# Patient Record
Sex: Male | Born: 1935 | Race: White | Hispanic: No | Smoking: Never smoker
Health system: Southern US, Community
[De-identification: ages and names within clinical notes are randomized; demographics above are authoritative.]

## PROBLEM LIST (undated history)

## (undated) DIAGNOSIS — L97509 Non-pressure chronic ulcer of other part of unspecified foot with unspecified severity: Secondary | ICD-10-CM

## (undated) DIAGNOSIS — D696 Thrombocytopenia, unspecified: Secondary | ICD-10-CM

## (undated) DIAGNOSIS — E039 Hypothyroidism, unspecified: Secondary | ICD-10-CM

## (undated) DIAGNOSIS — I4891 Unspecified atrial fibrillation: Secondary | ICD-10-CM

## (undated) DIAGNOSIS — I9789 Other postprocedural complications and disorders of the circulatory system, not elsewhere classified: Secondary | ICD-10-CM

## (undated) DIAGNOSIS — I509 Heart failure, unspecified: Secondary | ICD-10-CM

## (undated) DIAGNOSIS — R0600 Dyspnea, unspecified: Secondary | ICD-10-CM

## (undated) DIAGNOSIS — Z9289 Personal history of other medical treatment: Secondary | ICD-10-CM

## (undated) DIAGNOSIS — D649 Anemia, unspecified: Secondary | ICD-10-CM

## (undated) DIAGNOSIS — E785 Hyperlipidemia, unspecified: Secondary | ICD-10-CM

## (undated) DIAGNOSIS — N184 Chronic kidney disease, stage 4 (severe): Secondary | ICD-10-CM

## (undated) DIAGNOSIS — I2510 Atherosclerotic heart disease of native coronary artery without angina pectoris: Secondary | ICD-10-CM

## (undated) DIAGNOSIS — M1990 Unspecified osteoarthritis, unspecified site: Secondary | ICD-10-CM

## (undated) DIAGNOSIS — I779 Disorder of arteries and arterioles, unspecified: Secondary | ICD-10-CM

## (undated) DIAGNOSIS — I219 Acute myocardial infarction, unspecified: Secondary | ICD-10-CM

## (undated) DIAGNOSIS — I499 Cardiac arrhythmia, unspecified: Secondary | ICD-10-CM

## (undated) DIAGNOSIS — I10 Essential (primary) hypertension: Secondary | ICD-10-CM

## (undated) DIAGNOSIS — I739 Peripheral vascular disease, unspecified: Secondary | ICD-10-CM

## (undated) HISTORY — DX: Other postprocedural complications and disorders of the circulatory system, not elsewhere classified: I48.91

## (undated) HISTORY — DX: Essential (primary) hypertension: I10

## (undated) HISTORY — DX: Unspecified atrial fibrillation: I97.89

## (undated) HISTORY — DX: Peripheral vascular disease, unspecified: I73.9

## (undated) HISTORY — PX: OTHER SURGICAL HISTORY: SHX169

## (undated) HISTORY — DX: Thrombocytopenia, unspecified: D69.6

## (undated) HISTORY — DX: Hyperlipidemia, unspecified: E78.5

## (undated) HISTORY — DX: Non-pressure chronic ulcer of other part of unspecified foot with unspecified severity: L97.509

## (undated) HISTORY — DX: Disorder of arteries and arterioles, unspecified: I77.9

## (undated) HISTORY — DX: Atherosclerotic heart disease of native coronary artery without angina pectoris: I25.10

## (undated) HISTORY — DX: Chronic kidney disease, stage 4 (severe): N18.4

## (undated) HISTORY — DX: Anemia, unspecified: D64.9

---

## 2001-10-27 ENCOUNTER — Inpatient Hospital Stay (HOSPITAL_COMMUNITY): Admission: AD | Admit: 2001-10-27 | Discharge: 2001-10-28 | Payer: Self-pay | Admitting: Cardiology

## 2003-05-25 ENCOUNTER — Encounter: Payer: Self-pay | Admitting: Emergency Medicine

## 2003-05-25 ENCOUNTER — Inpatient Hospital Stay (HOSPITAL_COMMUNITY): Admission: EM | Admit: 2003-05-25 | Discharge: 2003-05-26 | Payer: Self-pay | Admitting: Emergency Medicine

## 2004-02-14 ENCOUNTER — Emergency Department (HOSPITAL_COMMUNITY): Admission: EM | Admit: 2004-02-14 | Discharge: 2004-02-14 | Payer: Self-pay | Admitting: Physical Therapy

## 2005-07-31 ENCOUNTER — Ambulatory Visit: Payer: Self-pay | Admitting: Cardiology

## 2007-04-22 ENCOUNTER — Ambulatory Visit: Payer: Self-pay | Admitting: Cardiology

## 2007-05-06 ENCOUNTER — Ambulatory Visit: Payer: Self-pay | Admitting: *Deleted

## 2007-06-03 ENCOUNTER — Ambulatory Visit: Payer: Self-pay | Admitting: *Deleted

## 2007-12-02 ENCOUNTER — Ambulatory Visit: Payer: Self-pay | Admitting: *Deleted

## 2007-12-24 ENCOUNTER — Inpatient Hospital Stay (HOSPITAL_COMMUNITY): Admission: RE | Admit: 2007-12-24 | Discharge: 2007-12-25 | Payer: Self-pay | Admitting: *Deleted

## 2007-12-24 ENCOUNTER — Ambulatory Visit: Payer: Self-pay | Admitting: *Deleted

## 2007-12-24 ENCOUNTER — Encounter (INDEPENDENT_AMBULATORY_CARE_PROVIDER_SITE_OTHER): Payer: Self-pay | Admitting: *Deleted

## 2008-01-06 ENCOUNTER — Ambulatory Visit: Payer: Self-pay | Admitting: *Deleted

## 2008-09-20 ENCOUNTER — Ambulatory Visit: Payer: Self-pay | Admitting: Cardiology

## 2008-12-15 DIAGNOSIS — I2510 Atherosclerotic heart disease of native coronary artery without angina pectoris: Secondary | ICD-10-CM

## 2008-12-15 DIAGNOSIS — I219 Acute myocardial infarction, unspecified: Secondary | ICD-10-CM

## 2008-12-15 HISTORY — PX: CORONARY ARTERY BYPASS GRAFT: SHX141

## 2008-12-15 HISTORY — DX: Atherosclerotic heart disease of native coronary artery without angina pectoris: I25.10

## 2008-12-15 HISTORY — DX: Acute myocardial infarction, unspecified: I21.9

## 2009-05-01 ENCOUNTER — Inpatient Hospital Stay (HOSPITAL_COMMUNITY): Admission: EM | Admit: 2009-05-01 | Discharge: 2009-05-13 | Payer: Self-pay | Admitting: Cardiology

## 2009-05-01 ENCOUNTER — Ambulatory Visit: Payer: Self-pay | Admitting: Cardiovascular Disease

## 2009-05-01 ENCOUNTER — Encounter: Payer: Self-pay | Admitting: Cardiology

## 2009-05-03 ENCOUNTER — Ambulatory Visit: Payer: Self-pay | Admitting: Cardiothoracic Surgery

## 2009-05-04 ENCOUNTER — Encounter: Payer: Self-pay | Admitting: Cardiothoracic Surgery

## 2009-05-08 ENCOUNTER — Encounter: Payer: Self-pay | Admitting: Cardiothoracic Surgery

## 2009-05-10 ENCOUNTER — Encounter: Payer: Self-pay | Admitting: Cardiothoracic Surgery

## 2009-06-01 ENCOUNTER — Ambulatory Visit: Payer: Self-pay | Admitting: Cardiothoracic Surgery

## 2009-06-01 ENCOUNTER — Encounter: Admission: RE | Admit: 2009-06-01 | Discharge: 2009-06-01 | Payer: Self-pay | Admitting: Cardiothoracic Surgery

## 2009-06-13 ENCOUNTER — Ambulatory Visit: Payer: Self-pay | Admitting: Cardiology

## 2009-06-14 ENCOUNTER — Encounter: Payer: Self-pay | Admitting: Cardiology

## 2009-06-15 ENCOUNTER — Ambulatory Visit: Payer: Self-pay | Admitting: Cardiothoracic Surgery

## 2009-08-08 ENCOUNTER — Encounter: Payer: Self-pay | Admitting: Physician Assistant

## 2009-08-13 ENCOUNTER — Encounter: Payer: Self-pay | Admitting: Physician Assistant

## 2009-08-14 ENCOUNTER — Telehealth: Payer: Self-pay | Admitting: Physician Assistant

## 2009-08-15 DIAGNOSIS — I4891 Unspecified atrial fibrillation: Secondary | ICD-10-CM

## 2009-08-15 DIAGNOSIS — I25810 Atherosclerosis of coronary artery bypass graft(s) without angina pectoris: Secondary | ICD-10-CM

## 2009-08-15 DIAGNOSIS — E785 Hyperlipidemia, unspecified: Secondary | ICD-10-CM

## 2009-08-15 DIAGNOSIS — I10 Essential (primary) hypertension: Secondary | ICD-10-CM | POA: Insufficient documentation

## 2009-08-19 ENCOUNTER — Encounter: Payer: Self-pay | Admitting: Cardiology

## 2009-08-19 ENCOUNTER — Inpatient Hospital Stay (HOSPITAL_COMMUNITY): Admission: EM | Admit: 2009-08-19 | Discharge: 2009-08-21 | Payer: Self-pay | Admitting: Internal Medicine

## 2009-08-19 ENCOUNTER — Ambulatory Visit: Payer: Self-pay | Admitting: Internal Medicine

## 2009-08-20 ENCOUNTER — Encounter: Payer: Self-pay | Admitting: Internal Medicine

## 2009-08-21 ENCOUNTER — Encounter: Payer: Self-pay | Admitting: Cardiology

## 2009-08-24 ENCOUNTER — Ambulatory Visit: Payer: Self-pay | Admitting: Cardiology

## 2009-08-24 DIAGNOSIS — E669 Obesity, unspecified: Secondary | ICD-10-CM | POA: Insufficient documentation

## 2009-08-29 ENCOUNTER — Encounter: Payer: Self-pay | Admitting: Cardiology

## 2009-09-10 ENCOUNTER — Encounter: Payer: Self-pay | Admitting: Cardiology

## 2009-09-14 ENCOUNTER — Encounter (INDEPENDENT_AMBULATORY_CARE_PROVIDER_SITE_OTHER): Payer: Self-pay | Admitting: *Deleted

## 2009-09-17 ENCOUNTER — Encounter: Payer: Self-pay | Admitting: Cardiology

## 2009-09-18 ENCOUNTER — Encounter: Payer: Self-pay | Admitting: Cardiology

## 2009-09-20 ENCOUNTER — Encounter: Payer: Self-pay | Admitting: Cardiology

## 2009-09-24 ENCOUNTER — Encounter: Payer: Self-pay | Admitting: Cardiology

## 2009-09-25 ENCOUNTER — Ambulatory Visit: Payer: Self-pay | Admitting: Cardiology

## 2009-10-08 ENCOUNTER — Encounter: Payer: Self-pay | Admitting: Cardiology

## 2009-10-25 ENCOUNTER — Encounter: Payer: Self-pay | Admitting: Cardiology

## 2009-10-26 ENCOUNTER — Encounter: Payer: Self-pay | Admitting: Cardiology

## 2009-12-29 ENCOUNTER — Encounter: Payer: Self-pay | Admitting: Cardiology

## 2009-12-30 ENCOUNTER — Encounter: Payer: Self-pay | Admitting: Cardiology

## 2010-04-16 IMAGING — CR DG CHEST 2V
2 series · 2 of 2 positions shown · non-contrast
Comparison: 05/09/2009

CLINICAL DATA: Postop CABG procedure

CHEST - 2 VIEW

[w chest pa]
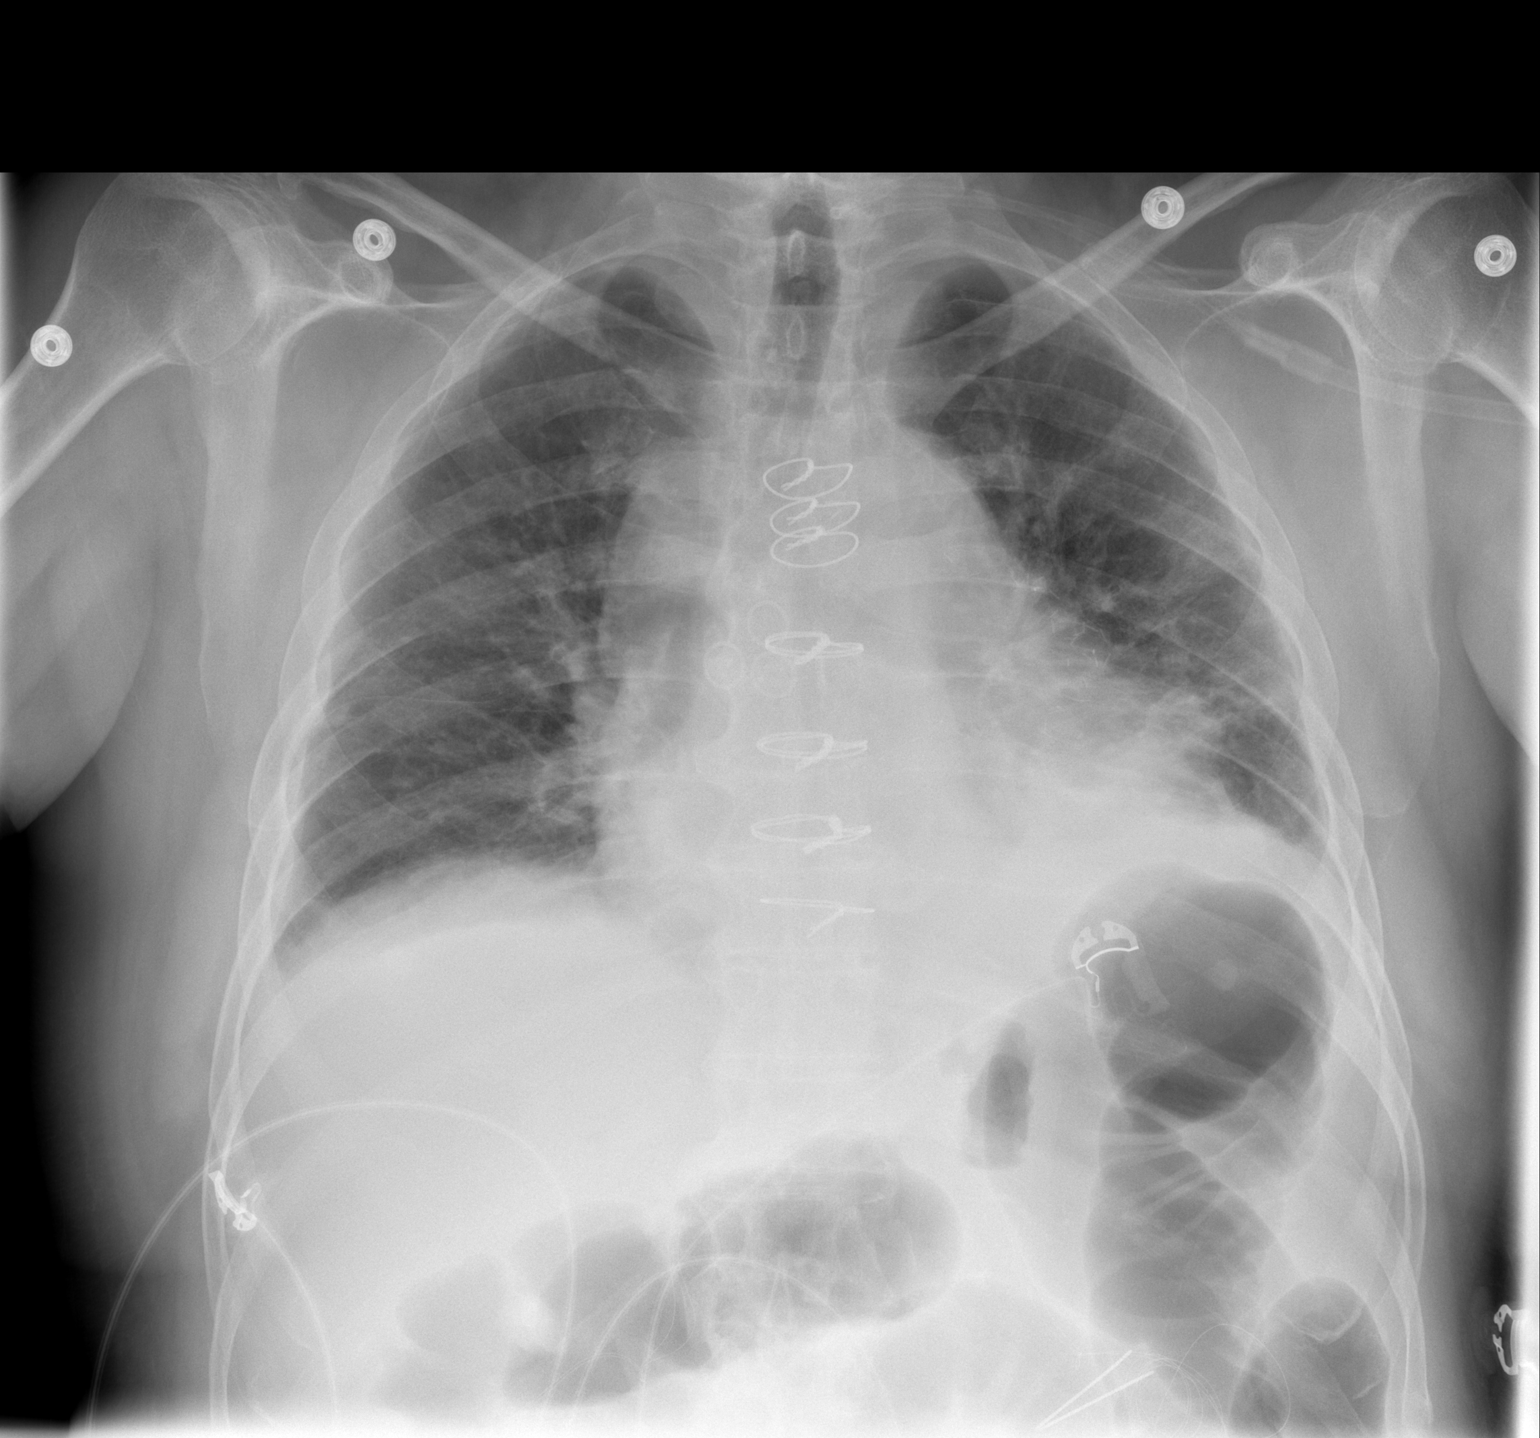

[w chest lat]
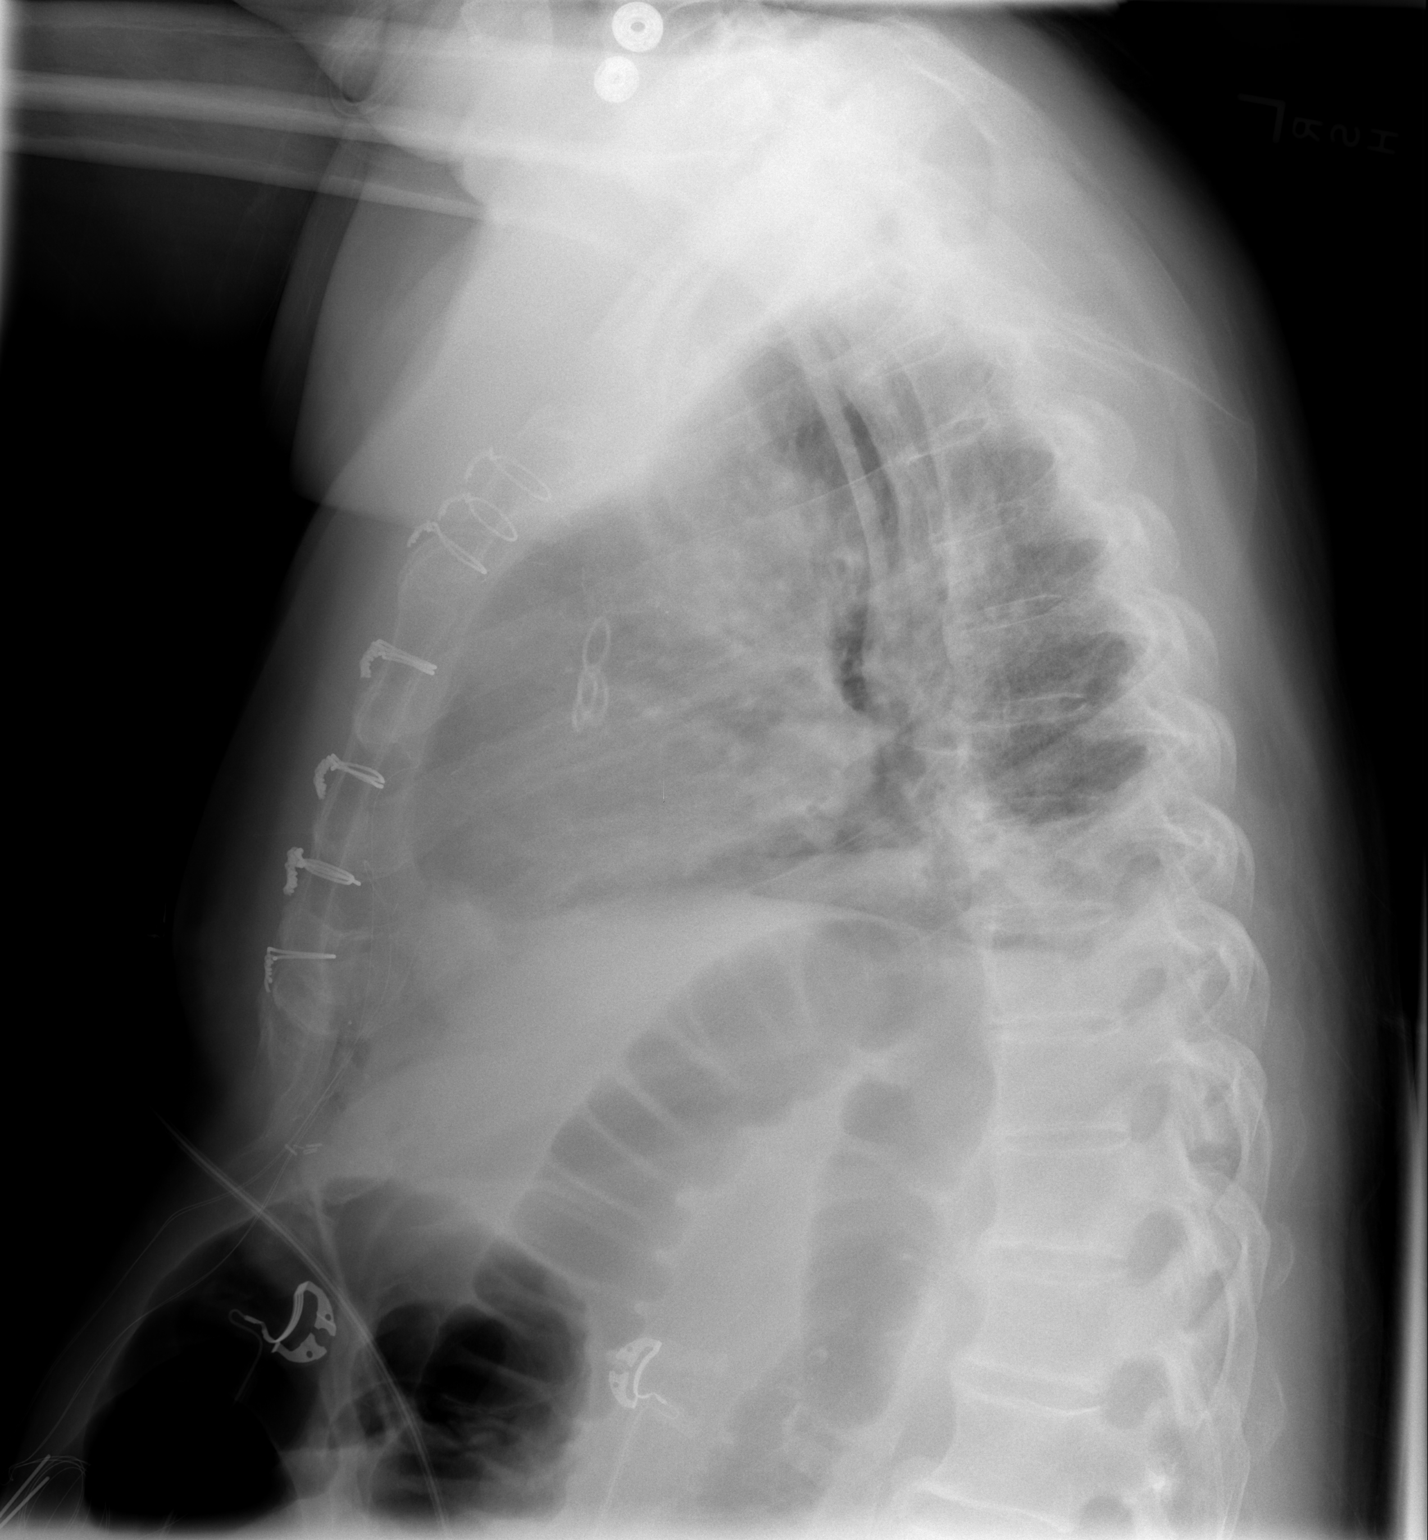

[2 of 2 positions shown; findings below may reference images not displayed]

FINDINGS: Improved aeration with decreased atelectasis.  Negative
for pneumothorax.  Cardiac enlargement without change
IMPRESSION: Improved aeration.

## 2010-04-18 IMAGING — CR DG CHEST 2V
2 series · 2 of 2 positions shown · non-contrast
Comparison: 05/10/2009

CLINICAL DATA: Chest pain

CHEST - 2 VIEW

[w chest pa]
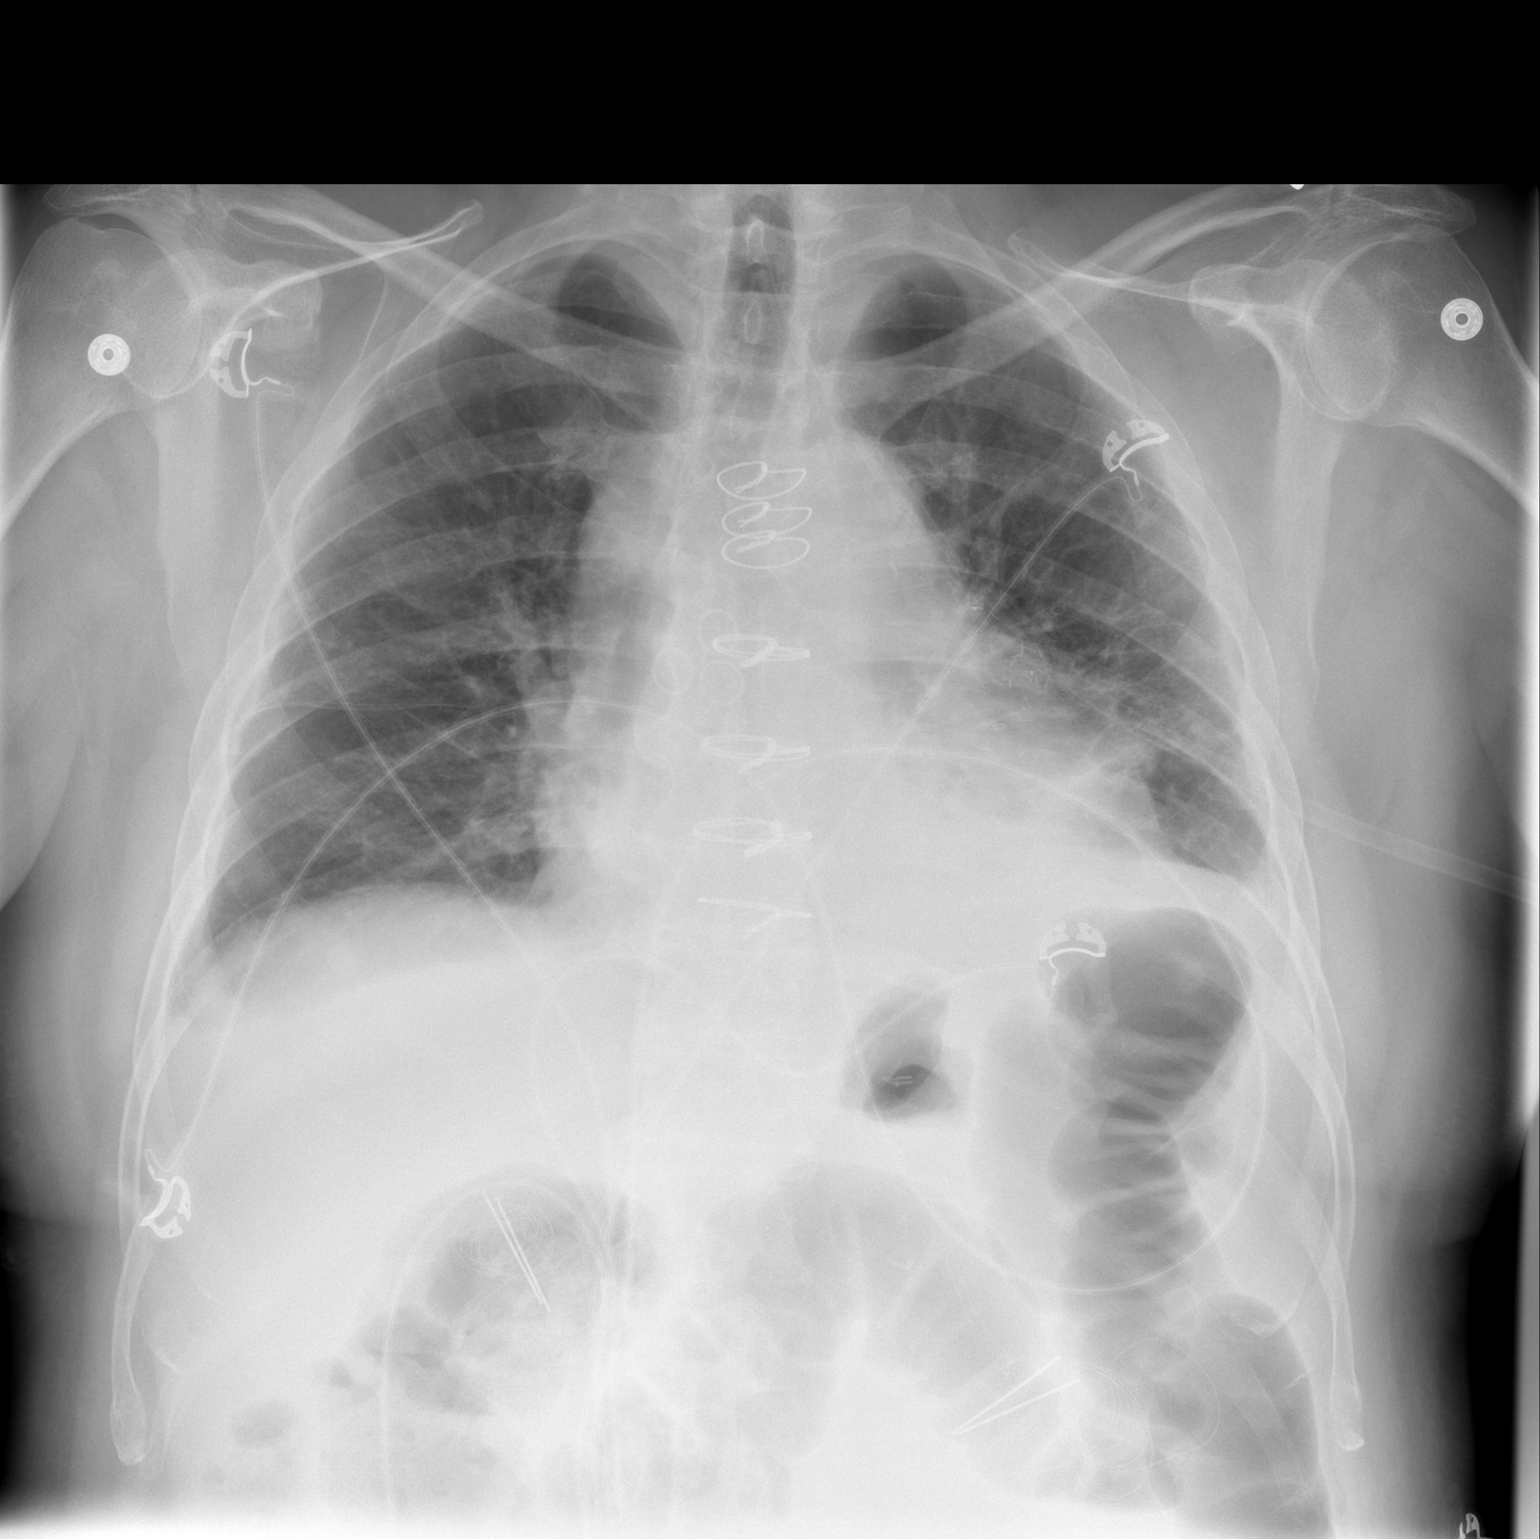

[w chest lat]
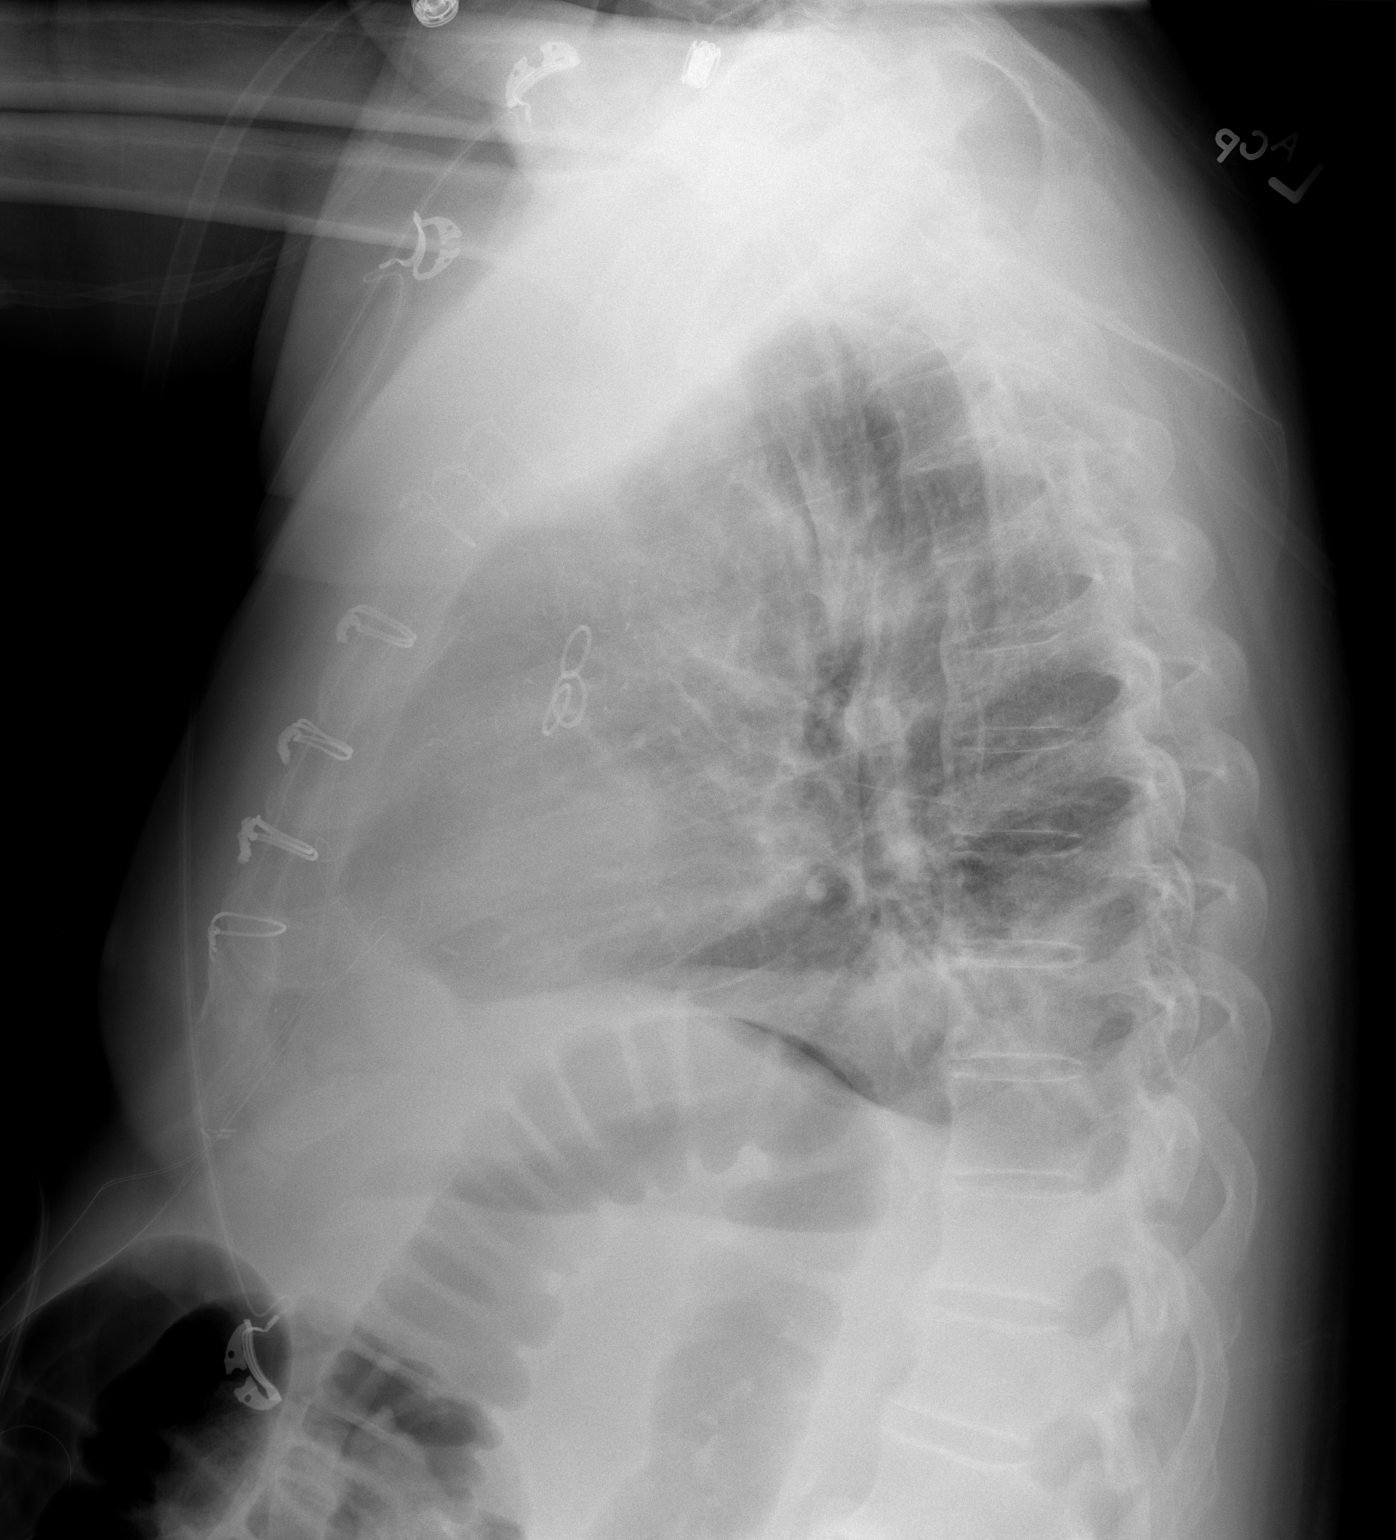

[2 of 2 positions shown; findings below may reference images not displayed]

FINDINGS: Left lower lobe consolidation is stable.  Lungs under
aerated.  Heart is normal in size.  No pneumothorax. Small
bilateral effusions.
IMPRESSION: Stable left lower lobe consolidation.

## 2011-01-05 ENCOUNTER — Encounter: Payer: Self-pay | Admitting: *Deleted

## 2011-01-14 NOTE — Letter (Signed)
Summary: [place] D/C DR. Langley Gauss  [facility] D/C DR. Langley Gauss   Imported By: Delfino Lovett 01/01/2010 15:39:32  _____________________________________________________________________  External Attachment:    Type:   Image     Comment:   External Document

## 2011-03-21 LAB — COMPREHENSIVE METABOLIC PANEL
ALT: 12 U/L (ref 0–53)
AST: 19 U/L (ref 0–37)
CO2: 24 mEq/L (ref 19–32)
Calcium: 9.1 mg/dL (ref 8.4–10.5)
Creatinine, Ser: 1.62 mg/dL — ABNORMAL HIGH (ref 0.4–1.5)
GFR calc Af Amer: 51 mL/min — ABNORMAL LOW (ref >60)
GFR calc non Af Amer: 42 mL/min — ABNORMAL LOW (ref >60)
Glucose, Bld: 219 mg/dL — ABNORMAL HIGH (ref 70–99)
Sodium: 141 mEq/L (ref 135–145)
Total Protein: 6.7 g/dL (ref 6.0–8.3)

## 2011-03-21 LAB — GLUCOSE, CAPILLARY
Glucose-Capillary: 186 mg/dL — ABNORMAL HIGH (ref 70–99)
Glucose-Capillary: 230 mg/dL — ABNORMAL HIGH (ref 70–99)
Glucose-Capillary: 278 mg/dL — ABNORMAL HIGH (ref 70–99)
Glucose-Capillary: 280 mg/dL — ABNORMAL HIGH (ref 70–99)

## 2011-03-21 LAB — CBC
Hemoglobin: 11.5 g/dL — ABNORMAL LOW (ref 13.0–17.0)
MCHC: 33.4 g/dL (ref 30.0–36.0)
MCV: 93.5 fL (ref 78.0–100.0)
RBC: 3.81 MIL/uL — ABNORMAL LOW (ref 4.22–5.81)
RDW: 14.8 % (ref 11.5–15.5)
RDW: 15.1 % (ref 11.5–15.5)

## 2011-03-21 LAB — PROTIME-INR: INR: 1.1 (ref 0.00–1.49)

## 2011-03-21 LAB — DIFFERENTIAL
Lymphocytes Relative: 26 % (ref 12–46)
Lymphs Abs: 1.8 10*3/uL (ref 0.7–4.0)
Monocytes Relative: 7 % (ref 3–12)
Neutrophils Relative %: 65 % (ref 43–77)

## 2011-03-21 LAB — LIPID PANEL
Total CHOL/HDL Ratio: 3.5 RATIO
VLDL: 27 mg/dL (ref 0–40)

## 2011-03-21 LAB — PHOSPHORUS: Phosphorus: 3.9 mg/dL (ref 2.3–4.6)

## 2011-03-21 LAB — MAGNESIUM
Magnesium: 2.4 mg/dL (ref 1.5–2.5)
Magnesium: 2.8 mg/dL — ABNORMAL HIGH (ref 1.5–2.5)

## 2011-03-21 LAB — BASIC METABOLIC PANEL
CO2: 28 mEq/L (ref 19–32)
GFR calc non Af Amer: 46 mL/min — ABNORMAL LOW (ref >60)
Glucose, Bld: 206 mg/dL — ABNORMAL HIGH (ref 70–99)
Potassium: 4.2 mEq/L (ref 3.5–5.1)
Sodium: 139 mEq/L (ref 135–145)

## 2011-03-21 LAB — CARDIAC PANEL(CRET KIN+CKTOT+MB+TROPI)
CK, MB: 2.3 ng/mL (ref 0.3–4.0)
Relative Index: 1.6 (ref 0.0–2.5)

## 2011-03-21 LAB — CALCIUM: Calcium: 8.8 mg/dL (ref 8.4–10.5)

## 2011-03-21 LAB — HEMOGLOBIN A1C
Hgb A1c MFr Bld: 8.7 % — ABNORMAL HIGH (ref 4.6–6.1)
Mean Plasma Glucose: 203 mg/dL

## 2011-03-25 LAB — CBC
HCT: 22.2 % — ABNORMAL LOW (ref 39.0–52.0)
HCT: 25 % — ABNORMAL LOW (ref 39.0–52.0)
HCT: 27.3 % — ABNORMAL LOW (ref 39.0–52.0)
HCT: 27.4 % — ABNORMAL LOW (ref 39.0–52.0)
HCT: 27.6 % — ABNORMAL LOW (ref 39.0–52.0)
HCT: 28.3 % — ABNORMAL LOW (ref 39.0–52.0)
HCT: 28.9 % — ABNORMAL LOW (ref 39.0–52.0)
HCT: 32.8 % — ABNORMAL LOW (ref 39.0–52.0)
HCT: 33.5 % — ABNORMAL LOW (ref 39.0–52.0)
HCT: 33.6 % — ABNORMAL LOW (ref 39.0–52.0)
HCT: 34.5 % — ABNORMAL LOW (ref 39.0–52.0)
HCT: 35.5 % — ABNORMAL LOW (ref 39.0–52.0)
HCT: 36.9 % — ABNORMAL LOW (ref 39.0–52.0)
Hemoglobin: 11.5 g/dL — ABNORMAL LOW (ref 13.0–17.0)
Hemoglobin: 11.9 g/dL — ABNORMAL LOW (ref 13.0–17.0)
Hemoglobin: 12 g/dL — ABNORMAL LOW (ref 13.0–17.0)
Hemoglobin: 12.4 g/dL — ABNORMAL LOW (ref 13.0–17.0)
Hemoglobin: 7.8 g/dL — CL (ref 13.0–17.0)
Hemoglobin: 8.8 g/dL — ABNORMAL LOW (ref 13.0–17.0)
Hemoglobin: 9.1 g/dL — ABNORMAL LOW (ref 13.0–17.0)
Hemoglobin: 9.3 g/dL — ABNORMAL LOW (ref 13.0–17.0)
Hemoglobin: 9.4 g/dL — ABNORMAL LOW (ref 13.0–17.0)
Hemoglobin: 9.6 g/dL — ABNORMAL LOW (ref 13.0–17.0)
Hemoglobin: 9.8 g/dL — ABNORMAL LOW (ref 13.0–17.0)
MCHC: 33.5 g/dL (ref 30.0–36.0)
MCHC: 33.8 g/dL (ref 30.0–36.0)
MCHC: 33.9 g/dL (ref 30.0–36.0)
MCHC: 33.9 g/dL (ref 30.0–36.0)
MCHC: 33.9 g/dL (ref 30.0–36.0)
MCHC: 34.1 g/dL (ref 30.0–36.0)
MCHC: 34.3 g/dL (ref 30.0–36.0)
MCHC: 34.7 g/dL (ref 30.0–36.0)
MCHC: 34.9 g/dL (ref 30.0–36.0)
MCHC: 35 g/dL (ref 30.0–36.0)
MCHC: 35 g/dL (ref 30.0–36.0)
MCHC: 35 g/dL (ref 30.0–36.0)
MCHC: 35 g/dL (ref 30.0–36.0)
MCV: 92.3 fL (ref 78.0–100.0)
MCV: 92.4 fL (ref 78.0–100.0)
MCV: 92.8 fL (ref 78.0–100.0)
MCV: 92.8 fL (ref 78.0–100.0)
MCV: 92.9 fL (ref 78.0–100.0)
MCV: 92.9 fL (ref 78.0–100.0)
MCV: 92.9 fL (ref 78.0–100.0)
MCV: 93 fL (ref 78.0–100.0)
MCV: 93.2 fL (ref 78.0–100.0)
MCV: 93.2 fL (ref 78.0–100.0)
MCV: 93.5 fL (ref 78.0–100.0)
MCV: 93.6 fL (ref 78.0–100.0)
MCV: 93.9 fL (ref 78.0–100.0)
MCV: 94.1 fL (ref 78.0–100.0)
Platelets: 104 10*3/uL — ABNORMAL LOW (ref 150–400)
Platelets: 107 10*3/uL — ABNORMAL LOW (ref 150–400)
Platelets: 113 10*3/uL — ABNORMAL LOW (ref 150–400)
Platelets: 118 10*3/uL — ABNORMAL LOW (ref 150–400)
Platelets: 123 10*3/uL — ABNORMAL LOW (ref 150–400)
Platelets: 128 10*3/uL — ABNORMAL LOW (ref 150–400)
Platelets: 142 10*3/uL — ABNORMAL LOW (ref 150–400)
Platelets: 79 10*3/uL — ABNORMAL LOW (ref 150–400)
Platelets: 93 10*3/uL — ABNORMAL LOW (ref 150–400)
Platelets: 97 10*3/uL — ABNORMAL LOW (ref 150–400)
Platelets: 98 10*3/uL — ABNORMAL LOW (ref 150–400)
RBC: 2.39 MIL/uL — ABNORMAL LOW (ref 4.22–5.81)
RBC: 2.69 MIL/uL — ABNORMAL LOW (ref 4.22–5.81)
RBC: 2.94 MIL/uL — ABNORMAL LOW (ref 4.22–5.81)
RBC: 2.95 MIL/uL — ABNORMAL LOW (ref 4.22–5.81)
RBC: 2.99 MIL/uL — ABNORMAL LOW (ref 4.22–5.81)
RBC: 3.02 MIL/uL — ABNORMAL LOW (ref 4.22–5.81)
RBC: 3.07 MIL/uL — ABNORMAL LOW (ref 4.22–5.81)
RBC: 3.54 MIL/uL — ABNORMAL LOW (ref 4.22–5.81)
RBC: 3.64 MIL/uL — ABNORMAL LOW (ref 4.22–5.81)
RBC: 3.71 MIL/uL — ABNORMAL LOW (ref 4.22–5.81)
RBC: 3.72 MIL/uL — ABNORMAL LOW (ref 4.22–5.81)
RBC: 3.95 MIL/uL — ABNORMAL LOW (ref 4.22–5.81)
RDW: 12.5 % (ref 11.5–15.5)
RDW: 12.8 % (ref 11.5–15.5)
RDW: 12.9 % (ref 11.5–15.5)
RDW: 12.9 % (ref 11.5–15.5)
RDW: 13.2 % (ref 11.5–15.5)
RDW: 13.4 % (ref 11.5–15.5)
RDW: 13.4 % (ref 11.5–15.5)
RDW: 13.6 % (ref 11.5–15.5)
RDW: 13.9 % (ref 11.5–15.5)
RDW: 14 % (ref 11.5–15.5)
WBC: 4.5 10*3/uL (ref 4.0–10.5)
WBC: 4.7 10*3/uL (ref 4.0–10.5)
WBC: 5 10*3/uL (ref 4.0–10.5)
WBC: 5.5 10*3/uL (ref 4.0–10.5)
WBC: 6.2 10*3/uL (ref 4.0–10.5)
WBC: 7.3 10*3/uL (ref 4.0–10.5)
WBC: 7.4 10*3/uL (ref 4.0–10.5)
WBC: 7.7 10*3/uL (ref 4.0–10.5)
WBC: 7.9 10*3/uL (ref 4.0–10.5)
WBC: 9.7 10*3/uL (ref 4.0–10.5)
WBC: 9.8 10*3/uL (ref 4.0–10.5)

## 2011-03-25 LAB — POCT I-STAT 4, (NA,K, GLUC, HGB,HCT)
Glucose, Bld: 148 mg/dL — ABNORMAL HIGH (ref 70–99)
HCT: 20 % — ABNORMAL LOW (ref 39.0–52.0)
HCT: 26 % — ABNORMAL LOW (ref 39.0–52.0)
HCT: 32 % — ABNORMAL LOW (ref 39.0–52.0)
HCT: 35 % — ABNORMAL LOW (ref 39.0–52.0)
Hemoglobin: 6.8 g/dL — CL (ref 13.0–17.0)
Hemoglobin: 7.1 g/dL — CL (ref 13.0–17.0)
Hemoglobin: 7.8 g/dL — CL (ref 13.0–17.0)
Hemoglobin: 8.8 g/dL — ABNORMAL LOW (ref 13.0–17.0)
Potassium: 3.6 mEq/L (ref 3.5–5.1)
Potassium: 3.7 mEq/L (ref 3.5–5.1)
Potassium: 4.1 mEq/L (ref 3.5–5.1)
Potassium: 4.5 mEq/L (ref 3.5–5.1)
Sodium: 139 mEq/L (ref 135–145)
Sodium: 140 mEq/L (ref 135–145)
Sodium: 141 mEq/L (ref 135–145)

## 2011-03-25 LAB — GLUCOSE, CAPILLARY
Glucose-Capillary: 100 mg/dL — ABNORMAL HIGH (ref 70–99)
Glucose-Capillary: 101 mg/dL — ABNORMAL HIGH (ref 70–99)
Glucose-Capillary: 104 mg/dL — ABNORMAL HIGH (ref 70–99)
Glucose-Capillary: 112 mg/dL — ABNORMAL HIGH (ref 70–99)
Glucose-Capillary: 121 mg/dL — ABNORMAL HIGH (ref 70–99)
Glucose-Capillary: 122 mg/dL — ABNORMAL HIGH (ref 70–99)
Glucose-Capillary: 123 mg/dL — ABNORMAL HIGH (ref 70–99)
Glucose-Capillary: 126 mg/dL — ABNORMAL HIGH (ref 70–99)
Glucose-Capillary: 129 mg/dL — ABNORMAL HIGH (ref 70–99)
Glucose-Capillary: 139 mg/dL — ABNORMAL HIGH (ref 70–99)
Glucose-Capillary: 140 mg/dL — ABNORMAL HIGH (ref 70–99)
Glucose-Capillary: 144 mg/dL — ABNORMAL HIGH (ref 70–99)
Glucose-Capillary: 156 mg/dL — ABNORMAL HIGH (ref 70–99)
Glucose-Capillary: 174 mg/dL — ABNORMAL HIGH (ref 70–99)
Glucose-Capillary: 181 mg/dL — ABNORMAL HIGH (ref 70–99)
Glucose-Capillary: 182 mg/dL — ABNORMAL HIGH (ref 70–99)
Glucose-Capillary: 203 mg/dL — ABNORMAL HIGH (ref 70–99)
Glucose-Capillary: 207 mg/dL — ABNORMAL HIGH (ref 70–99)
Glucose-Capillary: 209 mg/dL — ABNORMAL HIGH (ref 70–99)
Glucose-Capillary: 216 mg/dL — ABNORMAL HIGH (ref 70–99)
Glucose-Capillary: 219 mg/dL — ABNORMAL HIGH (ref 70–99)
Glucose-Capillary: 225 mg/dL — ABNORMAL HIGH (ref 70–99)
Glucose-Capillary: 229 mg/dL — ABNORMAL HIGH (ref 70–99)
Glucose-Capillary: 248 mg/dL — ABNORMAL HIGH (ref 70–99)
Glucose-Capillary: 261 mg/dL — ABNORMAL HIGH (ref 70–99)
Glucose-Capillary: 296 mg/dL — ABNORMAL HIGH (ref 70–99)
Glucose-Capillary: 57 mg/dL — ABNORMAL LOW (ref 70–99)
Glucose-Capillary: 98 mg/dL (ref 70–99)
Glucose-Capillary: 99 mg/dL (ref 70–99)

## 2011-03-25 LAB — POCT I-STAT 3, ART BLOOD GAS (G3+)
Acid-base deficit: 2 mmol/L (ref 0.0–2.0)
Acid-base deficit: 3 mmol/L — ABNORMAL HIGH (ref 0.0–2.0)
Acid-base deficit: 3 mmol/L — ABNORMAL HIGH (ref 0.0–2.0)
Acid-base deficit: 6 mmol/L — ABNORMAL HIGH (ref 0.0–2.0)
Acid-base deficit: 7 mmol/L — ABNORMAL HIGH (ref 0.0–2.0)
Bicarbonate: 19.6 mEq/L — ABNORMAL LOW (ref 20.0–24.0)
Bicarbonate: 20.4 mEq/L (ref 20.0–24.0)
Bicarbonate: 23 mEq/L (ref 20.0–24.0)
O2 Saturation: 100 %
O2 Saturation: 100 %
O2 Saturation: 98 %
O2 Saturation: 98 %
O2 Saturation: 98 %
Patient temperature: 35.8
Patient temperature: 36.3
TCO2: 21 mmol/L (ref 0–100)
TCO2: 22 mmol/L (ref 0–100)
TCO2: 23 mmol/L (ref 0–100)
pCO2 arterial: 39 mmHg (ref 35.0–45.0)
pCO2 arterial: 45.9 mmHg — ABNORMAL HIGH (ref 35.0–45.0)
pH, Arterial: 7.425 (ref 7.350–7.450)
pO2, Arterial: 112 mmHg — ABNORMAL HIGH (ref 80.0–100.0)
pO2, Arterial: 124 mmHg — ABNORMAL HIGH (ref 80.0–100.0)
pO2, Arterial: 280 mmHg — ABNORMAL HIGH (ref 80.0–100.0)
pO2, Arterial: 378 mmHg — ABNORMAL HIGH (ref 80.0–100.0)

## 2011-03-25 LAB — MAGNESIUM
Magnesium: 2.9 mg/dL — ABNORMAL HIGH (ref 1.5–2.5)
Magnesium: 2.9 mg/dL — ABNORMAL HIGH (ref 1.5–2.5)
Magnesium: 2.9 mg/dL — ABNORMAL HIGH (ref 1.5–2.5)

## 2011-03-25 LAB — POCT I-STAT, CHEM 8
Calcium, Ion: 1.15 mmol/L (ref 1.12–1.32)
Chloride: 103 mEq/L (ref 96–112)
Chloride: 107 mEq/L (ref 96–112)
Glucose, Bld: 159 mg/dL — ABNORMAL HIGH (ref 70–99)
Glucose, Bld: 167 mg/dL — ABNORMAL HIGH (ref 70–99)
HCT: 24 % — ABNORMAL LOW (ref 39.0–52.0)
HCT: 28 % — ABNORMAL LOW (ref 39.0–52.0)
Hemoglobin: 9.5 g/dL — ABNORMAL LOW (ref 13.0–17.0)
Potassium: 4.7 mEq/L (ref 3.5–5.1)

## 2011-03-25 LAB — BASIC METABOLIC PANEL
BUN: 19 mg/dL (ref 6–23)
BUN: 20 mg/dL (ref 6–23)
BUN: 23 mg/dL (ref 6–23)
BUN: 23 mg/dL (ref 6–23)
BUN: 30 mg/dL — ABNORMAL HIGH (ref 6–23)
BUN: 31 mg/dL — ABNORMAL HIGH (ref 6–23)
BUN: 39 mg/dL — ABNORMAL HIGH (ref 6–23)
BUN: 39 mg/dL — ABNORMAL HIGH (ref 6–23)
CO2: 22 mEq/L (ref 19–32)
CO2: 23 mEq/L (ref 19–32)
CO2: 24 mEq/L (ref 19–32)
CO2: 24 mEq/L (ref 19–32)
CO2: 25 mEq/L (ref 19–32)
CO2: 25 mEq/L (ref 19–32)
CO2: 26 mEq/L (ref 19–32)
CO2: 27 mEq/L (ref 19–32)
CO2: 27 mEq/L (ref 19–32)
CO2: 28 mEq/L (ref 19–32)
Calcium: 7.6 mg/dL — ABNORMAL LOW (ref 8.4–10.5)
Calcium: 7.9 mg/dL — ABNORMAL LOW (ref 8.4–10.5)
Calcium: 8.1 mg/dL — ABNORMAL LOW (ref 8.4–10.5)
Calcium: 8.3 mg/dL — ABNORMAL LOW (ref 8.4–10.5)
Calcium: 8.3 mg/dL — ABNORMAL LOW (ref 8.4–10.5)
Calcium: 8.4 mg/dL (ref 8.4–10.5)
Calcium: 9.3 mg/dL (ref 8.4–10.5)
Chloride: 102 mEq/L (ref 96–112)
Chloride: 104 mEq/L (ref 96–112)
Chloride: 106 mEq/L (ref 96–112)
Chloride: 106 mEq/L (ref 96–112)
Chloride: 106 mEq/L (ref 96–112)
Chloride: 107 mEq/L (ref 96–112)
Chloride: 108 mEq/L (ref 96–112)
Chloride: 110 mEq/L (ref 96–112)
Chloride: 110 mEq/L (ref 96–112)
Chloride: 97 mEq/L (ref 96–112)
Creatinine, Ser: 1.53 mg/dL — ABNORMAL HIGH (ref 0.4–1.5)
Creatinine, Ser: 1.55 mg/dL — ABNORMAL HIGH (ref 0.4–1.5)
Creatinine, Ser: 1.64 mg/dL — ABNORMAL HIGH (ref 0.4–1.5)
Creatinine, Ser: 1.67 mg/dL — ABNORMAL HIGH (ref 0.4–1.5)
Creatinine, Ser: 1.74 mg/dL — ABNORMAL HIGH (ref 0.4–1.5)
Creatinine, Ser: 1.92 mg/dL — ABNORMAL HIGH (ref 0.4–1.5)
Creatinine, Ser: 2.04 mg/dL — ABNORMAL HIGH (ref 0.4–1.5)
Creatinine, Ser: 2.06 mg/dL — ABNORMAL HIGH (ref 0.4–1.5)
GFR calc Af Amer: 39 mL/min — ABNORMAL LOW (ref >60)
GFR calc Af Amer: 39 mL/min — ABNORMAL LOW (ref >60)
GFR calc Af Amer: 42 mL/min — ABNORMAL LOW (ref >60)
GFR calc Af Amer: 47 mL/min — ABNORMAL LOW (ref >60)
GFR calc Af Amer: 49 mL/min — ABNORMAL LOW (ref >60)
GFR calc Af Amer: 50 mL/min — ABNORMAL LOW (ref >60)
GFR calc Af Amer: 55 mL/min — ABNORMAL LOW (ref >60)
GFR calc Af Amer: 57 mL/min — ABNORMAL LOW (ref >60)
GFR calc Af Amer: >60 mL/min (ref >60)
GFR calc non Af Amer: 32 mL/min — ABNORMAL LOW (ref >60)
GFR calc non Af Amer: 32 mL/min — ABNORMAL LOW (ref >60)
GFR calc non Af Amer: 35 mL/min — ABNORMAL LOW (ref >60)
GFR calc non Af Amer: 39 mL/min — ABNORMAL LOW (ref >60)
GFR calc non Af Amer: 41 mL/min — ABNORMAL LOW (ref >60)
GFR calc non Af Amer: 42 mL/min — ABNORMAL LOW (ref >60)
GFR calc non Af Amer: 49 mL/min — ABNORMAL LOW (ref >60)
GFR calc non Af Amer: 53 mL/min — ABNORMAL LOW (ref >60)
Glucose, Bld: 115 mg/dL — ABNORMAL HIGH (ref 70–99)
Glucose, Bld: 130 mg/dL — ABNORMAL HIGH (ref 70–99)
Glucose, Bld: 130 mg/dL — ABNORMAL HIGH (ref 70–99)
Glucose, Bld: 135 mg/dL — ABNORMAL HIGH (ref 70–99)
Glucose, Bld: 137 mg/dL — ABNORMAL HIGH (ref 70–99)
Glucose, Bld: 153 mg/dL — ABNORMAL HIGH (ref 70–99)
Glucose, Bld: 175 mg/dL — ABNORMAL HIGH (ref 70–99)
Glucose, Bld: 240 mg/dL — ABNORMAL HIGH (ref 70–99)
Potassium: 3.5 mEq/L (ref 3.5–5.1)
Potassium: 3.6 mEq/L (ref 3.5–5.1)
Potassium: 3.7 mEq/L (ref 3.5–5.1)
Potassium: 3.8 mEq/L (ref 3.5–5.1)
Potassium: 3.9 mEq/L (ref 3.5–5.1)
Potassium: 4 mEq/L (ref 3.5–5.1)
Potassium: 4.1 mEq/L (ref 3.5–5.1)
Potassium: 4.2 mEq/L (ref 3.5–5.1)
Potassium: 4.2 mEq/L (ref 3.5–5.1)
Potassium: 4.4 mEq/L (ref 3.5–5.1)
Potassium: 5 mEq/L (ref 3.5–5.1)
Sodium: 131 mEq/L — ABNORMAL LOW (ref 135–145)
Sodium: 132 mEq/L — ABNORMAL LOW (ref 135–145)
Sodium: 138 mEq/L (ref 135–145)
Sodium: 139 mEq/L (ref 135–145)
Sodium: 139 mEq/L (ref 135–145)
Sodium: 140 mEq/L (ref 135–145)
Sodium: 140 mEq/L (ref 135–145)
Sodium: 140 mEq/L (ref 135–145)
Sodium: 142 mEq/L (ref 135–145)

## 2011-03-25 LAB — CREATININE, SERUM
Creatinine, Ser: 1.4 mg/dL (ref 0.4–1.5)
Creatinine, Ser: 1.96 mg/dL — ABNORMAL HIGH (ref 0.4–1.5)
GFR calc Af Amer: 41 mL/min — ABNORMAL LOW (ref >60)
GFR calc Af Amer: >60 mL/min (ref >60)
GFR calc non Af Amer: 34 mL/min — ABNORMAL LOW (ref >60)
GFR calc non Af Amer: 50 mL/min — ABNORMAL LOW (ref >60)

## 2011-03-25 LAB — COMPREHENSIVE METABOLIC PANEL
ALT: 30 U/L (ref 0–53)
AST: 32 U/L (ref 0–37)
Albumin: 3 g/dL — ABNORMAL LOW (ref 3.5–5.2)
Alkaline Phosphatase: 61 U/L (ref 39–117)
BUN: 25 mg/dL — ABNORMAL HIGH (ref 6–23)
BUN: 36 mg/dL — ABNORMAL HIGH (ref 6–23)
CO2: 25 mEq/L (ref 19–32)
CO2: 27 mEq/L (ref 19–32)
Calcium: 8.4 mg/dL (ref 8.4–10.5)
Chloride: 107 mEq/L (ref 96–112)
Chloride: 99 mEq/L (ref 96–112)
Creatinine, Ser: 1.56 mg/dL — ABNORMAL HIGH (ref 0.4–1.5)
Creatinine, Ser: 1.85 mg/dL — ABNORMAL HIGH (ref 0.4–1.5)
GFR calc Af Amer: 44 mL/min — ABNORMAL LOW (ref >60)
GFR calc non Af Amer: 36 mL/min — ABNORMAL LOW (ref >60)
GFR calc non Af Amer: 44 mL/min — ABNORMAL LOW (ref >60)
Glucose, Bld: 58 mg/dL — ABNORMAL LOW (ref 70–99)
Potassium: 3.1 mEq/L — ABNORMAL LOW (ref 3.5–5.1)
Sodium: 138 mEq/L (ref 135–145)
Total Bilirubin: 0.5 mg/dL (ref 0.3–1.2)
Total Bilirubin: 0.6 mg/dL (ref 0.3–1.2)
Total Protein: 6 g/dL (ref 6.0–8.3)

## 2011-03-25 LAB — URINALYSIS, MICROSCOPIC ONLY
Bilirubin Urine: NEGATIVE
Glucose, UA: NEGATIVE mg/dL
Ketones, ur: 15 mg/dL — AB
Leukocytes, UA: NEGATIVE
Nitrite: NEGATIVE
Protein, ur: 100 mg/dL — AB
Specific Gravity, Urine: 1.021 (ref 1.005–1.030)
Urobilinogen, UA: 1 mg/dL (ref 0.0–1.0)
pH: 5.5 (ref 5.0–8.0)

## 2011-03-25 LAB — BLOOD GAS, ARTERIAL
Acid-base deficit: 0.5 mmol/L (ref 0.0–2.0)
Bicarbonate: 23.8 mEq/L (ref 20.0–24.0)
Drawn by: 23604
FIO2: 0.21 %
O2 Saturation: 95.8 %
Patient temperature: 98.6
TCO2: 25 mmol/L (ref 0–100)
pCO2 arterial: 39.8 mmHg (ref 35.0–45.0)
pH, Arterial: 7.393 (ref 7.350–7.450)
pO2, Arterial: 77.3 mmHg — ABNORMAL LOW (ref 80.0–100.0)

## 2011-03-25 LAB — POCT I-STAT 3, VENOUS BLOOD GAS (G3P V)
Bicarbonate: 21.7 mEq/L (ref 20.0–24.0)
TCO2: 23 mmol/L (ref 0–100)
pCO2, Ven: 43 mmHg — ABNORMAL LOW (ref 45.0–50.0)
pH, Ven: 7.311 — ABNORMAL HIGH (ref 7.250–7.300)
pO2, Ven: 44 mmHg (ref 30.0–45.0)

## 2011-03-25 LAB — URINE CULTURE
Colony Count: NO GROWTH
Culture: NO GROWTH

## 2011-03-25 LAB — DIFFERENTIAL
Basophils Absolute: 0 10*3/uL (ref 0.0–0.1)
Basophils Relative: 0 % (ref 0–1)
Eosinophils Absolute: 0.2 10*3/uL (ref 0.0–0.7)
Eosinophils Relative: 4 % (ref 0–5)
Lymphocytes Relative: 24 % (ref 12–46)
Lymphs Abs: 1.3 10*3/uL (ref 0.7–4.0)
Monocytes Absolute: 0.5 10*3/uL (ref 0.1–1.0)
Monocytes Relative: 8 % (ref 3–12)
Neutro Abs: 3.5 10*3/uL (ref 1.7–7.7)
Neutrophils Relative %: 64 % (ref 43–77)

## 2011-03-25 LAB — CARDIAC PANEL(CRET KIN+CKTOT+MB+TROPI)
CK, MB: 1.7 ng/mL (ref 0.3–4.0)
CK, MB: 2.2 ng/mL (ref 0.3–4.0)
Relative Index: 1.3 (ref 0.0–2.5)
Relative Index: 1.4 (ref 0.0–2.5)
Total CK: 131 U/L (ref 7–232)
Total CK: 158 U/L (ref 7–232)
Troponin I: 0.02 ng/mL (ref 0.00–0.06)

## 2011-03-25 LAB — HEPARIN LEVEL (UNFRACTIONATED)
Heparin Unfractionated: 0.44 IU/mL (ref 0.30–0.70)
Heparin Unfractionated: 0.45 IU/mL (ref 0.30–0.70)
Heparin Unfractionated: 0.51 IU/mL (ref 0.30–0.70)
Heparin Unfractionated: 0.55 IU/mL (ref 0.30–0.70)

## 2011-03-25 LAB — URINALYSIS, ROUTINE W REFLEX MICROSCOPIC
Bilirubin Urine: NEGATIVE
Bilirubin Urine: NEGATIVE
Glucose, UA: 250 mg/dL — AB
Glucose, UA: NEGATIVE mg/dL
Hgb urine dipstick: NEGATIVE
Hgb urine dipstick: NEGATIVE
Ketones, ur: NEGATIVE mg/dL
Ketones, ur: NEGATIVE mg/dL
Leukocytes, UA: NEGATIVE
Nitrite: NEGATIVE
Protein, ur: 30 mg/dL — AB
Specific Gravity, Urine: 1.021 (ref 1.005–1.030)
Urobilinogen, UA: 1 mg/dL (ref 0.0–1.0)
pH: 6 (ref 5.0–8.0)
pH: 7 (ref 5.0–8.0)

## 2011-03-25 LAB — PREPARE PLATELETS

## 2011-03-25 LAB — CROSSMATCH
ABO/RH(D): AB POS
Antibody Screen: NEGATIVE

## 2011-03-25 LAB — PREPARE FRESH FROZEN PLASMA

## 2011-03-25 LAB — D-DIMER, QUANTITATIVE: D-Dimer, Quant: 0.85 ug/mL-FEU — ABNORMAL HIGH (ref 0.00–0.48)

## 2011-03-25 LAB — URINE MICROSCOPIC-ADD ON

## 2011-03-25 LAB — HEMOGLOBIN AND HEMATOCRIT, BLOOD
HCT: 22.3 % — ABNORMAL LOW (ref 39.0–52.0)
Hemoglobin: 7.6 g/dL — CL (ref 13.0–17.0)

## 2011-03-25 LAB — LIPID PANEL
HDL: 29 mg/dL — ABNORMAL LOW (ref >39)
LDL Cholesterol: 69 mg/dL (ref 0–99)
Total CHOL/HDL Ratio: 4.7 RATIO
Triglycerides: 186 mg/dL — ABNORMAL HIGH (ref <150)
VLDL: 37 mg/dL (ref 0–40)

## 2011-03-25 LAB — TSH: TSH: 5.668 u[IU]/mL — ABNORMAL HIGH (ref 0.350–4.500)

## 2011-03-25 LAB — PROTIME-INR
INR: 1.5 (ref 0.00–1.49)
Prothrombin Time: 18.9 seconds — ABNORMAL HIGH (ref 11.6–15.2)

## 2011-03-25 LAB — APTT
aPTT: 30 seconds (ref 24–37)
aPTT: 36 seconds (ref 24–37)

## 2011-03-25 LAB — POCT I-STAT GLUCOSE
Glucose, Bld: 167 mg/dL — ABNORMAL HIGH (ref 70–99)
Operator id: 238531

## 2011-04-29 NOTE — Procedures (Signed)
CAROTID DUPLEX EXAM   INDICATION:  Followup carotid artery disease.   HISTORY:  Diabetes:  Cardiac:  Yes.  Hypertension:  Yes.  Smoking:  Quit.  Previous Surgery:  CV History:  Has been staggering and having temporary vision loss,  progressively getting worse, started more than 6 months ago.  Some  memory loss as well per wife.  Amaurosis Fugax Yes, Paresthesias No, Hemiparesis No                                       RIGHT               LEFT  Brachial systolic pressure:         195                 200  Brachial Doppler waveforms:         Biphasic            Biphasic  Vertebral direction of flow:        Antegrade (abnormal)                    Antegrade  DUPLEX VELOCITIES (cm/sec)  CCA peak systolic                   M=59, D=207         95  ECA peak systolic                   287                 81  ICA peak systolic                   344                 [id number]  ICA end diastolic                   120                 29  PLAQUE MORPHOLOGY:                  Mixed               Mixed  PLAQUE AMOUNT:                      Severe              Mild  PLAQUE LOCATION:                    Bifurcation/ICA/ECA  Bifurcation/ICA/ECA   IMPRESSION:  1. Right ICA shows evidence of 80-99% stenosis with the stenosis      appearing to begin in the bifurcation where elevated velocities      were detected as well.  2. This is an increase from previous study.  3. Right ECA stenosis.  4. Left ICA shows evidence of 20-39% stenosis.  5. Right vertebral artery flow was abnormal, left is antegrade.   ___________________________________________  P. Drucie Opitz, M.D.   AS/MEDQ  D:  12/02/2007  T:  12/03/2007  Job:  [id number]

## 2011-04-29 NOTE — Cardiovascular Report (Signed)
NAMETOSHIO, MCKIERNAN NO.:  [id number]   MEDICAL RECORD NO.:  [id number]          PATIENT TYPE:  INP   LOCATION:  2928                         FACILITY:  [facility]   PHYSICIAN:  Lauree Chandler, MDDATE OF BIRTH:  01-10-1935   DATE OF PROCEDURE:  05/02/2009  DATE OF DISCHARGE:                            CARDIAC CATHETERIZATION   PROCEDURES PERFORMED:  1. Left heart catheterization.  2. Selective coronary angiography.  3. Left ventricular angiogram.   OPERATOR:  Lauree Chandler, MD   INDICATIONS:  This is a 76 year old Caucasian male with a history of  peripheral vascular disease, status post right carotid endarterectomy in  January 2009, diabetes mellitus, hypertension, hyperlipidemia, and  chronic kidney disease with a history of known coronary artery disease  with most recent catheterization in 2002 here at [facility]  that demonstrated 40% stenosis of the ostial LAD, 50% stenosis in the  mid LAD, 70% stenosis at the apex in the LAD, and 40-50% stenosis in the  mid circumflex who is admitted with unstable angina.  He is ruled out  for a myocardial infarction with serial cardiac enzymes, but has a story  that is worrisome for angina.  Diagnostic left heart catheterization was  arranged for today.  The patient's creatinine in the outside hospital  yesterday was 2.0.  He was hydrated overnight and recheck this morning  showed that his creatinine was 1.43.  I felt that it will be safe to  proceed with a diagnostic left heart catheterization with adequate  hydration both before and after the procedure.   PROCEDURE IN DETAIL:  The patient was brought to the inpatient cardiac  catheterization laboratory after signing informed consent for the  procedure.  The right groin was prepped and draped in sterile fashion.  Lidocaine 1% was used for local anesthesia.  A 5-French sheath was  inserted into the right femoral artery without difficulty.  I  initially  used a JL-4 diagnostic catheter, which selectively engaged the  circumflex artery.  It was difficult to fill the left anterior  descending with contrast with this catheter.  We then changed out for a  JL-3.5 catheter, which also selectively engaged the circumflex artery.  The left main is a very short vessel and the curvature of these  catheters selectively find the circumflex artery.  I then used a JR-4  catheter to selectively engage and inject the small nondominant right  coronary artery.  At this point in the case, I changed the 5-French  sheath to a 6-French sheath.  I then used an XB LAD 3.5 6-French guiding  catheter to get better engagement into the left main.  With this  catheter, I was able to opacify the left anterior descending coronary  artery.  Several more angiograms were taken with this catheter in place.  I then used a 5-French pigtail catheter to perform a left ventricular  angiogram.  There was no significant pressure gradient noted across the  aortic valve on pullback.  The patient tolerated the procedure well and  was taken to the holding area in stable condition.   HEMODYNAMIC FINDINGS:  Central aortic pressure 139/68.  Left ventricular  pressure 143/6.  Left ventricular end-diastolic pressure 13.   ANGIOGRAPHIC FINDINGS:  1. The left main coronary artery is a very short vessel that      bifurcates into the circumflex in the left anterior descending.      There does not appear to be any significant disease in this vessel.  2. Left anterior descending is a moderate-to-large sized vessel that      courses to the apex.  This vessel gives off an early moderate-sized      diagonal branch and several very small caliber diagonal branches.      There is an ostial 90% stenosis that extends approximately 10 cm      down into the proximal left anterior descending coronary artery.      There is moderate disease in the proximal to midportion of the      vessel that  represents a 60-70% stenosis.  A moderate-sized      diagonal branch arises from this location in the diseased mid LAD.      There appears to be some calcification in the ostium of the      diagonal branch, but no significant flow obstructing lesion.  There      is diffuse plaque noted in the distal left anterior descending      coronary artery and a focal 80% stenosis in the distal LAD as it      wraps around the apex.  3. Circumflex artery is a large dominant vessel.  It gives off a large      first obtuse marginal branch.  The obtuse marginal is a large      bifurcating vessel that has a 70% stenosis in the inferior limb      beyond the bifurcation.  There is a focal 80% lesion in the AV      groove circumflex just beyond the take off of the large marginal      branch.  4. The right coronary artery is a small nondominant vessel that has a      60% stenosis in the proximal portion of the vessel.  5. Left ventricular angiogram was performed with a small hand      injection.  There was poor visualization of the left ventricle.      The overall ejection fraction appeared to be normal.   IMPRESSION:  1. Double-vessel coronary artery disease.  2. Unstable angina.   RECOMMENDATIONS:  This patient has severe double-vessel disease.  He  also is known to have chronic renal insufficiency.  Because of this, I  have elected to not perform any further intervention today.  We will  hydrate him tonight and will follow his renal function closely.  I would  like to perform an echocardiogram to get a good assessment of his left  ventricular function and his regional wall motion.  I would like to  review his cath films with my colleagues.  I think the options at this  time include multivessel PCI versus coronary artery bypass grafting  surgery.  The patient is a diabetic and appears to be a good operative  candidate.  I do not think it would be unreasonable to consider a  surgical approach in this  situation.  Further plans pending review with  my colleagues.      Lauree Chandler, MD  Electronically Signed     CM/MEDQ  D:  05/02/2009  T:  05/03/2009  Job:  [id number] 

## 2011-04-29 NOTE — Discharge Summary (Signed)
[name], [name] NO.:  [id number]   MEDICAL RECORD NO.:  [id number]          PATIENT TYPE:  INP   LOCATION:  2038                         FACILITY:  [facility]   PHYSICIAN:  Ivin Poot, M.D.  DATE OF BIRTH:  11-Dec-1935   DATE OF ADMISSION:  05/01/2009  DATE OF DISCHARGE:                               DISCHARGE SUMMARY   PRIMARY ADMITTING DIAGNOSIS:  Chest pain.   ADDITIONAL/DISCHARGE DIAGNOSES:  1. Coronary artery disease.  2. Unstable angina.  3. Type 2 diabetes mellitus, insulin-dependent.  4. Hypertension.  5. Hyperlipidemia.  6. History of ECC -occlusive diseasestatus post right carotid      endarterectomy in June 2009.  7. Chronic renal insufficiency with baseline creatinine around 2.0.  8. Remote history of tobacco abuse.  9. Mild postoperative ileus.  10.Postoperative atrial fibrillation.   PROCEDURES PERFORMED:  1. Cardiac catheterization.  2. Coronary artery bypass grafting x4 (left internal mammary artery to      the LAD, saphenous vein graft to the diagonal, saphenous vein graft      to the obtuse marginal, and saphenous vein graft to the posterior      descending of the circumflex).  3. Endoscopic vein harvest, right leg.   HISTORY:  The patient is a 76 year old male who developed acute onset of  left-sided chest discomfort on exertion while walking at a normal casual  pace.  The pain radiated to his left neck and was severe lasting for  approximately 1 hour before subsiding.  He ultimately presented to the  emergency department at [facility] for evaluation.  There he was  found to have nonspecific EKG changes and his cardiac enzymes initially  were negative.  Because it was feared that his symptoms were of cardiac  in origin, he was subsequently transferred to [facility] under  the care of [facility] for further cardiac workup.   HOSPITAL COURSE:  After his transfer to [facility], he was seen by  Cardiology and  it was felt that he should undergo diagnostic cardiac  catheterization.  This was performed on May 02, 2009 and he was found to  have a 90% stenosis of proximal LAD, an 80% stenosis of the diagonal, an  80% proximal circumflex, 60% mid circumflex, and a nondominant right  coronary artery with a 40-50% stenosis.  Because he was found on  admission to have an elevated baseline creatinine at around 2.0, he was  given low-contrast load during catheterization and his ejection fraction  was able to be ascertained due to the low-dye load.  It was estimated at  approximately 50%.  Based on his coronary anatomy, he was felt to be a  poor candidate for percutaneous intervention.  He was started on IV  heparin and TCTS was consulted for consideration of surgical  revascularization.  Dr. Tharon Aquas Trigt saw the patient and reviewed his  films and felt that he would benefit from CABG.  He explained all risks,  benefits, and alternatives of surgery to the patient and he agreed to  proceed.  [name] underwent a 2-D echocardiogram prior  to surgery  which showed mild AS, no MR, and good left ventricular function and 65-  70% EF.  He also underwent carotid Doppler studies prior to surgery  which showed no ICA stenosis bilaterally and lower extremity ABIs which  were greater than 1.0 on the right and 0.87 on the left.  He remained  stable and painfree prior to surgery.  He was taken to the operating  room on May 07, 2009 and underwent CABG x4 by Dr. Prescott Gum.  Please see  previously dictated operative report for complete details of surgery.  He tolerated the procedure well and was transferred to the SICU in  stable condition.  He was able to be extubated shortly after surgery.  He was hemodynamically stable and doing well on postop day #1.  His  chest tubes and lines were removed in the usual fashion.  He remained in  the unit for an additional 24 hours observation and by late in the day  of postop day  #2, he was ready for transfer to the Step-Down Unit.  His  postoperative course has been relatively uneventful.  He has had a mild  ileus which has been treated conservatively and is improving.  His  creatinine has remained fairly close to his baseline just over 2.0.  He  has been somewhat volume overloaded and has been started on Lasix to  which he is responding well.  His incisions are all healing well.  He is  ambulating in the halls with cardiac rehab phase 1 and is progressing  well.  He did have 1 brief episode of atrial fibrillation on postop day  3 and was started on amiodarone.  He did convert to normal sinus rhythm  and has been maintaining sinus rhythm since that time.  He has  subsequently been switched to p.o. amiodarone and he seems to be  tolerating this without problem.  His most recent labs showed a sodium  of 132, potassium 3.8, BUN 39, and creatinine 1.92.  White count 9.7,  hemoglobin 9.8, hematocrit 28.9, and platelets 142.  His most recent  chest x-ray was stable with some mild bibasilar atelectasis.  It is felt  that if he continues to progress and no acute changes occur, he will  hopefully be ready for discharge home within the next 48 hours.   DISCHARGE MEDICATIONS:  1. Enteric-coated aspirin 81 mg daily.  2. Amiodarone 400 mg b.i.d. x1 week, then decrease to 200 mg b.i.d.  3. Lasix 40 mg daily x1 week.  4. Potassium 20 mEq daily x1 week.  5. Ultram 50-100 mg q.4 h p.r.n. for pain.  6. Simvastatin 80 mg at bedtime.  7. Metoprolol 25 mg b.i.d.  8. Lantus 80 units subcu at bedtime.  9. Humulin 70/30 twice a day as directed.   DISCHARGE INSTRUCTIONS:  He is asked to refrain from driving, heavy  lifting, or strenuous activity.  He may continue ambulating daily and  using his incentive spirometer.  He may shower daily and clean his  incisions with soap and water.  He will continue a low-fat, low-sodium,  carbohydrate modified, medium-calorie diet.   DISCHARGE  FOLLOWUP:  He will need to make an appointment to see Dr.  Dannielle Burn in 2 weeks.  He will follow up in 3 weeks with Dr. Prescott Gum with  a chest x-ray.  In the interim, if he experiences any problems or has  questions, he is asked to contact our office immediately.  Suzzanne Cloud, P.A.      Ivin Poot, M.D.  Electronically Signed    GC/MEDQ  D:  05/11/2009  T:  05/12/2009  Job:  [id number]   cc:   Isabella Stalling, MD,FACC

## 2011-04-29 NOTE — Assessment & Plan Note (Signed)
OFFICE VISIT   ADRITH, ACKERMAN  DOB:  Jan 07, 1935                                        June 01, 2009  CHART #:  [id number]   CURRENT PROBLEMS:  1. Status post coronary artery bypass graft x4 on May 08, 2009, for      class IV unstable angina.  2. Diabetes mellitus.  3. Normal left ventricular systolic function.  4. Hypertension.  5. Status post right carotid endarterectomy 2009.  6. Chronic renal insufficiency with baseline creatinine 2.0.   CURRENT MEDICATIONS:  1. Aspirin 81 mg daily.  2. Amiodarone 200 mg b.i.d. for postop transient atrial fib.  3. Ultram.  4. Zocor 80 mg nightly.  5. Lopressor 25 mg b.i.d.  6. Lantus insulin 80 units nightly.  7. Humulin 70/30 b.i.d. as directed.  8. Lasix 40 mg daily.  9. Potassium 20 mEq daily.   PRESENT ILLNESS:  The patient is a very nice 76 year old, diabetic  nonsmoker, who presents for his first office visit after multivessel  bypass grafting in late May for unstable angina.  At the time of  surgery, he underwent left IMA grafting to his LAD and endoscopically  harvested saphenous vein grafts placed to the diagonal, circumflex  marginal, and posterior descending branch of the distal left circumflex  (left dominant circulation).  He did well after surgery, although he did  have some issues with postop atrial fibrillation, was converted to sinus  rhythm on amiodarone.  He maintained adequate pulmonary function and was  ambulating well at the time of discharge.  He did have volume overload  and was diuresed up to a BUN of 39 and creatinine of 1.9.  At the time  of discharge, his hemoglobin was 9.8.  He has had no recurrent symptoms  of chest pain since his return home.  He has had some fluid retention  with bilateral ankle swelling and some weight gain treated with oral  Lasix.  His surgical incisions are healing well and his chest x-ray  taken today shows improved aeration of his lungs without evidence  of  pleural effusion.   PHYSICAL EXAMINATION:  VITAL SIGNS:  Blood pressure is 140/70, pulse 55,  and saturation on room air is 97%.  LUNGS:  Breath sounds are clear and equal.  CHEST:  The sternum is healing and is stable.  CARDIAC:  Rhythm is regular without rub.  EXTREMITIES:  The right leg incision has a small eschar, but is healing  and he has bilateral 2+ edema.   PLAN:  We will increase his diuresis and add Zaroxolyn 5 mg a day to his  Lasix 40 mg a day.  He is having insomnia and I give him prescription  for Ambien and also refilled his hydrocodone for pain. I will also ask  him to reduce his amiodarone from b.i.d. to once daily.  He will return  in 2 weeks to assess his edema status.   Ivin Poot, M.D.  Electronically Signed   PV/MEDQ  D:  06/01/2009  T:  06/02/2009  Job:  [id number]   cc:   Ernestine Mcmurray, MD,FACC  Matthias Hughs

## 2011-04-29 NOTE — Assessment & Plan Note (Signed)
OFFICE VISIT   BOLTON, HOUSEHOLDER  DOB:  [birth year]                                        June 15, 2009  CHART #:  [id number]   CURRENT PROBLEMS:  1. Status post coronary artery bypass graft x4, May 08, 2009, for      class IV unstable angina.  2. Edema of the right lower extremity on the saphenous vein harvest      side.  3. Chronic renal insufficiency, mild.   PRESENT ILLNESS:  The patient returns for a second office visit after  surgery to follow up on the status of his lower extremity edema.  It has  improved.  He still is unable to wear his regular shoes.  The surgical  incisions are healing well.  He has no symptoms of angina or CHF.  He  has been taking Lasix combined with a short course of Zaroxolyn.   PHYSICAL EXAMINATION:  Blood pressure is 90/50, pulse is 60, saturation  95%.  Breath sounds are clear and equal.  Cardiac rhythm is regular.  He  has 2+ right ankle edema and trace left ankle edema.   PLAN:  The patient is being followed by the [facility]iology office in  [place].  I feel that another short course of oral Lasix 80 mg a day for  the next 10 days would probably resolve his fluid retention problems.  Otherwise, he will continue his rehab at home as he cannot afford the  hospital-based rehab program he states.  The prescription for 80 mg of  Lasix daily was provided, and the patient will return here p.r.n.   Ivin Poot, M.D.  Electronically Signed   PV/MEDQ  D:  06/15/2009  T:  06/16/2009  Job:  [id number]

## 2011-04-29 NOTE — Consult Note (Signed)
[name], [name] NO.:  [id number]   MEDICAL RECORD NO.:  [id number]          PATIENT TYPE:  INP   LOCATION:  2928                         FACILITY:  [facility]   PHYSICIAN:  Ivin Poot, M.D.  DATE OF BIRTH:  Jul 11, 1935   DATE OF CONSULTATION:  05/05/2009  DATE OF DISCHARGE:                                 CONSULTATION   PRIMARY CARE PHYSICIAN:  Dr. Matthias Hughs in [place]:  Severe multivessel coronary artery disease  with unstable angina.   CHIEF COMPLAINT:  Chest pain.   PRESENT ILLNESS:  I was asked to evaluate this 76 year old Caucasian  diabetic nonsmoker for potential multivessel coronary bypass grafting  after recently diagnosed severe multivessel coronary artery disease.  The patient had a cardiac catheterization 8 years ago which showed mild  to moderate disease which was treated medically.  Two days ago while  visiting  wife at a nursing facility in which she is recovering from  spine surgery, he developed severe pressing chest pain with radiation to  his neck which lasted for approximately an hour.  He had never  experienced such symptoms previously and for that reason he was brought  to the emergency department at [facility] for evaluation.  His  EKG showed a sinus rhythm with nonspecific changes and his point of care  cardiac enzymes were negative.  However, his symptoms were  worrisome  enough to justify transfer to [facility] which was completed  later that night.  He was evaluated for cath however, his creatinine was  2.0.  He was given hydration and prepared for a low contrast load cath  which was performed yesterday.  This demonstrated a 90% stenosis of  proximal LAD, 80% stenosis of the diagonal branch LAD,  80% stenosis of  proximal circumflex and 60% stenosis of the mid circumflex which was a  dominant vessel.  The right coronary was nondominant and had a 40-50%  stenosis and was a small vessel.   His ejection fraction was difficult to  ascertain due to dye load for the ventriculogram however, it appeared to  be approximately 50%.  A 2-D echo has been ordered to further assess LV  function.  Based on the coronary anatomy and symptoms he was felt to be  candidate for surgical coronary revascularization.  He is currently  stable in CCU on IV heparin drip without recurrent chest pain.   PAST MEDICAL HISTORY:  1. Diabetes mellitus on Lantus insulin.  2. Hypertension.  3. Hyperlipidemia.  4. Status post right carotid endarterectomy June 2009 without      complication.  5. Chronic renal insufficiency with baseline creatinine approximately      2.0.   SOCIAL HISTORY:  The patient is married, lives in [place] and has an adult  daughter.  He is retired from working in the [employer].  He has  not smoked in decades and drinks alcohol occasionally.   FAMILY HISTORY:  Strongly positive for coronary artery disease,  myocardial infarction and bypass surgery.  Positive diabetes.   HOME MEDICATIONS:  1. Aspirin 325  mg daily.  2. Zocor 80 mg daily.  3. Lisinopril.  4. HCTZ 20/ 12.5 mg daily.  5. Lopressor 25 b.i.d.  6. Imdur 20 mg b.i.d.  7. Levemir  insulin 40 units subcu b.i.d.  8. 70/30 Humulin insulin 30 units b.i.d.   ALLERGIES:  None.   REVIEW OF SYSTEMS:  CONSTITUTIONAL:  Review is negative for weight loss  or fever.  He has had no symptoms of recent URI.  There is no history of  chest trauma or abnormal chest x-ray.  He denies any difficulty  swallowing or with his dental health.  GI REVIEW: Negative for  hepatitis, jaundice, blood per rectum, GERD.  URINARY AND UROLOGIC:  Positive for nocturia x2 with some BPH but no hematuria, kidney stones  or prostate cancer.  ENDOCRINE:  Positive diabetes.  Negative for  thyroid disease.  The TSH is 5.3.  VASCULAR:  Negative for DVT,  claudication.  Positive for TIA, which was treated with a right carotid  endarterectomy 10 months  ago.  Findings of his left carotid are pending  with a duplex carotid scan.  HEMATOLOGIC:  Review is negative for  bleeding disorder, prior blood transfusion.  NEUROLOGIC:  Review is  negative for stroke or seizure.   PHYSICAL EXAMINATION:  The patient is 5 foot 7 and weighs 180 pounds.  VITAL SIGNS:  Blood pressure is 150/70, pulse 68, respirations 20,  saturation 95% on room air.  GENERAL APPEARANCE:  That of a middle-aged Caucasian male in CCU in no  acute distress on IV heparin.  HEENT:  Exam is normocephalic.  NECK:  Without JVD, mass or bruit.  He has a well-healed right carotid  endarterectomy incision.  LYMPHATICS:  Show no palpable supraclavicular or cervical adenopathy.  Breath sounds are somewhat diminished but clear.  CARDIAC:  Exam is  regular rhythm without S3 gallop or murmur.  ABDOMEN:  Soft, obese, nontender without pulsatile mass.  EXTREMITIES:  Reveal no clubbing, cyanosis or edema.  Peripheral pulses  are trace pedal, 2+ bilateral radial and 2+  femoral.  NEUROLOGIC:  Alert and oriented x3 without focal motor deficit.  He has  a normal gait.   LABORATORY DATA:  I reviewed his coronary arteriograms and his  creatinine today is 1.5 with a hemoglobin of 12 grams. Chest x-ray and 2-  D echo are pending.   IMPRESSION AND PLAN:  The patient has symptomatic multivessel coronary  disease.  He has almost ostial left anterior descending artery stenosis  and high-grade proximal circumflex stenosis and would benefit from  revascularization to the left anterior descending artery diagonal,  circumflex and marginal and possibly the right coronary.  His posterior  descending branch off the circumflex needs a graft.  He will be  scheduled for surgery on May 24 and we will follow his creatinine  following cath.      Ivin Poot, M.D.  Electronically Signed     PV/MEDQ  D:  05/03/2009  T:  05/03/2009  Job:  [id number]   cc:   Isabella Stalling, MD,FACC

## 2011-04-29 NOTE — Discharge Summary (Signed)
[name], [name]              ACCOUNT NO.:  [id number]   MEDICAL RECORD NO.:  [id number]          PATIENT TYPE:  INP   LOCATION:  3305                         FACILITY:  [facility]   PHYSICIAN:  Dorothea Glassman, M.D.    DATE OF BIRTH:  1935-01-18   DATE OF ADMISSION:  12/24/2007  DATE OF DISCHARGE:  12/25/2007                               DISCHARGE SUMMARY   ADMISSION DIAGNOSIS:  Severe progressive right internal carotid artery  stenosis.   DISCHARGE/SECONDARY DIAGNOSES:  1. Severe progressive right internal carotid artery stenosis, status      post right carotid endarterectomy.  2. Mild postoperative hypokalemia, supplemented.  3. Diabetes mellitus type 2.  4. Hyperlipidemia.  5. Hypertension.  6. Coronary artery disease.  7. Mild renal insufficiency with baseline creatinine 1.43.  8. History of tobacco use but has since quit.   ALLERGIES:  NO KNOWN DRUG ALLERGIES.   PROCEDURE:  December 24, 2007:  Right carotid endarterectomy with Dacron  patch angioplasty by Dr. Gordy Clement.   BRIEF HISTORY:  [name] is a 76 year old Caucasian male followed  with carotid Doppler surveillance for known carotid artery occlusive  disease.  He had previously been asymptomatic; however, in early  December 2008 he described some episodes of lightheadedness, staggering  and blurred vision over the past 6 months.  His family had also noticed  some mild memory loss for the past month or so.  Carotid evaluation  revealed progression of his right internal carotid artery stenosis now  to 80% to 99%.  Imaging revealed plaque at the bifurcation extending  into the right internal carotid artery origin.  Normal vessel was  identified beyond the plaque in the right internal carotid artery.  There was a  right external carotid artery stenosis and only 20% to 39%  stenosis of his left internal carotid artery.  Right vertebral flow was  abnormal, left was antegrade.  Based on his new generalized  symptoms and  progression of right internal carotid artery stenosis, Dr. Amedeo Plenty  recommended that he undergo right carotid endarterectomy to reduce his  future risk of stroke.   HOSPITAL COURSE:  [name] was electively admitted to [facility] December 24, 2007, and underwent the previously mentioned  procedure.  He was extubated neurologically intact and after a short  stay in the recovery unit was transferred to step-down unit 3300, where  it is anticipated he will remain until discharge.  At the time of this  dictation, he has remained hemodynamically stable.  Vitals show blood  pressure 138/58.  He is afebrile.  Heart rate is in the mid 50s to mid  60s (baseline EKG at 54 showing sinus bradycardia.  He is saturating 96%  on 2 L per nasal cannula, and we do not anticipate any problems weaning  him from supplemental oxygen.  His arterial line and Foley catheter have  been discontinued.  He reports that he has been voiding.  He has  ambulated without difficulty.  Diet has yet to be advanced but so far he  has been tolerating clear liquids and denies dysphagia,  nausea or  vomiting.   His labs show a white count of 7.7, hemoglobin 11.2, hematocrit 32.8,  platelet count 151, sodium 135, potassium 3.3, which was supplemented,  BUN of 6, creatinine 1.48, and a blood glucose of 163.  His home regimen  of Humulin 70/30 and Lantus insulin has been resumed.   PHYSICAL EXAMINATION:  HEART:  Has a regular rhythm.  LUNGS:  Lung sounds are diminished at the bases but otherwise clear.  ABDOMEN:  Exam was benign.  NEUROLOGIC:  He remains intact, alert and oriented, moving all  extremities, and his tongue is midline.   His incision is healing well without signs of hematoma.   It is anticipated that if he tolerates regular consistency food and  there are no significant changes in his status he will be ready for  discharge home by late morning or early afternoon on December 25, 2007.   Currently he remains in stable condition.   DISCHARGE MEDICATIONS:  1. Isosorbide 30 mg p.o. q.a.m.  2. Simvastatin 80 mg p.o. q.p.m.  3. Benazepril/HCTZ 20/12.5 mg q.a.m.  4. Metoprolol XL 50 mg p.o. daily.  5. Coated aspirin 325 mg p.o. daily.  6. Humulin 70/30 insulin 25 units subcutaneously q.a.m. and q.p.m.  7. Lantus insulin 50 units subcutaneously q.h.s.  8. Tylox 1 or 2 tablets p.o. q.4-6h. p.r.n. pain.   DISCHARGE INSTRUCTIONS:  He is instructed to continue diabetic  appropriate diet, increase activity slowly.  Avoid driving or heavy  lifting for the next 2 weeks.  May shower.  Clean his incision gently  with soap and water.  Call if he develops redness or drainage from the  incision site, severe headache or  changes in his neurological status or  unexplained fever greater than 101.  He will see Dr. Amedeo Plenty in followup  in approximately 2 weeks.  Our office will contact him regarding  specific appointment date and time.      Jacinta Shoe, P.A.      Dorothea Glassman, M.D.  Electronically Signed    AWZ/MEDQ  D:  12/25/2007  T:  12/25/2007  Job:  [id number]   cc:   Dorothea Glassman, M.D.  Isabella Stalling, MD,FACC

## 2011-04-29 NOTE — Assessment & Plan Note (Signed)
[facility] OFFICE NOTE   Cristin, Thaker IRAM SEMINARIO                     MRN:          [id number]  DATE:06/13/2009                            DOB:          1935-10-04    PRIMARY CARE PHYSICIAN:  Dr. Matthias Hughs.   REASON FOR PRESENTATION:  Evaluate the patient status post bypass.   HISTORY OF PRESENT ILLNESS:  The patient presents for followup after  having bypass surgery last month at [facility].  He presented  with chest discomfort consistent with unstable angina.  Subsequently, he  had a cardiac catheterization demonstrating LAD 60-70% stenosis.  This  was mid.  There was distal 80% stenosis.  The circumflex had a 70%  stenosis in an inferior limb of a large obtuse marginal and 80% stenosis  in the AV groove.  The right coronary artery had 60% stenosis.  It was  subsequently decided that the best approach would be CABG.  This was  done by Dr. Prescott Gum (he had a LIMA to the LAD, saphenous vein graft to  diagonal, saphenous vein graft to obtuse marginal, and saphenous vein  graft to the PDA).  He had postoperative atrial fibrillation.  Since  that time, he has had some incisional chest discomfort.  He said he has  not had much relief with the Lortab that was prescribed.  He has had  some trouble sleeping as well.  He has had some mild swelling in his  left leg.  He has a slight area in his right leg.  He has a small area  in his saphenous vein graft harvest site that has some swelling and  tenderness.  However, he has seen Dr. Prescott Gum who thought he was doing  well.  He has not had any drainage.  He has had no fevers.  He has had  no cough.  He denies any of the chest discomfort that he was having  prior to his presentation.  He has not been active yet.  He has had no  palpitations, presyncope, or syncope.   PAST MEDICAL HISTORY:  1. Coronary artery disease as described.  2. Diabetes mellitus type  2.  3. Hypertension.  4. Hyperlipidemia.  5. Right carotid endarterectomy in 2009.  6. Chronic renal insufficiency.  7. Postoperative ileus.  8. Postoperative atrial fibrillation.   ALLERGIES/INTOLERANCES:  None.   MEDICATIONS:  1. Aspirin 81 mg daily.  2. Simvastatin 80 mg daily.  3. Metoprolol 25 mg b.i.d.  4. Isosorbide 20 mg b.i.d.  5. Amiodarone 200 mg b.i.d.  6. Furosemide 40 mg daily.  7. Benazepril HCT 20/12.5 daily.  8. Zolpidem 5 mg daily.  9. Lantus 100/80 units at bedtime.  10.Humulin.   REVIEW OF SYSTEMS:  As stated in the HPI and otherwise negative for all  other systems.   PHYSICAL EXAMINATION:  GENERAL:  The patient is in no distress.  VITAL SIGNS:  Blood pressure 140/74, heart rate 53 and regular, weight  180.6 pounds.  HEENT:  Eyelids unremarkable, pupils equal, round, and reactive to  light, fundi not visualized, oral  mucosa unremarkable.  NECK:  No jugular venous distention at 45 degrees, carotid upstroke  brisk and symmetric, no bruits, no thyromegaly, well-healed carotid  endarterectomy scar.  LYMPHATICS:  No cervical, axillary, inguinal adenopathy.  LUNGS:  Clear to auscultation bilaterally.  BACK:  No costovertebral angle tenderness.  CHEST:  Well-healed sternotomy scar without erythema or drainage.  HEART:  PMI not displaced or sustained, S1 and S2 within normal, no S3,  no S4, no clicks, no rubs, no murmurs.  ABDOMEN:  Obese, positive bowel sounds, normal in frequency and pitch,  no bruits, no rebound, no guarding, no midline pulsatile mass, no  hepatomegaly, no splenomegaly.  SKIN:  No rashes, no nodules.  EXTREMITIES:  Pulses 2+, right leg mild edema.  NEUROLOGIC:  Oriented to person, place, and time, cranial nerves II  through XII grossly intact, motor grossly intact.   ASSESSMENT AND PLAN:  1. The patient is doing relatively well from a cardiovascular      standpoint with his coronary artery bypass graft.  He has some      incisional  chest pain, and I will give him a 1 month prescription      for Vicodin.  He is also having some insomnia, and I will change      him to [facility] as Ambien does not seem to be working.  He will      continue with aggressive risk reduction.  At this point, no further      cardiovascular testing is suggested.  2. Postoperative atrial fibrillation.  He has had no recurrence of      this.  I will discontinue the amiodarone.  3. Hypertension.  Blood pressure is at the upper limits of normal.  We      will watch this, and hopefully with therapeutic lifestyle changes      this will be even less of an issue.  4. Dyslipidemia.  He can have this followed in the future by Dr.      Scotty Court with a goal LDL less than 100 and HDL greater than 40.  5. Obesity.  He understands the need to lose weight with diet and      exercise.   FOLLOWUP:  We will see the patient again in about 2 months or sooner if  needed.    Minus Breeding, MD, [facility]  Electronically Signed   JH/MedQ  DD: 06/13/2009  DT: 06/14/2009  Job #: [id number]   cc:   Matthias Hughs

## 2011-04-29 NOTE — H&P (Signed)
[name], [name] NO.:  [id number]   MEDICAL RECORD NO.:  [id number]          PATIENT TYPE:  INP   LOCATION:  2928                         FACILITY:  [facility]   PHYSICIAN:  Victor Graff, MD   DATE OF BIRTH:  September 12, 1935   DATE OF ADMISSION:  05/01/2009  DATE OF DISCHARGE:                              HISTORY & PHYSICAL   ADMITTING PHYSICIAN:  Victor Champagne, MD, from [facility].   PRIMARY CARE PHYSICIAN:  Dr. Matthias Nguyen in [place], [place].   CHIEF COMPLAINT:  Chest pain.   HISTORY OF PRESENT ILLNESS:  This is a 76 year old white man with a  history of diabetes mellitus and nonobstructive coronary artery disease  who developed left-sided chest something which he describes as heavy  heart beats while walking across the room today at his wife's nursing  home.  He also felt associated left-sided chest tightness and pain in  the back of his neck.  This whole episode lasted approximately 1 hour.  He presented to the outside emergency department where his troponin was  negative.  He was transferred to [facility] for further care, and he is  presently pain free.   PAST MEDICAL HISTORY:  1. History of right carotid endarterectomy in June 2009.  2. Diabetes mellitus type 2.  3. Hypertension.  4. Hyperlipidemia.  5. Catheterization in November 2002 revealed 40% ostial LAD, 50% mid      LAD, 70% at the apex, 40-50% dominant mid circumflex, and a      diseased but nondominant right coronary artery.  6. Chronic kidney disease.  Baseline serum creatinine approximately 2.   SOCIAL HISTORY:  He lives in [place], [place].  His wife is  temporarily in a nursing home, rehabbing from back surgery.  He is a  retired Engineer, manufacturing systems and has a remote tobacco history but not for  several decades.   FAMILY HISTORY:  Mother died at age 27 of multiple medical problems.  Father died at age 71 of chronic coronary artery disease.  The patient  had a brother who  died of an MI at age 41.   ALLERGIES:  None.   MEDICINES:  1. Aspirin 325 p.o. daily.  2. Zocor 80 mg p.o. nightly.  3. Benazepril/hydrochlorothiazide 20/12.5 mg p.o. daily.  4. Lopressor 25 mg p.o. b.i.d.  5. Imdur 20 mg p.o. b.i.d.  6. Levemir insulin 40 units subcu b.i.d.  7. 70/30 insulin 30 units subcu b.i.d.   REVIEW OF SYSTEMS:  Positive for chest pain, chronic dyspnea, weakness,  and dizziness.  Otherwise, 14 systems reviewed negative.   PHYSICAL EXAMINATION:  VITAL SIGNS:  Pulse 59; respiratory rate 18;  blood pressure 157/60; while talking to me, his blood pressure elevated  to 201/69 and is presently back down to 147/55; saturation 99% on 2 L.  GENERAL:  This is a very pleasant white man in no acute distress.  HEENT:  Pupils are round and reactive.  Sclerae clear.  Head is  normocephalic and atraumatic.  Dentition is fair.  NECK:  Supple.  The right carotid scar is well healed.  There are no  carotid bruits.  No JVD.  CARDIAC:  Normal rate, regular rhythm.  No murmurs.  A 2+ dorsalis pedis  and radial pulses bilaterally.  LUNGS:  Clear to auscultation bilaterally without wheezing or rales.  ABDOMEN:  Soft, nontender, nondistended, obese.  EXTREMITIES:  No edema, no rash.  MUSCULOSKELETAL:  No acute joint effusions or deformities.  NEUROLOGIC:  Awake, alert and oriented x3.  Facial expression is strong  and symmetric.  A 5/5 strength in all 4 extremities.  Decreased  sensation in the feet.   DIAGNOSTIC TESTS:  EKG, sinus rhythm, 59 beats per minute with no  ischemic changes.  There are borderline inferior Q-waves.  They do not  appear diagnostic of MI.   LABORATORY DATA:  White blood cells 7, hemoglobin 12.4, platelets 143.  Sodium 139, potassium 4.8, BUN 30, creatinine 2.1.  CK 210, CK-MB 2.6,  troponin 0.01.   IMPRESSION:  This is a 76 year old white man with chest pain today while  minimally exerting himself.   PLAN:  1. Continue to rule out myocardial  infarction by cycling serial EKGs      and cardiac enzymes.  2. Treat with aspirin and unfractionated heparin.  3. Continue IV nitroglycerin for blood pressure and chest pain      control.  4. Continue his other home medicines including      benazepril/hydrochlorothiazide and Lopressor.  We will hold his      Imdur while he is on the nitroglycerin drip.  5. We will check fasting lipid panel and continue Zocor 80 mg daily.  6. Check D-dimer to evaluate VTE event as a cause of his chronic      dyspnea.  7. Check TSH.  8. Consider stress test versus catheterization in the morning being      mindful of his glomerular filtration rate.  9. We should consider evaluating his renal arteries to rule out renal      artery stenosis given his labile hypertension despite multiple      antihypertensive drugs.      Victor Graff, MD  Electronically Signed     NDL/MEDQ  D:  05/01/2009  T:  05/02/2009  Job:  [id number]

## 2011-04-29 NOTE — Op Note (Signed)
[name], [name]              ACCOUNT NO.:  [id number]   MEDICAL RECORD NO.:  [id number]          PATIENT TYPE:  INP   LOCATION:  3305                         FACILITY:  [facility]   PHYSICIAN:  [name], M.D.    DATE OF BIRTH:  [birth year]   DATE OF PROCEDURE:  12/24/2007  DATE OF DISCHARGE:                               OPERATIVE REPORT   SURGEON:  [name], M.D.   ASSISTANT:  1. [name], M.D.   SECOND ASSISTANT:  [name], P.A.-C.   ANESTHESIA:  General endotracheal anesthesia.   ANESTHESIOLOGIST:  [name], M.D.   PREOPERATIVE DIAGNOSIS:  Severe progressive right internal carotid  artery stenosis.   POSTOPERATIVE DIAGNOSIS:  Severe progressive right internal carotid  artery stenosis.   PROCEDURE:  Right carotid endarterectomy with Dacron patch angioplasty.   CLINICAL NOTE:  [name] is a 76 year old male is followed in  the [facility] office with known carotid disease.  He recently presented with  evidence of progression of his right internal carotid artery stenosis,  this is now severe by Doppler evaluation.  We recommended he undergo  right carotid endarterectomy for reduction of stroke risk, the patient  consented for surgery.  The potential risks of the operative procedure  were reviewed with the patient with the major morbidity mortality of 1-  2% to include but not limited to MI, CVA, cranial nerve injury, and  death.   OPERATIVE PROCEDURE:  The patient was brought to the operating room in  stable hemodynamic condition.  He was placed under general endotracheal  anesthesia.  A Foley catheter and arterial line were placed.  The right  neck was prepped and draped in a sterile fashion.  A curvilinear skin  incision was made along the anterior border of the right sternomastoid  muscle.  Dissection was carried down through subcutaneous tissue with  electrocautery.  The platysma was divided.  Deep dissection was carried  along the  anterior border of the sternomastoid to the carotid sheath.  The facial vein was ligated with 2-0 silk and divided.  The carotid  bifurcation was exposed.  The common carotid artery mobilized down to  the omohyoid muscle and was encircled with a vessel loop.  The superior  thyroid and external carotid were freed and encircled with vessel loops.  The internal carotid artery was followed distally up to the posterior  belly of the digastric muscle.  The hypoglossal nerve was reflected  superiorly and preserved.  The distal internal carotid artery was  encircled with a vessel loop.   The patient was administered 7000 units heparin intravenously.  The  carotid bifurcation was examined and revealed calcified plaque in the  bulb extending a short distance into the right internal carotid artery,  beyond this the right internal carotid artery was free of plaque.  The  right internal carotid artery was noted to be small in caliber.  The  carotid vessels were then controlled with clamps.  A longitudinal  arteriotomy was made in the distal common carotid artery.  The  arteriotomy was extended across the  carotid bulb and up into the  internal carotid artery.  The right internal carotid artery origin  revealed a severe stenosis with calcified plaque.   A shunt was inserted.  The plaque was removed with an endarterectomy  elevator.  The endarterectomy was carried down into the common carotid  artery where the plaque was divided transversely with Potts scissors.  The plaque was then raised up in the bulb, the superior thyroid and  external carotid were endarterectomized using an eversion technique, the  distal internal carotid artery plaque feathered out well.  Fragments of  plaque removed with fine forceps.  The site was irrigated with heparin  saline solution.   A patch angioplasty at the endarterectomy site was then carried out  using a running 6-0 Prolene suture with a Finesse Dacron patch.  At   completion of the patch angioplasty, the shunt was removed, all vessels  were well flushed.  The clamps were removed directing initial antegrade  flow up the external carotid artery, following this the internal carotid  was released.  Adequate pulse and Doppler signal were present in the  distal internal carotid artery.  The sponge and instrument counts were  correct.  The patient was administered 50 mg protamine intravenously.   The sternomastoid fascia was closed with running 2-0 Vicryl suture.  The  platysma was closed with running 3-0 Vicryl suture.  The skin was closed  with 4-0 Monocryl and Dermabond applied.  Anesthesia was reversed in the  operating room.  The patient awakened readily.  He moved all extremities  to command.  He was transferred to the recovery room in stable  condition.  No apparent complications.      [name], M.D.  Electronically Signed     PGH/MEDQ  D:  12/24/2007  T:  12/24/2007  Job:  [id number]   cc:   [name]

## 2011-04-29 NOTE — Op Note (Signed)
[name], [name] NO.:  [id number]   MEDICAL RECORD NO.:  [id number]          PATIENT TYPE:  INP   LOCATION:  2302                         FACILITY:  [facility]   PHYSICIAN:  Ivin Poot, M.D.  DATE OF BIRTH:  1935-04-14   DATE OF PROCEDURE:  05/07/2009  DATE OF DISCHARGE:                               OPERATIVE REPORT   OPERATIONS:  1. Coronary artery bypass grafting x4 (left internal mammary artery      left anterior descending, saphenous vein graft to diagonal,      saphenous vein graft to circumflex marginal, saphenous vein graft      to posterior descending branch of distal circumflex).  2. Endoscopic harvest of right leg greater saphenous vein.   SURGEON:  Ivin Poot, MD   ASSISTANT:  John Giovanni, PA-C   ANESTHESIA:  General.   PREOPERATIVE DIAGNOSIS:  Class IV unstable angina with severe 3-vessel  coronary artery disease and preserved left ventricular function.   POSTOPERATIVE DIAGNOSES:  Class IV unstable angina with severe 3-vessel  coronary artery disease and preserved left ventricular function.   INDICATIONS:  The patient is a 76 year old obese diabetic who presents  with symptoms of unstable angina and only minimally elevated cardiac  enzymes.  Cardiac catheterization, however, by Dr. Aundra Dubin demonstrated  severe multivessel coronary disease.  He is felt to be candidate for  surgical revascularization based on his coronary anatomy symptoms and  his diabetes.  Prior to surgery, I examined the patient in his CCU room  and reviewed results of the cardiac cath with the patient and family.  I  discussed the indications and expected benefits of coronary artery  bypass surgery for treatment of his coronary disease.  I reviewed the  alternatives to surgical therapy.  I discussed with the patient, the  major issues of the surgery including the location of the surgical  incisions, the use of general anesthesia and cardiopulmonary bypass, and  we used internal mammary artery and endoscopically harvested saphenous  vein and its conduit, and expected postoperative hospital recovery.  I  reviewed with the patient the risk to him of coronary bypass surgery  including risks of CVA, MI, bleeding, blood transfusion requirement,  infection, and death.  After reviewing these issues, he demonstrated his  understanding and agreed to proceed with surgery under what I felt was  an informed consent.   OPERATIVE FINDINGS:  The patient has severe diabetic disease with  suboptimal targets.  The nondominant right was too small to the graft.  The distal posterior descending of the circumflex was small and  difficult to expose in the AV groove.  The LAD was diffusely calcified.  The vein and mammary artery with good conduit.  No blood products were  used for the operation, except for unit of platelets when his platelet  count returned at 90,000.   PROCEDURE:  The patient was brought to operative room and placed supine  on the operating table, general anesthesia was induced.  The chest,  abdomen, and legs were prepped with Betadine and draped as a sterile  field.  A  sternal incision was made as the saphenous vein was harvested  endoscopically from the right leg.  The left internal mammary artery was  harvested as a pedicle graft from its origin at the subclavian vessels.  There was 1.5-mm vessel, and had adequate flow.  Heparin was  administered after the vein was harvested and inspected, then found to  be adequate.  The pericardium was opened and suspended.  Purse strings  were placed in the ascending aorta and right atrium.  The patient was  then cannulated and placed on bypass.  The coronaries were identified  for grafting and these were difficult to visualize under a layer of  epicardial fat and due to his obese body habitus and a very round  spherical heart.  The cardioplegia catheters were placed at both  antegrade aortic and retrograde  coronary sinus cardioplegia, and the  conduits were prepared within the distal anastomoses.  The patient was  cooled to 30 degrees and the aortic crossclamp was applied.  An 800 mL  of cold blood cardioplegia was delivered in split doses between the  antegrade aortic and retrograde coronary sinus catheters.  There was  good cardioplegic arrest and septal temperature dropped to less than 12  degrees.  Cardioplegia was then delivered every 20 minutes or less while  the crossclamp was applied.   The distal coronary anastomoses was then performed.  The first distal  anastomosis was to the diagonal branch of the LAD.  This was a 1.5-mm  vessel and had a proximal tight 80% stenosis.  A reverse saphenous vein  was sewn end-to-side with running 7-0 Prolene with good flow through  graft.  The second distal anastomosis was to the circumflex marginal  branch of the circumflex.  There was a proximal 80% stenosis and the  reverse saphenous vein was sewn end-to-side with running 7-0 Prolene  with good flow through the graft.  The third distal anastomosis was to  the posterior descending branch of the distal circumflex.  This was a  difficult vessel to expose and we dissected back under the inferior vena  cava back towards the AV groove where the anastomosis was performed.  This 1.2-mm vessel and a reverse saphenous vein was sewn end-to-side  with running 7-0 Prolene with good flow through the graft.  Cardioplegia  was redosed.  The fourth distal anastomosis was to the midportion of the  LAD and a segment of relatively less calcified disease.  The left IMA  pedicle was brought through an opening created in the left lateral  pericardium and was brought down onto the LAD and sewn end-to-side with  a running 8-0 Prolene.  There was good flow through the anastomosis  after briefly releasing the pedicle bulldog and the mammary artery.  The  bulldog was reapplied and a pedicle was secured at epicardium with  6-0  Prolene.  Cardioplegia was redosed.   While the crossclamp was still in place, 3 proximal vein anastomoses  were performed on the ascending aorta using a 4.0-mm punch with running  7-0 Prolene.  Prior to tying down the final proximal anastomosis, air  was vented from the coronaries with a dose of retrograde and warm blood  cardioplegia.  The crossclamp was then removed and the vein grafts were  opened and de-aired with a 27 gauge needle.  The cardioplegic catheters  were removed.  The heart resumed at a spontaneous rhythm.  Proximal and  distal anastomoses were checked and found to be hemostatic.  The  patient  was rewarmed to 37 degrees.  Temporary pacing wires were applied.  The  patient was rewarmed and reperfused.  The lungs re-expanded and the  ventilator was resumed.  The patient was then weaned off bypass without  difficulty.  Cardiac output and blood pressure were stable.  Protamine  was administered without adverse reaction.  The cannula was removed.  The mediastinum was irrigated warm antibiotic irrigation.  There was  still diffuse coagulopathy and a platelet count returned less than  90,000, so the patient was given platelets as well as the an unit of  FFP.  This improved coagulation function.  The superior pericardial fat  was closed over the aorta.  Two mediastinal and a left pleural chest  tube were placed and brought out through separate incisions.  The  sternum was closed with interrupted steel wire.  The pectoralis fascia  was closed with a running Vicryl and the subcutaneous and  skin were closed with a running Vicryl.  Total bypass time was 145  minutes.  This patient would not be a good candidate for redo grafting  due to his severe diffuse diabetic disease and difficulty in  constructing the distal anastomoses.      Ivin Poot, M.D.  Electronically Signed     PV/MEDQ  D:  05/07/2009  T:  05/08/2009  Job:  [id number]   cc:   Lauree Chandler, MD   [facility]

## 2011-04-29 NOTE — Assessment & Plan Note (Signed)
OFFICE VISIT   [name], [name]  DOB:  12/07/1935                                       11/22/2007  [id number]   [name] is a 76 year old gentleman followed with carotid Doppler  surveillance for known carotid artery occlusive disease. He has been  asymptomatic up until this time. He now describes some episodes of  lightheadedness, staggering, blurred vision over the past 6 months. His  family has noted some mild memory loss for the past month or so.   CHRONIC MEDICAL CONDITIONS:  Include coronary artery disease, type 2  diabetes, hypertension and hyperlipidemia. Denies any recent new onset  of symptoms of chest pain or shortness of breath. Blood pressure has  been reasonably controlled. He does take Statin agents for his lipids.   Doppler evaluation carried out today reveals progression of his right  internal carotid artery stenosis now to 80-99%. Imaging reveals plaque  at the bifurcation extending into the right internal carotid artery  origin. Normal vessel is identified beyond the plaque in the right  internal carotid artery.   [name] is a 76 year old gentleman who appears his stated age. No  acute distress. Alert and oriented. BP 146/73 left arm, 165/87 right  arm, pulse is 54 per minute. Right carotid bruit audible. Cranial nerves  intact. Strength equal bilaterally. Reflexes are normal. Heart sounds  are normal without murmurs. Chest is clear with equal air entry  bilaterally.   [name] has a progressive right internal artery stenosis by Doppler  evaluation. New onset of generalized symptoms. No focal symptoms are  evident. His progression of disease, however, does increase his risk of  potential stroke, and I have recommended a right carotid endarterectomy  for reduction of stroke risk. He has consented to this procedure. This  will be electively scheduled for January of this year. He will increase  his aspirin to 325 mg  daily. Potential risks of the operative procedure  have been reviewed and include, but are not limited to MI, CVA, cranial  nerve injury and death.   Dorothea Glassman, M.D.  Electronically Signed   PGH/MEDQ  D:  12/06/2007  T:  12/07/2007  Job:  [id number]   cc:   Isabella Stalling, MD,FACC

## 2011-04-29 NOTE — Assessment & Plan Note (Signed)
OFFICE VISIT   RODERICH, COCKCROFT  DOB:  November 04, 1935                                       06/03/2007  [id number]   OFFICE NOTE   The patient returns today for continued followup of his right carotid  stenosis.  The plan following his initial consultation was to obtain a  CT angiogram of the neck.  He does, however, have some renal  insufficiency, and therefore, could not receive contrast dye.   We repeated his carotid Doppler evaluation in the office today.  He does  have velocities which are very close to critical.  However, do not reach  that threshold.  Strictly speaking, he remains in the 60-79% range of  stenosis in the right internal carotid artery.  He is asymptomatic at  present.  I therefore, would recommend that we continue conservative  treatment.  He will continue to take his aspirin an Statin agents.  I  will plan to follow up with him here in the office in 6 months with a  repeat carotid Doppler evaluation and office visit to see me.   Dorothea Glassman, M.D.  Electronically Signed   PGH/MEDQ  D:  06/03/2007  T:  06/04/2007  Job:  [id number]   cc:   Isabella Stalling, MD,FACC

## 2011-04-29 NOTE — Assessment & Plan Note (Signed)
OFFICE VISIT   [name], [name]  DOB:  15-May-1935                                       01/06/2008  [id number]   The patient underwent right carotid endarterectomy on 12/24/2007 at  [facility] today for scheduled postoperative visit.  BP is 151/76, pulse 56, respirations 18, and O2 saturation 96%.  His  right neck incision is healing unremarkably.  Cranial nerves intact.  Strength equal bilaterally.  No carotid bruits.   Overall, the patient is doing well following his surgery.  He will  return for carotid protocol in 6 months for a carotid Doppler  evaluation.   Dorothea Glassman, M.D.  Electronically Signed   PGH/MEDQ  D:  01/06/2008  T:  01/07/2008  Job:  [id number]   cc:   Isabella Stalling, MD,FACC

## 2011-05-02 NOTE — Discharge Summary (Signed)
[facility]  Patient:    [name], [name] Visit Number: [id number] MRN: [id number]          Service Type: Dictated by:   Joesphine Bare, P.A.-C. [facility] Disc. Date: 10/28/01   CC:         [facility]             Matthias Hughs, M.D.                           Discharge Summary  HISTORY OF PRESENT ILLNESS:  Mr. Lofgreen is a 76 year old male patient who presented to the [facility] on October 24, 2001, with substernal chest pressure, tightness, and heaviness, which radiated into his neck, shoulders, and arms.  This was associated with mild diaphoresis.  The patient has a history of hypertension, diabetes mellitus, hypercholesterolemia, and has a positive family history for premature coronary artery disease. His electrocardiogram at [facility] revealed normal sinus rhythm, with a rate of 79, with no acute ST-T wave changes.  The lab studies revealed a BUN of 16, creatinine 1.1, potassium 3.9. Hemoglobin A1c 8.0, hemoglobin 13.3, hematocrit 40.3, white count 6.5, platelets 498.  Cardiac isoenzymes and troponins were negative.  HOSPITAL COURSE:  The patient was transferred to [facility] on October 26, 2001, and proceeded with a cardiac catheterization with Dr. Loretha Brasil. Stuckey on October 27, 2001.  The catheterization revealed preserved LV function, an ejection fraction of 60%, right coronary artery proximal 75% lesion with extension into OM-I, circumflex a mid-40% lesion. The LAD was a 40% proximal lesion, 50% mid, 80% extreme distal.  The circumflex was dominant.  Dr. Lia Foyer discussed the results with Dr. Satira Sark, and plans were made to continue the patient on aggressive medical therapy.  DISPOSITION:  The patient remained stable overnight and was planned for discharge to home on October 28, 2001.  DISCHARGE MEDICATIONS 1. Toprol XL 50 mg one p.o. q.d. 2. Pravachol 40 mg one p.o. q.d. 3. Enteric-coated  aspirin 325 mg one p.o. q.d. 4. Imdur 30 mg one p.o. q.d. 5. Sublingual nitroglycerin p.r.n. chest pain. 6. He is to resume his Glucovance on October 29, 2001.  ACTIVITIES:  He can gradually resume activities over the next two days.  DIET:  To remain on a low-fat diabetic diet.  INSTRUCTIONS:  Clean the catheterization site with soap and water.  FOLLOWUP:  A follow-up appointment was made for him to see Dr. Domenic Polite in the next two to three weeks. Dictated by:   Joesphine Bare, P.A.-C. [facility] DD:  10/28/01 TD:  10/28/01 Job: [id number]

## 2011-05-02 NOTE — H&P (Signed)
NAME:  [name], [name]                        ACCOUNT NO.:  [id number]   MEDICAL RECORD NO.:  [id number]                   PATIENT TYPE:  INP   LOCATION:  2001                                 FACILITY:  [facility]   PHYSICIAN:  Kirk Ruths, M.D.                DATE OF BIRTH:  1935/03/10   DATE OF ADMISSION:  05/25/2003  DATE OF DISCHARGE:                                HISTORY & PHYSICAL   HISTORY OF PRESENT ILLNESS:  [name] is a 76 year old male patient of  Dr. Myles Gip, who has a past medical history of diabetes mellitus,  hypertension, and coronary disease who we were asked to evaluate for chest  pain.  The patient has had a prior cardiac catheterization in November of  2002.  At that time he was found to have an ejection fraction that  calculated to 58%.  The LAD had a 30-40% osteal lesion.  There was also a 30-  40% lesion beyond the second diagonal as well as a 40-70% mid.  At the apex  there was a 70-80% narrowing.  There was a 40-50% circumflex beyond the  large marginal.  The right coronary artery was nondominant and had a 75%  lesion.  It was felt that medical therapy was indicated.  The patient state  that he was admitted to [facility] in May of 2003 with chest pain and  a nuclear study was normal.  I do not have those records available.  He  typically does have some dyspnea on exertion, but there is no exertional  chest pain.  He did develop chest pain today.  It is located under the left  nipple.  It does not radiate nor is there is associated shortness of breath,  nausea, vomiting, or diaphoresis.  The pain is not positional nor is it  related to food.  It does not change with exertion.  It can increase with  inspiration.  He is presently pain free.   MEDICATIONS AT HOME:  1. Glucovance 2.5/500 b.i.d.  2. Lipitor 10 mg p.o. q.h.s.  3. Imdur 30 mg p.o. daily.  4. Toprol 50 mg p.o. daily.  5. Lotensin 50 mg p.o. daily.  6. Vicodin.  7. Plavix.  8.  Aspirin.  9. Nitroglycerin.   ALLERGIES:  He has no known drug allergies.   SOCIAL HISTORY:  He does not smoke, nor does he consume alcohol.   FAMILY HISTORY:  Positive for coronary artery disease.   PAST MEDICAL HISTORY:  1. Significant for hypertension.  2. Diabetes mellitus.  3. Hyperlipidemia.  4. He has coronary artery disease as outlined above.   PAST SURGICAL HISTORY:  He has had no prior surgeries.  There is no other  significant past medical history.   REVIEW OF SYSTEMS:  He denies any headaches at present, although he has had  headaches in the past.  There is no fever or chills or productive  cough.  There is no dysphagia, odynophagia, melena, or hematochezia.  There is no  dysuria or hematuria.  He denies any orthopnea or PND.  He occasionally has  mild pedal edema.  He does have dyspnea on exertion.  There is no  claudication noted.  He does have history of a peripheral neuropathy.   PHYSICAL EXAMINATION:  VITAL SIGNS:  Blood pressure is 139/81, his pulse is  55.  GENERAL:  He is well-developed, well-nourished, and in no acute distress.  SKIN:  Warm and dry.  HEENT:  Unremarkable with no mileage.  He does not appear to be depressed.  EXTREMITIES:  There is no peripheral clubbing.  NECK:  Supple.  His carotid upstroke is normal and there are no bruits  noted.  There is no jugular venous distention and no thyromegaly.  CHEST:  Clear to auscultation.  Normal expansion.  CARDIOVASCULAR:  Regular rhythm.  Normal S1 and S2.  There are no murmurs,  rubs, or gallops noted.  ABDOMEN:  Nontender, nondistended.  Postive bowel sounds.  No  hepatosplenomegaly, no masses appreciated.  There is no abdominal bruit.  EXTREMITIES:  He has 2+ femoral pulses bilaterally.  No bruits.  His  extremities show no edema and I can palpate no cords.  There are 2+ dorsalis  pedis pulses bilaterally.  NEUROLOGIC:  Grossly intact,although he does have a peripheral neuropathy.   LABORATORY  DATA:  His electrocardiogram shows normal sinus rhythm at a rate  of 58.  The axis is normal.  There are no significant ST changes.   His white blood cell count is 5.9 with a hemoglobin of 12.5 and hematocrit  35.8.  His platelet count is 153.  His d-dimer is less than 0.22.  Initial  enzymes are negative.   DIAGNOSES:  1. Atypical chest pain.  2. Diabetes mellitus.  3. Hypertension.  4. Hyperlipidemia.  5. History of coronary disease.   PLAN:  [name] presents with chest pain that seems atypical for  ischemia.  It may be musculoskeletal in etiology.  His d-dimer is negative  and I therefore think pulmonary embolus is unlikely.  We will check enzymes.  If they are negative then we will plan a Cardiolite.  We will continue with  his aspirin, Toprol, Imdur, and Lipitor.  He will need further outpatient  evaluation of his anemia.                                                  Kirk Ruths, M.D.    BC/MEDQ  D:  05/25/2003  T:  05/25/2003  Job:  [id number]

## 2011-05-02 NOTE — Cardiovascular Report (Signed)
[facility]  Patient:    [name], [name] Visit Number: [id number] MRN: [id number]          Service Type: MED Location: [phone] Attending Physician:  Alvie Heidelberg Dictated by:   Loretha Brasil Lia Foyer, M.D. [facility] Proc. Date: 10/27/01 Admit Date:  10/27/2001   CC:         Matthias Hughs, M.D.  Satira Sark, M.D. [facility]  CV Laboratory   Cardiac Catheterization  INDICATIONS: The patient is a 76 year old with multiple cardiac risk factors. He presents with symptoms suggestive of exertional angina. The current study was done to assess coronary anatomy. The patient is diabetic.  PROCEDURES: 1. Left heart catheterization. 2. Selective coronary arteriography. 3. Selective left ventriculography.  DESCRIPTION OF PROCEDURE: The procedure was performed from the right femoral artery using 6 French catheters.  He tolerated the procedure without complication.  HEMODYNAMICS: 1. Central aortic pressure 147/77. 2. LV pressure 144/4. 3. No gradient on pullback across the aortic valve.  ANGIOGRAPHIC DATA: 1. Ventriculography in the RAO projection reveals vigorous global systolic    function. Ejection fraction was calculated at 58% but visually appears more    in the range of 70%. 2. The left main coronary artery is calcified. There is a very short left main    which is really free of critical disease other than the calcification. 3. The left anterior descending artery demonstrated what appears to be about a    30-40% ostial stenosis. This does not appear to be extremely tight. There    are two diagonal branches of moderate size without critical disease. Beyond    the second diagonal there is an area of narrowing of about 30-40%    which is segmental overlapping the origin of a large septal perforator.    In the mid vessel there is an area of 40-50% narrowing at the apical    tip which wraps the apex there is a 70-80% area of segmental  narrowing. 4. The circumflex provides a large first marginal branch that appears to be    free of critical disease. There is an AV circumflex that is a dominant    vessel and there is about a 40-50% area of narrowing just beyond the    origin of the large marginal. More distally in the AV circumflex is 40%    narrowing followed by an area of about 30% narrowing leading into a    posterolateral and a posterior descending branch. 5. The right coronary artery is a nondominant vessel with bifurcational    disease involving an AV branch as well as a right ventricular branch. This    is about 75% luminal reduction.  CONCLUSIONS: 1. Well preserved overall left ventricular function. 2. Scattered disease of the left anterior descending artery as described    above with segmental ostial narrowing. Mild to moderate mid narrowing    and apical stenosis as noted above. 3. Moderate disease of the dominant circumflex system as described above. 4. Fairly high-grade disease of a small nondominant right coronary.  DISPOSITION: I discussed the case with Dr. Domenic Polite. The patient will be started on a medical treatment regimen. He will see Dr. Domenic Polite back in followup in the office. Aggressive risk factor reduction would also be recommended. Dictated by:   Loretha Brasil Lia Foyer, M.D. [place] Attending Physician:  Alvie Heidelberg DD:  10/27/01 TD:  10/27/01 Job: [id number]

## 2011-05-02 NOTE — Discharge Summary (Signed)
NAME:  [name], [name]                        ACCOUNT NO.:  [id number]   MEDICAL RECORD NO.:  [id number]                   PATIENT TYPE:  INP   LOCATION:  2001                                 FACILITY:  [facility]   PHYSICIAN:  Kirk Ruths, M.D.                DATE OF BIRTH:  1935/05/26   DATE OF ADMISSION:  05/25/2003  DATE OF DISCHARGE:  05/26/2003                           DISCHARGE SUMMARY - REFERRING   DISCHARGE DIAGNOSES:  1. Chest pain, etiology unclear.  2. Nonobstructive coronary artery disease.     a. Catheterization October 27, 2001 with a distal left anterior        descending 70-80%, nondominant right coronary artery at 75%, normal        ejection fraction.     b. Cardiolite in [place], May of 2003--normal per the patient.  3. Hypertension.  4. Diabetes mellitus type 2.  5. Treated hyperlipidemia.   HOSPITAL COURSE:  Please see the dictated Admission History and Physical by  Dr. Kirk Ruths for complete details.  Briefly, this 76 year old male  presented to the emergency room for the evaluation of chest pain.  His  symptoms were felt to be somewhat atypical.  He ruled out for MI by enzymes.  A D-dimer was checked and this was negative.  He was feeling better on the  morning of May 26, 2003 without any further chest pain.  The patient  underwent exercise Cardiolite testing.  He exercised for a total of six  minutes and completed stage 2 of Bruce protocol.  His blood pressure  increased appropriately.  His aim achieved a maximal heart rate of 136 which  was greater than 85% of his predicted maximal heart rate.  EKG showed no  changes.  He did have some shortness of breath and fatigue but no chest  pain.  Images revealed no ischemia, EF 70%.  Therefore, the patient is  discharged to home in stable condition.  Dr. Stanford Breed saw the patient on the  morning of May 26, 2003.   LABORATORY DATA:  White count 5900, hemoglobin 12.5, hematocrit 35.8,  platelet count 153,000.   D-dimer less than 0.22.  INR of 1.  Sodium 139,  potassium 3.9, chloride 105, glucose 165, BUN 18, creatinine 1.2.  Cardiac  enzymes negative x3.  Total cholesterol 128, triglycerides 104, HDL 36, LDL  71, VLDL 21.  Admission chest x-ray was negative.   DISCHARGE MEDICATIONS:  1. Glucovance 2.5/500 mg twice daily.  2. Lipitor 10 mg q.h.s.  3. Imdur 30 mg daily.  4. Toprol-XL 50 mg daily.  5. Lotensin 50 mg daily.  6. Vicodin p.r.n.  7. Plavix 75 mg daily.  8. Aspirin 81 mg daily.  9. Nitroglycerin p.r.n. chest pain.   PAIN MANAGEMENT:  The patient is to call our office in [place] or 9-1-1 for  recurrent chest pain.   ACTIVITY:  As tolerated.   DIET:  A diabetic  diet.   FOLLOW UP:  The patient is to follow up with Dr. Scotty Court in the next 1-2  weeks.  He should call for an appointment.  The patient was noted to be  mildly anemic upon admission and he should follow up with Dr. Scotty Court for  this.  He is to also follow up for further evaluation of his chest pain and  dizziness and shortness of breath.  He will follow up with Dr. Domenic Polite in  [place] in the next 2-4 weeks and the office will contact him with an  appointment.     Richardson Dopp, P.A.                        Kirk Ruths, M.D.    SW/MEDQ  D:  05/26/2003  T:  05/26/2003  Job:  [id number]   cc:   Satira Sark, M.D. [facility]  [place]  [place] [address]  Fax: [phone]

## 2011-09-03 LAB — URINALYSIS, ROUTINE W REFLEX MICROSCOPIC
Ketones, ur: NEGATIVE
Nitrite: NEGATIVE
Protein, ur: NEGATIVE
pH: 5.5

## 2011-09-03 LAB — BASIC METABOLIC PANEL
BUN: 16
Calcium: 8.8
Creatinine, Ser: 1.48
GFR calc non Af Amer: 47 — ABNORMAL LOW
Glucose, Bld: 163 — ABNORMAL HIGH

## 2011-09-03 LAB — CBC
MCHC: 33.7
MCV: 92.9
Platelets: 151
RBC: 3.83 — ABNORMAL LOW
RDW: 12.9
RDW: 13
WBC: 7.7

## 2011-09-03 LAB — COMPREHENSIVE METABOLIC PANEL
ALT: 16
AST: 17
CO2: 28
Calcium: 9.3
Creatinine, Ser: 1.43
GFR calc Af Amer: 59 — ABNORMAL LOW
GFR calc non Af Amer: 49 — ABNORMAL LOW
Sodium: 139
Total Protein: 6.9

## 2011-09-03 LAB — TYPE AND SCREEN
ABO/RH(D): AB POS
Antibody Screen: NEGATIVE

## 2011-09-03 LAB — PROTIME-INR: Prothrombin Time: 13.3

## 2011-09-03 LAB — APTT: aPTT: 27

## 2011-09-03 LAB — ABO/RH: ABO/RH(D): AB POS

## 2016-02-21 ENCOUNTER — Inpatient Hospital Stay (HOSPITAL_COMMUNITY)
Admission: EM | Admit: 2016-02-21 | Discharge: 2016-02-26 | DRG: 871 | Disposition: A | Discharge status: Home / Self Care | Payer: Medicare HMO | Attending: Internal Medicine | Admitting: Internal Medicine

## 2016-02-21 ENCOUNTER — Encounter (HOSPITAL_COMMUNITY): Payer: Self-pay

## 2016-02-21 DIAGNOSIS — I248 Other forms of acute ischemic heart disease: Secondary | ICD-10-CM | POA: Diagnosis present

## 2016-02-21 DIAGNOSIS — I2510 Atherosclerotic heart disease of native coronary artery without angina pectoris: Secondary | ICD-10-CM | POA: Diagnosis present

## 2016-02-21 DIAGNOSIS — Z951 Presence of aortocoronary bypass graft: Secondary | ICD-10-CM

## 2016-02-21 DIAGNOSIS — I130 Hypertensive heart and chronic kidney disease with heart failure and stage 1 through stage 4 chronic kidney disease, or unspecified chronic kidney disease: Secondary | ICD-10-CM | POA: Diagnosis present

## 2016-02-21 DIAGNOSIS — Z7982 Long term (current) use of aspirin: Secondary | ICD-10-CM

## 2016-02-21 DIAGNOSIS — I5033 Acute on chronic diastolic (congestive) heart failure: Secondary | ICD-10-CM | POA: Diagnosis present

## 2016-02-21 DIAGNOSIS — I4891 Unspecified atrial fibrillation: Secondary | ICD-10-CM | POA: Diagnosis present

## 2016-02-21 DIAGNOSIS — Z87891 Personal history of nicotine dependence: Secondary | ICD-10-CM

## 2016-02-21 DIAGNOSIS — N183 Chronic kidney disease, stage 3 unspecified: Secondary | ICD-10-CM

## 2016-02-21 DIAGNOSIS — E1165 Type 2 diabetes mellitus with hyperglycemia: Secondary | ICD-10-CM

## 2016-02-21 DIAGNOSIS — Z794 Long term (current) use of insulin: Secondary | ICD-10-CM

## 2016-02-21 DIAGNOSIS — N189 Chronic kidney disease, unspecified: Secondary | ICD-10-CM

## 2016-02-21 DIAGNOSIS — R0902 Hypoxemia: Secondary | ICD-10-CM

## 2016-02-21 DIAGNOSIS — R7881 Bacteremia: Secondary | ICD-10-CM

## 2016-02-21 DIAGNOSIS — E1122 Type 2 diabetes mellitus with diabetic chronic kidney disease: Secondary | ICD-10-CM | POA: Diagnosis present

## 2016-02-21 DIAGNOSIS — E118 Type 2 diabetes mellitus with unspecified complications: Secondary | ICD-10-CM

## 2016-02-21 DIAGNOSIS — N184 Chronic kidney disease, stage 4 (severe): Secondary | ICD-10-CM

## 2016-02-21 DIAGNOSIS — I480 Paroxysmal atrial fibrillation: Secondary | ICD-10-CM | POA: Diagnosis present

## 2016-02-21 DIAGNOSIS — R7989 Other specified abnormal findings of blood chemistry: Secondary | ICD-10-CM

## 2016-02-21 DIAGNOSIS — R778 Other specified abnormalities of plasma proteins: Secondary | ICD-10-CM | POA: Insufficient documentation

## 2016-02-21 DIAGNOSIS — A4151 Sepsis due to Escherichia coli [E. coli]: Secondary | ICD-10-CM | POA: Diagnosis not present

## 2016-02-21 DIAGNOSIS — E785 Hyperlipidemia, unspecified: Secondary | ICD-10-CM | POA: Diagnosis present

## 2016-02-21 DIAGNOSIS — I959 Hypotension, unspecified: Secondary | ICD-10-CM

## 2016-02-21 DIAGNOSIS — R509 Fever, unspecified: Secondary | ICD-10-CM | POA: Diagnosis not present

## 2016-02-21 DIAGNOSIS — N179 Acute kidney failure, unspecified: Secondary | ICD-10-CM | POA: Diagnosis present

## 2016-02-21 DIAGNOSIS — I25810 Atherosclerosis of coronary artery bypass graft(s) without angina pectoris: Secondary | ICD-10-CM | POA: Diagnosis present

## 2016-02-21 DIAGNOSIS — A419 Sepsis, unspecified organism: Secondary | ICD-10-CM

## 2016-02-21 DIAGNOSIS — N390 Urinary tract infection, site not specified: Secondary | ICD-10-CM

## 2016-02-21 MED ORDER — ACETAMINOPHEN 500 MG PO TABS
1000.0000 mg | ORAL_TABLET | Freq: Once | ORAL | Status: AC
Start: 2016-02-22 — End: 2016-02-21
  Administered 2016-02-21: 1000 mg via ORAL
  Filled 2016-02-21: qty 2

## 2016-02-21 NOTE — ED Provider Notes (Signed)
TIME SEEN: 12:15 AM  CHIEF COMPLAINT: SOB, Fever  HPI:  HPI Comments: [name] is a 81 y.o. male, with a h/o HTN, HLD, DM, CAD and Afib not on anticoagulation, who presents to the Emergency Department complaining of constant influenza like symptoms of a dry cough, sore throat, fever, and generalized myalgias onset ~ 12 hours ago this afternoon. He also notes chest pain, which he denies currently, and describes to have been a brief, fleeting pain lasting for a few seconds at a time. Unable to describe it any further. It is on the left side and does not radiate. He notes no new or worsening SOB. Pt is UTD on influenza vaccination. He has not received PNA vaccination. Denies vomiting or diarrhea, sick contacts, abroad travel, abdominal pain, headache, or neck pain. He is prescribed percocet for 'when my chest or back hurts'. Pt took percocet PTA without significant relief.   PCP: Dr. Scotty Court? In [place]  ROS: See HPI Constitutional: febrile   Eyes: no drainage  ENT: no runny nose   Cardiovascular:  no chest pain  Resp: no SOB  GI: no vomiting GU: no dysuria Integumentary: no rash  Allergy: no hives  Musculoskeletal: no leg swelling  Neurological: no slurred speech ROS otherwise negative  PAST MEDICAL HISTORY/PAST SURGICAL HISTORY:  Past Medical History  Diagnosis Date  . Afib ([place])   . Hypertension   . Hyperlipidemia   . Coronary artery disease   . Diabetes mellitus   . Renal insufficiency   . Renal insufficiency     MEDICATIONS:  Prior to Admission medications   Medication Sig Start Date End Date Taking? Authorizing Provider  AMLODIPINE BESYLATE PO Take 5 mg by mouth daily.    Historical Provider, MD  aspirin 81 MG tablet Take 81 mg by mouth daily.    Historical Provider, MD  eszopiclone (LUNESTA) 2 MG TABS Take 2 mg by mouth at bedtime. Take immediately before bedtime    Historical Provider, MD  HYDROcodone-acetaminophen (VICODIN) 5-500 MG per tablet Take 1 tablet by  mouth every 6 (six) hours as needed.    Historical Provider, MD  insulin glargine (LANTUS) 100 UNIT/ML injection Inject 80 Units into the skin at bedtime.    Historical Provider, MD  insulin NPH-insulin regular (NOVOLIN 70/30) (70-30) 100 UNIT/ML injection Inject 30 Units into the skin 2 (two) times daily with a meal.    Historical Provider, MD  levothyroxine (SYNTHROID, LEVOTHROID) 50 MCG tablet Take 50 mcg by mouth daily.    Historical Provider, MD  lisinopril (PRINIVIL,ZESTRIL) 20 MG tablet Take 20 mg by mouth daily.    Historical Provider, MD  METOPROLOL TARTRATE PO Take 25 mg by mouth 2 (two) times daily.    Historical Provider, MD  nitroGLYCERIN (NITROSTAT) 0.4 MG SL tablet Place 0.4 mg under the tongue every 5 (five) minutes as needed.    Historical Provider, MD  simvastatin (ZOCOR) 80 MG tablet Take 80 mg by mouth at bedtime.    Historical Provider, MD    ALLERGIES:  No Known Allergies  SOCIAL HISTORY:  Social History  Substance Use Topics  . Smoking status: Former Research scientist (life sciences)  . Smokeless tobacco: Not on file  . Alcohol Use: Yes    FAMILY HISTORY: No family history on file.  EXAM: BP 111/66 mmHg  Pulse 94  Temp(Src) 103 F (39.4 C) (Oral)  Resp 16  Ht 5' 6.5" (1.689 m)  Wt 195 lb (88.451 kg)  BMI 31.01 kg/m2  SpO2 94% CONSTITUTIONAL: Alert  and oriented and responds appropriately to questions. Well-appearing; well-nourished; elderly, febrile but non-toxic  HEAD: Normocephalic EYES: Conjunctivae clear, PERRL ENT: normal nose; no rhinorrhea; moist mucous membranes NECK: Supple, no meningismus, no LAD  CARD: Regular and intermittently tachycardic; S1 and S2 appreciated; no murmurs, no clicks, no rubs, no gallops RESP: Normal chest excursion without splinting or tachypnea; breath sounds clear and equal bilaterally; no wheezes, no rhonchi, no rales, no respiratory distress, speaking full sentences, slightly diminished at bases bilaterally, O2 sats on 89-93% on RA at  rest ABD/GI: Normal bowel sounds; non-distended; soft, non-tender, no rebound, no guarding, no peritoneal signs BACK:  The back appears normal and is non-tender to palpation, there is no CVA tenderness EXT: Normal ROM in all joints; non-tender to palpation; no edema; normal capillary refill; no cyanosis, no calf tenderness or swelling    SKIN: Normal color for age and race; warm; no rash NEURO: Moves all extremities equally, sensation to light touch intact diffusely, cranial nerves II through XII intact PSYCH: The patient's mood and manner are appropriate. Grooming and personal hygiene are appropriate.  MEDICAL DECISION MAKING: Patient here with fever, intermittent tachycardia and hypoxia. Will activate code sepsis protocol. He is normotensive. Has possible history of CHF per his daughter. We'll give fluids cautiously. Labs, cultures, lactate, urine, chest x-ray, flu swab pending. EKG shows no ischemic abnormality. Anticipate admission. Will give broad-spectrum antibiotics.  ED PROGRESS: Patient does have a mild leukocytosis here. He also has acute on chronic renal failure. He is receiving IV hydration. Chest x-ray shows no infiltrate. His troponin is mildly elevated which is likely secondary to demand ischemia from sepsis. We'll give aspirin. Lactate is normal. Urine, flu swab pending.   2:30 AM  Pt now has blood pressures in the 80s/40s. Reports feeling better. He is currently receiving 2 L IV fluids. We'll give third liter. Urine shows blood and WBCs and bacteria. Chest x-ray shows no pneumonia and flu swab is negative. He has received broad-spectrum antibiotics. Will admit patient. Discussed patient's case with hospitalist, Dr. Darrick Meigs.  Recommend admission to inpatient, stepdown bed.  I will place holding orders per their request. Patient and family (if present) updated with plan. Care transferred to hospitalist service.   3:00 AM  Pt's blood pressure is improving with IV hydration. Awaiting  stepdown bed.      EKG Interpretation  Date/Time:  Thursday February 21 2016 23:59:13 EST Ventricular Rate:  102 PR Interval:  184 QRS Duration: 116 QT Interval:  334 QTC Calculation: 435 R Axis:   25 Text Interpretation:  Sinus tachycardia Nonspecific intraventricular conduction delay No significant change since last tracing other than rate is faster Confirmed by Matilda Fleig,  DO, Camarion Weier [id number]) on 02/22/2016 12:26:04 AM        CRITICAL CARE Performed by: Nyra Jabs   Total critical care time: 45 minutes  Critical care time was exclusive of separately billable procedures and treating other patients.  Critical care was necessary to treat or prevent imminent or life-threatening deterioration.  Critical care was time spent personally by me on the following activities: development of treatment plan with patient and/or surrogate as well as nursing, discussions with consultants, evaluation of patient's response to treatment, examination of patient, obtaining history from patient or surrogate, ordering and performing treatments and interventions, ordering and review of laboratory studies, ordering and review of radiographic studies, pulse oximetry and re-evaluation of patient's condition.   I personally performed the services described in this documentation, which was scribed in my  presence. The recorded information has been reviewed and is accurate.   [place], DO 02/22/16 [phone]

## 2016-02-21 NOTE — ED Notes (Signed)
Patient complaining of chest pain "for about an hour." Patient vomited scant amount of emesis immediately after taking tylenol. No tablets noticed in emesis.

## 2016-02-21 NOTE — ED Notes (Signed)
I could not get my breath good, sore all over per pt.  Started feeling bad around 11 pm per pt. Felt like I was running a fever, face felt hot. Did not take anything for it per pt.

## 2016-02-22 ENCOUNTER — Encounter (HOSPITAL_COMMUNITY): Payer: Self-pay

## 2016-02-22 ENCOUNTER — Emergency Department (HOSPITAL_COMMUNITY): Payer: Medicare HMO

## 2016-02-22 ENCOUNTER — Inpatient Hospital Stay (HOSPITAL_COMMUNITY): Payer: Medicare HMO

## 2016-02-22 DIAGNOSIS — I130 Hypertensive heart and chronic kidney disease with heart failure and stage 1 through stage 4 chronic kidney disease, or unspecified chronic kidney disease: Secondary | ICD-10-CM | POA: Diagnosis present

## 2016-02-22 DIAGNOSIS — I25708 Atherosclerosis of coronary artery bypass graft(s), unspecified, with other forms of angina pectoris: Secondary | ICD-10-CM | POA: Diagnosis not present

## 2016-02-22 DIAGNOSIS — I9589 Other hypotension: Secondary | ICD-10-CM | POA: Diagnosis not present

## 2016-02-22 DIAGNOSIS — N184 Chronic kidney disease, stage 4 (severe): Secondary | ICD-10-CM

## 2016-02-22 DIAGNOSIS — I480 Paroxysmal atrial fibrillation: Secondary | ICD-10-CM | POA: Diagnosis present

## 2016-02-22 DIAGNOSIS — N390 Urinary tract infection, site not specified: Secondary | ICD-10-CM | POA: Diagnosis not present

## 2016-02-22 DIAGNOSIS — A4151 Sepsis due to Escherichia coli [E. coli]: Secondary | ICD-10-CM | POA: Diagnosis present

## 2016-02-22 DIAGNOSIS — I959 Hypotension, unspecified: Secondary | ICD-10-CM | POA: Insufficient documentation

## 2016-02-22 DIAGNOSIS — R0600 Dyspnea, unspecified: Secondary | ICD-10-CM | POA: Diagnosis not present

## 2016-02-22 DIAGNOSIS — Z7982 Long term (current) use of aspirin: Secondary | ICD-10-CM | POA: Diagnosis not present

## 2016-02-22 DIAGNOSIS — E119 Type 2 diabetes mellitus without complications: Secondary | ICD-10-CM

## 2016-02-22 DIAGNOSIS — N179 Acute kidney failure, unspecified: Secondary | ICD-10-CM

## 2016-02-22 DIAGNOSIS — E1122 Type 2 diabetes mellitus with diabetic chronic kidney disease: Secondary | ICD-10-CM

## 2016-02-22 DIAGNOSIS — Z794 Long term (current) use of insulin: Secondary | ICD-10-CM | POA: Diagnosis not present

## 2016-02-22 DIAGNOSIS — I248 Other forms of acute ischemic heart disease: Secondary | ICD-10-CM | POA: Diagnosis not present

## 2016-02-22 DIAGNOSIS — Z951 Presence of aortocoronary bypass graft: Secondary | ICD-10-CM | POA: Diagnosis not present

## 2016-02-22 DIAGNOSIS — I509 Heart failure, unspecified: Secondary | ICD-10-CM

## 2016-02-22 DIAGNOSIS — N183 Chronic kidney disease, stage 3 (moderate): Secondary | ICD-10-CM

## 2016-02-22 DIAGNOSIS — R7989 Other specified abnormal findings of blood chemistry: Secondary | ICD-10-CM | POA: Diagnosis not present

## 2016-02-22 DIAGNOSIS — I2510 Atherosclerotic heart disease of native coronary artery without angina pectoris: Secondary | ICD-10-CM | POA: Diagnosis not present

## 2016-02-22 DIAGNOSIS — E1165 Type 2 diabetes mellitus with hyperglycemia: Secondary | ICD-10-CM

## 2016-02-22 DIAGNOSIS — R509 Fever, unspecified: Secondary | ICD-10-CM

## 2016-02-22 DIAGNOSIS — R0902 Hypoxemia: Secondary | ICD-10-CM | POA: Diagnosis present

## 2016-02-22 DIAGNOSIS — A419 Sepsis, unspecified organism: Secondary | ICD-10-CM | POA: Diagnosis not present

## 2016-02-22 DIAGNOSIS — Z87891 Personal history of nicotine dependence: Secondary | ICD-10-CM | POA: Diagnosis not present

## 2016-02-22 DIAGNOSIS — I5033 Acute on chronic diastolic (congestive) heart failure: Secondary | ICD-10-CM | POA: Diagnosis present

## 2016-02-22 DIAGNOSIS — E785 Hyperlipidemia, unspecified: Secondary | ICD-10-CM | POA: Diagnosis present

## 2016-02-22 DIAGNOSIS — N189 Chronic kidney disease, unspecified: Secondary | ICD-10-CM

## 2016-02-22 DIAGNOSIS — I5032 Chronic diastolic (congestive) heart failure: Secondary | ICD-10-CM | POA: Diagnosis not present

## 2016-02-22 DIAGNOSIS — I25700 Atherosclerosis of coronary artery bypass graft(s), unspecified, with unstable angina pectoris: Secondary | ICD-10-CM

## 2016-02-22 DIAGNOSIS — R7881 Bacteremia: Secondary | ICD-10-CM | POA: Diagnosis not present

## 2016-02-22 LAB — URINALYSIS, ROUTINE W REFLEX MICROSCOPIC
BILIRUBIN URINE: NEGATIVE
Glucose, UA: NEGATIVE mg/dL
Ketones, ur: NEGATIVE mg/dL
Leukocytes, UA: NEGATIVE
Nitrite: NEGATIVE
Protein, ur: 100 mg/dL — AB
SPECIFIC GRAVITY, URINE: 1.02 (ref 1.005–1.030)
pH: 5.5 (ref 5.0–8.0)

## 2016-02-22 LAB — ECHOCARDIOGRAM COMPLETE
AORTIC ROOT 2D: 29 mm
CHL CUP MV DEC (S): 229 ms
E decel time: 229 msec
EERAT: 4.12
FS: 46 % — AB (ref 28–44)
HEIGHTINCHES: 66 in
IV/PV OW: 0.91
LA ID, A-P, ES: 40 mm
LA SIZE INDEX: 1.91 mm/m2
LA VOL 2D INDEX: 31.8 mL/m2
LA VOL 2D: 66.4 mL
LAVOL: 70.4 mL
LAVOLIN: 33.7 mL/m2
LEFT ATRIUM END SYS DIAM: 40 mm
LV PW d: 16.2 mm — AB (ref 0.6–1.1)
LV PW s: 16.2 mm
LV TDI E'LATERAL: 15.9 cm/s
LVIDD: 43.3 mm — AB (ref 3.5–6.0)
LVIDS: 23.5 mm — AB (ref 2.1–4.0)
MVPKAVEL: 86.3 cm/s
MVPKEVEL: 65.5 cm/s
RV TAPSE: 15.2 mm
TDI e' medial: 7.29 cm/s
Weight: 3541.47 oz

## 2016-02-22 LAB — BLOOD GAS, ARTERIAL
ACID-BASE DEFICIT: 8.2 mmol/L — AB (ref 0.0–2.0)
BICARBONATE: 18 meq/L — AB (ref 20.0–24.0)
Drawn by: 277331
O2 Content: 2 L/min
O2 Saturation: 79.2 %
PCO2 ART: 30.8 mmHg — AB (ref 35.0–45.0)
PH ART: 7.345 — AB (ref 7.350–7.450)
Patient temperature: 37
pO2, Arterial: 79.2 mmHg — ABNORMAL LOW (ref 80.0–100.0)

## 2016-02-22 LAB — TROPONIN I
TROPONIN I: 0.94 ng/mL — AB (ref <0.031)
TROPONIN I: 1.2 ng/mL — AB (ref <0.031)
TROPONIN I: 1.34 ng/mL — AB (ref <0.031)
Troponin I: 0.1 ng/mL — ABNORMAL HIGH (ref <0.031)
Troponin I: 0.98 ng/mL (ref <0.031)

## 2016-02-22 LAB — D-DIMER, QUANTITATIVE: D-Dimer, Quant: 1.56 ug/mL-FEU — ABNORMAL HIGH (ref 0.00–0.50)

## 2016-02-22 LAB — COMPREHENSIVE METABOLIC PANEL
ALT: 11 U/L — AB (ref 17–63)
AST: 16 U/L (ref 15–41)
Albumin: 3.3 g/dL — ABNORMAL LOW (ref 3.5–5.0)
Alkaline Phosphatase: 50 U/L (ref 38–126)
Anion gap: 7 (ref 5–15)
BUN: 52 mg/dL — AB (ref 6–20)
CHLORIDE: 113 mmol/L — AB (ref 101–111)
CO2: 20 mmol/L — AB (ref 22–32)
CREATININE: 2.61 mg/dL — AB (ref 0.61–1.24)
Calcium: 8.2 mg/dL — ABNORMAL LOW (ref 8.9–10.3)
GFR calc Af Amer: 25 mL/min — ABNORMAL LOW (ref >60)
GFR calc non Af Amer: 22 mL/min — ABNORMAL LOW (ref >60)
Glucose, Bld: 58 mg/dL — ABNORMAL LOW (ref 65–99)
Potassium: 4.2 mmol/L (ref 3.5–5.1)
SODIUM: 140 mmol/L (ref 135–145)
Total Bilirubin: 0.7 mg/dL (ref 0.3–1.2)
Total Protein: 6.3 g/dL — ABNORMAL LOW (ref 6.5–8.1)

## 2016-02-22 LAB — BASIC METABOLIC PANEL
Anion gap: 9 (ref 5–15)
BUN: 58 mg/dL — AB (ref 6–20)
CO2: 20 mmol/L — ABNORMAL LOW (ref 22–32)
CREATININE: 2.87 mg/dL — AB (ref 0.61–1.24)
Calcium: 8.8 mg/dL — ABNORMAL LOW (ref 8.9–10.3)
Chloride: 108 mmol/L (ref 101–111)
GFR calc Af Amer: 23 mL/min — ABNORMAL LOW (ref >60)
GFR, EST NON AFRICAN AMERICAN: 20 mL/min — AB (ref >60)
Glucose, Bld: 127 mg/dL — ABNORMAL HIGH (ref 65–99)
Potassium: 4.4 mmol/L (ref 3.5–5.1)
SODIUM: 137 mmol/L (ref 135–145)

## 2016-02-22 LAB — URINE MICROSCOPIC-ADD ON

## 2016-02-22 LAB — GLUCOSE, CAPILLARY
GLUCOSE-CAPILLARY: 82 mg/dL (ref 65–99)
GLUCOSE-CAPILLARY: 102 mg/dL — AB (ref 65–99)
GLUCOSE-CAPILLARY: 186 mg/dL — AB (ref 65–99)
Glucose-Capillary: 34 mg/dL — CL (ref 65–99)
Glucose-Capillary: 107 mg/dL — ABNORMAL HIGH (ref 65–99)

## 2016-02-22 LAB — CBC
HCT: 33.7 % — ABNORMAL LOW (ref 39.0–52.0)
HEMATOCRIT: 30.6 % — AB (ref 39.0–52.0)
HEMOGLOBIN: 10.1 g/dL — AB (ref 13.0–17.0)
Hemoglobin: 11.1 g/dL — ABNORMAL LOW (ref 13.0–17.0)
MCH: 31.5 pg (ref 26.0–34.0)
MCH: 31.9 pg (ref 26.0–34.0)
MCHC: 32.9 g/dL (ref 30.0–36.0)
MCHC: 33 g/dL (ref 30.0–36.0)
MCV: 95.7 fL (ref 78.0–100.0)
MCV: 96.5 fL (ref 78.0–100.0)
PLATELETS: 124 10*3/uL — AB (ref 150–400)
PLATELETS: 130 10*3/uL — AB (ref 150–400)
RBC: 3.17 MIL/uL — AB (ref 4.22–5.81)
RBC: 3.52 MIL/uL — ABNORMAL LOW (ref 4.22–5.81)
RDW: 12.4 % (ref 11.5–15.5)
RDW: 12.5 % (ref 11.5–15.5)
WBC: 11.2 10*3/uL — AB (ref 4.0–10.5)
WBC: 12.7 10*3/uL — AB (ref 4.0–10.5)

## 2016-02-22 LAB — HEPATIC FUNCTION PANEL
ALK PHOS: 63 U/L (ref 38–126)
ALT: 12 U/L — AB (ref 17–63)
AST: 17 U/L (ref 15–41)
Albumin: 3.9 g/dL (ref 3.5–5.0)
BILIRUBIN INDIRECT: 0.7 mg/dL (ref 0.3–0.9)
Bilirubin, Direct: 0.1 mg/dL (ref 0.1–0.5)
TOTAL PROTEIN: 7.7 g/dL (ref 6.5–8.1)
Total Bilirubin: 0.8 mg/dL (ref 0.3–1.2)

## 2016-02-22 LAB — INFLUENZA PANEL BY PCR (TYPE A & B)
H1N1 flu by pcr: NOT DETECTED
Influenza A By PCR: NEGATIVE
Influenza B By PCR: NEGATIVE

## 2016-02-22 LAB — BRAIN NATRIURETIC PEPTIDE
B NATRIURETIC PEPTIDE 5: 78 pg/mL (ref 0.0–100.0)
B Natriuretic Peptide: 684 pg/mL — ABNORMAL HIGH (ref 0.0–100.0)

## 2016-02-22 LAB — MRSA PCR SCREENING: MRSA by PCR: NEGATIVE

## 2016-02-22 LAB — LACTIC ACID, PLASMA
LACTIC ACID, VENOUS: 1.1 mmol/L (ref 0.5–2.0)
LACTIC ACID, VENOUS: 1.6 mmol/L (ref 0.5–2.0)

## 2016-02-22 MED ORDER — FUROSEMIDE 10 MG/ML IJ SOLN
20.0000 mg | Freq: Every day | INTRAMUSCULAR | Status: DC
  Administered 2016-02-23 – 2016-02-25 (×3): 20 mg via INTRAVENOUS
  Filled 2016-02-22 (×3): qty 2

## 2016-02-22 MED ORDER — SODIUM CHLORIDE 0.9 % IV BOLUS (SEPSIS)
1000.0000 mL | Freq: Once | INTRAVENOUS | Status: AC
  Administered 2016-02-22: 1000 mL via INTRAVENOUS

## 2016-02-22 MED ORDER — ENOXAPARIN SODIUM 30 MG/0.3ML ~~LOC~~ SOLN
30.0000 mg | SUBCUTANEOUS | Status: DC
  Administered 2016-02-22 – 2016-02-26 (×5): 30 mg via SUBCUTANEOUS
  Filled 2016-02-22 (×5): qty 0.3

## 2016-02-22 MED ORDER — FUROSEMIDE 10 MG/ML IJ SOLN
40.0000 mg | Freq: Two times a day (BID) | INTRAMUSCULAR | Status: DC
  Administered 2016-02-22: 40 mg via INTRAVENOUS
  Filled 2016-02-22 (×2): qty 4

## 2016-02-22 MED ORDER — ATORVASTATIN CALCIUM 40 MG PO TABS
40.0000 mg | ORAL_TABLET | Freq: Every day | ORAL | Status: DC
  Administered 2016-02-22 – 2016-02-25 (×4): 40 mg via ORAL
  Filled 2016-02-22 (×4): qty 1

## 2016-02-22 MED ORDER — VANCOMYCIN HCL IN DEXTROSE 1-5 GM/200ML-% IV SOLN
1000.0000 mg | INTRAVENOUS | Status: DC
  Administered 2016-02-23: 1000 mg via INTRAVENOUS
  Filled 2016-02-22: qty 200

## 2016-02-22 MED ORDER — ASPIRIN 81 MG PO CHEW
324.0000 mg | CHEWABLE_TABLET | Freq: Once | ORAL | Status: AC
  Administered 2016-02-22: 324 mg via ORAL
  Filled 2016-02-22: qty 4

## 2016-02-22 MED ORDER — SODIUM CHLORIDE 0.9 % IV SOLN: 1000.0000 mL | INTRAVENOUS | Status: DC

## 2016-02-22 MED ORDER — LEVOTHYROXINE SODIUM 50 MCG PO TABS
50.0000 ug | ORAL_TABLET | Freq: Every day | ORAL | Status: DC
  Administered 2016-02-22 – 2016-02-26 (×5): 50 ug via ORAL
  Filled 2016-02-22 (×3): qty 2
  Filled 2016-02-22: qty 1
  Filled 2016-02-22: qty 2

## 2016-02-22 MED ORDER — PIPERACILLIN-TAZOBACTAM 3.375 G IVPB 30 MIN
3.3750 g | Freq: Once | INTRAVENOUS | Status: AC
  Administered 2016-02-22: 3.375 g via INTRAVENOUS
  Filled 2016-02-22: qty 50

## 2016-02-22 MED ORDER — ASPIRIN EC 81 MG PO TBEC
81.0000 mg | DELAYED_RELEASE_TABLET | Freq: Every day | ORAL | Status: DC
  Administered 2016-02-22 – 2016-02-26 (×5): 81 mg via ORAL
  Filled 2016-02-22 (×5): qty 1

## 2016-02-22 MED ORDER — ACETAMINOPHEN 325 MG PO TABS
650.0000 mg | ORAL_TABLET | Freq: Four times a day (QID) | ORAL | Status: DC | PRN
  Administered 2016-02-22 – 2016-02-26 (×6): 650 mg via ORAL
  Filled 2016-02-22 (×6): qty 2

## 2016-02-22 MED ORDER — PIPERACILLIN-TAZOBACTAM 3.375 G IVPB
3.3750 g | Freq: Three times a day (TID) | INTRAVENOUS | Status: DC
  Administered 2016-02-22: 3.375 g via INTRAVENOUS
  Filled 2016-02-22: qty 50

## 2016-02-22 MED ORDER — INSULIN GLARGINE 100 UNIT/ML ~~LOC~~ SOLN
80.0000 [IU] | Freq: Every day | SUBCUTANEOUS | Status: DC
  Administered 2016-02-22 – 2016-02-24 (×3): 80 [IU] via SUBCUTANEOUS
  Filled 2016-02-22 (×3): qty 0.8

## 2016-02-22 MED ORDER — MORPHINE SULFATE (PF) 4 MG/ML IV SOLN: 4.0000 mg | INTRAVENOUS | Status: DC | PRN

## 2016-02-22 MED ORDER — DEXTROSE 5 % IV SOLN
1.0000 g | INTRAVENOUS | Status: DC
  Administered 2016-02-22 – 2016-02-25 (×4): 1 g via INTRAVENOUS
  Filled 2016-02-22 (×5): qty 10

## 2016-02-22 MED ORDER — DEXTROSE 50 % IV SOLN
INTRAVENOUS | Status: AC
  Administered 2016-02-22: 25 mL
  Filled 2016-02-22: qty 50

## 2016-02-22 MED ORDER — VANCOMYCIN HCL IN DEXTROSE 1-5 GM/200ML-% IV SOLN: 1000.0000 mg | INTRAVENOUS | Status: DC

## 2016-02-22 MED ORDER — VANCOMYCIN HCL IN DEXTROSE 1-5 GM/200ML-% IV SOLN
1000.0000 mg | Freq: Once | INTRAVENOUS | Status: AC
  Administered 2016-02-22: 1000 mg via INTRAVENOUS
  Filled 2016-02-22: qty 200

## 2016-02-22 MED ORDER — LEVALBUTEROL HCL 0.63 MG/3ML IN NEBU
0.6300 mg | INHALATION_SOLUTION | Freq: Once | RESPIRATORY_TRACT | Status: AC
  Administered 2016-02-22: 0.63 mg via RESPIRATORY_TRACT

## 2016-02-22 MED ORDER — POTASSIUM CHLORIDE CRYS ER 20 MEQ PO TBCR
20.0000 meq | EXTENDED_RELEASE_TABLET | Freq: Every day | ORAL | Status: DC
  Administered 2016-02-22 – 2016-02-26 (×5): 20 meq via ORAL
  Filled 2016-02-22 (×5): qty 1

## 2016-02-22 MED ORDER — ACETAMINOPHEN 650 MG RE SUPP
650.0000 mg | Freq: Four times a day (QID) | RECTAL | Status: DC | PRN
Start: 2016-02-22 — End: 2016-02-26

## 2016-02-22 MED ORDER — LEVALBUTEROL HCL 0.63 MG/3ML IN NEBU
INHALATION_SOLUTION | RESPIRATORY_TRACT | Status: AC
  Filled 2016-02-22: qty 3

## 2016-02-22 MED ORDER — ALBUTEROL SULFATE (2.5 MG/3ML) 0.083% IN NEBU
2.5000 mg | INHALATION_SOLUTION | Freq: Three times a day (TID) | RESPIRATORY_TRACT | Status: DC
  Administered 2016-02-22 – 2016-02-24 (×7): 2.5 mg via RESPIRATORY_TRACT
  Filled 2016-02-22 (×7): qty 3

## 2016-02-22 MED ORDER — PERFLUTREN LIPID MICROSPHERE
1.0000 mL | INTRAVENOUS | Status: AC | PRN
  Filled 2016-02-22: qty 10

## 2016-02-22 MED ORDER — SODIUM CHLORIDE 0.9 % IV SOLN
1000.0000 mL | INTRAVENOUS | Status: DC
  Administered 2016-02-22: 1000 mL via INTRAVENOUS

## 2016-02-22 MED ORDER — INSULIN ASPART 100 UNIT/ML ~~LOC~~ SOLN
0.0000 [IU] | Freq: Three times a day (TID) | SUBCUTANEOUS | Status: DC
  Administered 2016-02-23 (×2): 3 [IU] via SUBCUTANEOUS
  Administered 2016-02-23: 2 [IU] via SUBCUTANEOUS
  Administered 2016-02-24: 7 [IU] via SUBCUTANEOUS
  Administered 2016-02-24: 3 [IU] via SUBCUTANEOUS
  Administered 2016-02-24: 2 [IU] via SUBCUTANEOUS
  Administered 2016-02-25: 3 [IU] via SUBCUTANEOUS

## 2016-02-22 NOTE — Progress Notes (Signed)
Pharmacy Antibiotic Note  [name] is a 81 y.o. male admitted on 02/21/2016 with sepsis.  Pharmacy has been consulted for Vancomycin and Zosyn dosing.  Plan: Vancomycin 1000 IV every 24 hours.  Goal trough 15-20 mcg/mL. Zosyn 3.375g IV q8h (4 hour infusion). Monitor labs, micro and vitals.   Height: 5\' 6"  (167.6 cm) Weight: 221 lb 5.5 oz (100.4 kg) IBW/kg (Calculated) : 63.8  Temp (24hrs), Avg:99.5 F (37.5 C), Min:97 F (36.1 C), Max:103 F (39.4 C)   Recent Labs Lab 02/22/16 0006 02/22/16 0356  WBC 12.7* 11.2*  CREATININE 2.87* 2.61*  LATICACIDVEN 1.6 1.1    Estimated Creatinine Clearance: 25.9 mL/min (by C-G formula based on Cr of 2.61).    No Known Allergies  Antimicrobials this admission: Vanc 3/10 >>  Zosyn 3/10 >>   Dose adjustments this admission: n/a  Microbiology results: 3/10 BCx: pending 3/10 UCx: pending  3/10 MRSA PCR: (-)  Thank you for allowing pharmacy to be a part of this patient's care.  Pricilla Larsson 02/22/2016 9:48 AM

## 2016-02-22 NOTE — Progress Notes (Signed)
*  PRELIMINARY RESULTS* Echocardiogram 2D Echocardiogram has been performed.  Leavy Cella 02/22/2016, 11:19 AM

## 2016-02-22 NOTE — Care Management Note (Signed)
Case Management Note  Patient Details  Name: [name] MRN: [id number] Date of Birth: Aug 30, 1935  Subjective/Objective:                  Pt admitted with sepsis. Pt's son at bedside for assessment. Pt is from home, lives alone and has strong family support. Pt is ind with ADL's. Pt has no HH services or DME prior to admission. Pt drives himself to appointments. Family plans for pt to return home with self care, may need HH. Pt has never had [place] services.   Action/Plan: Pt will need PT eval prior to DC. Will cont to follow.   Expected Discharge Date:    02/27/2016              Expected Discharge Plan:  [place]  In-House Referral:  NA  Discharge planning Services  CM Consult  Post Acute Care Choice:    Choice offered to:     DME Arranged:    DME Agency:     HH Arranged:    HH Agency:     Status of Service:  In process, will continue to follow  Medicare Important Message Given:  Yes Date Medicare IM Given:    Medicare IM give by:    Date Additional Medicare IM Given:    Additional Medicare Important Message give by:     If discussed at [place] of Stay Meetings, dates discussed:    Additional Comments:  Sherald Barge, RN 02/22/2016, 1:44 PM

## 2016-02-22 NOTE — Consult Note (Signed)
CARDIOLOGY CONSULT NOTE   Nguyen ID: [name] MRN: [id number] DOB/AGE: 03/15/1935 81 y.o.  Admit Date: 02/21/2016 Referring Physician: TRH-Lama  Primary Physician: Deloria Lair, MD Consulting Cardiologist: Kate Sable MD Primary Cardiologist: Minus Breeding MD Reason for Consultation: Positive troponin  Clinical Summary [name] is a 81 y.o.male with history of non-obstructive CAD with most recent cardiac cath in 2002 demonstrating distal LAD stenosis (70-80%), nondominant RCA at 75% and normal EF, hypertension, Diabetes, hyperlipidemia. Who was admitted from ER with complaints of dyspnea, sepsis, and hypotension. He was also found to be in acute renal failure. He was found to have positive Troponin 0.10, 0.94. We are asked for cardiology recommendations.   He states that he has been feeling more tired and dyspneic, but has chronic DOE. He has been noticing worsening edema and abdominal distention. He states he cannot hardly walk 100 feet without being dyspneic. He has had some chest pressure in Victor upper right chest which began yesterday with worsening weakness and low grade nausea. This prompted ER evaluation. .   In ER, blood pressure was 111/66, heart 94, O2 sat 94%, temperature 103. Urinalysis was negative for nitrates. Sodium 137, potassium 4.4, chloride 108, CO2 20, glucose 127, creatinine 2.87 with a BUN of 58. White blood cells were elevated at 12.7 with hemoglobin of 11.1, hematocrit 33.7, platelets 1:30. As stated, troponin 0.10. Chest x-ray reveals stable cardiomegaly and vascular congestion with mild bibasilar atelectasis but no evidence of pneumonia. EKG revealed sinus rhythm with intraventricular conduction delay, sinus tachycardic rate of 102 bpm.  Electronically Signed No Known Allergies  Medications Scheduled Medications: . aspirin EC  81 mg Oral Daily  . atorvastatin  40 mg Oral q1800  . enoxaparin (LOVENOX) injection  30 mg Subcutaneous Q24H    . insulin aspart  0-9 Units Subcutaneous TID WC  . insulin glargine  80 Units Subcutaneous QHS  . levothyroxine  50 mcg Oral QAC breakfast    Infusions: . sodium chloride 1,000 mL (02/22/16 0359)    PRN Medications: acetaminophen **OR** acetaminophen   Past Medical History  Diagnosis Date  . Afib ([place])   . Hypertension   . Hyperlipidemia   . Coronary artery disease   . Diabetes mellitus   . Renal insufficiency   . Renal insufficiency     Past Surgical History  Procedure Laterality Date  . Coronary artery bypass graft    . Carotid insuff      History reviewed. No pertinent family history.  Social History [name] reports that he has quit smoking. He does not have any smokeless tobacco history on file. [name] reports that he drinks alcohol.  Review of Systems Complete review of systems are found to be negative unless outlined in H&P above.  Physical Examination Blood pressure 101/41, pulse 67, temperature 97 F (36.1 C), temperature source Oral, resp. rate 22, height 5\' 6"  (1.676 m), weight 221 lb 5.5 oz (100.4 kg), SpO2 98 %.  Intake/Output Summary (Last 24 hours) at 02/22/16 0950 Last data filed at 02/22/16 0755  Gross per 24 hour  Intake      0 ml  Output    350 ml  Net   -350 ml    Telemetry:  GEN: Dyspneic at rest, HEENT: Conjunctiva and lids normal, oropharynx clear with moist mucosa. Neck: Supple, no elevated JVP or carotid bruits, no thyromegaly. Lungs: Bibasilar crackles, no wheezes, rhonchi or coughing . Wearing oxygen, dyspneic with any movement. Cardiac: Regular rate and rhythm,  distant heart sounds no S3 or significant systolic murmur, no pericardial rub. Abdomen: Distended, no hepatomegaly, bowel sounds present, no guarding or rebound. Extremities: 2+ pretibial pitting edema, distal pulses 2+. Skin: Warm and dry. Musculoskeletal: No kyphosis. Neuropsychiatric: Alert and oriented x3, affect grossly appropriate.  Prior Cardiac  Testing/Procedures Cardiac Cath :2010 1. Victor left main coronary artery is a very short vessel that  bifurcates into Victor circumflex in Victor left anterior descending.  There does not appear to be any significant disease in this vessel. 2. Left anterior descending is a moderate-to-large sized vessel that  courses to Victor apex. This vessel gives off an early moderate-sized  diagonal branch and several very small caliber diagonal branches.  There is an ostial 90% stenosis that extends approximately 10 cm  down into Victor proximal left anterior descending coronary artery.  There is moderate disease in Victor proximal to midportion of Victor  vessel that represents a 60-70% stenosis. A moderate-sized  diagonal branch arises from this location in Victor diseased mid LAD.  There appears to be some calcification in Victor ostium of Victor  diagonal branch, but no significant flow obstructing lesion. There  is diffuse plaque noted in Victor distal left anterior descending  coronary artery and a focal 80% stenosis in Victor distal LAD as it  wraps around Victor apex. 3. Circumflex artery is a large dominant vessel. It gives off a large  first obtuse marginal branch. Victor obtuse marginal is a large  bifurcating vessel that has a 70% stenosis in Victor inferior limb  beyond Victor bifurcation. There is a focal 80% lesion in Victor AV  groove circumflex just beyond Victor take off of Victor large marginal  branch. 4. Victor right coronary artery is a small nondominant vessel that has a  60% stenosis in Victor proximal portion of Victor vessel. 5. Left ventricular angiogram was performed with a small hand  injection. There was poor visualization of Victor left ventricle.  Victor overall ejection fraction appeared to be normal.  IMPRESSION: 1. Double-vessel coronary artery disease. 2. Unstable angina.  RECOMMENDATIONS: This Nguyen has severe  double-vessel disease. He also is known to have chronic renal insufficiency. Because of this, I have elected to not perform any further intervention today. We will hydrate him tonight and will follow his renal function closely. I would like to perform an echocardiogram to get a good assessment of his left ventricular function and his regional wall motion. I would like to review his cath films with my colleagues. I think Victor options at this time include multivessel PCI versus coronary artery bypass grafting surgery. [name] is a diabetic and appears to be a good operative candidate. I do not think it would be unreasonable to consider a surgical approach in this situation. Further plans pending review with my colleagues. Lauree Chandler, MD  CABG 2010  1. Coronary artery bypass grafting x4 (left internal mammary artery  left anterior descending, saphenous vein graft to diagonal,  saphenous vein graft to circumflex marginal, saphenous vein graft  to posterior descending branch of distal circumflex). 2. Endoscopic harvest of right leg greater saphenous vein.  Echo 04/2009 Study Conclusions  1. Left ventricle: Victor cavity size was below normal. Wall thickness  was increased in a pattern of mild LVH. Systolic function was  vigorous. Victor estimated ejection fraction was in Victor range of 65%  to 70%. 2. Pulmonary arteries: PA peak pressure: 56mm Hg (S).  Lab Results  Basic Metabolic Panel:  Recent Labs Lab 02/22/16 0006  02/22/16 0356  NA 137 140  K 4.4 4.2  CL 108 113*  CO2 20* 20*  GLUCOSE 127* 58*  BUN 58* 52*  CREATININE 2.87* 2.61*  CALCIUM 8.8* 8.2*    Liver Function Tests:  Recent Labs Lab 02/22/16 0032 02/22/16 0356  AST 17 16  ALT 12* 11*  ALKPHOS 63 50  BILITOT 0.8 0.7  PROT 7.7 6.3*  ALBUMIN 3.9 3.3*    CBC:  Recent Labs Lab 02/22/16 0006 02/22/16 0356  WBC 12.7* 11.2*  HGB 11.1* 10.1*  HCT  33.7* 30.6*  MCV 95.7 96.5  PLT 130* 124*    Cardiac Enzymes:   Radiology: Dg Chest 2 View  02/22/2016  CLINICAL DATA:  Shortness of breath. Dry cough, sore throat and fever. EXAM: CHEST  2 VIEW COMPARISON:  06/12/2015 FINDINGS: Nguyen is post median sternotomy and CABG. Cardiomegaly and vascular congestion are unchanged from prior. Minimal linear atelectasis at both lung bases. No focal opacity, previous right upper lobe opacity has resolved. No pleural effusion or pneumothorax. IMPRESSION: Stable cardiomegaly and vascular congestion. Mild bibasilar atelectasis, no evidence of pneumonia. Electronically Signed   By: Jeb Levering M.D.   On: 02/22/2016 01:28     ECG: Sinus tachycardia with intraventricular conduction delay, rate of 102 bpm.   Impression and Recommendations  1. Acute CHF: Likely diastolic with abdominal distention, lower extremity edema, but may be mixed with associated dyspnea. Echocardiogram will be completed to evaluate LV systolic function. In Victor interim we will start IV Lasix 40 mg twice a day, potassium replacement, strict I&O, daily weight, creatinine is elevated, . Hopefully will improve with diuresis. Follow labs. We will recommendations once echocardiogram is completed for medical management. If significant change in LV systolic function,may consider transfer to [facility] for catheterization, once renal function has improved. Follow closely.   2. Elevated troponin: May be related to sepsis versus chronic kidney disease. EKG has no evidence of acute ST-T wave changes. We will continue to cycle cardiac enzymes.  3. Coronary artery disease, with coronary artery bypass grafting in 2010: He has been lost to follow-up with cardiology. Had been normally seen in [facility] by Dr. Percival Spanish. Nguyen had four-vessel CABG in 2010. Review of home medications. Has him on metoprolol 25 mg twice a day, simvastatin 80 mg at bedtime, lisinopril 20 mg daily, along with aspirin 81 mg daily. Due  to hypotension, [name] was taken off on metoprolol and ACE inhibitor and amlodipine.Marland Kitchen He was given IV fluids. Blood pressure is low normal currently. Heart rate in Victor 40s to 90s. May add low-dose metoprolol 12.5 mg twice a day . Once blood pressure normalizes.  4. Diabetes: Currently on insulin. Blood glucose was not significantly elevated on admission.   5. Sepsis: Blood cultures are pending. He is negative for MRSA, urine cultures pending. He is currently on antibiotic therapy . He is afebrile.   6. Chronic kidney disease: Review of labs from 2010, to present reveals a creatinine average  of 1.8, however, on admission , creatinine 2.87. GFR currently 22 with prior GFR 36-39.   [name] was seen and examined, and I agree with Victor assessment and plan as documented above, with modifications as noted below. [name] admitted with hypotension and urosepsis with troponin elevation in Victor context of prior coronary artery bypass surgery. [name] and daughter say [name] has been short of breath with exertion for Victor past 2 years. Has not seen a cardiologist in several years. Has had some mild chest tightness.  Admits to leg swelling and abdominal distention. CXR shows vascular congestion which is unchanged. Most recent troponin 1.2. ECG shows sinus tachycardia.  Given poor renal function in Victor context of hypotension and urosepsis, not an optimal candidate for coronary angiography. Additionally, this may be more indicative of demand ischemia rather than NSTEMI. Would not heparinize at this point. While he has leg swelling and vascular congestion, noted to be chronic. Given hypotension, will gently diurese with IV Lasix 20 mg daily. Continue ASA and Lipitor. Will follow up echocardiogram.  Kate Sable, MD, [facility]  02/22/2016 3:38 PM

## 2016-02-22 NOTE — Progress Notes (Signed)
Results for [name], [name] (MRN [id number]) as of 02/22/2016 12:55  Ref. Range 02/22/2016 00:06 02/22/2016 03:56  Glucose Latest Ref Range: 65-99 mg/dL 127 (H) 58 (L)  Pt state that he took his home dose of insulin last night before coming to the hospital.  02/22/16 Home DM meds:  Pt states that he takes Lantus 80 units daily at bedtime (about 11:30pm)  He also takes 20 units 70/30 at bedtime and 55 units 70/30 in the morning as prescribed by his doctor in [place]. Hospital DM meds:  Lantus 80 units daily and sensitive correction scale.   Pt requested information for another doctor.  Gave him Dr. Liliane Channel contact information as well as information for OP DM education. [place], CDE. M.Ed. Pager [phone] Inpatient Diabetes Coordinator

## 2016-02-22 NOTE — Progress Notes (Signed)
Progress Note   [name] [id number] DOB: 10-08-1935 DOA: 02/21/2016 PCP: Deloria Lair, MD   Brief Narrative:   [name] is an 81 y.o. male with a PMH of atrial fibrillation, hypertension, hyperlipidemia, CAD, diabetes, and chronic kidney disease who was admitted 02/20/18 with a chief complaint of fever, cough, and sore throat associated with chest pain. Upon presentation, the patient was hypotensive with systolic blood pressure in the 80s. Blood pressure responded appropriately to fluid challenge. Was also found to have mild elevation of troponin to 0.10. Chest x-ray was negative for pneumonia. Influenza screen was negative.  Assessment/Plan:   Principal problem:   Suspected sepsis with hypotension  - Patient was provided with aggressive fluid volume resuscitation on admission. Sepsis criteria met.  - Source likely from urinary source. Urine microscopy showed too numerous to count WBC and many bacteria.  - Chest x-ray negative for pneumonia. Influenza panel negative. - Discontinue vancomycin. Narrow Zosyn to Rocephin.   Active Problems:   Acute, likely diastolic CHF - KVO IV fluids and give Lasix 40 mg IV BID.    CAD, ARTERY BYPASS GRAFT with unstable anginaAnd elevated troponin  - Known history of severe double vessel disease. Continue aspirin & statin. - Cardiology following with plans for consideration of PCI versus repeat CABG.  - EKG without evidence of acute ST-T wave changes. Continue cycle cardiac enzymes.     Acute kidney injury/stage III chronic kidney disease  - Baseline creatinine approximately 1.8. Current creatinine elevated over usual baseline values of 2.87.     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    DVT Prophylaxis - Lovenox ordered.   Family Communication/Anticipated D/C date and plan/Code Status   Family Communication: Wife at the bedside.   Disposition Plan/date: Home when stable. Code Status:  Full code.    IV Access:    Peripheral IV   Procedures and diagnostic studies:   Dg Chest 2 View  02/22/2016  CLINICAL DATA:  Shortness of breath. Dry cough, sore throat and fever. EXAM: CHEST  2 VIEW COMPARISON:  06/12/2015 FINDINGS: Patient is post median sternotomy and CABG. Cardiomegaly and vascular congestion are unchanged from prior. Minimal linear atelectasis at both lung bases. No focal opacity, previous right upper lobe opacity has resolved. No pleural effusion or pneumothorax. IMPRESSION: Stable cardiomegaly and vascular congestion. Mild bibasilar atelectasis, no evidence of pneumonia. Electronically Signed   By: Jeb Levering M.D.   On: 02/22/2016 01:28     Medical Consultants:    None.  Anti-Infectives:   Anti-infectives    Start     Dose/Rate Route Frequency Ordered Stop   02/22/16 0030  piperacillin-tazobactam (ZOSYN) IVPB 3.375 g     3.375 g 100 mL/hr over 30 Minutes Intravenous  Once 02/22/16 0025 02/22/16 0214   02/22/16 0030  vancomycin (VANCOCIN) IVPB 1000 mg/200 mL premix     1,000 mg 200 mL/hr over 60 Minutes Intravenous  Once 02/22/16 0025 02/22/16 0175      Subjective:    [name]  is having active respiratory distress. He feels tired and has some chest pressure in the upper right chest. He has had some nausea and vomited once this morning.   Objective:    Filed Vitals:   02/22/16 0315 02/22/16 0327 02/22/16 0400 02/22/16 0800  BP:  101/41    Pulse: 57 67    Temp:   97.8  F (36.6 C) 97 F (36.1 C)  TempSrc:   Oral Oral  Resp:  22    Height:   _0  (1.676 m)   Weight:   100.4 kg (221 lb 5.5 oz)   SpO2: 100% 98%      Intake/Output Summary (Last 24 hours) at 02/22/16 0948 Last data filed at 02/22/16 0755  Gross per 24 hour  Intake      0 ml  Output    350 ml  Net   -350 ml   Filed Weights   02/21/16 2346 02/22/16 0400  Weight: 88.451 kg (195 lb) 100.4 kg (221 lb  5.5 oz)    Exam: Gen:  Moderate respiratory distress Cardiovascular:  RRR, No M/R/G Respiratory:  Lungs CTAB Gastrointestinal:  Abdomen soft, NT/ND, + BS Extremities:  1-2+ edema   Data Reviewed:    Labs: Basic Metabolic Panel:  Recent Labs Lab 02/22/16 0006 02/22/16 0356  NA 137 140  K 4.4 4.2  CL 108 113*  CO2 20* 20*  GLUCOSE 127* 58*  BUN 58* 52*  CREATININE 2.87* 2.61*  CALCIUM 8.8* 8.2*   GFR Estimated Creatinine Clearance: 25.9 mL/min (by C-G formula based on Cr of 2.61). Liver Function Tests:  Recent Labs Lab 02/22/16 0032 02/22/16 0356  AST 17 16  ALT 12* 11*  ALKPHOS 63 50  BILITOT 0.8 0.7  PROT 7.7 6.3*  ALBUMIN 3.9 3.3*   CBC:  Recent Labs Lab 02/22/16 0006 02/22/16 0356  WBC 12.7* 11.2*  HGB 11.1* 10.1*  HCT 33.7* 30.6*  MCV 95.7 96.5  PLT 130* 124*   Cardiac Enzymes:  Recent Labs Lab 02/22/16 0006 02/22/16 0356  TROPONINI 0.10* 0.94*   CBG:  Recent Labs Lab 02/22/16 0740 02/22/16 0813  GLUCAP 34* 82   Sepsis Labs:  Recent Labs Lab 02/22/16 0006 02/22/16 0356  WBC 12.7* 11.2*  LATICACIDVEN 1.6 1.1   Microbiology Recent Results (from the past 240 hour(s))  Blood Culture (routine x 2)     Status: None (Preliminary result)   Collection Time: 02/22/16 12:32 AM  Result Value Ref Range Status   Specimen Description BLOOD RIGHT ANTECUBITAL  Final   Special Requests   Final    BOTTLES DRAWN AEROBIC AND ANAEROBIC AEB 10CC ANA 4CC   Culture PENDING  Incomplete   Report Status PENDING  Incomplete  Blood Culture (routine x 2)     Status: None (Preliminary result)   Collection Time: 02/22/16 12:43 AM  Result Value Ref Range Status   Specimen Description BLOOD LEFT HAND  Final   Special Requests BOTTLES DRAWN AEROBIC ONLY 10CC  Final   Culture PENDING  Incomplete   Report Status PENDING  Incomplete  MRSA PCR Screening     Status: None   Collection Time: 02/22/16  3:39 AM  Result Value Ref Range Status   MRSA by PCR  NEGATIVE NEGATIVE Final    Comment:        The GeneXpert MRSA Assay (FDA approved for NASAL specimens only), is one component of a comprehensive MRSA colonization surveillance program. It is not intended to diagnose MRSA infection nor to guide or monitor treatment for MRSA infections.      Medications:   . aspirin EC  81 mg Oral Daily  . atorvastatin  40 mg Oral q1800  . enoxaparin (LOVENOX) injection  30 mg Subcutaneous Q24H  . insulin aspart  0-9 Units Subcutaneous TID WC  . insulin glargine  80 Units Subcutaneous QHS  . levothyroxine  50 mcg Oral QAC breakfast   Continuous Infusions: . sodium chloride 1,000 mL (02/22/16 0359)    Time spent: 35 minutes.  The patient is medically complex with multiple co-morbidities and is at high risk for clinical deterioration and requires high complexity decision making.    LOS: 0 days   [name]  [facility] Pager [phone]. If unable to reach me by pager, please call my cell phone at [phone].  *Please refer to [email or web address], password [email or web address] to get updated schedule on who will round on this patient, as hospitalists switch teams weekly. If 7PM-7AM, please contact night-coverage at [email or web address], password [email or web address] for any overnight needs.  02/22/2016, 9:48 AM

## 2016-02-22 NOTE — Care Management Important Message (Signed)
Important Message  Patient Details  Name: [name] MRN: [id number] Date of Birth: 02-13-1935   Medicare Important Message Given:  Yes    Sherald Barge, RN 02/22/2016, 1:44 PM

## 2016-02-22 NOTE — H&P (Addendum)
PCP:   Deloria Lair, MD   Chief Complaint:  Fever  HPI:  81 year old male who  has a past medical history of Afib ([place]); Hypertension; Hyperlipidemia; Coronary artery disease; Diabetes mellitus; Renal insufficiency; and Renal insufficiency. Today presents to the hospital with chief complaint of fever, cough, sore throat which started this morning. Also patient had chest pain which lasted for a few minutes. Patient also complains of worsening shortness of breath. He also complains of dysuria for past few days. Denies nausea vomiting or diarrhea. No headache no abdominal pain. In the ED patient became hypotensive with systolic blood pressure dropped into 80s. Blood pressure has responded well to fluid challenge. Also has mild elevation of troponin 0.10. UA shows significant WBCs. Chest x-ray clear  Allergies:  No Known Allergies    Past Medical History  Diagnosis Date  . Afib ([place])   . Hypertension   . Hyperlipidemia   . Coronary artery disease   . Diabetes mellitus   . Renal insufficiency   . Renal insufficiency     Past Surgical History  Procedure Laterality Date  . Coronary artery bypass graft    . Carotid insuff      Prior to Admission medications   Medication Sig Start Date End Date Taking? Authorizing Provider  AMLODIPINE BESYLATE PO Take 5 mg by mouth daily.    Historical Provider, MD  aspirin 81 MG tablet Take 81 mg by mouth daily.    Historical Provider, MD  eszopiclone (LUNESTA) 2 MG TABS Take 2 mg by mouth at bedtime. Take immediately before bedtime    Historical Provider, MD  HYDROcodone-acetaminophen (VICODIN) 5-500 MG per tablet Take 1 tablet by mouth every 6 (six) hours as needed.    Historical Provider, MD  insulin glargine (LANTUS) 100 UNIT/ML injection Inject 80 Units into the skin at bedtime.    Historical Provider, MD  insulin NPH-insulin regular (NOVOLIN 70/30) (70-30) 100 UNIT/ML injection Inject 30 Units into the skin 2 (two) times daily with a meal.     Historical Provider, MD  levothyroxine (SYNTHROID, LEVOTHROID) 50 MCG tablet Take 50 mcg by mouth daily.    Historical Provider, MD  lisinopril (PRINIVIL,ZESTRIL) 20 MG tablet Take 20 mg by mouth daily.    Historical Provider, MD  METOPROLOL TARTRATE PO Take 25 mg by mouth 2 (two) times daily.    Historical Provider, MD  nitroGLYCERIN (NITROSTAT) 0.4 MG SL tablet Place 0.4 mg under the tongue every 5 (five) minutes as needed.    Historical Provider, MD  simvastatin (ZOCOR) 80 MG tablet Take 80 mg by mouth at bedtime.    Historical Provider, MD    Social History:  reports that he has quit smoking. He does not have any smokeless tobacco history on file. He reports that he drinks alcohol. His drug history is not on file.  No family history of cancer or heart disease  Filed Weights   02/21/16 2346  Weight: 88.451 kg (195 lb)    All the positives are listed in BOLD  Review of Systems:  HEENT: Headache, blurred vision, runny nose, sore throat Neck: Hypothyroidism, hyperthyroidism,,lymphadenopathy Chest : Shortness of breath, history of COPD, Asthma Heart : Chest pain, history of coronary arterey disease GI:  Nausea, vomiting, diarrhea, constipation, GERD GU: Dysuria, urgency, frequency of urination, hematuria Neuro: Stroke, seizures, syncope Psych: Depression, anxiety, hallucinations   Physical Exam: Blood pressure 108/89, pulse 81, temperature 100 F (37.8 C), temperature source Oral, resp. rate 21, height 5' 6.5" (1.689 m),  weight 88.451 kg (195 lb), SpO2 97 %. Constitutional:   Patient is a well-developed and well-nourished male in no acute distress and cooperative with exam. Head: Normocephalic and atraumatic Mouth: Mucus membranes moist Eyes: PERRL, EOMI, conjunctivae normal Neck: Supple, No Thyromegaly Cardiovascular: RRR, S1 normal, S2 normal Pulmonary/Chest: CTAB, no wheezes, rales, or rhonchi Abdominal: Soft. Non-tender, non-distended, bowel sounds are normal, no  masses, organomegaly, or guarding present.  Neurological: A&O x3, Strength is normal and symmetric bilaterally, cranial nerve II-XII are grossly intact, no focal motor deficit, sensory intact to light touch bilaterally. Extremities- no cyanosis/clubbing. Has 1+ pitting edema of the lower extremities  Labs on Admission:  Basic Metabolic Panel:  Recent Labs Lab 02/22/16 0006  NA 137  K 4.4  CL 108  CO2 20*  GLUCOSE 127*  BUN 58*  CREATININE 2.87*  CALCIUM 8.8*   Liver Function Tests:  Recent Labs Lab 02/22/16 0032  AST 17  ALT 12*  ALKPHOS 63  BILITOT 0.8  PROT 7.7  ALBUMIN 3.9   CBC:  Recent Labs Lab 02/22/16 0006  WBC 12.7*  HGB 11.1*  HCT 33.7*  MCV 95.7  PLT 130*   Cardiac Enzymes:  Recent Labs Lab 02/22/16 0006  TROPONINI 0.10*    BNP (last 3 results)  Recent Labs  02/22/16 0032  BNP 78.0     Radiological Exams on Admission: Dg Chest 2 View  02/22/2016  CLINICAL DATA:  Shortness of breath. Dry cough, sore throat and fever. EXAM: CHEST  2 VIEW COMPARISON:  06/12/2015 FINDINGS: Patient is post median sternotomy and CABG. Cardiomegaly and vascular congestion are unchanged from prior. Minimal linear atelectasis at both lung bases. No focal opacity, previous right upper lobe opacity has resolved. No pleural effusion or pneumothorax. IMPRESSION: Stable cardiomegaly and vascular congestion. Mild bibasilar atelectasis, no evidence of pneumonia. Electronically Signed   By: Jeb Levering M.D.   On: 02/22/2016 01:28    EKG: Independently reviewed. Sinus tachycardia   Assessment/Plan Active Problems:   CAD, ARTERY BYPASS GRAFT   Atrial fibrillation (HCC)   Sepsis ([place])   Diabetes mellitus ([place])     Sepsis Patient presented with fever of 103, respiratory rate greater then 20, WBC 12.7, developed hypotension with acute kidney injury creatinine 2.87, UA showed WBC. Patient started on vancomycin and Zosyn per sepsis protocol. Follow blood and urine  culture. Influenza PCR is negative Patient's blood pressure has now improved , continue IV fluids normal saline at 125 ML per hour.   Elevated troponin Patient has mild elevation of troponin 0.10, likely from demand ischemia from hypotension Will cycle troponin every 6 hours 3  History of CAD, status post CABG Patient denies any chest pain at this time, hold metoprolol due to hypotension Continue aspirin, Zocor  Diabetes mellitus We'll start sliding scale insulin with NovoLog, continue Lantus 80 units subcutaneous daily  Paroxysmal atrial fibrillation [place] 2 VASC  score is 5  Patient not on anticoagulation, continue aspirin Heart rate is controlled, when patient's blood pressure stabilized consider restarting metoprolol  DVT prophylaxis Lovenox  Code status: Full code  Family discussion: Admission, patients condition and plan of care including tests being ordered have been discussed with the patient and her daughter at bedside who indicate understanding and agree with the plan and Code Status.   Time Spent on Admission: 55 min  [facility] Pager: [phone] 02/22/2016, 3:18 AM  If 7PM-7AM, please contact night-coverage  [email or web address]  Password [email or web address]

## 2016-02-23 LAB — HEMOGLOBIN A1C
HEMOGLOBIN A1C: 7.4 % — AB (ref 4.8–5.6)
Mean Plasma Glucose: 166 mg/dL

## 2016-02-23 LAB — CBC WITH DIFFERENTIAL/PLATELET
Basophils Absolute: 0 10*3/uL (ref 0.0–0.1)
Basophils Relative: 0 %
EOS PCT: 0 %
Eosinophils Absolute: 0 10*3/uL (ref 0.0–0.7)
HCT: 30.2 % — ABNORMAL LOW (ref 39.0–52.0)
Hemoglobin: 10 g/dL — ABNORMAL LOW (ref 13.0–17.0)
LYMPHS ABS: 0.8 10*3/uL (ref 0.7–4.0)
LYMPHS PCT: 6 %
MCH: 32.1 pg (ref 26.0–34.0)
MCHC: 33.1 g/dL (ref 30.0–36.0)
MCV: 96.8 fL (ref 78.0–100.0)
MONO ABS: 1.3 10*3/uL — AB (ref 0.1–1.0)
MONOS PCT: 10 %
Neutro Abs: 11.6 10*3/uL — ABNORMAL HIGH (ref 1.7–7.7)
Neutrophils Relative %: 85 %
PLATELETS: 119 10*3/uL — AB (ref 150–400)
RBC: 3.12 MIL/uL — ABNORMAL LOW (ref 4.22–5.81)
RDW: 12.7 % (ref 11.5–15.5)
WBC: 13.7 10*3/uL — ABNORMAL HIGH (ref 4.0–10.5)

## 2016-02-23 LAB — GLUCOSE, CAPILLARY
GLUCOSE-CAPILLARY: 224 mg/dL — AB (ref 65–99)
GLUCOSE-CAPILLARY: 261 mg/dL — AB (ref 65–99)
Glucose-Capillary: 192 mg/dL — ABNORMAL HIGH (ref 65–99)
Glucose-Capillary: 224 mg/dL — ABNORMAL HIGH (ref 65–99)

## 2016-02-23 LAB — BASIC METABOLIC PANEL
ANION GAP: 8 (ref 5–15)
BUN: 50 mg/dL — AB (ref 6–20)
CO2: 21 mmol/L — AB (ref 22–32)
Calcium: 8.3 mg/dL — ABNORMAL LOW (ref 8.9–10.3)
Chloride: 107 mmol/L (ref 101–111)
Creatinine, Ser: 2.6 mg/dL — ABNORMAL HIGH (ref 0.61–1.24)
GFR calc Af Amer: 26 mL/min — ABNORMAL LOW (ref >60)
GFR, EST NON AFRICAN AMERICAN: 22 mL/min — AB (ref >60)
GLUCOSE: 229 mg/dL — AB (ref 65–99)
Potassium: 4.6 mmol/L (ref 3.5–5.1)
Sodium: 136 mmol/L (ref 135–145)

## 2016-02-23 MED ORDER — ONDANSETRON HCL 4 MG/2ML IJ SOLN
4.0000 mg | Freq: Four times a day (QID) | INTRAMUSCULAR | Status: DC | PRN
  Administered 2016-02-23 – 2016-02-25 (×4): 4 mg via INTRAVENOUS
  Filled 2016-02-23 (×4): qty 2

## 2016-02-23 MED ORDER — VANCOMYCIN HCL IN DEXTROSE 1-5 GM/200ML-% IV SOLN
1000.0000 mg | INTRAVENOUS | Status: DC
  Administered 2016-02-23: 1000 mg via INTRAVENOUS
  Filled 2016-02-23: qty 200

## 2016-02-23 NOTE — Progress Notes (Signed)
Pharmacy Antibiotic Note  [name] is a 81 y.o. male admitted on 02/21/2016 with sepsis.  Pharmacy has been consulted for Vancomycin dosing.  Plan: Vancomycin 1000 IV every 24 hours.  Goal trough 15-20 mcg/mL. Monitor labs, micro and vitals.   Height: 5\' 6"  (167.6 cm) Weight: 210 lb 12.2 oz (95.6 kg) IBW/kg (Calculated) : 63.8  Temp (24hrs), Avg:99 F (37.2 C), Min:97.7 F (36.5 C), Max:101.6 F (38.7 C)   Recent Labs Lab 02/22/16 0006 02/22/16 0356 02/23/16 0444  WBC 12.7* 11.2* 13.7*  CREATININE 2.87* 2.61* 2.60*  LATICACIDVEN 1.6 1.1  --     Estimated Creatinine Clearance: 25.3 mL/min (by C-G formula based on Cr of 2.6).    No Known Allergies  Antimicrobials this admission: Vanc 3/10 >>  Zosyn 3/10 >>3/10 Ceftriaxone 3/10>>   Dose adjustments this admission: n/a  Microbiology results: 3/10 BCx: GPC 3/10 UCx: pending  3/10 MRSA PCR: (-)  Thank you for allowing pharmacy to be a part of this patient's care.  Isac Sarna, BS Pharm D, [place] Clinical Pharmacist Pager [phone] 02/23/2016 1:29 PM

## 2016-02-23 NOTE — Progress Notes (Signed)
Progress Note   [name] OFH:[id number] DOB: 12/12/1935 DOA: 02/21/2016 PCP: Deloria Lair, MD   Brief Narrative:   [name] is an 81 y.o. male with a PMH of atrial fibrillation, hypertension, hyperlipidemia, CAD, diabetes, and chronic kidney disease who was admitted 02/20/18 with a chief complaint of fever, cough, and sore throat associated with chest pain. Upon presentation, the patient was hypotensive with systolic blood pressure in the 80s. Blood pressure responded appropriately to fluid challenge. Was also found to have mild elevation of troponin to 0.10. Chest x-ray was negative for pneumonia. Influenza screen was negative.  Assessment/Plan:   sepsis with hypotension due to UTI - Patient was provided with aggressive fluid volume resuscitation on admission. Sepsis criteria met.  - Source likely from urinary source. Urine microscopy showed too numerous to count WBC and many bacteria await culture - Chest x-ray negative for pneumonia. Influenza panel negative. -  Narrow Zosyn to Rocephin- await cultures  1 blood culture positive for gram + cocci- 2nd has not yet resulted -continue vanc -repeat blood culture in AM    Acute, likely diastolic CHF - KVO IV fluids and give Lasix 40 mg IV BID. -appreciate cardiology    CAD, ARTERY BYPASS GRAFT with unstable anginaAnd elevated troponin  - Known history of severe double vessel disease. Continue aspirin & statin. - Cardiology following with plans for consideration of PCI versus repeat CABG.  - EKG without evidence of acute ST-T wave changes. Continue cycle cardiac enzymes.     Acute kidney injury/stage III chronic kidney disease  - Baseline creatinine approximately 1.8.  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    DVT Prophylaxis - Lovenox ordered.   Family Communication/Anticipated D/C date and plan/Code  Status   Family Communication: Wife at the bedside.  Disposition Plan/date: PT eval, transfer to floor Code Status:  Full code.    IV Access:    Peripheral IV   Procedures and diagnostic studies:   Dg Chest 2 View  02/22/2016  CLINICAL DATA:  Shortness of breath. Dry cough, sore throat and fever. EXAM: CHEST  2 VIEW COMPARISON:  06/12/2015 FINDINGS: Patient is post median sternotomy and CABG. Cardiomegaly and vascular congestion are unchanged from prior. Minimal linear atelectasis at both lung bases. No focal opacity, previous right upper lobe opacity has resolved. No pleural effusion or pneumothorax. IMPRESSION: Stable cardiomegaly and vascular congestion. Mild bibasilar atelectasis, no evidence of pneumonia. Electronically Signed   By: Jeb Levering M.D.   On: 02/22/2016 01:28     Medical Consultants:    cardiology  Anti-Infectives:   Anti-infectives    Start     Dose/Rate Route Frequency Ordered Stop   02/23/16 0200  vancomycin (VANCOCIN) IVPB 1000 mg/200 mL premix  Status:  Discontinued     1,000 mg 200 mL/hr over 60 Minutes Intravenous Every 24 hours 02/22/16 0954 02/22/16 1332   02/23/16 0200  vancomycin (VANCOCIN) IVPB 1000 mg/200 mL premix  Status:  Discontinued     1,000 mg 200 mL/hr over 60 Minutes Intravenous Every 24 hours 02/22/16 2357 02/23/16 0730   02/22/16 1345  cefTRIAXone (ROCEPHIN) 1 g in dextrose 5 % 50 mL IVPB     1 g 100 mL/hr over 30 Minutes Intravenous Every 24 hours 02/22/16 1332     02/22/16 1000  piperacillin-tazobactam (ZOSYN) IVPB 3.375 g  Status:  Discontinued  3.375 g 12.5 mL/hr over 240 Minutes Intravenous Every 8 hours 02/22/16 0954 02/22/16 1332   02/22/16 0030  piperacillin-tazobactam (ZOSYN) IVPB 3.375 g     3.375 g 100 mL/hr over 30 Minutes Intravenous  Once 02/22/16 0025 02/22/16 0214   02/22/16 0030  vancomycin (VANCOCIN) IVPB 1000 mg/200 mL premix     1,000 mg 200 mL/hr over 60 Minutes Intravenous  Once 02/22/16 0025  02/22/16 0318      Subjective:    Feeling much better today than yesterday  Objective:    Filed Vitals:   02/23/16 0600 02/23/16 0814 02/23/16 0913 02/23/16 1210  BP: 135/57     Pulse: 64     Temp:  99.2 F (37.3 C)  97.8 F (36.6 C)  TempSrc:  Oral  Oral  Resp: 24     Height:      Weight:      SpO2: 99%  99%     Intake/Output Summary (Last 24 hours) at 02/23/16 1249 Last data filed at 02/23/16 0924  Gross per 24 hour  Intake    240 ml  Output   1320 ml  Net  -1080 ml   Filed Weights   02/21/16 2346 02/22/16 0400 02/23/16 0400  Weight: 88.451 kg (195 lb) 100.4 kg (221 lb 5.5 oz) 95.6 kg (210 lb 12.2 oz)    Exam: Gen:  NAD Cardiovascular:  RRR, No M/R/G Respiratory:  Lungs CTAB Gastrointestinal:  Abdomen soft, NT/ND, + BS Extremities:  1+ edema   Data Reviewed:    Labs: Basic Metabolic Panel:  Recent Labs Lab 02/22/16 0006 02/22/16 0356 02/23/16 0444  NA 137 140 136  K 4.4 4.2 4.6  CL 108 113* 107  CO2 20* 20* 21*  GLUCOSE 127* 58* 229*  BUN 58* 52* 50*  CREATININE 2.87* 2.61* 2.60*  CALCIUM 8.8* 8.2* 8.3*   GFR Estimated Creatinine Clearance: 25.3 mL/min (by C-G formula based on Cr of 2.6). Liver Function Tests:  Recent Labs Lab 02/22/16 0032 02/22/16 0356  AST 17 16  ALT 12* 11*  ALKPHOS 63 50  BILITOT 0.8 0.7  PROT 7.7 6.3*  ALBUMIN 3.9 3.3*   CBC:  Recent Labs Lab 02/22/16 0006 02/22/16 0356 02/23/16 0444  WBC 12.7* 11.2* 13.7*  NEUTROABS  --   --  11.6*  HGB 11.1* 10.1* 10.0*  HCT 33.7* 30.6* 30.2*  MCV 95.7 96.5 96.8  PLT 130* 124* 119*   Cardiac Enzymes:  Recent Labs Lab 02/22/16 0006 02/22/16 0356 02/22/16 0933 02/22/16 1653 02/22/16 2220  TROPONINI 0.10* 0.94* 1.20* 1.34* 0.98*   CBG:  Recent Labs Lab 02/22/16 1214 02/22/16 1617 02/22/16 2212 02/23/16 0727 02/23/16 1156  GLUCAP 102* 107* 186* 192* 224*   Sepsis Labs:  Recent Labs Lab 02/22/16 0006 02/22/16 0356 02/23/16 0444  WBC 12.7*  11.2* 13.7*  LATICACIDVEN 1.6 1.1  --    Microbiology Recent Results (from the past 240 hour(s))  Blood Culture (routine x 2)     Status: None (Preliminary result)   Collection Time: 02/22/16 12:32 AM  Result Value Ref Range Status   Specimen Description BLOOD RIGHT ANTECUBITAL  Final   Special Requests   Final    BOTTLES DRAWN AEROBIC AND ANAEROBIC AEB 10CC ANA 4CC   Culture  Setup Time   Final    GRAM POSITIVE COCCI Gram Stain Report Called to,Read Back By and Verified With: DANIELS,J. AT 2200 ON 02/22/2016 BY AGUNDIZ,E.   Culture PENDING  Incomplete   Report Status PENDING  Incomplete  Blood Culture (routine x 2)     Status: None (Preliminary result)   Collection Time: 02/22/16 12:43 AM  Result Value Ref Range Status   Specimen Description BLOOD LEFT HAND  Final   Special Requests BOTTLES DRAWN AEROBIC ONLY 10CC  Final   Culture PENDING  Incomplete   Report Status PENDING  Incomplete  MRSA PCR Screening     Status: None   Collection Time: 02/22/16  3:39 AM  Result Value Ref Range Status   MRSA by PCR NEGATIVE NEGATIVE Final    Comment:        The GeneXpert MRSA Assay (FDA approved for NASAL specimens only), is one component of a comprehensive MRSA colonization surveillance program. It is not intended to diagnose MRSA infection nor to guide or monitor treatment for MRSA infections.      Medications:   . albuterol  2.5 mg Nebulization TID  . aspirin EC  81 mg Oral Daily  . atorvastatin  40 mg Oral q1800  . cefTRIAXone (ROCEPHIN)  IV  1 g Intravenous Q24H  . enoxaparin (LOVENOX) injection  30 mg Subcutaneous Q24H  . furosemide  20 mg Intravenous Daily  . insulin aspart  0-9 Units Subcutaneous TID WC  . insulin glargine  80 Units Subcutaneous QHS  . levothyroxine  50 mcg Oral QAC breakfast  . potassium chloride  20 mEq Oral Daily   Continuous Infusions: . sodium chloride 1,000 mL (02/22/16 0930)    Time spent: 25 minutes.     LOS: 1 day   [facility] [phone]  02/23/2016, 12:49 PM

## 2016-02-24 DIAGNOSIS — R7881 Bacteremia: Secondary | ICD-10-CM

## 2016-02-24 LAB — CBC
HCT: 33.2 % — ABNORMAL LOW (ref 39.0–52.0)
HEMOGLOBIN: 11 g/dL — AB (ref 13.0–17.0)
MCH: 31.5 pg (ref 26.0–34.0)
MCHC: 33.1 g/dL (ref 30.0–36.0)
MCV: 95.1 fL (ref 78.0–100.0)
Platelets: 133 10*3/uL — ABNORMAL LOW (ref 150–400)
RBC: 3.49 MIL/uL — AB (ref 4.22–5.81)
RDW: 12.3 % (ref 11.5–15.5)
WBC: 11.2 10*3/uL — AB (ref 4.0–10.5)

## 2016-02-24 LAB — GLUCOSE, CAPILLARY
GLUCOSE-CAPILLARY: 216 mg/dL — AB (ref 65–99)
GLUCOSE-CAPILLARY: 303 mg/dL — AB (ref 65–99)
Glucose-Capillary: 195 mg/dL — ABNORMAL HIGH (ref 65–99)
Glucose-Capillary: 295 mg/dL — ABNORMAL HIGH (ref 65–99)

## 2016-02-24 LAB — URINE CULTURE

## 2016-02-24 LAB — BASIC METABOLIC PANEL
ANION GAP: 9 (ref 5–15)
BUN: 46 mg/dL — AB (ref 6–20)
CHLORIDE: 106 mmol/L (ref 101–111)
CO2: 21 mmol/L — ABNORMAL LOW (ref 22–32)
Calcium: 8.8 mg/dL — ABNORMAL LOW (ref 8.9–10.3)
Creatinine, Ser: 2.24 mg/dL — ABNORMAL HIGH (ref 0.61–1.24)
GFR calc Af Amer: 31 mL/min — ABNORMAL LOW (ref >60)
GFR calc non Af Amer: 26 mL/min — ABNORMAL LOW (ref >60)
GLUCOSE: 251 mg/dL — AB (ref 65–99)
POTASSIUM: 4.2 mmol/L (ref 3.5–5.1)
SODIUM: 136 mmol/L (ref 135–145)

## 2016-02-24 MED ORDER — ALBUTEROL SULFATE (2.5 MG/3ML) 0.083% IN NEBU
2.5000 mg | INHALATION_SOLUTION | Freq: Two times a day (BID) | RESPIRATORY_TRACT | Status: DC
  Administered 2016-02-24 – 2016-02-25 (×3): 2.5 mg via RESPIRATORY_TRACT
  Filled 2016-02-24 (×3): qty 3

## 2016-02-24 NOTE — Progress Notes (Signed)
Progress Note   [name] XNA:[id number] DOB: Oct 15, 1935 DOA: 02/21/2016 PCP: Deloria Lair, MD   Brief Narrative:   [name] is an 81 y.o. male with a PMH of atrial fibrillation, hypertension, hyperlipidemia, CAD, diabetes, and chronic kidney disease who was admitted 02/20/18 with a chief complaint of fever, cough, and sore throat associated with chest pain. Upon presentation, the patient was hypotensive with systolic blood pressure in the 80s. Blood pressure responded appropriately to fluid challenge. Was also found to have mild elevation of troponin to 0.10. Chest x-ray was negative for pneumonia. Influenza screen was negative.  Assessment/Plan:   sepsis with hypotension due to UTI- e-coli in urine and prob blood - Patient was provided with aggressive fluid volume resuscitation on admission. Sepsis criteria met.  - Source likely from urinary source. Urine microscopy showed too numerous to count WBC and many bacteria await culture - Chest x-ray negative for pneumonia. Influenza panel negative. -  Narrow Zosyn to Rocephin- ecoli  1 blood culture positive for gram neg rods-- corrected on 3/12 from gram + cocci -d/c vanc -repeat blood culture    Acute, likely diastolic CHF - KVO IV fluids and give Lasix 40 mg IV BID. -appreciate cardiology    CAD, ARTERY BYPASS GRAFT with unstable anginaAnd elevated troponin  - Known history of severe double vessel disease. Continue aspirin & statin. - Cardiology following with plans for consideration of PCI versus repeat CABG.  - EKG without evidence of acute ST-T wave changes. Continue cycle cardiac enzymes.     Acute kidney injury/stage III chronic kidney disease  - Baseline creatinine approximately 1.8.  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    DVT Prophylaxis - Lovenox ordered.   Family Communication/Anticipated  D/C date and plan/Code Status   Family Communication: no family at the bedside.  Disposition Plan/date: PT eval, transfer to floor- OOB to chair- home once eating ebtter Code Status:  Full code.    IV Access:    Peripheral IV   Procedures and diagnostic studies:   Dg Chest 2 View  02/22/2016  CLINICAL DATA:  Shortness of breath. Dry cough, sore throat and fever. EXAM: CHEST  2 VIEW COMPARISON:  06/12/2015 FINDINGS: Patient is post median sternotomy and CABG. Cardiomegaly and vascular congestion are unchanged from prior. Minimal linear atelectasis at both lung bases. No focal opacity, previous right upper lobe opacity has resolved. No pleural effusion or pneumothorax. IMPRESSION: Stable cardiomegaly and vascular congestion. Mild bibasilar atelectasis, no evidence of pneumonia. Electronically Signed   By: Jeb Levering M.D.   On: 02/22/2016 01:28     Medical Consultants:    cardiology  Anti-Infectives:   Anti-infectives    Start     Dose/Rate Route Frequency Ordered Stop   02/23/16 1500  vancomycin (VANCOCIN) IVPB 1000 mg/200 mL premix  Status:  Discontinued     1,000 mg 200 mL/hr over 60 Minutes Intravenous Every 24 hours 02/23/16 1323 02/24/16 1120   02/23/16 0200  vancomycin (VANCOCIN) IVPB 1000 mg/200 mL premix  Status:  Discontinued     1,000 mg 200 mL/hr over 60 Minutes Intravenous Every 24 hours 02/22/16 0954 02/22/16 1332   02/23/16 0200  vancomycin (VANCOCIN) IVPB 1000 mg/200 mL premix  Status:  Discontinued     1,000 mg 200 mL/hr over 60 Minutes Intravenous Every 24 hours 02/22/16 2357 02/23/16 0730  02/22/16 1345  cefTRIAXone (ROCEPHIN) 1 g in dextrose 5 % 50 mL IVPB     1 g 100 mL/hr over 30 Minutes Intravenous Every 24 hours 02/22/16 1332     02/22/16 1000  piperacillin-tazobactam (ZOSYN) IVPB 3.375 g  Status:  Discontinued     3.375 g 12.5 mL/hr over 240 Minutes Intravenous Every 8 hours 02/22/16 0954 02/22/16 1332   02/22/16 0030  piperacillin-tazobactam  (ZOSYN) IVPB 3.375 g     3.375 g 100 mL/hr over 30 Minutes Intravenous  Once 02/22/16 0025 02/22/16 0214   02/22/16 0030  vancomycin (VANCOCIN) IVPB 1000 mg/200 mL premix     1,000 mg 200 mL/hr over 60 Minutes Intravenous  Once 02/22/16 0025 02/22/16 0318      Subjective:    Some nausea this AM- still not eating well  Objective:    Filed Vitals:   02/24/16 0500 02/24/16 0749 02/24/16 0835 02/24/16 1221  BP: 152/65     Pulse: 73     Temp:  98.5 F (36.9 C)  98.7 F (37.1 C)  TempSrc:  Oral  Oral  Resp: 17     Height:      Weight: 95.2 kg (209 lb 14.1 oz)     SpO2: 94%  97%     Intake/Output Summary (Last 24 hours) at 02/24/16 1223 Last data filed at 02/24/16 1221  Gross per 24 hour  Intake    610 ml  Output    600 ml  Net     10 ml   Filed Weights   02/22/16 0400 02/23/16 0400 02/24/16 0500  Weight: 100.4 kg (221 lb 5.5 oz) 95.6 kg (210 lb 12.2 oz) 95.2 kg (209 lb 14.1 oz)    Exam: Gen:  NAD Cardiovascular:  RRR, No M/R/G Respiratory:  Lungs CTAB Gastrointestinal:  Abdomen soft, NT/ND, + BS Extremities:  min edema   Data Reviewed:    Labs: Basic Metabolic Panel:  Recent Labs Lab 02/22/16 0006 02/22/16 0356 02/23/16 0444 02/24/16 0435  NA 137 140 136 136  K 4.4 4.2 4.6 4.2  CL 108 113* 107 106  CO2 20* 20* 21* 21*  GLUCOSE 127* 58* 229* 251*  BUN 58* 52* 50* 46*  CREATININE 2.87* 2.61* 2.60* 2.24*  CALCIUM 8.8* 8.2* 8.3* 8.8*   GFR Estimated Creatinine Clearance: 29.4 mL/min (by C-G formula based on Cr of 2.24). Liver Function Tests:  Recent Labs Lab 02/22/16 0032 02/22/16 0356  AST 17 16  ALT 12* 11*  ALKPHOS 63 50  BILITOT 0.8 0.7  PROT 7.7 6.3*  ALBUMIN 3.9 3.3*   CBC:  Recent Labs Lab 02/22/16 0006 02/22/16 0356 02/23/16 0444 02/24/16 0435  WBC 12.7* 11.2* 13.7* 11.2*  NEUTROABS  --   --  11.6*  --   HGB 11.1* 10.1* 10.0* 11.0*  HCT 33.7* 30.6* 30.2* 33.2*  MCV 95.7 96.5 96.8 95.1  PLT 130* 124* 119* 133*    Cardiac Enzymes:  Recent Labs Lab 02/22/16 0006 02/22/16 0356 02/22/16 0933 02/22/16 1653 02/22/16 2220  TROPONINI 0.10* 0.94* 1.20* 1.34* 0.98*   CBG:  Recent Labs Lab 02/23/16 1156 02/23/16 1610 02/23/16 2142 02/24/16 0726 02/24/16 1118  GLUCAP 224* 224* 261* 195* 216*   Sepsis Labs:  Recent Labs Lab 02/22/16 0006 02/22/16 0356 02/23/16 0444 02/24/16 0435  WBC 12.7* 11.2* 13.7* 11.2*  LATICACIDVEN 1.6 1.1  --   --    Microbiology Recent Results (from the past 240 hour(s))  Blood Culture (routine x 2)  Status: None (Preliminary result)   Collection Time: 02/22/16 12:32 AM  Result Value Ref Range Status   Specimen Description BLOOD RIGHT ANTECUBITAL  Final   Special Requests   Final    BOTTLES DRAWN AEROBIC AND ANAEROBIC AEB 10CC ANA 4CC   Culture  Setup Time   Final    GRAM NEGATIVE RODS AEROBIC BOTTLE ONLY PREVIOUSLY REPORTED AS: GRAM POSITIVE COCCI CORRECTED RESULTS CALLED TO: M GREY 02/24/16 @ 22 M VESTAL    Culture   Final    GRAM NEGATIVE RODS IDENTIFICATION AND SUSCEPTIBILITIES TO FOLLOW Performed at [facility]    Report Status PENDING  Incomplete  Urine culture     Status: None   Collection Time: 02/22/16 12:38 AM  Result Value Ref Range Status   Specimen Description URINE, CLEAN CATCH  Final   Special Requests NONE  Final   Culture   Final    >=100,000 COLONIES/mL ESCHERICHIA COLI Performed at [facility]    Report Status 02/24/2016 FINAL  Final   Organism ID, Bacteria ESCHERICHIA COLI  Final      Susceptibility   Escherichia coli - MIC*    AMPICILLIN >=32 RESISTANT Resistant     CEFAZOLIN <=4 SENSITIVE Sensitive     CEFTRIAXONE <=1 SENSITIVE Sensitive     CIPROFLOXACIN <=0.25 SENSITIVE Sensitive     GENTAMICIN <=1 SENSITIVE Sensitive     IMIPENEM <=0.25 SENSITIVE Sensitive     NITROFURANTOIN <=16 SENSITIVE Sensitive     TRIMETH/SULFA <=20 SENSITIVE Sensitive     AMPICILLIN/SULBACTAM 16 INTERMEDIATE  Intermediate     PIP/TAZO <=4 SENSITIVE Sensitive     * >=100,000 COLONIES/mL ESCHERICHIA COLI  Blood Culture (routine x 2)     Status: None (Preliminary result)   Collection Time: 02/22/16 12:43 AM  Result Value Ref Range Status   Specimen Description BLOOD LEFT HAND  Final   Special Requests BOTTLES DRAWN AEROBIC ONLY 10CC  Final   Culture NO GROWTH 2 DAYS  Final   Report Status PENDING  Incomplete  MRSA PCR Screening     Status: None   Collection Time: 02/22/16  3:39 AM  Result Value Ref Range Status   MRSA by PCR NEGATIVE NEGATIVE Final    Comment:        The GeneXpert MRSA Assay (FDA approved for NASAL specimens only), is one component of a comprehensive MRSA colonization surveillance program. It is not intended to diagnose MRSA infection nor to guide or monitor treatment for MRSA infections.   Culture, blood (Routine X 2) w Reflex to ID Panel     Status: None (Preliminary result)   Collection Time: 02/24/16  4:35 AM  Result Value Ref Range Status   Specimen Description BLOOD LEFT ARM  Final   Special Requests BOTTLES DRAWN AEROBIC AND ANAEROBIC 6CC  Final   Culture NO GROWTH < 12 HOURS  Final   Report Status PENDING  Incomplete  Culture, blood (Routine X 2) w Reflex to ID Panel     Status: None (Preliminary result)   Collection Time: 02/24/16  4:45 AM  Result Value Ref Range Status   Specimen Description BLOOD LEFT ARM  Final   Special Requests BOTTLES DRAWN AEROBIC AND ANAEROBIC 6CC  Final   Culture NO GROWTH < 12 HOURS  Final   Report Status PENDING  Incomplete     Medications:   . albuterol  2.5 mg Nebulization TID  . aspirin EC  81 mg Oral Daily  . atorvastatin  40  mg Oral q1800  . cefTRIAXone (ROCEPHIN)  IV  1 g Intravenous Q24H  . enoxaparin (LOVENOX) injection  30 mg Subcutaneous Q24H  . furosemide  20 mg Intravenous Daily  . insulin aspart  0-9 Units Subcutaneous TID WC  . insulin glargine  80 Units Subcutaneous QHS  . levothyroxine  50 mcg Oral QAC  breakfast  . potassium chloride  20 mEq Oral Daily   Continuous Infusions: . sodium chloride 1,000 mL (02/22/16 0930)    Time spent: 25 minutes.     LOS: 2 days   [facility] [phone]  02/24/2016, 12:23 PM

## 2016-02-24 NOTE — Progress Notes (Signed)
Pt is in no obvious or stated distress. No N&V at this time

## 2016-02-25 DIAGNOSIS — R778 Other specified abnormalities of plasma proteins: Secondary | ICD-10-CM | POA: Insufficient documentation

## 2016-02-25 DIAGNOSIS — A419 Sepsis, unspecified organism: Secondary | ICD-10-CM

## 2016-02-25 DIAGNOSIS — I5032 Chronic diastolic (congestive) heart failure: Secondary | ICD-10-CM

## 2016-02-25 DIAGNOSIS — R7989 Other specified abnormal findings of blood chemistry: Secondary | ICD-10-CM

## 2016-02-25 DIAGNOSIS — I5033 Acute on chronic diastolic (congestive) heart failure: Secondary | ICD-10-CM | POA: Insufficient documentation

## 2016-02-25 LAB — BASIC METABOLIC PANEL
ANION GAP: 9 (ref 5–15)
BUN: 42 mg/dL — ABNORMAL HIGH (ref 6–20)
CHLORIDE: 104 mmol/L (ref 101–111)
CO2: 23 mmol/L (ref 22–32)
Calcium: 9 mg/dL (ref 8.9–10.3)
Creatinine, Ser: 2.26 mg/dL — ABNORMAL HIGH (ref 0.61–1.24)
GFR calc non Af Amer: 26 mL/min — ABNORMAL LOW (ref >60)
GFR, EST AFRICAN AMERICAN: 30 mL/min — AB (ref >60)
Glucose, Bld: 258 mg/dL — ABNORMAL HIGH (ref 65–99)
POTASSIUM: 4.1 mmol/L (ref 3.5–5.1)
SODIUM: 136 mmol/L (ref 135–145)

## 2016-02-25 LAB — GLUCOSE, CAPILLARY
GLUCOSE-CAPILLARY: 218 mg/dL — AB (ref 65–99)
GLUCOSE-CAPILLARY: 231 mg/dL — AB (ref 65–99)
Glucose-Capillary: 215 mg/dL — ABNORMAL HIGH (ref 65–99)
Glucose-Capillary: 193 mg/dL — ABNORMAL HIGH (ref 65–99)

## 2016-02-25 LAB — HEMOGLOBIN A1C
Hgb A1c MFr Bld: 7.1 % — ABNORMAL HIGH (ref 4.8–5.6)
MEAN PLASMA GLUCOSE: 157 mg/dL

## 2016-02-25 MED ORDER — INSULIN GLARGINE 100 UNIT/ML ~~LOC~~ SOLN
85.0000 [IU] | Freq: Every day | SUBCUTANEOUS | Status: DC
  Administered 2016-02-25: 85 [IU] via SUBCUTANEOUS
  Filled 2016-02-25 (×2): qty 0.85

## 2016-02-25 MED ORDER — INSULIN ASPART 100 UNIT/ML ~~LOC~~ SOLN
0.0000 [IU] | Freq: Three times a day (TID) | SUBCUTANEOUS | Status: DC
  Administered 2016-02-25: 3 [IU] via SUBCUTANEOUS
  Administered 2016-02-25: 5 [IU] via SUBCUTANEOUS
  Administered 2016-02-26: 2 [IU] via SUBCUTANEOUS
  Administered 2016-02-26: 5 [IU] via SUBCUTANEOUS

## 2016-02-25 MED ORDER — ALBUTEROL SULFATE (2.5 MG/3ML) 0.083% IN NEBU: 2.5000 mg | INHALATION_SOLUTION | RESPIRATORY_TRACT | Status: DC | PRN

## 2016-02-25 MED ORDER — INSULIN ASPART 100 UNIT/ML ~~LOC~~ SOLN
0.0000 [IU] | Freq: Every day | SUBCUTANEOUS | Status: DC
  Administered 2016-02-25: 2 [IU] via SUBCUTANEOUS

## 2016-02-25 NOTE — Progress Notes (Signed)
**Note De-Identified Erick Murin Obfuscation** Abnormal EKG results reported to RN

## 2016-02-25 NOTE — Evaluation (Signed)
Physical Therapy Evaluation Patient Details Name: [name] MRN: [id number] DOB: 12/14/1935 Today's Date: 02/25/2016   History of Present Illness  Pt is a 81 year old male admitted with sepsis.  He has a hx of CAD, Afib and DM.   When admitted, he c/o dyspnea and chest pain.  He lives with his family and is normally fully independent at home.  Clinical Impression   Pt was seen for evaluation.  He was alert and oriented, very cooperative.  He was found to have mild deconditioning from baseline.  He does report bilateral hypersensitivity of both great toes.  He preferred to not allow his socks removed for inspection due to discomfort from this.  He states that this is a chronic problem.  He is independent with transfers and was able to ambulate in the hallway.  He preferred using a walker for additional security.  He feels that he will be able to progress to baseline once at home without any HHPT.  We will try to follow to facilitate general strengthening and conditioning.    Follow Up Recommendations No PT follow up (per pt preference)    Equipment Recommendations  None recommended by PT    Recommendations for Other Services   none    Precautions / Restrictions Precautions Precautions: Fall Restrictions Weight Bearing Restrictions: No      Mobility  Bed Mobility Overal bed mobility: Modified Independent                Transfers Overall transfer level: Modified independent Equipment used: None                Ambulation/Gait   Ambulation Distance (Feet): 80 Feet Assistive device: Rolling walker (2 wheeled) Gait Pattern/deviations: WFL(Within Functional Limits)   Gait velocity interpretation: <1.8 ft/sec, indicative of risk for recurrent falls General Gait Details: pt is able to ambulate with no assistive device but feels more secure using a walker  Stairs            Wheelchair Mobility    Modified Rankin (Stroke Patients Only)       Balance  Overall balance assessment: No apparent balance deficits (not formally assessed)                                           Pertinent Vitals/Pain Pain Assessment: No/denies pain    Home Living Family/patient expects to be discharged to:: Private residence Living Arrangements: Children Available Help at Discharge: Family;Available 24 hours/day Type of Home: House Home Access: Level entry     Home Layout: One level Home Equipment: Walker - 2 wheels      Prior Function Level of Independence: Independent               Hand Dominance        Extremity/Trunk Assessment               Lower Extremity Assessment: Overall WFL for tasks assessed (does c/o pain in both great toes when touched)      Cervical / Trunk Assessment: Kyphotic  Communication   Communication: No difficulties  Cognition Arousal/Alertness: Awake/alert Behavior During Therapy: WFL for tasks assessed/performed Overall Cognitive Status: Within Functional Limits for tasks assessed                      General Comments      Exercises  General Exercises - Lower Extremity Ankle Circles/Pumps: AROM;Both;5 reps;Supine Heel Slides: AROM;Both;5 reps;Supine Hip ABduction/ADduction: AROM;Both;5 reps;Supine      Assessment/Plan    PT Assessment Patient needs continued PT services  PT Diagnosis Difficulty walking;Generalized weakness   PT Problem List Decreased strength;Decreased activity tolerance;Decreased mobility;Obesity  PT Treatment Interventions Gait training;Functional mobility training;Therapeutic exercise   PT Goals (Current goals can be found in the Care Plan section) Acute Rehab PT Goals Patient Stated Goal: return home to independence PT Goal Formulation: With patient/family Time For Goal Achievement: 03/10/16 Potential to Achieve Goals: Good    Frequency Min 3X/week   Barriers to discharge   none    Co-evaluation               End of Session  Equipment Utilized During Treatment: Gait belt Activity Tolerance: Patient tolerated treatment well Patient left: in chair;with call bell/phone within reach;with family/visitor present Nurse Communication: Mobility status         Time: [id number] PT Time Calculation (min) (ACUTE ONLY): 31 min   Charges:   PT Evaluation $PT Eval Low Complexity: 1 Procedure     PT G CodesOwens Shark, Cecilee Rosner L   PT  02/25/2016, 3:27 PM

## 2016-02-25 NOTE — Progress Notes (Signed)
Report called to unit 300. Patient being transferred to  Room 327.

## 2016-02-25 NOTE — Consult Note (Signed)
Primary Cardiologist: Kate Sable MD  Cardiology Specific Problem List: 1. Demand ischemia 2. CAD with Hx of CABG 3. Diastolic CHF 4. Atrial fibrillation   Subjective:   Continues low grade nausea. Breathing better and feeling less distended.    Objective:   Temp:  [97 F (36.1 C)-98.7 F (37.1 C)] 97 F (36.1 C) (03/13 0400) Pulse Rate:  [69-84] 71 (03/13 0600) Resp:  [14-22] 19 (03/13 0600) BP: (122-163)/(50-86) 122/50 mmHg (03/13 0600) SpO2:  [93 %-98 %] 96 % (03/13 0746) FiO2 (%):  [28 %] 28 % (03/12 2300) Weight:  [205 lb 7.5 oz (93.2 kg)] 205 lb 7.5 oz (93.2 kg) (03/13 0500) Last BM Date: 02/22/16  Filed Weights   02/23/16 0400 02/24/16 0500 02/25/16 0500  Weight: 210 lb 12.2 oz (95.6 kg) 209 lb 14.1 oz (95.2 kg) 205 lb 7.5 oz (93.2 kg)    Intake/Output Summary (Last 24 hours) at 02/25/16 0749 Last data filed at 02/25/16 0200  Gross per 24 hour  Intake    645 ml  Output    250 ml  Net    395 ml    Telemetry: NSR with PAC's rates 80's-90's.   Exam:  General: No acute distress.  HEENT: Conjunctiva and lids normal, oropharynx clear.  Lungs: Clear to auscultation, nonlabored.  Cardiac: No elevated JVP or bruits. RRR, occasional extra systole, no gallop or rub.   Abdomen: Normoactive bowel sounds, nontender, nondistended.  Extremities: No pitting edema, distal pulses full.  Neuropsychiatric: Alert and oriented x3, affect appropriate.  Echocardiogram 02/22/2016 Left ventricle: The cavity size was normal. Wall thickness was  increased in a pattern of moderate LVH. Systolic function was  normal. The estimated ejection fraction was in the range of 60%  to 65%. Images were inadequate for LV wall motion assessment.  Doppler parameters are consistent with abnormal left ventricular  relaxation (grade 1 diastolic dysfunction). - Aortic valve: Trileaflet; mildly thickened, mildly calcified  leaflets. There was no stenosis. - Mitral valve:  Calcified annulus. - Left atrium: The atrium was mildly to moderately dilated.  Lab Results:  Basic Metabolic Panel:  Recent Labs Lab 02/23/16 0444 02/24/16 0435 02/25/16 0515  NA 136 136 136  K 4.6 4.2 4.1  CL 107 106 104  CO2 21* 21* 23  GLUCOSE 229* 251* 258*  BUN 50* 46* 42*  CREATININE 2.60* 2.24* 2.26*  CALCIUM 8.3* 8.8* 9.0    Liver Function Tests:  Recent Labs Lab 02/22/16 0032 02/22/16 0356  AST 17 16  ALT 12* 11*  ALKPHOS 63 50  BILITOT 0.8 0.7  PROT 7.7 6.3*  ALBUMIN 3.9 3.3*    CBC:  Recent Labs Lab 02/22/16 0356 02/23/16 0444 02/24/16 0435  WBC 11.2* 13.7* 11.2*  HGB 10.1* 10.0* 11.0*  HCT 30.6* 30.2* 33.2*  MCV 96.5 96.8 95.1  PLT 124* 119* 133*    Cardiac Enzymes:  Recent Labs Lab 02/22/16 0933 02/22/16 1653 02/22/16 2220  TROPONINI 1.20* 1.34* 0.98*     Medications:   Scheduled Medications: . albuterol  2.5 mg Nebulization BID  . aspirin EC  81 mg Oral Daily  . atorvastatin  40 mg Oral q1800  . cefTRIAXone (ROCEPHIN)  IV  1 g Intravenous Q24H  . enoxaparin (LOVENOX) injection  30 mg Subcutaneous Q24H  . furosemide  20 mg Intravenous Daily  . insulin aspart  0-9 Units Subcutaneous TID WC  . insulin glargine  80 Units Subcutaneous QHS  . levothyroxine  50 mcg Oral QAC  breakfast  . potassium chloride  20 mEq Oral Daily    Infusions: . sodium chloride 1,000 mL (02/25/16 0200)    PRN Medications: acetaminophen **OR** acetaminophen, ondansetron (ZOFRAN) IV   Assessment and Plan:   1.Acute Diastolic CHF: Improved  Weight is down 16 lbs from highest recorded on 02/22/2016. He continues on lasix 20 mg IV BID. A.  WOuld switch to po lasix tomorrow  (40)  Follow I/O   2. Elevated troponin: Peak troponin was 1.34 on 3/10  May represent demand ischemia in setting of sepsis/bacteremia  Currently without symptoms to sugg angina Follow    3. CAD: Hx of CABG: Had CABG in 2010 without further cardiology follow-up. He has no  complaints of chest pain. Breathing status has improved.  As blood pressure is improved, will start low dose metoprolol 12.5 mg BID, continue ASA. No ACE with renal insufficiency.  Pt would not be a good candidate for cath given renal dysfunction  I would recomm medical Rx for now.  If clinically improves and  Renal function imprves could consider ischemic eval in the future    4. Sepsis: Gram negative rods per blood cultureform 3/10  E Coli   Urine with e coli  All further blood cultures have been negative so far  . Antibiotics per Hospitalist service   5. Acute kidney injury with Stage III CKD: Documented baseline creatinine of 1.8. Currently improving slightly from initial creatinine of 2.867 on admission to 2.24 this am.      Phill Myron. [place] NP [place]  02/25/2016, 7:49 AM

## 2016-02-25 NOTE — Progress Notes (Signed)
Progress Note   [name] QMG:[id number] DOB: August 17, 1935 DOA: 02/21/2016 PCP: Deloria Lair, MD   Brief Narrative:   [name] is an 81 y.o. male with a PMH of atrial fibrillation, hypertension, hyperlipidemia, CAD, diabetes, and chronic kidney disease who was admitted 02/20/18 with a chief complaint of fever, cough, and sore throat associated with chest pain. Upon presentation, the patient was hypotensive with systolic blood pressure in the 80s. Blood pressure responded appropriately to fluid challenge. Was also found to have mild elevation of troponin to 0.10. Chest x-ray was negative for pneumonia. Influenza screen was negative.  Assessment/Plan:   sepsis with hypotension due to UTI- e-coli in urine and blood - Patient was provided with aggressive fluid volume resuscitation on admission. Sepsis criteria met.  - Source likely from urinary source. Urine microscopy showed too numerous to count WBC and many bacteria await culture - Chest x-ray negative for pneumonia. Influenza panel negative. -  [place]arrow Zosyn to Rocephin- ecoli  1 blood culture positive for gram neg rods-- corrected on 3/12 from gram + cocci -d/c vanc -repeat blood culture    Acute, likely diastolic CHF - KVO IV fluids and give Lasix 40 mg IV BID. -appreciate cardiology    CAD, ARTERY BYPASS GRAFT with unstable anginaAnd elevated troponin  - Known history of severe double vessel disease. Continue aspirin & statin. - Cardiology following with plans for consideration of PCI versus repeat CABG.  - EKG without evidence of acute ST-T wave changes. Continue cycle cardiac enzymes.     Acute kidney injury/stage III chronic kidney disease  - Baseline creatinine approximately 1.8.  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    DVT Prophylaxis - Lovenox ordered.   Family Communication/Anticipated D/C  date and plan/Code Status   Family Communication: no family at the bedside.  Disposition Plan/date: PT eval, transfer to floor- OOB to chair- home once eating better suspect 1-2 days Code Status:  Full code.    IV Access:    Peripheral IV   Procedures and diagnostic studies:   Dg Chest 2 View  02/22/2016  CLINICAL DATA:  Shortness of breath. Dry cough, sore throat and fever. EXAM: CHEST  2 VIEW COMPARISON:  06/12/2015 FINDINGS: Patient is post median sternotomy and CABG. Cardiomegaly and vascular congestion are unchanged from prior. Minimal linear atelectasis at both lung bases. No focal opacity, previous right upper lobe opacity has resolved. No pleural effusion or pneumothorax. IMPRESSION: Stable cardiomegaly and vascular congestion. Mild bibasilar atelectasis, no evidence of pneumonia. Electronically Signed   By: Jeb Levering M.D.   On: 02/22/2016 01:28     Medical Consultants:    cardiology  Anti-Infectives:   Anti-infectives    Start     Dose/Rate Route Frequency Ordered Stop   02/23/16 1500  vancomycin (VANCOCIN) IVPB 1000 mg/200 mL premix  Status:  Discontinued     1,000 mg 200 mL/hr over 60 Minutes Intravenous Every 24 hours 02/23/16 1323 02/24/16 1120   02/23/16 0200  vancomycin (VANCOCIN) IVPB 1000 mg/200 mL premix  Status:  Discontinued     1,000 mg 200 mL/hr over 60 Minutes Intravenous Every 24 hours 02/22/16 0954 02/22/16 1332   02/23/16 0200  vancomycin (VANCOCIN) IVPB 1000 mg/200 mL premix  Status:  Discontinued     1,000 mg 200 mL/hr over 60 Minutes Intravenous Every 24 hours 02/22/16 2357 02/23/16 0730  02/22/16 1345  cefTRIAXone (ROCEPHIN) 1 g in dextrose 5 % 50 mL IVPB     1 g 100 mL/hr over 30 Minutes Intravenous Every 24 hours 02/22/16 1332     02/22/16 1000  piperacillin-tazobactam (ZOSYN) IVPB 3.375 g  Status:  Discontinued     3.375 g 12.5 mL/hr over 240 Minutes Intravenous Every 8 hours 02/22/16 0954 02/22/16 1332   02/22/16 0030   piperacillin-tazobactam (ZOSYN) IVPB 3.375 g     3.375 g 100 mL/hr over 30 Minutes Intravenous  Once 02/22/16 0025 02/22/16 0214   02/22/16 0030  vancomycin (VANCOCIN) IVPB 1000 mg/200 mL premix     1,000 mg 200 mL/hr over 60 Minutes Intravenous  Once 02/22/16 0025 02/22/16 0318      Subjective:    Up in chair yesterday No nausea but food does not taste good  Objective:    Filed Vitals:   02/25/16 0900 02/25/16 1000 02/25/16 1100 02/25/16 1435  BP: 147/73 122/47 135/69 141/60  Pulse: 70 71 80 77  Temp:    98.3 F (36.8 C)  TempSrc:    Oral  Resp: _0 Height:      Weight:      SpO2: 93% 95% 97% 97%    Intake/Output Summary (Last 24 hours) at 02/25/16 1452 Last data filed at 02/25/16 0830  Gross per 24 hour  Intake    885 ml  Output      0 ml  Net    885 ml   Filed Weights   02/23/16 0400 02/24/16 0500 02/25/16 0500  Weight: 95.6 kg (210 lb 12.2 oz) 95.2 kg (209 lb 14.1 oz) 93.2 kg (205 lb 7.5 oz)    Exam: Gen:  NAD Cardiovascular:  RRR, No M/R/G Respiratory:  Lungs CTAB Gastrointestinal:  Abdomen soft, NT/ND, + BS Extremities:  min edema   Data Reviewed:    Labs: Basic Metabolic Panel:  Recent Labs Lab 02/22/16 0006 02/22/16 0356 02/23/16 0444 02/24/16 0435 02/25/16 0515  NA 137 140 136 136 136  K 4.4 4.2 4.6 4.2 4.1  CL 108 113* 107 106 104  CO2 20* 20* 21* 21* 23  GLUCOSE 127* 58* 229* 251* 258*  BUN 58* 52* 50* 46* 42*  CREATININE 2.87* 2.61* 2.60* 2.24* 2.26*  CALCIUM 8.8* 8.2* 8.3* 8.8* 9.0   GFR Estimated Creatinine Clearance: 28.8 mL/min (by C-G formula based on Cr of 2.26). Liver Function Tests:  Recent Labs Lab 02/22/16 0032 02/22/16 0356  AST 17 16  ALT 12* 11*  ALKPHOS 63 50  BILITOT 0.8 0.7  PROT 7.7 6.3*  ALBUMIN 3.9 3.3*   CBC:  Recent Labs Lab 02/22/16 0006 02/22/16 0356 02/23/16 0444 02/24/16 0435  WBC 12.7* 11.2* 13.7* 11.2*  NEUTROABS  --   --  11.6*  --   HGB 11.1* 10.1* 10.0* 11.0*  HCT 33.7*  30.6* 30.2* 33.2*  MCV 95.7 96.5 96.8 95.1  PLT 130* 124* 119* 133*   Cardiac Enzymes:  Recent Labs Lab 02/22/16 0006 02/22/16 0356 02/22/16 0933 02/22/16 1653 02/22/16 2220  TROPONINI 0.10* 0.94* 1.20* 1.34* 0.98*   CBG:  Recent Labs Lab 02/24/16 1118 02/24/16 1738 02/24/16 2238 02/25/16 0733 02/25/16 1130  GLUCAP 216* 303* 295* 218* 215*   Sepsis Labs:  Recent Labs Lab 02/22/16 0006 02/22/16 0356 02/23/16 0444 02/24/16 0435  WBC 12.7* 11.2* 13.7* 11.2*  LATICACIDVEN 1.6 1.1  --   --    Microbiology Recent Results (from the past 240 hour(s))  Blood Culture (routine x 2)     Status: None (Preliminary result)   Collection Time: 02/22/16 12:32 AM  Result Value Ref Range Status   Specimen Description BLOOD RIGHT ANTECUBITAL  Final   Special Requests   Final    BOTTLES DRAWN AEROBIC AND ANAEROBIC AEB 10CC ANA 4CC   Culture  Setup Time   Final    GRAM NEGATIVE RODS AEROBIC BOTTLE ONLY PREVIOUSLY REPORTED AS: GRAM POSITIVE COCCI CORRECTED RESULTS CALLED TO: M GREY 02/24/16 @ 65 M VESTAL    Culture   Final    ESCHERICHIA COLI Performed at [facility]    Report Status PENDING  Incomplete   Organism ID, Bacteria ESCHERICHIA COLI  Final      Susceptibility   Escherichia coli - MIC*    AMPICILLIN >=32 RESISTANT Resistant     CEFAZOLIN <=4 SENSITIVE Sensitive     CEFEPIME <=1 SENSITIVE Sensitive     CEFTAZIDIME <=1 SENSITIVE Sensitive     CEFTRIAXONE <=1 SENSITIVE Sensitive     CIPROFLOXACIN <=0.25 SENSITIVE Sensitive     GENTAMICIN <=1 SENSITIVE Sensitive     IMIPENEM <=0.25 SENSITIVE Sensitive     TRIMETH/SULFA <=20 SENSITIVE Sensitive     AMPICILLIN/SULBACTAM 16 INTERMEDIATE Intermediate     PIP/TAZO <=4 SENSITIVE Sensitive     * ESCHERICHIA COLI  Urine culture     Status: None   Collection Time: 02/22/16 12:38 AM  Result Value Ref Range Status   Specimen Description URINE, CLEAN CATCH  Final   Special Requests NONE  Final   Culture   Final      >=100,000 COLONIES/mL ESCHERICHIA COLI Performed at [facility]    Report Status 02/24/2016 FINAL  Final   Organism ID, Bacteria ESCHERICHIA COLI  Final      Susceptibility   Escherichia coli - MIC*    AMPICILLIN >=32 RESISTANT Resistant     CEFAZOLIN <=4 SENSITIVE Sensitive     CEFTRIAXONE <=1 SENSITIVE Sensitive     CIPROFLOXACIN <=0.25 SENSITIVE Sensitive     GENTAMICIN <=1 SENSITIVE Sensitive     IMIPENEM <=0.25 SENSITIVE Sensitive     NITROFURANTOIN <=16 SENSITIVE Sensitive     TRIMETH/SULFA <=20 SENSITIVE Sensitive     AMPICILLIN/SULBACTAM 16 INTERMEDIATE Intermediate     PIP/TAZO <=4 SENSITIVE Sensitive     * >=100,000 COLONIES/mL ESCHERICHIA COLI  Blood Culture (routine x 2)     Status: None (Preliminary result)   Collection Time: 02/22/16 12:43 AM  Result Value Ref Range Status   Specimen Description BLOOD LEFT HAND  Final   Special Requests BOTTLES DRAWN AEROBIC ONLY 10CC  Final   Culture NO GROWTH 3 DAYS  Final   Report Status PENDING  Incomplete  MRSA PCR Screening     Status: None   Collection Time: 02/22/16  3:39 AM  Result Value Ref Range Status   MRSA by PCR NEGATIVE NEGATIVE Final    Comment:        The GeneXpert MRSA Assay (FDA approved for NASAL specimens only), is one component of a comprehensive MRSA colonization surveillance program. It is not intended to diagnose MRSA infection nor to guide or monitor treatment for MRSA infections.   Culture, blood (Routine X 2) w Reflex to ID Panel     Status: None (Preliminary result)   Collection Time: 02/24/16  4:35 AM  Result Value Ref Range Status   Specimen Description BLOOD LEFT ARM  Final   Special Requests BOTTLES DRAWN AEROBIC AND ANAEROBIC  [place]  Final   Culture NO GROWTH 1 DAY  Final   Report Status PENDING  Incomplete  Culture, blood (Routine X 2) w Reflex to ID Panel     Status: None (Preliminary result)   Collection Time: 02/24/16  4:45 AM  Result Value Ref Range Status   Specimen  Description BLOOD LEFT ARM  Final   Special Requests BOTTLES DRAWN AEROBIC AND ANAEROBIC 6CC  Final   Culture NO GROWTH 1 DAY  Final   Report Status PENDING  Incomplete     Medications:   . albuterol  2.5 mg Nebulization BID  . aspirin EC  81 mg Oral Daily  . atorvastatin  40 mg Oral q1800  . cefTRIAXone (ROCEPHIN)  IV  1 g Intravenous Q24H  . enoxaparin (LOVENOX) injection  30 mg Subcutaneous Q24H  . furosemide  20 mg Intravenous Daily  . insulin aspart  0-15 Units Subcutaneous TID WC  . insulin aspart  0-5 Units Subcutaneous QHS  . insulin glargine  85 Units Subcutaneous QHS  . levothyroxine  50 mcg Oral QAC breakfast  . potassium chloride  20 mEq Oral Daily   Continuous Infusions:    Time spent: 25 minutes.     LOS: 3 days   [facility] [phone]  02/25/2016, 2:52 PM

## 2016-02-26 DIAGNOSIS — I2510 Atherosclerotic heart disease of native coronary artery without angina pectoris: Secondary | ICD-10-CM

## 2016-02-26 DIAGNOSIS — N183 Chronic kidney disease, stage 3 (moderate): Secondary | ICD-10-CM

## 2016-02-26 DIAGNOSIS — Z794 Long term (current) use of insulin: Secondary | ICD-10-CM

## 2016-02-26 DIAGNOSIS — I248 Other forms of acute ischemic heart disease: Secondary | ICD-10-CM

## 2016-02-26 DIAGNOSIS — E1122 Type 2 diabetes mellitus with diabetic chronic kidney disease: Secondary | ICD-10-CM

## 2016-02-26 DIAGNOSIS — I5033 Acute on chronic diastolic (congestive) heart failure: Secondary | ICD-10-CM

## 2016-02-26 LAB — CBC
HEMATOCRIT: 34.8 % — AB (ref 39.0–52.0)
Hemoglobin: 11.4 g/dL — ABNORMAL LOW (ref 13.0–17.0)
MCH: 31.2 pg (ref 26.0–34.0)
MCHC: 32.8 g/dL (ref 30.0–36.0)
MCV: 95.3 fL (ref 78.0–100.0)
Platelets: 140 10*3/uL — ABNORMAL LOW (ref 150–400)
RBC: 3.65 MIL/uL — AB (ref 4.22–5.81)
RDW: 12.3 % (ref 11.5–15.5)
WBC: 7.2 10*3/uL (ref 4.0–10.5)

## 2016-02-26 LAB — GLUCOSE, CAPILLARY
GLUCOSE-CAPILLARY: 136 mg/dL — AB (ref 65–99)
GLUCOSE-CAPILLARY: 206 mg/dL — AB (ref 65–99)
Glucose-Capillary: 259 mg/dL — ABNORMAL HIGH (ref 65–99)

## 2016-02-26 LAB — BASIC METABOLIC PANEL
ANION GAP: 10 (ref 5–15)
BUN: 44 mg/dL — ABNORMAL HIGH (ref 6–20)
CO2: 24 mmol/L (ref 22–32)
Calcium: 8.8 mg/dL — ABNORMAL LOW (ref 8.9–10.3)
Chloride: 104 mmol/L (ref 101–111)
Creatinine, Ser: 2.19 mg/dL — ABNORMAL HIGH (ref 0.61–1.24)
GFR, EST AFRICAN AMERICAN: 31 mL/min — AB (ref >60)
GFR, EST NON AFRICAN AMERICAN: 27 mL/min — AB (ref >60)
GLUCOSE: 190 mg/dL — AB (ref 65–99)
POTASSIUM: 3.7 mmol/L (ref 3.5–5.1)
Sodium: 138 mmol/L (ref 135–145)

## 2016-02-26 MED ORDER — ALPRAZOLAM 0.25 MG PO TABS
0.2500 mg | ORAL_TABLET | Freq: Three times a day (TID) | ORAL | Status: DC | PRN
  Administered 2016-02-26: 0.25 mg via ORAL
  Filled 2016-02-26: qty 1

## 2016-02-26 MED ORDER — CEFUROXIME AXETIL 250 MG PO TABS
500.0000 mg | ORAL_TABLET | Freq: Two times a day (BID) | ORAL | Status: DC
  Administered 2016-02-26: 500 mg via ORAL
  Filled 2016-02-26: qty 2

## 2016-02-26 MED ORDER — METOPROLOL SUCCINATE ER 25 MG PO TB24
12.5000 mg | ORAL_TABLET | Freq: Every day | ORAL | Status: DC
Start: 2016-02-26 — End: 2016-02-26
  Administered 2016-02-26: 12.5 mg via ORAL
  Filled 2016-02-26: qty 1

## 2016-02-26 MED ORDER — INSULIN GLARGINE 100 UNIT/ML ~~LOC~~ SOLN: 85.0000 [IU] | Freq: Every day | SUBCUTANEOUS | Status: DC

## 2016-02-26 MED ORDER — INSULIN ASPART 100 UNIT/ML ~~LOC~~ SOLN: 0.0000 [IU] | Freq: Three times a day (TID) | SUBCUTANEOUS | Status: DC

## 2016-02-26 MED ORDER — CEFUROXIME AXETIL 500 MG PO TABS: 500.0000 mg | ORAL_TABLET | Freq: Two times a day (BID) | ORAL | Status: DC

## 2016-02-26 MED ORDER — POTASSIUM CHLORIDE CRYS ER 20 MEQ PO TBCR: 20.0000 meq | EXTENDED_RELEASE_TABLET | Freq: Every day | ORAL | Status: DC

## 2016-02-26 MED ORDER — METOPROLOL SUCCINATE ER 25 MG PO TB24: 12.5000 mg | ORAL_TABLET | Freq: Every day | ORAL | Status: DC

## 2016-02-26 MED ORDER — FUROSEMIDE 40 MG PO TABS
40.0000 mg | ORAL_TABLET | Freq: Every day | ORAL | Status: DC
  Administered 2016-02-26: 40 mg via ORAL
  Filled 2016-02-26: qty 1

## 2016-02-26 MED ORDER — FUROSEMIDE 40 MG PO TABS: 40.0000 mg | ORAL_TABLET | Freq: Every day | ORAL | Status: DC

## 2016-02-26 NOTE — Progress Notes (Signed)
Primary Cardiologist: Dr. Kate Sable  Cardiology Specific Problem List: 1. Demand Ischemia 2. Acute Diastolic CHF 3. CAD with Hx of CABG   Subjective:    Feeling better. Wants to go home.   Objective:   Temp:  [98.1 F (36.7 C)-98.5 F (36.9 C)] 98.1 F (36.7 C) (03/14 0500) Pulse Rate:  [68-80] 68 (03/14 0500) Resp:  [8-20] 18 (03/14 0500) BP: (122-147)/(47-73) 130/64 mmHg (03/14 0500) SpO2:  [93 %-97 %] 95 % (03/14 0500) Weight:  [206 lb (93.441 kg)] 206 lb (93.441 kg) (03/14 0500) Last BM Date: 02/25/16  Filed Weights   02/24/16 0500 02/25/16 0500 02/26/16 0500  Weight: 209 lb 14.1 oz (95.2 kg) 205 lb 7.5 oz (93.2 kg) 206 lb (93.441 kg)    Intake/Output Summary (Last 24 hours) at 02/26/16 0859 Last data filed at 02/25/16 1905  Gross per 24 hour  Intake    360 ml  Output      0 ml  Net    360 ml    Telemetry: NSR with one episode of sinus tachycardia rate of 120 bpm at 8 pm last evening (he states he was getting up to go to the bathroom).  Exam:  Gen: Appears comfortable.  Lungs: Clear to auscultation, nonlabored.  Cardiac: No elevated JVP or bruits. RRR occasional extra systole, no gallop or rub.   Abdomen: Normoactive bowel sounds, nontender, nondistended.  Extremities: No pitting edema, distal pulses full.  Lab Results:  Basic Metabolic Panel:  Recent Labs Lab 02/24/16 0435 02/25/16 0515 02/26/16 0421  NA 136 136 138  K 4.2 4.1 3.7  CL 106 104 104  CO2 21* 23 24  GLUCOSE 251* 258* 190*  BUN 46* 42* 44*  CREATININE 2.24* 2.26* 2.19*  CALCIUM 8.8* 9.0 8.8*    Liver Function Tests:  Recent Labs Lab 02/22/16 0032 02/22/16 0356  AST 17 16  ALT 12* 11*  ALKPHOS 63 50  BILITOT 0.8 0.7  PROT 7.7 6.3*  ALBUMIN 3.9 3.3*    CBC:  Recent Labs Lab 02/23/16 0444 02/24/16 0435 02/26/16 0421  WBC 13.7* 11.2* 7.2  HGB 10.0* 11.0* 11.4*  HCT 30.2* 33.2* 34.8*  MCV 96.8 95.1 95.3  PLT 119* 133* 140*    Cardiac  Enzymes:  Recent Labs Lab 02/22/16 0933 02/22/16 1653 02/22/16 2220  TROPONINI 1.20* 1.34* 0.98*    Medications:   Scheduled Medications: . aspirin EC  81 mg Oral Daily  . atorvastatin  40 mg Oral q1800  . cefUROXime  500 mg Oral BID WC  . enoxaparin (LOVENOX) injection  30 mg Subcutaneous Q24H  . furosemide  40 mg Oral Daily  . insulin aspart  0-15 Units Subcutaneous TID WC  . insulin aspart  0-5 Units Subcutaneous QHS  . insulin glargine  85 Units Subcutaneous QHS  . levothyroxine  50 mcg Oral QAC breakfast  . potassium chloride  20 mEq Oral Daily    PRN Medications: acetaminophen **OR** acetaminophen, albuterol, ondansetron (ZOFRAN) IV   Assessment and Plan:   1. Demand ischemia: Likely related to rapid atrial fibrillation and CHF.  2. Acute Diastolic CHF: He has no further evidence of fluid overload on assessment. Will change IV lasix to po 40 mg daily as recommended by Dr. Harrington Challenger on yesterday's note. Creatinine 2.26. Potassium 4.1. Not on ACEi due to renal insufficiency.   3. CAD with Hx of CABG: Medical management with BB, metoprolol 12.5 mg BB, ASA, and statin.   Phill Myron. Purcell Nails NP  [facility]  02/26/2016, 8:59 AM    Attending note:  Patient seen and examined. Modified above noted by Ms. Lawrence NP. Mr. Hagge is clinically stable from a cardiac perspective. Agree with switch from IV to oral Lasix for management of diastolic heart failure. The elevation in troponin I most likely reflects demand ischemia in the setting of sepsis with UTI and initially hypotension. Echocardiogram during this hospital stay shows preserved LVEF at 60-65% with grade 1 diastolic dysfunction. Current cardiac regimen includes aspirin, Lipitor, Lasix at 40 mg oral daily, and KCl. No ACE inhibitor or ARB with CKD stage 3. Also on Lopressor as an outpatient, would start Toprol-XL 12.5 mg daily. No further cardiac testing is planned. We will arrange follow-up with Dr. Bronson Ing or APP in the next  few weeks.  Satira Sark, M.D., F.A.C.C.

## 2016-02-26 NOTE — Progress Notes (Signed)
Pt IV and telemetry removed, tolerated well. 

## 2016-02-26 NOTE — Discharge Summary (Addendum)
Physician Discharge Summary  [name] [id number] DOB: 04/10/1935 DOA: 02/21/2016  PCP: Deloria Lair, MD  Admit date: 02/21/2016 Discharge date: 02/26/2016  Time spent: 35 minutes  Recommendations for Outpatient Follow-up:  1. Continued diabetic management for optimal control 2. Cbc, bmp 1 week   Discharge Diagnoses:  Active Problems:   CAD, ARTERY BYPASS GRAFT   Atrial fibrillation (HCC)   Sepsis ([place])   Diabetes mellitus ([place])   Demand ischemia (HCC)   UTI (urinary tract infection)   Acute on chronic renal failure (HCC)   Pyrexia   Arterial hypotension   Hypoxia   UTI (lower urinary tract infection)   Bacteremia   Elevated troponin acute diastolic CHF (congestive heart failure) ([place])   Discharge Condition: improved  Diet recommendation: carb mod/cardiac  Filed Weights   02/24/16 0500 02/25/16 0500 02/26/16 0500  Weight: 95.2 kg (209 lb 14.1 oz) 93.2 kg (205 lb 7.5 oz) 93.441 kg (206 lb)    History of present illness:  81 year old male who  has a past medical history of Afib ([place]); Hypertension; Hyperlipidemia; Coronary artery disease; Diabetes mellitus; Renal insufficiency; and Renal insufficiency. Today presents to the hospital with chief complaint of fever, cough, sore throat which started this morning. Also patient had chest pain which lasted for a few minutes. Patient also complains of worsening shortness of breath. He also complains of dysuria for past few days. Denies nausea vomiting or diarrhea. No headache no abdominal pain. In the ED patient became hypotensive with systolic blood pressure dropped into 80s. Blood pressure has responded well to fluid challenge. Also has mild elevation of troponin 0.10. UA shows significant WBCs. Chest x-ray clear  Hospital Course:  sepsis with hypotension due to UTI- e-coli in urine and blood - Patient was provided with aggressive fluid volume resuscitation on admission. Sepsis criteria met.  - Source likely from  urinary source. Urine microscopy showed too numerous to count WBC and many bacteria - Chest x-ray negative for pneumonia. Influenza panel negative. - Narrow Zosyn to Rocephin- ecoli on culture and finish PO course  1 blood culture positive for gram neg rods-- corrected on 3/12 from gram + cocci -d/c vanc -repeat blood culture NGTD   Acute diastolic CHF - KVO IV fluids and give Lasix 20 mg IV BID- change to PO lasix and outpatient follow up with cards -appreciate cardiology   CAD, ARTERY BYPASS GRAFT with unstable anginaAnd elevated troponin  - Known history of severe double vessel disease. Continue aspirin & statin. - Cardiology following with plans for consideration of PCI versus repeat CABG.  - EKG without evidence of acute ST-T wave changes. Continue cycle cardiac enzymes.    Acute kidney injury/stage III chronic kidney disease  - Baseline creatinine approximately 1.8.  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  Procedures: Echo: Left ventricle: The cavity size was normal. Wall thickness was  increased in a pattern of moderate LVH. Systolic function was  normal. The estimated ejection fraction was in the range of 60%  to 65%. Images were inadequate for LV wall motion assessment.  Doppler parameters are consistent with abnormal left ventricular  relaxation (grade 1 diastolic dysfunction). - Aortic valve: Trileaflet; mildly thickened, mildly calcified  leaflets. There was no stenosis. - Mitral valve: Calcified annulus. - Left atrium: The atrium was mildly to moderately dilated.  Consultations:  cardiology  Discharge Exam: Filed Vitals:   02/26/16 0500  02/26/16 1233  BP: 130/64   Pulse: 68 88  Temp: 98.1 F (36.7 C)   Resp: 18     General: awake, NAD- anxious to go home   Discharge Instructions   Discharge Instructions    Diet - low sodium  heart healthy    Complete by:  As directed      Diet Carb Modified    Complete by:  As directed      Discharge instructions    Complete by:  As directed   Would use nutrition supplements until appetite better     Increase activity slowly    Complete by:  As directed           Current Discharge Medication List    START taking these medications   Details  cefUROXime (CEFTIN) 500 MG tablet Take 1 tablet (500 mg total) by mouth 2 (two) times daily with a meal. Qty: 12 tablet, Refills: 0    furosemide (LASIX) 40 MG tablet Take 1 tablet (40 mg total) by mouth daily. Qty: 30 tablet, Refills: 0    insulin aspart (NOVOLOG) 100 UNIT/ML injection Inject 0-15 Units into the skin 3 (three) times daily with meals. CBG 70 - 120: 0 units CBG 121 - 150: 2 units CBG 151 - 200: 3 units CBG 201 - 250: 5 units CBG 251 - 300: 8 units CBG 301 - 350: 11 units CBG 351 - 400: 15 units Qty: 10 mL, Refills: 11    metoprolol succinate (TOPROL-XL) 25 MG 24 hr tablet Take 0.5 tablets (12.5 mg total) by mouth daily. Qty: 30 tablet, Refills: 0    potassium chloride SA (K-DUR,KLOR-CON) 20 MEQ tablet Take 1 tablet (20 mEq total) by mouth daily. Qty: 30 tablet, Refills: 0      CONTINUE these medications which have CHANGED   Details  insulin glargine (LANTUS) 100 UNIT/ML injection Inject 0.85 mLs (85 Units total) into the skin at bedtime. Qty: 10 mL, Refills: 11      CONTINUE these medications which have NOT CHANGED   Details  aspirin 81 MG tablet Take 81 mg by mouth daily.    atorvastatin (LIPITOR) 40 MG tablet Take 1 tablet by mouth at bedtime. Refills: 3    levothyroxine (SYNTHROID, LEVOTHROID) 50 MCG tablet Take 50 mcg by mouth daily.    oxyCODONE-acetaminophen (PERCOCET) 10-325 MG tablet Take 1 tablet by mouth every 6 (six) hours as needed for pain.  Refills: 0      STOP taking these medications     amLODipine (NORVASC) 5 MG tablet      eszopiclone (LUNESTA) 2 MG TABS      insulin  NPH-insulin regular (NOVOLIN 70/30) (70-30) 100 UNIT/ML injection      lisinopril (PRINIVIL,ZESTRIL) 20 MG tablet      metoprolol tartrate (LOPRESSOR) 25 MG tablet      nitroGLYCERIN (NITROSTAT) 0.4 MG SL tablet      Omega-3 Fatty Acids (FISH OIL PO)      OVER THE COUNTER MEDICATION      torsemide (DEMADEX) 10 MG tablet      simvastatin (ZOCOR) 80 MG tablet        No Known Allergies Follow-up Information    Follow up with TAPPER,DAVID B, MD In 1 week.   Specialty:  Family Medicine   Contact information:   [place] [address] [phone]        The results of significant diagnostics from this hospitalization (including imaging, microbiology, ancillary and  laboratory) are listed below for reference.    Significant Diagnostic Studies: Dg Chest 2 View  02/22/2016  CLINICAL DATA:  Shortness of breath. Dry cough, sore throat and fever. EXAM: CHEST  2 VIEW COMPARISON:  06/12/2015 FINDINGS: Patient is post median sternotomy and CABG. Cardiomegaly and vascular congestion are unchanged from prior. Minimal linear atelectasis at both lung bases. No focal opacity, previous right upper lobe opacity has resolved. No pleural effusion or pneumothorax. IMPRESSION: Stable cardiomegaly and vascular congestion. Mild bibasilar atelectasis, no evidence of pneumonia. Electronically Signed   By: Jeb Levering M.D.   On: 02/22/2016 01:28    Microbiology: Recent Results (from the past 240 hour(s))  Blood Culture (routine x 2)     Status: None (Preliminary result)   Collection Time: 02/22/16 12:32 AM  Result Value Ref Range Status   Specimen Description BLOOD RIGHT ANTECUBITAL  Final   Special Requests   Final    BOTTLES DRAWN AEROBIC AND ANAEROBIC AEB 10CC ANA 4CC   Culture  Setup Time   Final    GRAM NEGATIVE RODS AEROBIC BOTTLE ONLY PREVIOUSLY REPORTED AS: GRAM POSITIVE COCCI CORRECTED RESULTS CALLED TO: M GREY 02/24/16 @ 20 M VESTAL    Culture   Final     ESCHERICHIA COLI Performed at [facility]    Report Status PENDING  Incomplete   Organism ID, Bacteria ESCHERICHIA COLI  Final      Susceptibility   Escherichia coli - MIC*    AMPICILLIN >=32 RESISTANT Resistant     CEFAZOLIN <=4 SENSITIVE Sensitive     CEFEPIME <=1 SENSITIVE Sensitive     CEFTAZIDIME <=1 SENSITIVE Sensitive     CEFTRIAXONE <=1 SENSITIVE Sensitive     CIPROFLOXACIN <=0.25 SENSITIVE Sensitive     GENTAMICIN <=1 SENSITIVE Sensitive     IMIPENEM <=0.25 SENSITIVE Sensitive     TRIMETH/SULFA <=20 SENSITIVE Sensitive     AMPICILLIN/SULBACTAM 16 INTERMEDIATE Intermediate     PIP/TAZO <=4 SENSITIVE Sensitive     * ESCHERICHIA COLI  Urine culture     Status: None   Collection Time: 02/22/16 12:38 AM  Result Value Ref Range Status   Specimen Description URINE, CLEAN CATCH  Final   Special Requests NONE  Final   Culture   Final    >=100,000 COLONIES/mL ESCHERICHIA COLI Performed at [facility]    Report Status 02/24/2016 FINAL  Final   Organism ID, Bacteria ESCHERICHIA COLI  Final      Susceptibility   Escherichia coli - MIC*    AMPICILLIN >=32 RESISTANT Resistant     CEFAZOLIN <=4 SENSITIVE Sensitive     CEFTRIAXONE <=1 SENSITIVE Sensitive     CIPROFLOXACIN <=0.25 SENSITIVE Sensitive     GENTAMICIN <=1 SENSITIVE Sensitive     IMIPENEM <=0.25 SENSITIVE Sensitive     NITROFURANTOIN <=16 SENSITIVE Sensitive     TRIMETH/SULFA <=20 SENSITIVE Sensitive     AMPICILLIN/SULBACTAM 16 INTERMEDIATE Intermediate     PIP/TAZO <=4 SENSITIVE Sensitive     * >=100,000 COLONIES/mL ESCHERICHIA COLI  Blood Culture (routine x 2)     Status: None (Preliminary result)   Collection Time: 02/22/16 12:43 AM  Result Value Ref Range Status   Specimen Description BLOOD LEFT HAND  Final   Special Requests BOTTLES DRAWN AEROBIC ONLY 10CC  Final   Culture NO GROWTH 4 DAYS  Final   Report Status PENDING  Incomplete  MRSA PCR Screening     Status: None   Collection Time:  02/22/16  3:39 AM  Result Value Ref Range Status   MRSA by PCR NEGATIVE NEGATIVE Final    Comment:        The GeneXpert MRSA Assay (FDA approved for NASAL specimens only), is one component of a comprehensive MRSA colonization surveillance program. It is not intended to diagnose MRSA infection nor to guide or monitor treatment for MRSA infections.   Culture, blood (Routine X 2) w Reflex to ID Panel     Status: None (Preliminary result)   Collection Time: 02/24/16  4:35 AM  Result Value Ref Range Status   Specimen Description BLOOD LEFT ARM  Final   Special Requests BOTTLES DRAWN AEROBIC AND ANAEROBIC 6CC  Final   Culture NO GROWTH 2 DAYS  Final   Report Status PENDING  Incomplete  Culture, blood (Routine X 2) w Reflex to ID Panel     Status: None (Preliminary result)   Collection Time: 02/24/16  4:45 AM  Result Value Ref Range Status   Specimen Description BLOOD LEFT ARM  Final   Special Requests BOTTLES DRAWN AEROBIC AND ANAEROBIC 6CC  Final   Culture NO GROWTH 2 DAYS  Final   Report Status PENDING  Incomplete     Labs: Basic Metabolic Panel:  Recent Labs Lab 02/22/16 0356 02/23/16 0444 02/24/16 0435 02/25/16 0515 02/26/16 0421  NA 140 136 136 136 138  K 4.2 4.6 4.2 4.1 3.7  CL 113* 107 106 104 104  CO2 20* 21* 21* 23 24  GLUCOSE 58* 229* 251* 258* 190*  BUN 52* 50* 46* 42* 44*  CREATININE 2.61* 2.60* 2.24* 2.26* 2.19*  CALCIUM 8.2* 8.3* 8.8* 9.0 8.8*   Liver Function Tests:  Recent Labs Lab 02/22/16 0032 02/22/16 0356  AST 17 16  ALT 12* 11*  ALKPHOS 63 50  BILITOT 0.8 0.7  PROT 7.7 6.3*  ALBUMIN 3.9 3.3*   No results for input(s): LIPASE, AMYLASE in the last 168 hours. No results for input(s): AMMONIA in the last 168 hours. CBC:  Recent Labs Lab 02/22/16 0006 02/22/16 0356 02/23/16 0444 02/24/16 0435 02/26/16 0421  WBC 12.7* 11.2* 13.7* 11.2* 7.2  NEUTROABS  --   --  11.6*  --   --   HGB 11.1* 10.1* 10.0* 11.0* 11.4*  HCT 33.7* 30.6* 30.2*  33.2* 34.8*  MCV 95.7 96.5 96.8 95.1 95.3  PLT 130* 124* 119* 133* 140*   Cardiac Enzymes:  Recent Labs Lab 02/22/16 0006 02/22/16 0356 02/22/16 0933 02/22/16 1653 02/22/16 2220  TROPONINI 0.10* 0.94* 1.20* 1.34* 0.98*   BNP: BNP (last 3 results)  Recent Labs  02/22/16 0032 02/22/16 1053  BNP 78.0 684.0*    ProBNP (last 3 results) No results for input(s): PROBNP in the last 8760 hours.  CBG:  Recent Labs Lab 02/25/16 1629 02/25/16 2107 02/26/16 0133 02/26/16 0711 02/26/16 1125  GLUCAP 193* 231* 259* 136* 206*       Signed:  [place] DO [facility] 02/26/2016, 12:37 PM

## 2016-02-26 NOTE — Care Management Important Message (Signed)
Important Message  Patient Details  Name: KAYLOR BORT MRN: [id number] Date of Birth: 1934/12/21   Medicare Important Message Given:  Yes    Alvie Heidelberg, RN 02/26/2016, 11:46 AM

## 2016-02-26 NOTE — Progress Notes (Signed)
Discharged instructions given on medications,and follow up visits, patient,and family verbalized understanding. Prescription sent with patient. No c/o pain or discomfort noted. Staff to accompany patient to awaiting vehicle.Marland Kitchen

## 2016-02-26 NOTE — Care Management Note (Signed)
Case Management Note  Patient Details  Name: [name] MRN: [id number] Date of Birth: 05/28/1935  Subjective/Objective:       To room to speak with patient for discharge planning. Patient was sleeping soundly.  Patient granddaughter Threasa Beards present in the room and she stated that patient will be living with her at discharge .  Patient has walker that he dose not use and also continues to drive. No PT recommendations. House is also handicapped accessible. No CM needs identified.   Action/Plan:  Home with family care.  Expected Discharge Date:                  Expected Discharge Plan:  Home/Self Care  In-House Referral:  NA  Discharge planning Services  CM Consult  Post Acute Care Choice:    Choice offered to:     DME Arranged:    DME Agency:     HH Arranged:    HH Agency:     Status of Service:  Completed, signed off  Medicare Important Message Given:  Yes Date Medicare IM Given:    Medicare IM give by:    Date Additional Medicare IM Given:    Additional Medicare Important Message give by:     If discussed at [place] of Stay Meetings, dates discussed:    Additional Comments:  Alvie Heidelberg, RN 02/26/2016, 12:05 PM

## 2016-02-27 LAB — CULTURE, BLOOD (ROUTINE X 2): Culture: NO GROWTH

## 2016-03-01 LAB — CULTURE, BLOOD (ROUTINE X 2)
CULTURE: NO GROWTH
Culture: NO GROWTH

## 2016-03-11 ENCOUNTER — Encounter: Payer: Self-pay | Admitting: Physician Assistant

## 2016-03-11 ENCOUNTER — Ambulatory Visit (INDEPENDENT_AMBULATORY_CARE_PROVIDER_SITE_OTHER): Payer: Medicare HMO | Admitting: Physician Assistant

## 2016-03-11 VITALS — BP 118/60 | HR 50 | Ht 66.0 in | Wt 209.0 lb

## 2016-03-11 DIAGNOSIS — R0989 Other specified symptoms and signs involving the circulatory and respiratory systems: Secondary | ICD-10-CM | POA: Diagnosis not present

## 2016-03-11 DIAGNOSIS — I25810 Atherosclerosis of coronary artery bypass graft(s) without angina pectoris: Secondary | ICD-10-CM

## 2016-03-11 DIAGNOSIS — I5032 Chronic diastolic (congestive) heart failure: Secondary | ICD-10-CM

## 2016-03-11 DIAGNOSIS — I248 Other forms of acute ischemic heart disease: Secondary | ICD-10-CM | POA: Diagnosis not present

## 2016-03-11 NOTE — Assessment & Plan Note (Signed)
Patient's heart failure is compensated today. He has not gained any weight on his scales. Discharge weight from the hospital was 205 he is 209 today. He has no edema or evidence of heart failure and exam. Continue daily Lasix.

## 2016-03-11 NOTE — Assessment & Plan Note (Signed)
-  Continue Lipitor °

## 2016-03-11 NOTE — Assessment & Plan Note (Signed)
Doing well without recent angina. He is very sedentary. Recommend increasing his daily activities and beginning an exercise program.

## 2016-03-11 NOTE — Patient Instructions (Signed)
Your physician recommends that you schedule a follow-up appointment in: 4 Months with Dr. Bronson Ing  Your physician recommends that you continue on your current medications as directed. Please refer to the Current Medication list given to you today.  Your physician has requested that you have a carotid duplex. This test is an ultrasound of the carotid arteries in your neck. It looks at blood flow through these arteries that supply the brain with blood. Allow one hour for this exam. There are no restrictions or special instructions.  If you need a refill on your cardiac medications before your next appointment, please call your pharmacy.  Thank you for choosing [facility]!

## 2016-03-11 NOTE — Progress Notes (Signed)
Cardiology Office Note   Date:  03/11/2016   ID:  [name], DOB January 23, 1935, MRN [id number]  PCP:  Deloria Lair, MD  Cardiologist:  Dr. Ardyth Man Dr.Hochrein)  Chief Complaint: Can't Sleep    History of Present Illness: [name] is a 81 y.o. male who presents for post hospital follow-up. Patient has history of CABG in 2010 and was formerly followed by Dr. Percival Spanish. He was recently admitted to the hospital with hypotension and urosepsis with troponin elevation felt to be demand ischemia. He also had diastolic heart failure and diuresed easily. Echocardiogram showed preserved LV function EF 60-65% with grade 1 DD. No further cardiac workup was recommended.  Patient comes in today feeling quite well. He says he weighs himself every morning and has not gained any weight. He denies any chest pain, palpitations, dyspnea, dyspnea on exertion, dizziness or presyncope. He lives a very sedentary life and sits in his recliner almost all day long. He says he does go to the mailbox to get his mail and was short of breath prior to admission. He says now he has no trouble walking to his mailbox. His main complaint is inability to sleep. He would like Xanax for this.    Past Medical History  Diagnosis Date  . Afib ([place])   . Hypertension   . Hyperlipidemia   . Coronary artery disease 2010    Status post coronary artery bypass grafting  . Diabetes mellitus   . Renal insufficiency     Past Surgical History  Procedure Laterality Date  . Coronary artery bypass graft  2010    LIMA to LAD, SVG to diagonal, SVG to circumflex, marginal, SVG to posterior descending branch.  . Carotid insuff       Current Outpatient Prescriptions  Medication Sig Dispense Refill  . amLODipine (NORVASC) 5 MG tablet Take 5 mg by mouth daily.    Marland Kitchen aspirin 81 MG tablet Take 81 mg by mouth daily.    Marland Kitchen atorvastatin (LIPITOR) 40 MG tablet Take 1 tablet by mouth at bedtime.  3  . cefUROXime (CEFTIN)  500 MG tablet Take 1 tablet (500 mg total) by mouth 2 (two) times daily with a meal. 12 tablet 0  . furosemide (LASIX) 40 MG tablet Take 1 tablet (40 mg total) by mouth daily. 30 tablet 0  . insulin aspart (NOVOLOG) 100 UNIT/ML injection Inject 0-15 Units into the skin 3 (three) times daily with meals. CBG 70 - 120: 0 units CBG 121 - 150: 2 units CBG 151 - 200: 3 units CBG 201 - 250: 5 units CBG 251 - 300: 8 units CBG 301 - 350: 11 units CBG 351 - 400: 15 units 10 mL 11  . insulin glargine (LANTUS) 100 UNIT/ML injection Inject 0.85 mLs (85 Units total) into the skin at bedtime. 10 mL 11  . levothyroxine (SYNTHROID, LEVOTHROID) 50 MCG tablet Take 50 mcg by mouth daily.    Marland Kitchen lisinopril (PRINIVIL,ZESTRIL) 20 MG tablet TAKE ONE TABLET BY MOUTH ONCE DAILY FOR BLOOD PRESSURE  1  . metoprolol succinate (TOPROL-XL) 25 MG 24 hr tablet Take 0.5 tablets (12.5 mg total) by mouth daily. 30 tablet 0  . oxyCODONE-acetaminophen (PERCOCET) 10-325 MG tablet Take 1 tablet by mouth every 6 (six) hours as needed for pain.   0  . potassium chloride SA (K-DUR,KLOR-CON) 20 MEQ tablet Take 1 tablet (20 mEq total) by mouth daily. 30 tablet 0   No current facility-administered medications for this visit.  Allergies:   Review of patient's allergies indicates no known allergies.    Social History:  The patient  reports that he has never smoked. He does not have any smokeless tobacco history on file. He reports that he drinks alcohol.   Family History:  The patient's    family history includes Heart attack in his brother and father.    ROS:  Please see the history of present illness.   Otherwise, review of systems are positive for none.   All other systems are reviewed and negative.    PHYSICAL EXAM: VS:  BP 118/60 mmHg  Pulse 50  Ht 5\' 6"  (1.676 m)  Wt 209 lb (94.802 kg)  BMI 33.75 kg/m2  SpO2 95% , BMI Body mass index is 33.75 kg/(m^2). GEN: Well nourished, well developed, in no acute distress Neck: Loud  bilateral carotid bruits no JVD, HJR,  or masses Cardiac: RRR; positive S4, 2/6 systolic murmur at the left sternal border, no rubs, thrill or heave,  Respiratory:  clear to auscultation bilaterally, normal work of breathing GI: soft, nontender, nondistended, + BS MS: no deformity or atrophy Extremities: without cyanosis, clubbing, edema, good distal pulses bilaterally.  Skin: warm and dry, no rash Neuro:  Strength and sensation are intact    EKG:  EKG is not ordered today.    Recent Labs: 02/22/2016: ALT 11*; B Natriuretic Peptide 684.0* 02/26/2016: BUN 44*; Creatinine, Ser 2.19*; Hemoglobin 11.4*; Platelets 140*; Potassium 3.7; Sodium 138    Lipid Panel    Component Value Date/Time   CHOL  08/20/2009 0655    133        ATP III CLASSIFICATION:  <200     mg/dL   Desirable  200-239  mg/dL   Borderline High  >=240    mg/dL   High          TRIG 133 08/20/2009 0655   HDL 38* 08/20/2009 0655   CHOLHDL 3.5 08/20/2009 0655   VLDL 27 08/20/2009 0655   LDLCALC  08/20/2009 0655    68        Total Cholesterol/HDL:CHD Risk Coronary Heart Disease Risk Table                     Men   Women  1/2 Average Risk   3.4   3.3  Average Risk       5.0   4.4  2 X Average Risk   9.6   7.1  3 X Average Risk  23.4   11.0        Use the calculated Patient Ratio above and the CHD Risk Table to determine the patient's CHD Risk.        ATP III CLASSIFICATION (LDL):  <100     mg/dL   Optimal  100-129  mg/dL   Near or Above                    Optimal  130-159  mg/dL   Borderline  160-189  mg/dL   High  >190     mg/dL   Very High      Wt Readings from Last 3 Encounters:  03/11/16 209 lb (94.802 kg)  02/26/16 206 lb (93.441 kg)  09/25/09 194 lb (87.998 kg)      Other studies Reviewed: Additional studies/ records that were reviewed today include and review of the records demonstrates:   Echocardiogram 02/22/2016 Left ventricle: The cavity size was normal. Wall thickness was  increased  in a pattern of moderate LVH. Systolic function was   normal. The estimated ejection fraction was in the range of 60%   to 65%. Images were inadequate for LV wall motion assessment.   Doppler parameters are consistent with abnormal left ventricular   relaxation (grade 1 diastolic dysfunction). - Aortic valve: Trileaflet; mildly thickened, mildly calcified   leaflets. There was no stenosis. - Mitral valve: Calcified annulus. - Left atrium: The atrium was mildly to moderately dilated.     ASSESSMENT AND PLAN:  Chronic diastolic CHF (congestive heart failure) (HCC) Patient's heart failure is compensated today. He has not gained any weight on his scales. Discharge weight from the hospital was 205 he is 209 today. He has no edema or evidence of heart failure and exam. Continue daily Lasix.  Demand ischemia [facility]) Patient had elevated troponins in the hospital in the setting of urosepsis and hypotension. He has no chest pain since his bypass surgery in 2010.  CAD, ARTERY BYPASS GRAFT Doing well without recent angina. He is very sedentary. Recommend increasing his daily activities and beginning an exercise program.  Bilateral carotid bruits Patient has had right carotid endarterectomy in the past. He has loud bruits. Will check Dopplers.  HYPERLIPIDEMIA-MIXED Continue Lipitor.    Sumner Boast, PA-C  03/11/2016 11:45 AM    [facility] [address], [place], [place]  [address] Phone: [phone]; Fax: [phone]

## 2016-03-11 NOTE — Assessment & Plan Note (Signed)
Patient had elevated troponins in the hospital in the setting of urosepsis and hypotension. He has no chest pain since his bypass surgery in 2010.

## 2016-03-11 NOTE — Assessment & Plan Note (Signed)
Patient has had right carotid endarterectomy in the past. He has loud bruits. Will check Dopplers.

## 2016-03-14 ENCOUNTER — Ambulatory Visit (HOSPITAL_COMMUNITY): Admission: RE | Admit: 2016-03-14 | Payer: Medicare HMO | Source: Ambulatory Visit

## 2016-03-19 ENCOUNTER — Ambulatory Visit (HOSPITAL_COMMUNITY)
Admission: RE | Admit: 2016-03-19 | Discharge: 2016-03-19 | Disposition: A | Discharge status: Home / Self Care | Payer: Medicare HMO | Source: Ambulatory Visit | Attending: Physician Assistant | Admitting: Physician Assistant

## 2016-03-19 DIAGNOSIS — I6523 Occlusion and stenosis of bilateral carotid arteries: Secondary | ICD-10-CM | POA: Insufficient documentation

## 2016-03-19 DIAGNOSIS — R0989 Other specified symptoms and signs involving the circulatory and respiratory systems: Secondary | ICD-10-CM | POA: Diagnosis present

## 2016-03-26 LAB — HEMOGLOBIN A1C: HEMOGLOBIN A1C: 9.6

## 2016-04-01 ENCOUNTER — Telehealth: Payer: Self-pay

## 2016-04-01 NOTE — Telephone Encounter (Signed)
Fast busy signal signal. Will send letter.

## 2016-04-01 NOTE — Telephone Encounter (Signed)
-----   Message from Laurine Blazer, LPN sent at [id number] 10:51 AM EDT -----   ----- Message -----    From: Herminio Commons, MD    Sent: 03/31/2016  10:23 AM      To: Laurine Blazer, LPN  Moderate bilateral plaque disease. Repeat in 1 year.

## 2016-04-02 ENCOUNTER — Encounter: Payer: Self-pay | Admitting: *Deleted

## 2016-05-07 ENCOUNTER — Ambulatory Visit (INDEPENDENT_AMBULATORY_CARE_PROVIDER_SITE_OTHER): Payer: Medicare HMO | Admitting: "Endocrinology

## 2016-05-07 ENCOUNTER — Encounter: Payer: Self-pay | Admitting: "Endocrinology

## 2016-05-07 VITALS — BP 119/69 | HR 53 | Ht 66.0 in | Wt 214.0 lb

## 2016-05-07 DIAGNOSIS — N184 Chronic kidney disease, stage 4 (severe): Secondary | ICD-10-CM | POA: Diagnosis not present

## 2016-05-07 DIAGNOSIS — E038 Other specified hypothyroidism: Secondary | ICD-10-CM | POA: Diagnosis not present

## 2016-05-07 DIAGNOSIS — E1159 Type 2 diabetes mellitus with other circulatory complications: Secondary | ICD-10-CM

## 2016-05-07 DIAGNOSIS — E1165 Type 2 diabetes mellitus with hyperglycemia: Secondary | ICD-10-CM | POA: Diagnosis not present

## 2016-05-07 DIAGNOSIS — I10 Essential (primary) hypertension: Secondary | ICD-10-CM

## 2016-05-07 DIAGNOSIS — E785 Hyperlipidemia, unspecified: Secondary | ICD-10-CM | POA: Diagnosis not present

## 2016-05-07 DIAGNOSIS — Z794 Long term (current) use of insulin: Secondary | ICD-10-CM | POA: Diagnosis not present

## 2016-05-07 DIAGNOSIS — E1122 Type 2 diabetes mellitus with diabetic chronic kidney disease: Secondary | ICD-10-CM | POA: Diagnosis not present

## 2016-05-07 DIAGNOSIS — IMO0002 Reserved for concepts with insufficient information to code with codable children: Secondary | ICD-10-CM

## 2016-05-07 NOTE — Progress Notes (Signed)
Subjective:    Patient ID: [name], male    DOB: [birth year]. Patient is being seen in consultation for management of diabetes requested by  TAPPER,DAVID B, MD  Past Medical History  Diagnosis Date  . Afib ([place])   . Hypertension   . Hyperlipidemia   . Coronary artery disease 2010    Status post coronary artery bypass grafting  . Diabetes mellitus   . Renal insufficiency    Past Surgical History  Procedure Laterality Date  . Coronary artery bypass graft  2010    LIMA to LAD, SVG to diagonal, SVG to circumflex, marginal, SVG to posterior descending branch.  . Carotid insuff     Social History   Social History  . Marital Status: Married    Spouse Name: N/A  . Number of Children: N/A  . Years of Education: N/A   Social History Main Topics  . Smoking status: Never Smoker   . Smokeless tobacco: None  . Alcohol Use: 0.0 oz/week    0 Standard drinks or equivalent per week  . Drug Use: None  . Sexual Activity: Not Asked   Other Topics Concern  . None   Social History Narrative   Outpatient Encounter Prescriptions as of 05/07/2016  Medication Sig  . furosemide (LASIX) 40 MG tablet Take 1 tablet (40 mg total) by mouth daily.  . Insulin Aspart (NOVOLOG FLEXPEN [place]) Inject 10-16 Units into the skin 3 (three) times daily with meals.  . Insulin Glargine (LANTUS SOLOSTAR [place]) Inject 60 Units into the skin at bedtime.  Marland Kitchen levothyroxine (SYNTHROID, LEVOTHROID) 50 MCG tablet Take 50 mcg by mouth daily.  Marland Kitchen lisinopril (PRINIVIL,ZESTRIL) 20 MG tablet TAKE ONE TABLET BY MOUTH ONCE DAILY FOR BLOOD PRESSURE  . metoprolol succinate (TOPROL-XL) 25 MG 24 hr tablet Take 0.5 tablets (12.5 mg total) by mouth daily.  . potassium chloride SA (K-DUR,KLOR-CON) 20 MEQ tablet Take 1 tablet (20 mEq total) by mouth daily.  . [DISCONTINUED] insulin aspart (NOVOLOG) 100 UNIT/ML injection Inject 0-15 Units into the skin 3 (three) times daily with meals. CBG 70 - 120: 0 units CBG 121 - 150: 2  units CBG 151 - 200: 3 units CBG 201 - 250: 5 units CBG 251 - 300: 8 units CBG 301 - 350: 11 units CBG 351 - 400: 15 units  . [DISCONTINUED] insulin glargine (LANTUS) 100 UNIT/ML injection Inject 0.85 mLs (85 Units total) into the skin at bedtime.  Marland Kitchen amLODipine (NORVASC) 5 MG tablet Take 5 mg by mouth daily.  Marland Kitchen aspirin 81 MG tablet Take 81 mg by mouth daily.  Marland Kitchen atorvastatin (LIPITOR) 40 MG tablet Take 1 tablet by mouth at bedtime.  Marland Kitchen oxyCODONE-acetaminophen (PERCOCET) 10-325 MG tablet Take 1 tablet by mouth every 6 (six) hours as needed for pain.   . [DISCONTINUED] cefUROXime (CEFTIN) 500 MG tablet Take 1 tablet (500 mg total) by mouth 2 (two) times daily with a meal.   No facility-administered encounter medications on file as of 05/07/2016.   ALLERGIES: No Known Allergies VACCINATION STATUS:  There is no immunization history on file for this patient.  Diabetes He presents for his initial diabetic visit. He has type 2 diabetes mellitus. Onset time: He was diagnosed at approximate age of 28 years. His disease course has been worsening. There are no hypoglycemic associated symptoms. Pertinent negatives for hypoglycemia include no confusion, headaches, pallor or seizures. Associated symptoms include blurred vision, polydipsia and polyuria. Pertinent negatives for diabetes include no chest pain, no  fatigue, no polyphagia and no weakness. There are no hypoglycemic complications. Symptoms are worsening. Diabetic complications include heart disease, nephropathy, peripheral neuropathy and PVD. Risk factors for coronary artery disease include diabetes mellitus, dyslipidemia, hypertension, male sex, obesity and sedentary lifestyle. Current diabetic treatment includes insulin injections. His weight is increasing steadily. He is following a generally unhealthy diet. When asked about meal planning, he reported none. He has not had a previous visit with a dietitian. He never participates in exercise. Home  blood sugar record trend: He came with no meter nor log to review today. An ACE inhibitor/angiotensin II receptor blocker is being taken.  Hyperlipidemia This is a chronic problem. The current episode started more than 1 year ago. Pertinent negatives include no chest pain, myalgias or shortness of breath. Current antihyperlipidemic treatment includes statins. Risk factors for coronary artery disease include dyslipidemia, diabetes mellitus, hypertension, male sex, obesity and a sedentary lifestyle.  Hypertension This is a chronic problem. The current episode started more than 1 year ago. Associated symptoms include blurred vision. Pertinent negatives include no chest pain, headaches, neck pain, palpitations or shortness of breath. Risk factors for coronary artery disease include dyslipidemia, diabetes mellitus, male gender, obesity and sedentary lifestyle. Past treatments include ACE inhibitors. Hypertensive end-organ damage includes PVD.       Review of Systems  Constitutional: Negative for fever, chills, fatigue and unexpected weight change.  HENT: Negative for dental problem, mouth sores and trouble swallowing.   Eyes: Positive for blurred vision. Negative for visual disturbance.  Respiratory: Negative for cough, choking, chest tightness, shortness of breath and wheezing.   Cardiovascular: Negative for chest pain, palpitations and leg swelling.  Gastrointestinal: Negative for nausea, vomiting, abdominal pain, diarrhea, constipation and abdominal distention.  Endocrine: Positive for polydipsia and polyuria. Negative for polyphagia.  Genitourinary: Negative for dysuria, urgency, hematuria and flank pain.  Musculoskeletal: Negative for myalgias, back pain, gait problem and neck pain.  Skin: Negative for pallor, rash and wound.  Neurological: Negative for seizures, syncope, weakness, numbness and headaches.  Psychiatric/Behavioral: Negative.  Negative for confusion and dysphoric mood.     Objective:    BP 119/69 mmHg  Pulse 53  Ht 5\' 6"  (1.676 m)  Wt 214 lb (97.07 kg)  BMI 34.56 kg/m2  SpO2 93%  Wt Readings from Last 3 Encounters:  05/07/16 214 lb (97.07 kg)  03/11/16 209 lb (94.802 kg)  02/26/16 206 lb (93.441 kg)    Physical Exam  Constitutional: He is oriented to person, place, and time. He appears well-developed and well-nourished. He is cooperative. No distress.  HENT:  Head: Normocephalic and atraumatic.  Eyes: EOM are normal.  Neck: Normal range of motion. Neck supple. No tracheal deviation present. No thyromegaly present.  Cardiovascular: Normal rate, S1 normal, S2 normal and normal heart sounds.  Exam reveals no gallop.   No murmur heard. Pulses:      Dorsalis pedis pulses are 1+ on the right side, and 1+ on the left side.       Posterior tibial pulses are 1+ on the right side, and 1+ on the left side.  Pulmonary/Chest: Breath sounds normal. No respiratory distress. He has no wheezes.  Abdominal: Soft. Bowel sounds are normal. He exhibits no distension. There is no tenderness. There is no guarding and no CVA tenderness.  Musculoskeletal: He exhibits no edema.       Right shoulder: He exhibits no swelling and no deformity.  Neurological: He is alert and oriented to person, place, and time. He  has normal strength and normal reflexes. No cranial nerve deficit or sensory deficit. Gait normal.  Skin: Skin is warm and dry. No rash noted. No cyanosis. Nails show no clubbing.  Psychiatric: He has a normal mood and affect. His speech is normal and behavior is normal. Judgment and thought content normal. Cognition and memory are normal.     CMP     Component Value Date/Time   NA 138 02/26/2016 0421   K 3.7 02/26/2016 0421   CL 104 02/26/2016 0421   CO2 24 02/26/2016 0421   GLUCOSE 190* 02/26/2016 0421   BUN 44* 02/26/2016 0421   CREATININE 2.19* 02/26/2016 0421   CALCIUM 8.8* 02/26/2016 0421   PROT 6.3* 02/22/2016 0356   ALBUMIN 3.3* 02/22/2016 0356    AST 16 02/22/2016 0356   ALT 11* 02/22/2016 0356   ALKPHOS 50 02/22/2016 0356   BILITOT 0.7 02/22/2016 0356   GFRNONAA 27* 02/26/2016 0421   GFRAA 31* 02/26/2016 0421    Diabetic Labs (most recent): Lab Results  Component Value Date   HGBA1C 9.6 03/26/2016   HGBA1C 7.1* 02/23/2016   HGBA1C 7.4* 02/22/2016     Lipid Panel ( most recent) Lipid Panel     Component Value Date/Time   CHOL  08/20/2009 0655    133        ATP III CLASSIFICATION:  <200     mg/dL   Desirable  200-239  mg/dL   Borderline High  >=240    mg/dL   High          TRIG 133 08/20/2009 0655   HDL 38* 08/20/2009 0655   CHOLHDL 3.5 08/20/2009 0655   VLDL 27 08/20/2009 0655   LDLCALC  08/20/2009 0655    68        Total Cholesterol/HDL:CHD Risk Coronary Heart Disease Risk Table                     Men   Women  1/2 Average Risk   3.4   3.3  Average Risk       5.0   4.4  2 X Average Risk   9.6   7.1  3 X Average Risk  23.4   11.0        Use the calculated Patient Ratio above and the CHD Risk Table to determine the patient's CHD Risk.        ATP III CLASSIFICATION (LDL):  <100     mg/dL   Optimal  100-129  mg/dL   Near or Above                    Optimal  130-159  mg/dL   Borderline  160-189  mg/dL   High  >190     mg/dL   Very High     Assessment & Plan:   1. Uncontrolled type 2 diabetes mellitus with stage 4 chronic kidney disease, with long-term current use of insulin (HCC) And coronary artery disease which required coronary artery bypass graft in 2010   Patient has currently uncontrolled symptomatic type 2 DM since  81 years of age,  with most recent A1c of 9.6 %. Recent labs reviewed.   His diabetes is complicated by chronic kidney disease , coronary artery disease, peripheral arterial disease, and patient remains at a high risk for more acute and chronic complications of diabetes which include CAD, CVA, CKD, retinopathy, and neuropathy. These are all discussed in detail with the  patient.  -  I have counseled the patient on diet management and weight loss, by adopting a carbohydrate restricted/protein rich diet.  - Suggestion is made for patient to avoid simple carbohydrates   from their diet including Cakes , Desserts, Ice Cream,  Soda (  diet and regular) , Sweet Tea , Candies,  Chips, Cookies, Artificial Sweeteners,   and "Sugar-free" Products . This will help patient to have stable blood glucose profile and potentially avoid unintended weight gain.  - I encouraged the patient to switch to  unprocessed or minimally processed complex starch and increased protein intake (animal or plant source), fruits, and vegetables.  - Patient is advised to stick to a routine mealtimes to eat 3 meals  a day and avoid unnecessary snacks ( to snack only to correct hypoglycemia).  - The patient will be scheduled with Jearld Fenton, RDN, CDE for individualized DM education.  - I have approached patient with the following individualized plan to manage diabetes and patient agrees:   - He came with no meter nor log, he is at risk of hypoglycemia, he lives alone cooking for himself. -I will lower his insulin significantly. - I  will proceed to readjust his basal insulin Lantus to 60 units daily at bedtime, readjust his bolus insulin NovoLog to 10  units TIDAC for pre-meal BG readings of 90-150mg /dl, plus patient specific correction dose for unexpected hyperglycemia above 150mg /dl, associated with strict monitoring of glucose  AC and HS. - Patient is warned not to take insulin without proper monitoring per orders. -Adjustment parameters are given for hypo and hyperglycemia in writing. -Patient is encouraged to call clinic for blood glucose levels less than 70 or above 300 mg /dl.  -Patient is not a candidate for metformin, SGLT 2  inhibitors due to CKD.  - Patient will be considered for incretin therapy as appropriate next visit. - Patient specific target  A1c;  LDL, HDL, Triglycerides, and   Waist Circumference were discussed in detail.  2) BP/HTN: Controlled. Continue current medications including ACEI/ARB. 3) Lipids/HPL:  Controll unknown, continue statins. 4)  Weight/Diet: CDE Consult will be initiated , exercise, and detailed carbohydrates information provided.  5. Other specified hypothyroidism -Continue levothyroxine 50 g by mouth every morning.  - We discussed about correct intake of levothyroxine, at fasting, with water, separated by at least 30 minutes from breakfast, and separated by more than 4 hours from calcium, iron, multivitamins, acid reflux medications (PPIs). -Patient is made aware of the fact that thyroid hormone replacement is needed for life, dose to be adjusted by periodic monitoring of thyroid function tests.  6) Chronic Care/Health Maintenance:  -Patient is on ACEI/ARB and Statin medications and encouraged to continue to follow up with Ophthalmology, Podiatrist at least yearly or according to recommendations, and advised to   stay away from smoking. I have recommended yearly flu vaccine and pneumonia vaccination at least every 5 years; moderate intensity exercise for up to 150 minutes weekly; and  sleep for at least 7 hours a day.  - 60 minutes of time was spent on the care of this patient , 50% of which was applied for counseling on diabetes complications and their preventions.  - Patient to bring meter and  blood glucose logs during their next visit.   - I advised patient to maintain close follow up with TAPPER,DAVID B, MD for primary care needs.  Follow up plan: - Return in about 1 week (around 05/14/2016) for diabetes, high blood pressure, high cholesterol, underactive thyroid, follow up  with meter and logs- no labs.  Glade Lloyd, MD Phone: [phone]  Fax: [phone]   05/07/2016, 2:42 PM

## 2016-05-07 NOTE — Patient Instructions (Signed)

## 2016-05-15 ENCOUNTER — Ambulatory Visit: Payer: Medicare HMO | Admitting: Nutrition

## 2016-05-15 ENCOUNTER — Ambulatory Visit: Payer: Medicare HMO | Admitting: "Endocrinology

## 2016-05-22 ENCOUNTER — Other Ambulatory Visit (HOSPITAL_COMMUNITY): Payer: Self-pay | Admitting: Medical

## 2016-05-22 DIAGNOSIS — N183 Chronic kidney disease, stage 3 unspecified: Secondary | ICD-10-CM

## 2016-05-23 ENCOUNTER — Ambulatory Visit: Payer: Medicare HMO | Admitting: "Endocrinology

## 2016-06-06 ENCOUNTER — Ambulatory Visit: Payer: Medicare HMO | Admitting: Nutrition

## 2016-06-11 ENCOUNTER — Ambulatory Visit (HOSPITAL_COMMUNITY): Admission: RE | Admit: 2016-06-11 | Payer: Medicare HMO | Source: Ambulatory Visit

## 2016-08-01 ENCOUNTER — Emergency Department (HOSPITAL_COMMUNITY)
Admission: EM | Admit: 2016-08-01 | Discharge: 2016-08-01 | Disposition: A | Discharge status: Home / Self Care | Payer: No Typology Code available for payment source | Attending: Emergency Medicine | Admitting: Emergency Medicine

## 2016-08-01 ENCOUNTER — Emergency Department (HOSPITAL_COMMUNITY): Payer: No Typology Code available for payment source

## 2016-08-01 ENCOUNTER — Encounter (HOSPITAL_COMMUNITY): Payer: Self-pay | Admitting: Emergency Medicine

## 2016-08-01 DIAGNOSIS — Z79899 Other long term (current) drug therapy: Secondary | ICD-10-CM | POA: Insufficient documentation

## 2016-08-01 DIAGNOSIS — Y999 Unspecified external cause status: Secondary | ICD-10-CM | POA: Diagnosis not present

## 2016-08-01 DIAGNOSIS — I2510 Atherosclerotic heart disease of native coronary artery without angina pectoris: Secondary | ICD-10-CM | POA: Insufficient documentation

## 2016-08-01 DIAGNOSIS — Z7982 Long term (current) use of aspirin: Secondary | ICD-10-CM | POA: Diagnosis not present

## 2016-08-01 DIAGNOSIS — Y92410 Unspecified street and highway as the place of occurrence of the external cause: Secondary | ICD-10-CM | POA: Insufficient documentation

## 2016-08-01 DIAGNOSIS — I10 Essential (primary) hypertension: Secondary | ICD-10-CM | POA: Diagnosis not present

## 2016-08-01 DIAGNOSIS — Y939 Activity, unspecified: Secondary | ICD-10-CM | POA: Diagnosis not present

## 2016-08-01 DIAGNOSIS — S199XXA Unspecified injury of neck, initial encounter: Secondary | ICD-10-CM | POA: Diagnosis present

## 2016-08-01 DIAGNOSIS — S161XXA Strain of muscle, fascia and tendon at neck level, initial encounter: Secondary | ICD-10-CM | POA: Diagnosis not present

## 2016-08-01 DIAGNOSIS — Z794 Long term (current) use of insulin: Secondary | ICD-10-CM | POA: Insufficient documentation

## 2016-08-01 DIAGNOSIS — E119 Type 2 diabetes mellitus without complications: Secondary | ICD-10-CM | POA: Insufficient documentation
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
## 2016-08-01 NOTE — Discharge Instructions (Signed)
If you were given medicines take as directed.  If you are on coumadin or contraceptives realize their levels and effectiveness is altered by many different medicines.  If you have any reaction (rash, tongues swelling, other) to the medicines stop taking and see a physician.    If your blood pressure was elevated in the ER make sure you follow up for management with a primary doctor or return for chest pain, shortness of breath or stroke symptoms.  Please follow up as directed and return to the ER or see a physician for new or worsening symptoms.  Thank you. Vitals:   08/01/16 1453 08/01/16 1454  BP: 188/77   Pulse: 83   Resp: 18   Temp: 98.9 F (37.2 C)   TempSrc: Temporal   SpO2: 95%   Weight:  200 lb (90.7 kg)  Height:  5\' 5"  (1.651 m)
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
## 2016-08-27 ENCOUNTER — Other Ambulatory Visit (HOSPITAL_COMMUNITY): Payer: Self-pay | Admitting: Nephrology

## 2016-08-27 DIAGNOSIS — N183 Chronic kidney disease, stage 3 unspecified: Secondary | ICD-10-CM

## 2016-09-01 ENCOUNTER — Ambulatory Visit (HOSPITAL_COMMUNITY): Admission: RE | Admit: 2016-09-01 | Payer: Medicare HMO | Source: Ambulatory Visit

## 2017-01-07 ENCOUNTER — Ambulatory Visit (INDEPENDENT_AMBULATORY_CARE_PROVIDER_SITE_OTHER): Payer: Medicare HMO | Admitting: Podiatry

## 2017-01-07 ENCOUNTER — Encounter: Payer: Self-pay | Admitting: Podiatry

## 2017-01-07 VITALS — BP 127/56 | HR 52 | Resp 18

## 2017-01-07 DIAGNOSIS — E0841 Diabetes mellitus due to underlying condition with diabetic mononeuropathy: Secondary | ICD-10-CM | POA: Diagnosis not present

## 2017-01-07 DIAGNOSIS — R0989 Other specified symptoms and signs involving the circulatory and respiratory systems: Secondary | ICD-10-CM | POA: Diagnosis not present

## 2017-01-07 DIAGNOSIS — I739 Peripheral vascular disease, unspecified: Secondary | ICD-10-CM

## 2017-01-07 DIAGNOSIS — B351 Tinea unguium: Secondary | ICD-10-CM | POA: Diagnosis not present

## 2017-01-07 NOTE — Progress Notes (Signed)
   Subjective:    Patient ID: [name], male    DOB: 09-04-1935, 82 y.o.   MRN: [id number]  HPI     This diabetic patient presents today with his granddaughter and great granddaughter present in the treatment room. The granddaughter is speaking primarily for her grandfather and is concerned about ongoing peripheral edema and scaling in the feet. Patient is under management of primary care physician and currently taking diuretics and medication for peripheral edema as well as insulin for diabetes. The granddaughter is also requesting trimming of the toenails because of the deformity and inability for the patient or granddaughter trim the toenails  Patient is diabetic without history of skin ulceration or amputation. Patient does complain of calf cramping suggesting claudication Patient is a former smoker  Review of Systems  Respiratory: Positive for shortness of breath.   Cardiovascular: Positive for leg swelling.       Calf pain with walking  Gastrointestinal: Positive for constipation.  Musculoskeletal: Positive for gait problem.  All other systems reviewed and are negative.      Objective:   Physical Exam  Patient is slow to responding to direct questioning  Vascular: Bilateral peripheral pitting edema DP pulses nonpalpable bilaterally PT pulses nonpalpable bilaterally Capillary reflex delayed bilaterally  Neurological: Sensation to 10 g monofilament wire intact 1/5 right and 2/5 left (patient has difficult responding) Vibratory sensation nonreactive bilaterally (patient has difficult responding) Ankle reflexes nonreactive bilaterally  Dermatological: No open skin lesions bilaterally The toenails are extremely elongated, deformed, discolored and patient is reactive to direct palpation Plantar scaling bilaterally without open lesions Moccasin appearance of dry skin bilaterally  Musculoskeletal: Manual motor testing dorsi flexion, plantar flexion 5/5  bilaterally There is no restriction in range of motion of ankle, subtalar, midtarsal joints bilaterally        Assessment & Plan:   Assessment: Peripheral edema bilaterally Nonpalpable pedal pulses bilaterally indication for lower extremity arterial Doppler bilaterally Diabetic peripheral neuropathy Mycotic toenails 6-10 Plantar scaling bilaterally  Plan: Today I reviewed the results with patient in granddaughters present treatment room and recommended referral to the vascular lab for lower extremity arterial Doppler for the indication of nonpalpable pedal pulses, claudication and diabetes The toenails were debrided 6-10 mechanically and legs without a bleeding Demonstrated trimming of the scaling lesions or granddaughter Recommended all-purpose moisturizing skin cream such as Vaseline intensive care to apply twice a day  Notify patient upon receipt of results of lower extremity arterial Doppler  Reappoint 3 months for toenail debridement

## 2017-01-07 NOTE — Patient Instructions (Signed)
[facility] diabetic foot screen demonstrated absent pedal pulses. Will order a circulation tests, lower extremity arterial Doppler and the vascular lab we'll contact you to schedule an appointment Do not soak your feet other than normal hygiene Apply all-purpose skin lotion to your feet twice daily  Diabetes and Foot Care Diabetes may cause you to have problems because of poor blood supply (circulation) to your feet and legs. This may cause the skin on your feet to become thinner, break easier, and heal more slowly. Your skin may become dry, and the skin may peel and crack. You may also have nerve damage in your legs and feet causing decreased feeling in them. You may not notice minor injuries to your feet that could lead to infections or more serious problems. Taking care of your feet is one of the most important things you can do for yourself. Follow these instructions at home:  Wear shoes at all times, even in the house. Do not go barefoot. Bare feet are easily injured.  Check your feet daily for blisters, cuts, and redness. If you cannot see the bottom of your feet, use a mirror or ask someone for help.  Wash your feet with warm water (do not use hot water) and mild soap. Then pat your feet and the areas between your toes until they are completely dry. Do not soak your feet as this can dry your skin.  Apply a moisturizing lotion or petroleum jelly (that does not contain alcohol and is unscented) to the skin on your feet and to dry, brittle toenails. Do not apply lotion between your toes.  Trim your toenails straight across. Do not dig under them or around the cuticle. File the edges of your nails with an emery board or nail file.  Do not cut corns or calluses or try to remove them with medicine.  Wear clean socks or stockings every day. Make sure they are not too tight. Do not wear knee-high stockings since they may decrease blood flow to your legs.  Wear shoes that fit properly and have enough  cushioning. To break in new shoes, wear them for just a few hours a day. This prevents you from injuring your feet. Always look in your shoes before you put them on to be sure there are no objects inside.  Do not cross your legs. This may decrease the blood flow to your feet.  If you find a minor scrape, cut, or break in the skin on your feet, keep it and the skin around it clean and dry. These areas may be cleansed with mild soap and water. Do not cleanse the area with peroxide, alcohol, or iodine.  When you remove an adhesive bandage, be sure not to damage the skin around it.  If you have a wound, look at it several times a day to make sure it is healing.  Do not use heating pads or hot water bottles. They may burn your skin. If you have lost feeling in your feet or legs, you may not know it is happening until it is too late.  Make sure your health care provider performs a complete foot exam at least annually or more often if you have foot problems. Report any cuts, sores, or bruises to your health care provider immediately. Contact a health care provider if:  You have an injury that is not healing.  You have cuts or breaks in the skin.  You have an ingrown nail.  You notice redness on your  legs or feet.  You feel burning or tingling in your legs or feet.  You have pain or cramps in your legs and feet.  Your legs or feet are numb.  Your feet always feel cold. Get help right away if:  There is increasing redness, swelling, or pain in or around a wound.  There is a red line that goes up your leg.  Pus is coming from a wound.  You develop a fever or as directed by your health care provider.  You notice a bad smell coming from an ulcer or wound. This information is not intended to replace advice given to you by your health care provider. Make sure you discuss any questions you have with your health care provider. Document Released: 11/28/2000 Document Revised: 05/08/2016  Document Reviewed: 05/10/2013 Elsevier Interactive Patient Education  2017 [employer].

## 2017-01-08 NOTE — Addendum Note (Signed)
Addended by: Harriett Sine D on: 01/08/2017 10:20 AM   Modules accepted: Orders

## 2017-01-30 ENCOUNTER — Inpatient Hospital Stay (HOSPITAL_COMMUNITY): Admission: RE | Admit: 2017-01-30 | Payer: Medicare HMO | Source: Ambulatory Visit

## 2017-01-30 ENCOUNTER — Encounter (HOSPITAL_COMMUNITY): Payer: Medicare HMO

## 2017-02-05 ENCOUNTER — Encounter (HOSPITAL_COMMUNITY): Payer: Medicare HMO

## 2017-03-20 ENCOUNTER — Inpatient Hospital Stay (HOSPITAL_COMMUNITY): Admission: RE | Admit: 2017-03-20 | Payer: Medicare HMO | Source: Ambulatory Visit

## 2017-04-08 ENCOUNTER — Ambulatory Visit: Payer: Medicare HMO | Admitting: Podiatry

## 2017-04-23 ENCOUNTER — Ambulatory Visit (INDEPENDENT_AMBULATORY_CARE_PROVIDER_SITE_OTHER): Payer: Medicare HMO | Admitting: Otolaryngology

## 2017-05-18 ENCOUNTER — Ambulatory Visit (INDEPENDENT_AMBULATORY_CARE_PROVIDER_SITE_OTHER): Payer: Medicare HMO | Admitting: Otolaryngology

## 2017-05-18 DIAGNOSIS — H9071 Mixed conductive and sensorineural hearing loss, unilateral, right ear, with unrestricted hearing on the contralateral side: Secondary | ICD-10-CM

## 2017-05-18 DIAGNOSIS — J343 Hypertrophy of nasal turbinates: Secondary | ICD-10-CM | POA: Diagnosis not present

## 2017-05-18 DIAGNOSIS — J310 Chronic rhinitis: Secondary | ICD-10-CM | POA: Diagnosis not present

## 2017-05-18 DIAGNOSIS — H6123 Impacted cerumen, bilateral: Secondary | ICD-10-CM | POA: Diagnosis not present

## 2017-05-18 DIAGNOSIS — H903 Sensorineural hearing loss, bilateral: Secondary | ICD-10-CM

## 2017-06-22 ENCOUNTER — Ambulatory Visit (INDEPENDENT_AMBULATORY_CARE_PROVIDER_SITE_OTHER): Payer: Medicare HMO | Admitting: Otolaryngology

## 2017-07-27 ENCOUNTER — Telehealth: Payer: Self-pay | Admitting: *Deleted

## 2017-07-27 NOTE — Telephone Encounter (Signed)
This is [name] please give me a call.   Yes I was in to See Dr Amalia Hailey the beginning of the year and he was going to send me for some tests then my feet got better.  Now I can't remember the phone number or where he sent me.   Confirmed through the chart patient was referred to [facility] for lower extremity dopplers with ABI's.  Gave the patient the phone number to [facility].

## 2017-07-28 ENCOUNTER — Other Ambulatory Visit: Payer: Self-pay

## 2017-07-28 ENCOUNTER — Telehealth: Payer: Self-pay | Admitting: Podiatry

## 2017-07-28 DIAGNOSIS — I739 Peripheral vascular disease, unspecified: Secondary | ICD-10-CM

## 2017-07-28 DIAGNOSIS — E0841 Diabetes mellitus due to underlying condition with diabetic mononeuropathy: Secondary | ICD-10-CM

## 2017-07-28 DIAGNOSIS — R0989 Other specified symptoms and signs involving the circulatory and respiratory systems: Secondary | ICD-10-CM

## 2017-07-28 NOTE — Telephone Encounter (Signed)
This is Patent examiner with [facility]. Pt was scheduled to come back in February for two ultra sounds that Dr. Amalia Hailey had ordered. Pt cancelled and is now calling back to reschedule. We would need new orders before we can see the patient. If Dr. Amalia Hailey does want this done you can fax [place] the new order. If you have questions you can call me at [phone].

## 2017-07-28 NOTE — Telephone Encounter (Signed)
New orders put in and faxed to [facility] at [place]

## 2017-07-29 ENCOUNTER — Ambulatory Visit (HOSPITAL_COMMUNITY)
Admission: RE | Admit: 2017-07-29 | Discharge: 2017-07-29 | Disposition: A | Discharge status: Home / Self Care | Payer: Medicare HMO | Source: Ambulatory Visit | Attending: Vascular Surgery | Admitting: Vascular Surgery

## 2017-07-29 ENCOUNTER — Ambulatory Visit (INDEPENDENT_AMBULATORY_CARE_PROVIDER_SITE_OTHER)
Admission: RE | Admit: 2017-07-29 | Discharge: 2017-07-29 | Disposition: A | Discharge status: Home / Self Care | Payer: Medicare HMO | Source: Ambulatory Visit | Attending: Vascular Surgery | Admitting: Vascular Surgery

## 2017-07-29 DIAGNOSIS — E0841 Diabetes mellitus due to underlying condition with diabetic mononeuropathy: Secondary | ICD-10-CM | POA: Diagnosis present

## 2017-07-29 DIAGNOSIS — I739 Peripheral vascular disease, unspecified: Secondary | ICD-10-CM

## 2017-07-29 DIAGNOSIS — I7092 Chronic total occlusion of artery of the extremities: Secondary | ICD-10-CM | POA: Insufficient documentation

## 2017-07-29 DIAGNOSIS — R0989 Other specified symptoms and signs involving the circulatory and respiratory systems: Secondary | ICD-10-CM

## 2017-08-13 ENCOUNTER — Telehealth: Payer: Self-pay | Admitting: Podiatry

## 2017-08-13 ENCOUNTER — Telehealth: Payer: Self-pay | Admitting: *Deleted

## 2017-08-13 DIAGNOSIS — R0989 Other specified symptoms and signs involving the circulatory and respiratory systems: Secondary | ICD-10-CM

## 2017-08-13 NOTE — Telephone Encounter (Signed)
Routed message to Dr. Amalia Hailey to review and advise.

## 2017-08-13 NOTE — Telephone Encounter (Signed)
Pt requested results from [facility] testing.

## 2017-08-13 NOTE — Telephone Encounter (Signed)
I was calling to see if you have my results from where I went to [facility] ago.

## 2017-08-18 ENCOUNTER — Encounter: Payer: Self-pay | Admitting: Vascular Surgery

## 2017-08-18 NOTE — Telephone Encounter (Signed)
Dr. Amalia Hailey reviewed results of 07/29/2017, ordered refer pt to [facility] for evaluation and treatment of abnormal dopplers. Referral, clinicals and demographics faxed to [facility]. I informed pt of Dr. Phoebe Perch review of results and referral to [facility] [phone].

## 2017-08-19 ENCOUNTER — Encounter: Payer: Medicare HMO | Admitting: Vascular Surgery

## 2017-09-17 ENCOUNTER — Encounter: Payer: Medicare HMO | Admitting: Vascular Surgery

## 2017-09-21 ENCOUNTER — Encounter: Payer: Medicare HMO | Admitting: Vascular Surgery

## 2017-10-23 ENCOUNTER — Encounter: Payer: Medicare HMO | Admitting: Vascular Surgery

## 2017-12-22 ENCOUNTER — Emergency Department (HOSPITAL_COMMUNITY): Payer: Medicare HMO

## 2017-12-22 ENCOUNTER — Encounter (HOSPITAL_COMMUNITY): Payer: Self-pay | Admitting: *Deleted

## 2017-12-22 DIAGNOSIS — I5032 Chronic diastolic (congestive) heart failure: Secondary | ICD-10-CM | POA: Diagnosis not present

## 2017-12-22 DIAGNOSIS — I130 Hypertensive heart and chronic kidney disease with heart failure and stage 1 through stage 4 chronic kidney disease, or unspecified chronic kidney disease: Secondary | ICD-10-CM | POA: Diagnosis not present

## 2017-12-22 DIAGNOSIS — Z7982 Long term (current) use of aspirin: Secondary | ICD-10-CM | POA: Insufficient documentation

## 2017-12-22 DIAGNOSIS — N184 Chronic kidney disease, stage 4 (severe): Secondary | ICD-10-CM | POA: Diagnosis not present

## 2017-12-22 DIAGNOSIS — Z79899 Other long term (current) drug therapy: Secondary | ICD-10-CM | POA: Insufficient documentation

## 2017-12-22 DIAGNOSIS — R0602 Shortness of breath: Secondary | ICD-10-CM | POA: Diagnosis present

## 2017-12-22 DIAGNOSIS — M86672 Other chronic osteomyelitis, left ankle and foot: Principal | ICD-10-CM | POA: Insufficient documentation

## 2017-12-22 DIAGNOSIS — E1151 Type 2 diabetes mellitus with diabetic peripheral angiopathy without gangrene: Secondary | ICD-10-CM | POA: Diagnosis not present

## 2017-12-22 DIAGNOSIS — I259 Chronic ischemic heart disease, unspecified: Secondary | ICD-10-CM | POA: Insufficient documentation

## 2017-12-22 DIAGNOSIS — R1110 Vomiting, unspecified: Secondary | ICD-10-CM | POA: Diagnosis not present

## 2017-12-22 DIAGNOSIS — E1165 Type 2 diabetes mellitus with hyperglycemia: Secondary | ICD-10-CM | POA: Insufficient documentation

## 2017-12-22 DIAGNOSIS — Z794 Long term (current) use of insulin: Secondary | ICD-10-CM | POA: Diagnosis not present

## 2017-12-22 LAB — BASIC METABOLIC PANEL
ANION GAP: 14 (ref 5–15)
BUN: 36 mg/dL — ABNORMAL HIGH (ref 6–20)
CO2: 22 mmol/L (ref 22–32)
Calcium: 9.3 mg/dL (ref 8.9–10.3)
Chloride: 95 mmol/L — ABNORMAL LOW (ref 101–111)
Creatinine, Ser: 2.28 mg/dL — ABNORMAL HIGH (ref 0.61–1.24)
GFR, EST AFRICAN AMERICAN: 29 mL/min — AB (ref >60)
GFR, EST NON AFRICAN AMERICAN: 25 mL/min — AB (ref >60)
GLUCOSE: 454 mg/dL — AB (ref 65–99)
POTASSIUM: 3.9 mmol/L (ref 3.5–5.1)
Sodium: 131 mmol/L — ABNORMAL LOW (ref 135–145)

## 2017-12-22 LAB — CBG MONITORING, ED: GLUCOSE-CAPILLARY: 354 mg/dL — AB (ref 65–99)

## 2017-12-22 NOTE — ED Triage Notes (Signed)
Pt with sob for a month, N/V x 2 today.  Family member reports pt has a wound to right foot, pt is a diabetic.  Pt has not checked CBG at home today, states was in the 300's yesterday.

## 2017-12-22 NOTE — ED Notes (Signed)
While in xray, pt informed Fritz Pickerel CT tech that it is his left foot that is having pain; order for xray changed from right foot to left foot

## 2017-12-23 ENCOUNTER — Observation Stay (HOSPITAL_COMMUNITY)
Admission: EM | Admit: 2017-12-23 | Discharge: 2017-12-24 | Disposition: A | Discharge status: Home / Self Care | Payer: Medicare HMO | Attending: Internal Medicine | Admitting: Internal Medicine

## 2017-12-23 ENCOUNTER — Observation Stay (HOSPITAL_BASED_OUTPATIENT_CLINIC_OR_DEPARTMENT_OTHER): Payer: Medicare HMO

## 2017-12-23 ENCOUNTER — Emergency Department (HOSPITAL_COMMUNITY): Payer: Medicare HMO

## 2017-12-23 ENCOUNTER — Observation Stay (HOSPITAL_COMMUNITY): Payer: Medicare HMO

## 2017-12-23 DIAGNOSIS — E785 Hyperlipidemia, unspecified: Secondary | ICD-10-CM | POA: Diagnosis present

## 2017-12-23 DIAGNOSIS — I4891 Unspecified atrial fibrillation: Secondary | ICD-10-CM | POA: Diagnosis present

## 2017-12-23 DIAGNOSIS — I10 Essential (primary) hypertension: Secondary | ICD-10-CM | POA: Diagnosis present

## 2017-12-23 DIAGNOSIS — E038 Other specified hypothyroidism: Secondary | ICD-10-CM | POA: Diagnosis present

## 2017-12-23 DIAGNOSIS — E1169 Type 2 diabetes mellitus with other specified complication: Secondary | ICD-10-CM | POA: Diagnosis not present

## 2017-12-23 DIAGNOSIS — L0390 Cellulitis, unspecified: Secondary | ICD-10-CM | POA: Diagnosis present

## 2017-12-23 DIAGNOSIS — L97509 Non-pressure chronic ulcer of other part of unspecified foot with unspecified severity: Secondary | ICD-10-CM

## 2017-12-23 DIAGNOSIS — E1159 Type 2 diabetes mellitus with other circulatory complications: Secondary | ICD-10-CM | POA: Diagnosis present

## 2017-12-23 DIAGNOSIS — M869 Osteomyelitis, unspecified: Secondary | ICD-10-CM | POA: Diagnosis not present

## 2017-12-23 DIAGNOSIS — R739 Hyperglycemia, unspecified: Secondary | ICD-10-CM

## 2017-12-23 DIAGNOSIS — E11621 Type 2 diabetes mellitus with foot ulcer: Secondary | ICD-10-CM

## 2017-12-23 DIAGNOSIS — I5033 Acute on chronic diastolic (congestive) heart failure: Secondary | ICD-10-CM | POA: Diagnosis present

## 2017-12-23 DIAGNOSIS — I509 Heart failure, unspecified: Secondary | ICD-10-CM

## 2017-12-23 DIAGNOSIS — M868X7 Other osteomyelitis, ankle and foot: Secondary | ICD-10-CM

## 2017-12-23 DIAGNOSIS — R112 Nausea with vomiting, unspecified: Secondary | ICD-10-CM

## 2017-12-23 LAB — CBC WITH DIFFERENTIAL/PLATELET
BASOS ABS: 0 10*3/uL (ref 0.0–0.1)
BASOS PCT: 0 %
Basophils Absolute: 0 10*3/uL (ref 0.0–0.1)
Basophils Relative: 0 %
EOS ABS: 0 10*3/uL (ref 0.0–0.7)
EOS PCT: 0 %
Eosinophils Absolute: 0 10*3/uL (ref 0.0–0.7)
Eosinophils Relative: 0 %
HCT: 32.9 % — ABNORMAL LOW (ref 39.0–52.0)
HEMATOCRIT: 37.3 % — AB (ref 39.0–52.0)
HEMOGLOBIN: 12 g/dL — AB (ref 13.0–17.0)
Hemoglobin: 10.6 g/dL — ABNORMAL LOW (ref 13.0–17.0)
LYMPHS PCT: 6 %
Lymphocytes Relative: 16 %
Lymphs Abs: 0.7 10*3/uL (ref 0.7–4.0)
Lymphs Abs: 1.1 10*3/uL (ref 0.7–4.0)
MCH: 30.7 pg (ref 26.0–34.0)
MCH: 30.8 pg (ref 26.0–34.0)
MCHC: 32.2 g/dL (ref 30.0–36.0)
MCHC: 32.2 g/dL (ref 30.0–36.0)
MCV: 95.4 fL (ref 78.0–100.0)
MCV: 95.6 fL (ref 78.0–100.0)
MONO ABS: 0.7 10*3/uL (ref 0.1–1.0)
MONO ABS: 0.6 10*3/uL (ref 0.1–1.0)
MONOS PCT: 9 %
Monocytes Relative: 7 %
NEUTROS ABS: 9.3 10*3/uL — AB (ref 1.7–7.7)
NEUTROS PCT: 87 %
NEUTROS PCT: 75 %
Neutro Abs: 5.4 10*3/uL (ref 1.7–7.7)
PLATELETS: 111 10*3/uL — AB (ref 150–400)
Platelets: 119 10*3/uL — ABNORMAL LOW (ref 150–400)
RBC: 3.91 MIL/uL — ABNORMAL LOW (ref 4.22–5.81)
RBC: 3.44 MIL/uL — ABNORMAL LOW (ref 4.22–5.81)
RDW: 12.6 % (ref 11.5–15.5)
RDW: 12.6 % (ref 11.5–15.5)
WBC: 10.7 10*3/uL — ABNORMAL HIGH (ref 4.0–10.5)
WBC: 7.1 10*3/uL (ref 4.0–10.5)

## 2017-12-23 LAB — C-REACTIVE PROTEIN: CRP: 17.9 mg/dL — AB (ref <1.0)

## 2017-12-23 LAB — GLUCOSE, CAPILLARY
GLUCOSE-CAPILLARY: 375 mg/dL — AB (ref 65–99)
GLUCOSE-CAPILLARY: 301 mg/dL — AB (ref 65–99)
GLUCOSE-CAPILLARY: 180 mg/dL — AB (ref 65–99)
GLUCOSE-CAPILLARY: 161 mg/dL — AB (ref 65–99)
Glucose-Capillary: 85 mg/dL (ref 65–99)

## 2017-12-23 LAB — URINALYSIS, ROUTINE W REFLEX MICROSCOPIC
BACTERIA UA: NONE SEEN
BILIRUBIN URINE: NEGATIVE
Glucose, UA: >=500 mg/dL — AB
Ketones, ur: NEGATIVE mg/dL
Leukocytes, UA: NEGATIVE
Nitrite: NEGATIVE
Protein, ur: >=300 mg/dL — AB
SPECIFIC GRAVITY, URINE: 1.017 (ref 1.005–1.030)
SQUAMOUS EPITHELIAL / LPF: NONE SEEN
pH: 7 (ref 5.0–8.0)

## 2017-12-23 LAB — HEPATIC FUNCTION PANEL
ALBUMIN: 3.7 g/dL (ref 3.5–5.0)
ALK PHOS: 70 U/L (ref 38–126)
ALT: 12 U/L — ABNORMAL LOW (ref 17–63)
AST: 16 U/L (ref 15–41)
BILIRUBIN DIRECT: 0.2 mg/dL (ref 0.1–0.5)
BILIRUBIN TOTAL: 0.8 mg/dL (ref 0.3–1.2)
Indirect Bilirubin: 0.6 mg/dL (ref 0.3–0.9)
Total Protein: 7.8 g/dL (ref 6.5–8.1)

## 2017-12-23 LAB — COMPREHENSIVE METABOLIC PANEL
ALBUMIN: 3 g/dL — AB (ref 3.5–5.0)
ALT: 11 U/L — ABNORMAL LOW (ref 17–63)
ANION GAP: 11 (ref 5–15)
AST: 15 U/L (ref 15–41)
Alkaline Phosphatase: 59 U/L (ref 38–126)
BUN: 33 mg/dL — ABNORMAL HIGH (ref 6–20)
CO2: 25 mmol/L (ref 22–32)
Calcium: 8.8 mg/dL — ABNORMAL LOW (ref 8.9–10.3)
Chloride: 97 mmol/L — ABNORMAL LOW (ref 101–111)
Creatinine, Ser: 2.19 mg/dL — ABNORMAL HIGH (ref 0.61–1.24)
GFR calc non Af Amer: 27 mL/min — ABNORMAL LOW (ref >60)
GFR, EST AFRICAN AMERICAN: 31 mL/min — AB (ref >60)
GLUCOSE: 233 mg/dL — AB (ref 65–99)
POTASSIUM: 3.3 mmol/L — AB (ref 3.5–5.1)
SODIUM: 133 mmol/L — AB (ref 135–145)
Total Bilirubin: 0.8 mg/dL (ref 0.3–1.2)
Total Protein: 6.4 g/dL — ABNORMAL LOW (ref 6.5–8.1)

## 2017-12-23 LAB — LIPASE, BLOOD: Lipase: 21 U/L (ref 11–51)

## 2017-12-23 LAB — HEMOGLOBIN A1C
HEMOGLOBIN A1C: 10.4 % — AB (ref 4.8–5.6)
MEAN PLASMA GLUCOSE: 251.78 mg/dL

## 2017-12-23 LAB — ECHOCARDIOGRAM COMPLETE
Height: 65 in
WEIGHTICAEL: 3164.04 [oz_av]

## 2017-12-23 LAB — CBG MONITORING, ED: Glucose-Capillary: 424 mg/dL — ABNORMAL HIGH (ref 65–99)

## 2017-12-23 LAB — BRAIN NATRIURETIC PEPTIDE: B NATRIURETIC PEPTIDE 5: 140 pg/mL — AB (ref 0.0–100.0)

## 2017-12-23 LAB — SEDIMENTATION RATE: SED RATE: 82 mm/h — AB (ref 0–16)

## 2017-12-23 MED ORDER — VANCOMYCIN HCL IN DEXTROSE 1-5 GM/200ML-% IV SOLN: 1000.0000 mg | INTRAVENOUS | Status: DC

## 2017-12-23 MED ORDER — INSULIN GLARGINE 100 UNIT/ML ~~LOC~~ SOLN
40.0000 [IU] | Freq: Once | SUBCUTANEOUS | Status: AC
  Administered 2017-12-23: 40 [IU] via SUBCUTANEOUS
  Filled 2017-12-23: qty 0.4

## 2017-12-23 MED ORDER — OXYCODONE-ACETAMINOPHEN 5-325 MG PO TABS
2.0000 | ORAL_TABLET | Freq: Four times a day (QID) | ORAL | Status: DC | PRN
  Administered 2017-12-23 (×2): 2 via ORAL
  Filled 2017-12-23 (×2): qty 2

## 2017-12-23 MED ORDER — INSULIN ASPART 100 UNIT/ML ~~LOC~~ SOLN: 0.0000 [IU] | Freq: Every day | SUBCUTANEOUS | Status: DC

## 2017-12-23 MED ORDER — INSULIN ASPART 100 UNIT/ML ~~LOC~~ SOLN
10.0000 [IU] | Freq: Three times a day (TID) | SUBCUTANEOUS | Status: DC
  Filled 2017-12-23 (×24): qty 0.16

## 2017-12-23 MED ORDER — ACETAMINOPHEN 325 MG PO TABS: 650.0000 mg | ORAL_TABLET | Freq: Four times a day (QID) | ORAL | Status: DC | PRN

## 2017-12-23 MED ORDER — ENOXAPARIN SODIUM 30 MG/0.3ML ~~LOC~~ SOLN
30.0000 mg | SUBCUTANEOUS | Status: DC
  Administered 2017-12-23: 30 mg via SUBCUTANEOUS
  Filled 2017-12-23: qty 0.3

## 2017-12-23 MED ORDER — INSULIN ASPART 100 UNIT/ML ~~LOC~~ SOLN
10.0000 [IU] | Freq: Once | SUBCUTANEOUS | Status: AC
  Administered 2017-12-23: 10 [IU] via SUBCUTANEOUS
  Filled 2017-12-23: qty 1

## 2017-12-23 MED ORDER — ACETAMINOPHEN 325 MG PO TABS
650.0000 mg | ORAL_TABLET | Freq: Four times a day (QID) | ORAL | Status: DC | PRN
  Administered 2017-12-24: 650 mg via ORAL
  Filled 2017-12-23: qty 2

## 2017-12-23 MED ORDER — AMLODIPINE BESYLATE 5 MG PO TABS
10.0000 mg | ORAL_TABLET | Freq: Every day | ORAL | Status: DC
  Administered 2017-12-24: 10 mg via ORAL
  Filled 2017-12-23: qty 2

## 2017-12-23 MED ORDER — CEFTAROLINE FOSAMIL 600 MG IV SOLR: 600.0000 mg | Freq: Two times a day (BID) | INTRAVENOUS | Status: DC

## 2017-12-23 MED ORDER — ONDANSETRON HCL 4 MG PO TABS: 4.0000 mg | ORAL_TABLET | Freq: Four times a day (QID) | ORAL | Status: DC | PRN

## 2017-12-23 MED ORDER — ENOXAPARIN SODIUM 40 MG/0.4ML ~~LOC~~ SOLN: 40.0000 mg | SUBCUTANEOUS | Status: DC

## 2017-12-23 MED ORDER — ASPIRIN EC 81 MG PO TBEC
81.0000 mg | DELAYED_RELEASE_TABLET | Freq: Every day | ORAL | Status: DC
  Administered 2017-12-23 – 2017-12-24 (×2): 81 mg via ORAL
  Filled 2017-12-23 (×2): qty 1

## 2017-12-23 MED ORDER — ATORVASTATIN CALCIUM 40 MG PO TABS
40.0000 mg | ORAL_TABLET | Freq: Every day | ORAL | Status: DC
Start: 2017-12-23 — End: 2017-12-24
  Administered 2017-12-23: 40 mg via ORAL
  Filled 2017-12-23: qty 1

## 2017-12-23 MED ORDER — LISINOPRIL 5 MG PO TABS
5.0000 mg | ORAL_TABLET | Freq: Every day | ORAL | Status: DC
  Administered 2017-12-24: 5 mg via ORAL
  Filled 2017-12-23: qty 1

## 2017-12-23 MED ORDER — SODIUM CHLORIDE 0.9 % IV BOLUS (SEPSIS)
500.0000 mL | Freq: Once | INTRAVENOUS | Status: AC
  Administered 2017-12-23: 500 mL via INTRAVENOUS

## 2017-12-23 MED ORDER — INSULIN ASPART 100 UNIT/ML ~~LOC~~ SOLN
0.0000 [IU] | Freq: Three times a day (TID) | SUBCUTANEOUS | Status: DC
  Administered 2017-12-23: 4 [IU] via SUBCUTANEOUS
  Administered 2017-12-23: 15 [IU] via SUBCUTANEOUS

## 2017-12-23 MED ORDER — INSULIN GLARGINE 100 UNIT/ML ~~LOC~~ SOLN
60.0000 [IU] | Freq: Every day | SUBCUTANEOUS | Status: DC
  Administered 2017-12-23: 60 [IU] via SUBCUTANEOUS
  Filled 2017-12-23 (×2): qty 0.6

## 2017-12-23 MED ORDER — LACTINEX PO CHEW
2.0000 | CHEWABLE_TABLET | Freq: Three times a day (TID) | ORAL | Status: DC
  Administered 2017-12-23 – 2017-12-24 (×3): 2 via ORAL
  Filled 2017-12-23 (×6): qty 2

## 2017-12-23 MED ORDER — ONDANSETRON HCL 4 MG/2ML IJ SOLN: 4.0000 mg | Freq: Four times a day (QID) | INTRAMUSCULAR | Status: DC | PRN

## 2017-12-23 MED ORDER — ONDANSETRON HCL 4 MG/2ML IJ SOLN
4.0000 mg | Freq: Once | INTRAMUSCULAR | Status: AC
  Administered 2017-12-23: 4 mg via INTRAVENOUS
  Filled 2017-12-23: qty 2

## 2017-12-23 MED ORDER — PERFLUTREN LIPID MICROSPHERE
1.0000 mL | INTRAVENOUS | Status: AC | PRN
  Administered 2017-12-23: 2 mL via INTRAVENOUS
  Filled 2017-12-23: qty 10

## 2017-12-23 MED ORDER — PIPERACILLIN-TAZOBACTAM 3.375 G IVPB 30 MIN
3.3750 g | Freq: Once | INTRAVENOUS | Status: AC
  Administered 2017-12-23: 3.375 g via INTRAVENOUS
  Filled 2017-12-23: qty 50

## 2017-12-23 MED ORDER — CEFTAROLINE FOSAMIL 400 MG IV SOLR
400.0000 mg | Freq: Two times a day (BID) | INTRAVENOUS | Status: DC
  Administered 2017-12-23 – 2017-12-24 (×2): 400 mg via INTRAVENOUS
  Filled 2017-12-23 (×2): qty 400

## 2017-12-23 MED ORDER — ACETAMINOPHEN 650 MG RE SUPP: 650.0000 mg | Freq: Four times a day (QID) | RECTAL | Status: DC | PRN

## 2017-12-23 MED ORDER — AMLODIPINE BESYLATE 5 MG PO TABS
5.0000 mg | ORAL_TABLET | Freq: Every day | ORAL | Status: DC
  Administered 2017-12-23: 5 mg via ORAL
  Filled 2017-12-23: qty 1

## 2017-12-23 MED ORDER — LEVOTHYROXINE SODIUM 50 MCG PO TABS
50.0000 ug | ORAL_TABLET | Freq: Every day | ORAL | Status: DC
  Administered 2017-12-24: 50 ug via ORAL
  Filled 2017-12-23 (×2): qty 1

## 2017-12-23 MED ORDER — PIPERACILLIN-TAZOBACTAM 3.375 G IVPB
3.3750 g | Freq: Three times a day (TID) | INTRAVENOUS | Status: DC
  Administered 2017-12-23: 3.375 g via INTRAVENOUS
  Filled 2017-12-23: qty 50

## 2017-12-23 MED ORDER — VANCOMYCIN HCL IN DEXTROSE 1-5 GM/200ML-% IV SOLN
1000.0000 mg | Freq: Once | INTRAVENOUS | Status: AC
  Administered 2017-12-23: 1000 mg via INTRAVENOUS
  Filled 2017-12-23: qty 200

## 2017-12-23 MED ORDER — METOPROLOL SUCCINATE ER 25 MG PO TB24
12.5000 mg | ORAL_TABLET | Freq: Every day | ORAL | Status: DC
  Administered 2017-12-23: 12.5 mg via ORAL
  Filled 2017-12-23 (×2): qty 1

## 2017-12-23 MED ORDER — METOPROLOL SUCCINATE ER 50 MG PO TB24
50.0000 mg | ORAL_TABLET | Freq: Every day | ORAL | Status: DC
  Administered 2017-12-24: 50 mg via ORAL
  Filled 2017-12-23: qty 1

## 2017-12-23 MED ORDER — LISINOPRIL 10 MG PO TABS
20.0000 mg | ORAL_TABLET | Freq: Every day | ORAL | Status: DC
  Administered 2017-12-23: 20 mg via ORAL
  Filled 2017-12-23: qty 2

## 2017-12-23 NOTE — Progress Notes (Signed)
PROGRESS NOTE    Patient: [name]     PCP: Deloria Lair., MD                    DOB: 06/08/1935            DOA: 12/23/2017 [id number]:[id number]             DOS: 12/23/2017, 2:14 PM   Date of Service: the patient was seen and examined on 12/23/2017 Subjective:  She was seen and examined, stable no acute distress, family members present at bedside No issues overnight  Denies any fever chills nausea vomiting, denies any chest pain or shortness of breath this morning  ----------------------------------------------------------------------------------------------------------------------  Brief Narrative:   [name] is a 83 y.o. male with medical history significant for diabetes, CAD, atrial fibrillation, dyslipidemia, hypertension, hypothyroidism, and CKD who presents with worsening wound to his left foot that has been ongoing since Thanksgiving.  He has been unable to bear weight and ambulate adequately as a result of the wound.  He finally had an appointment with the wound clinic earlier today but could not make it due to nausea and vomiting that began to worsen after it started yesterday.  He denies any abdominal pain, diarrhea, constipation, fever, or chills.  The patient does have chronic shortness of breath and lower extremity swelling, but denies any chest pain.  He states that he is typically hyperglycemic with blood glucose in the 300 range.  Upon admission vitals were stable, mild leukocytosis of 10,000, sodium was at 131, mildly elevated BUN/creatinine 36/2.28 glucose 454 CT of abdomen pelvis was within normal resection reporting a mild gallbladder distention, chest x-ray reported cardiomegaly mild interstitial marking consistent with IDL.  Left foot x-ray demonstrated changes consistent with left first MTP acute osteomyelitis Patient was initiated on IV fluids vancomycin and Zosyn Blood cultures were obtained  Assessment & Plan:    Principal Problem:   Osteomyelitis due  to type 2 diabetes mellitus (HCC) Active Problems:   Hyperlipidemia   Essential hypertension   Atrial fibrillation (HCC)   Chronic diastolic CHF (congestive heart failure) (HCC)   DM type 2 causing vascular disease ([place])   Other specified hypothyroidism   Nausea and vomiting   Osteomyelitis (HCC)  Left foot pain suspect secondary to left first MTP acute osteomyelitis Continue IV vancomycin and Zosyn -changing antibiotics to Teflaro (Duration of limited vancomycin secondary to CKD, monotherapy for broader coverage including MRSA, also MRI ruled out osteomyelitis)  Blood culture x2 sets pending, will evaluate for wound cultures Left foot MRI -revealing no acute osteomyelitis of the left foot severe soft tissue edema along with the dorsal aspect of the foot concerning for cellulitis Elevated ESR, CRP   Intractable nausea and vomiting with possible acute cholecystitis versus gastroenteritis CT of abdomen pelvis was reviewed essentially within normal limits with exception of mild distended gallbladder Normal ultrasound studies, LFTs within normal limits, normal T bili Clear liquid diet and advance as tolerated  Chronic dyspnea with edema likely secondary to diastolic CHF 2D echocardiogram today December 23, 2017: was reviewed ejection fraction 60-65% mild to moderately calcified valves Prior 2D echocardiogram on 02/2016 with findings of LVH and EF 60-65% Hold home Lasix for now Hold IVF  CKD stage IV  At baseline, BUN/creatinine 33/2.19 Monitor I/Os and daily weights  Hypothyroidism Continue home levothyroxine  Type 2 diabetes with hyperglycemia Continue home insulin regimen Resistance sliding scale insulin A1c -10.4  Mild hyponatremia -Likely related to dehydration and  hyperglycemia Continue IV fluid   Hypertension Continue amlodipine, lisinopril, and metoprolol (will reduce the lisinopril and increase metoprolol)   Atrial fibrillation-currently in sinus  rhythm Continue metoprolol and monitor rate On ASA 25m daily  DVT prophylaxis:  Changed from Lovenox to SQ heparin due to elevated BUN/creatinine Code Status: Full Family Communication: Daughter at bedside Disposition Plan:Home Consults called:None Admission status: Obs, med-surg     Disposition Plan:  > 3 days            Home              Consultants:   None   Procedures:  No admission procedures for hospital encounter.     Antimicrobials:  Anti-infectives (From admission, onward)   Start     Dose/Rate Route Frequency Ordered Stop   12/23/17 2200  vancomycin (VANCOCIN) IVPB 1000 mg/200 mL premix     1,000 mg 200 mL/hr over 60 Minutes Intravenous Every 24 hours 12/23/17 0737     12/23/17 1100  piperacillin-tazobactam (ZOSYN) IVPB 3.375 g     3.375 g 12.5 mL/hr over 240 Minutes Intravenous Every 8 hours 12/23/17 0736     12/23/17 0245  vancomycin (VANCOCIN) IVPB 1000 mg/200 mL premix     1,000 mg 200 mL/hr over 60 Minutes Intravenous  Once 12/23/17 0237 12/23/17 0453   12/23/17 0245  piperacillin-tazobactam (ZOSYN) IVPB 3.375 g     3.375 g 100 mL/hr over 30 Minutes Intravenous  Once 12/23/17 0237 12/23/17 0350        Objective: Vitals:   12/23/17 0300 12/23/17 0430 12/23/17 0500 12/23/17 0603  BP: 107/69 (!) 105/48 (!) 142/54 (!) 158/70  Pulse: 77 (!) 56 70 71  Resp: (!) 21 (!) 23 (!) 21 20  Temp:    98.4 F (36.9 C)  TempSrc:    Oral  SpO2: 92% 98% 97% 96%  Weight:    89.7 kg (197 lb 12 oz)  Height:    5' 5" (1.651 m)    Intake/Output Summary (Last 24 hours) at 12/23/2017 1414 Last data filed at 12/23/2017 0453 Gross per 24 hour  Intake 750 ml  Output -  Net 750 ml   Filed Weights   12/22/17 2235 12/23/17 0603  Weight: 90.7 kg (200 lb) 89.7 kg (197 lb 12 oz)    Examination:  General exam: Appears calm and comfortable  Respiratory system: Clear to auscultation. Respiratory effort normal. Cardiovascular system: S1 & S2 heard, RRR. No JVD,  murmurs, rubs, gallops or clicks. No pedal edema. Gastrointestinal system: Abdomen is nondistended, soft and nontender. No organomegaly or masses felt. Normal bowel sounds heard. Central nervous system: Alert and oriented. No focal neurological deficits. Extremities: Symmetric 5 x 5 power. Skin: No rashes, lesions or ulcers Psychiatry: Judgement and insight appear normal. Mood & affect appropriate.     Data Reviewed: I have personally reviewed following labs and imaging studies  CBC: Recent Labs  Lab 12/22/17 2329 12/23/17 0905  WBC 10.7* 7.1  NEUTROABS 9.3* 5.4  HGB 12.0* 10.6*  HCT 37.3* 32.9*  MCV 95.4 95.6  PLT 119* 1423   Basic Metabolic Panel: Recent Labs  Lab 12/22/17 2329 12/23/17 0908  NA 131* 133*  K 3.9 3.3*  CL 95* 97*  CO2 22 25  GLUCOSE 454* 233*  BUN 36* 33*  CREATININE 2.28* 2.19*  CALCIUM 9.3 8.8*   GFR: Estimated Creatinine Clearance: 27.7 mL/min (A) (by C-G formula based on SCr of 2.19 mg/dL (H)). Liver Function Tests: Recent Labs  Lab 12/22/17 2329 12/23/17 0908  AST 16 15  ALT 12* 11*  ALKPHOS 70 59  BILITOT 0.8 0.8  PROT 7.8 6.4*  ALBUMIN 3.7 3.0*   Recent Labs  Lab 12/22/17 2329  LIPASE 21   No results for input(s): AMMONIA in the last 168 hours. Coagulation Profile: No results for input(s): INR, PROTIME in the last 168 hours. Cardiac Enzymes: No results for input(s): CKTOTAL, CKMB, CKMBINDEX, TROPONINI in the last 168 hours. BNP (last 3 results) No results for input(s): PROBNP in the last 8760 hours. HbA1C: Recent Labs    12/23/17 0521  HGBA1C 10.4*   CBG: Recent Labs  Lab 12/22/17 2240 12/23/17 0326 12/23/17 0552 12/23/17 0736 12/23/17 1132  GLUCAP 354* 424* 375* 301* 85   Lipid Profile: No results for input(s): CHOL, HDL, LDLCALC, TRIG, CHOLHDL, LDLDIRECT in the last 72 hours. Thyroid Function Tests: No results for input(s): TSH, T4TOTAL, FREET4, T3FREE, THYROIDAB in the last 72 hours. Anemia Panel: No  results for input(s): VITAMINB12, FOLATE, FERRITIN, TIBC, IRON, RETICCTPCT in the last 72 hours. Sepsis Labs: No results for input(s): PROCALCITON, LATICACIDVEN in the last 168 hours.  Recent Results (from the past 240 hour(s))  Blood culture (routine x 2)     Status: None (Preliminary result)   Collection Time: 12/23/17  2:45 AM  Result Value Ref Range Status   Specimen Description BLOOD LEFT HAND  Final   Special Requests   Final    BOTTLES DRAWN AEROBIC ONLY Blood Culture adequate volume   Culture NO GROWTH < 12 HOURS  Final   Report Status PENDING  Incomplete  Blood culture (routine x 2)     Status: None (Preliminary result)   Collection Time: 12/23/17  2:51 AM  Result Value Ref Range Status   Specimen Description BLOOD LEFT ARM  Final   Special Requests   Final    BOTTLES DRAWN AEROBIC ONLY Blood Culture results may not be optimal due to an inadequate volume of blood received in culture bottles   Culture NO GROWTH < 12 HOURS  Final   Report Status PENDING  Incomplete       Radiology Studies: Ct Abdomen Pelvis Wo Contrast  Result Date: 12/23/2017 CLINICAL DATA:  Nausea and vomiting.  Right upper abdominal pain. EXAM: CT ABDOMEN AND PELVIS WITHOUT CONTRAST TECHNIQUE: Multidetector CT imaging of the abdomen and pelvis was performed following the standard protocol without IV contrast. COMPARISON:  CT and abdominal ultrasound 06/12/2015 FINDINGS: Lower chest: Subpleural reticulation and scarring in the lung bases, with slight improvement from prior. Coronary artery calcifications. Mitral annulus calcifications. Hepatobiliary: No focal hepatic lesion allowing for lack contrast. Mild gallbladder distention appears similar to prior exam. No calcified stone or pericholecystic inflammation. No biliary dilatation. Pancreas: Fatty atrophy.  No ductal dilatation or inflammation. Spleen: Normal in size without focal abnormality. Adrenals/Urinary Tract: Mild left adrenal thickening without dominant  nodule. Normal right adrenal gland. Thinning of bilateral renal parenchyma with symmetric perinephric edema, similar to prior exam. Bilateral ureteral prominence without frank hydronephrosis. No urolithiasis. Urinary bladder is distended to the umbilicus. No definite bladder wall thickening. Stomach/Bowel: Small hiatal hernia. Stomach physiologically distended. No evidence of bowel inflammation, obstruction or wall thickening. Normal appendix. Moderate colonic stool burden. Vascular/Lymphatic: Advanced aortic and branch atherosclerosis. Prominent periportal nodes. Slightly prominent left external iliac node measures 7 mm. Reproductive: Prostate is unremarkable. Other: Minimal fat in the inguinal canals. Small fat containing umbilical hernia. No ascites or free air. No intra-abdominal abscess. Musculoskeletal: Multilevel degenerative  change throughout spine. There are no acute or suspicious osseous abnormalities. IMPRESSION: 1. Mild gallbladder distention without calcified stone or pericholecystic inflammation. This appears similar to prior exam. Consider right upper quadrant ultrasound for further evaluation. 2. Distended urinary bladder without wall thickening. Bilateral ureteral prominence likely secondary to bladder distention. 3.  Aortic Atherosclerosis (ICD10-I70.0). Electronically Signed   By: Jeb Levering M.D.   On: 12/23/2017 03:21   Dg Chest 2 View  Result Date: 12/23/2017 CLINICAL DATA:  Dyspnea x1 month with nausea and vomiting x2 today. EXAM: CHEST  2 VIEW COMPARISON:  02/22/2016 FINDINGS: Cardiomegaly with post CABG change. Fine interstitial and reticular lung markings bilaterally may reflect areas of minimal fibrosis. No pneumonic consolidation, effusion or pneumothorax. Undersurface spurring about the right AC joint consistent with osteoarthritis. No acute osseous abnormality. IMPRESSION: Stable cardiomegaly with fine reticular interstitial lung markings that may reflect areas of interstitial  lung disease/fibrosis. Electronically Signed   By: Ashley Royalty M.D.   On: 12/23/2017 00:16   US Abdomen Complete  Result Date: 12/23/2017 CLINICAL DATA:  Nausea and vomiting. Right upper quadrant pain, 2 days duration. EXAM: ABDOMEN ULTRASOUND COMPLETE COMPARISON:  CT same day FINDINGS: Gallbladder: No gallstones or wall thickening visualized. No sonographic Murphy sign noted by sonographer. Common bile duct: Diameter: 3 mm, normal Liver: No focal lesion identified. Within normal limits in parenchymal echogenicity. Portal vein is patent on color Doppler imaging with normal direction of blood flow towards the liver. IVC: No abnormality visualized. Pancreas: Poorly seen because of overlying bowel gas. Spleen: Size and appearance within normal limits. Right Kidney: Length: 11.0 cm. Echogenicity within normal limits. No mass or hydronephrosis visualized. Left Kidney: Length: 12.9 cm. Echogenicity within normal limits. No mass or hydronephrosis visualized. Abdominal aorta: No aneurysm visualized. Other findings: None. IMPRESSION: Normal examination. The gallbladder is full, but there are no stones and there is no sonographic evidence of cholecystitis. Electronically Signed   By: Nelson Chimes M.D.   On: 12/23/2017 10:51   Mr Foot Left Wo Contrast  Result Date: 12/23/2017 CLINICAL DATA:  Left foot wound along the plantar aspect of the first MTP joint EXAM: MRI OF THE LEFT FOOT WITHOUT CONTRAST TECHNIQUE: Multiplanar, multisequence MR imaging of the left forefoot was performed. No intravenous contrast was administered. COMPARISON:  None. FINDINGS: Patient motion degrades image quality limiting evaluation. Bones/Joint/Cartilage Soft tissue wound along the plantar aspect of the first MTP joint. No cortical destruction or periosteal reaction. No focal marrow edema. No first MTP joint effusion. No fracture or dislocation. Normal alignment. No joint effusion. Moderate osteoarthritis of the first MTP joint. Ligaments  Collateral ligaments are intact.  Lisfranc ligament is intact. Muscles and Tendons Flexor, peroneal and extensor compartment tendons are intact. No muscle atrophy. Soft tissue No fluid collection or hematoma. No soft tissue mass. Severe soft tissue edema along the dorsal aspect of the foot concerning for cellulitis. IMPRESSION: 1. No osteomyelitis of the left forefoot. 2. Severe soft tissue edema along the dorsal aspect of the foot concerning for cellulitis. Electronically Signed   By: Kathreen Devoid   On: 12/23/2017 10:13   Dg Foot Complete Left  Result Date: 12/23/2017 CLINICAL DATA:  Foot pain with associated plantar ulcer. EXAM: LEFT FOOT - COMPLETE 3+ VIEW COMPARISON:  None. FINDINGS: There is no evidence of fracture. Periarticular sclerosis at the first metatarsophalangeal joint without definite erosions. Possible erosions at the fifth metatarsophalangeal joint plantar soft tissue swelling. Vascular calcifications are noted. IMPRESSION: Periarticular sclerosis at the first metatarsophalangeal  joint. This may represent arthritic changes or chronic osteomyelitis. Possible erosions at the fifth metatarsophalangeal joint, which may be seen with acute osteomyelitis. Electronically Signed   By: Fidela Salisbury M.D.   On: 12/23/2017 00:20    Scheduled Meds: . amLODipine  5 mg Oral Daily  . aspirin EC  81 mg Oral Daily  . atorvastatin  40 mg Oral QHS  . enoxaparin (LOVENOX) injection  30 mg Subcutaneous Q24H  . insulin aspart  0-20 Units Subcutaneous TID WC  . insulin aspart  0-5 Units Subcutaneous QHS  . insulin aspart  10-16 Units Subcutaneous TID WC  . insulin glargine  60 Units Subcutaneous QHS  . levothyroxine  50 mcg Oral QAC breakfast  . lisinopril  20 mg Oral Daily  . metoprolol succinate  12.5 mg Oral Daily   Continuous Infusions: . piperacillin-tazobactam (ZOSYN)  IV 3.375 g (12/23/17 1100)  . vancomycin       LOS: 0 days    Time spent: >25 minutes   Deatra James,  MD [facility] Pager [phone]  If 7PM-7AM, please contact night-coverage [email or web address] Password [email or web address] 12/23/2017, 2:14 PM

## 2017-12-23 NOTE — Progress Notes (Signed)
Inpatient Diabetes Program Recommendations  AACE/ADA: New Consensus Statement on Inpatient Glycemic Control (2015)  Target Ranges:  Prepandial:   less than 140 mg/dL      Peak postprandial:   less than 180 mg/dL (1-2 hours)      Critically ill patients:  140 - 180 mg/dL  Results for [name], [name] (MRN [id number]) as of 12/23/2017 09:34  Ref. Range 12/22/2017 22:40 12/23/2017 03:26 12/23/2017 05:52 12/23/2017 07:36  Glucose-Capillary Latest Ref Range: 65 - 99 mg/dL 354 (H) 424 (H) 375 (H) 301 (H)    Review of Glycemic Control  Diabetes history: DM2 Outpatient Diabetes medications: Lantus 60 units QHS, Novolog 10-16 units TID with meals Current orders for Inpatient glycemic control:  Lantus 60 units QHS, Novolog 10-16 units TID with meals, Novolog 0-20 units TID with meals, Novolog 0-5 units QHS  Inpatient Diabetes Program Recommendations: Insulin - Meal Coverage: Please consider discontinuing Novolog 10-16 units TID with meals (range of meal coverage without any noted parameters). Please consider ordering Novolog 5 units TID with meals for meal coverage if patient eats at least 50% of meals.  Thanks, Barnie Alderman, RN, MSN, CDE Diabetes Coordinator Inpatient Diabetes Program [phone] (Team Pager from 8am to 5pm)

## 2017-12-23 NOTE — Care Management Obs Status (Signed)
[place] NOTIFICATION   Patient Details  Name: [name] MRN: [id number] Date of Birth: December 12, 1935   Medicare Observation Status Notification Given:  Yes    Sherald Barge, RN 12/23/2017, 11:41 AM

## 2017-12-23 NOTE — H&P (Addendum)
History and Physical    [name] ZSM:[id number] DOB: January 14, 1935 DOA: 12/23/2017  PCP: Deloria Lair., MD   Patient coming from: Home  Chief Complaint: L foot wound; N/V  HPI: [name] is a 83 y.o. male with medical history significant for diabetes, CAD, atrial fibrillation, dyslipidemia, hypertension, hypothyroidism, and CKD who presents with worsening wound to his left foot that has been ongoing since Thanksgiving.  He has been unable to bear weight and ambulate adequately as a result of the wound.  He finally had an appointment with the wound clinic earlier today but could not make it due to nausea and vomiting that began to worsen after it started yesterday.  He denies any abdominal pain, diarrhea, constipation, fever, or chills.  The patient does have chronic shortness of breath and lower extremity swelling, but denies any chest pain.  He states that he is typically hyperglycemic with blood glucose in the 300 range.   ED Course: Vital signs are noted to be stable.  Laboratory data and with leukocytosis of 10,700.  Sodium 131, chloride 95, BUN 36, creatinine 2.28, and glucose of 454.  CT of the abdomen and pelvis without contrast with mild gallbladder distention and cholelithiasis.  Chest x-ray with stable cardiomegaly and findings of interstitial markings consistent with ILD.  Left foot x-ray demonstrates changes consistent with left first MTP acute osteomyelitis.  Patient has been started on vancomycin and Zosyn.  He has also been given 40 units of his home Lantus as well as 10 of regular insulin due to his hyperglycemia.  Review of Systems: As per HPI otherwise 10 point review of systems negative.   Past Medical History:  Diagnosis Date  . Afib ([place])   . Coronary artery disease 2010   Status post coronary artery bypass grafting  . Diabetes mellitus   . Hyperlipidemia   . Hypertension   . Renal insufficiency     Past Surgical History:  Procedure Laterality Date  .  carotid insuff    . CORONARY ARTERY BYPASS GRAFT  2010   LIMA to LAD, SVG to diagonal, SVG to circumflex, marginal, SVG to posterior descending branch.     reports that  has never smoked. he has never used smokeless tobacco. He reports that he drinks alcohol. His drug history is not on file.  No Known Allergies  Family History  Problem Relation Age of Onset  . Heart attack Father   . Heart attack Brother     Prior to Admission medications   Medication Sig Start Date End Date Taking? Authorizing Provider  amLODipine (NORVASC) 5 MG tablet Take 5 mg by mouth daily.    [provider]  aspirin 81 MG tablet Take 81 mg by mouth daily.    [provider]  atorvastatin (LIPITOR) 40 MG tablet Take 1 tablet by mouth at bedtime. 01/30/16   [provider]  furosemide (LASIX) 40 MG tablet Take 1 tablet (40 mg total) by mouth daily. 02/26/16   Geradine Girt, DO  Insulin Aspart (NOVOLOG FLEXPEN [place]) Inject 10-16 Units into the skin 3 (three) times daily with meals.    [provider]  Insulin Glargine (LANTUS SOLOSTAR [place]) Inject 60 Units into the skin at bedtime.    [provider]  levothyroxine (SYNTHROID, LEVOTHROID) 50 MCG tablet Take 50 mcg by mouth daily.    [provider]  lisinopril (PRINIVIL,ZESTRIL) 20 MG tablet TAKE ONE TABLET BY MOUTH ONCE DAILY FOR BLOOD PRESSURE 01/17/16  [provider]  metoprolol succinate (TOPROL-XL) 25 MG 24 hr tablet Take 0.5 tablets (12.5 mg total) by mouth daily. 02/26/16   Geradine Girt, DO  oxyCODONE-acetaminophen (PERCOCET) 10-325 MG tablet Take 1 tablet by mouth every 6 (six) hours as needed for pain.  02/12/16   [provider]  potassium chloride SA (K-DUR,KLOR-CON) 20 MEQ tablet Take 1 tablet (20 mEq total) by mouth daily. 02/26/16   Geradine Girt, DO    Physical Exam: Vitals:   12/22/17 2239 12/23/17 0207 12/23/17 0230 12/23/17 0300  BP: (!) 161/70 (!) 169/66 125/62 107/69    Pulse: 94 85 73 77  Resp: 20 (!) 25 (!) 25 (!) 21  Temp: 98.7 F (37.1 C)     TempSrc: Oral     SpO2: 93% (!) 89% 96% 92%  Weight:      Height:        Constitutional: NAD, calm, comfortable Vitals:   12/22/17 2239 12/23/17 0207 12/23/17 0230 12/23/17 0300  BP: (!) 161/70 (!) 169/66 125/62 107/69  Pulse: 94 85 73 77  Resp: 20 (!) 25 (!) 25 (!) 21  Temp: 98.7 F (37.1 C)     TempSrc: Oral     SpO2: 93% (!) 89% 96% 92%  Weight:      Height:       Eyes: lids and conjunctivae normal ENMT: Mucous membranes are moist.  Neck: normal, supple Respiratory: clear to auscultation bilaterally. Normal respiratory effort. No accessory muscle use.  On nasal cannula 1 L. Cardiovascular: Regular rate and rhythm, no murmurs.  Mild bilateral edema. Abdomen: no tenderness, no distention. Bowel sounds positive.  Musculoskeletal:  No joint deformity upper and lower extremities.   Skin: 1.5 cm circular wound over left plantar aspect of foot proximal to MCP joint with no drainage or surrounding erythema. Psychiatric: Normal judgment and insight. Alert and oriented x 3. Normal mood.    Labs on Admission: I have personally reviewed following labs and imaging studies  CBC: Recent Labs  Lab 12/22/17 2329  WBC 10.7*  NEUTROABS 9.3*  HGB 12.0*  HCT 37.3*  MCV 95.4  PLT 226*   Basic Metabolic Panel: Recent Labs  Lab 12/22/17 2329  NA 131*  K 3.9  CL 95*  CO2 22  GLUCOSE 454*  BUN 36*  CREATININE 2.28*  CALCIUM 9.3   GFR: Estimated Creatinine Clearance: 26.8 mL/min (A) (by C-G formula based on SCr of 2.28 mg/dL (H)). Liver Function Tests: Recent Labs  Lab 12/22/17 2329  AST 16  ALT 12*  ALKPHOS 70  BILITOT 0.8  PROT 7.8  ALBUMIN 3.7   Recent Labs  Lab 12/22/17 2329  LIPASE 21   No results for input(s): AMMONIA in the last 168 hours. Coagulation Profile: No results for input(s): INR, PROTIME in the last 168 hours. Cardiac Enzymes: No results for input(s): CKTOTAL,  CKMB, CKMBINDEX, TROPONINI in the last 168 hours. BNP (last 3 results) No results for input(s): PROBNP in the last 8760 hours. HbA1C: No results for input(s): HGBA1C in the last 72 hours. CBG: Recent Labs  Lab 12/22/17 2240 12/23/17 0326  GLUCAP 354* 424*   Lipid Profile: No results for input(s): CHOL, HDL, LDLCALC, TRIG, CHOLHDL, LDLDIRECT in the last 72 hours. Thyroid Function Tests: No results for input(s): TSH, T4TOTAL, FREET4, T3FREE, THYROIDAB in the last 72 hours. Anemia Panel: No results for input(s): VITAMINB12, FOLATE, FERRITIN, TIBC, IRON, RETICCTPCT in the last 72 hours. Urine analysis:    Component Value Date/Time  COLORURINE STRAW (A) 12/23/2017 0331   APPEARANCEUR CLEAR 12/23/2017 0331   LABSPEC 1.017 12/23/2017 0331   PHURINE 7.0 12/23/2017 0331   GLUCOSEU >=500 (A) 12/23/2017 0331   HGBUR SMALL (A) 12/23/2017 0331   BILIRUBINUR NEGATIVE 12/23/2017 0331   KETONESUR NEGATIVE 12/23/2017 0331   PROTEINUR >=300 (A) 12/23/2017 0331   UROBILINOGEN 1.0 05/12/2009 1354   NITRITE NEGATIVE 12/23/2017 0331   LEUKOCYTESUR NEGATIVE 12/23/2017 0331    Radiological Exams on Admission: Ct Abdomen Pelvis Wo Contrast  Result Date: 12/23/2017 CLINICAL DATA:  Nausea and vomiting.  Right upper abdominal pain. EXAM: CT ABDOMEN AND PELVIS WITHOUT CONTRAST TECHNIQUE: Multidetector CT imaging of the abdomen and pelvis was performed following the standard protocol without IV contrast. COMPARISON:  CT and abdominal ultrasound 06/12/2015 FINDINGS: Lower chest: Subpleural reticulation and scarring in the lung bases, with slight improvement from prior. Coronary artery calcifications. Mitral annulus calcifications. Hepatobiliary: No focal hepatic lesion allowing for lack contrast. Mild gallbladder distention appears similar to prior exam. No calcified stone or pericholecystic inflammation. No biliary dilatation. Pancreas: Fatty atrophy.  No ductal dilatation or inflammation. Spleen: Normal in  size without focal abnormality. Adrenals/Urinary Tract: Mild left adrenal thickening without dominant nodule. Normal right adrenal gland. Thinning of bilateral renal parenchyma with symmetric perinephric edema, similar to prior exam. Bilateral ureteral prominence without frank hydronephrosis. No urolithiasis. Urinary bladder is distended to the umbilicus. No definite bladder wall thickening. Stomach/Bowel: Small hiatal hernia. Stomach physiologically distended. No evidence of bowel inflammation, obstruction or wall thickening. Normal appendix. Moderate colonic stool burden. Vascular/Lymphatic: Advanced aortic and branch atherosclerosis. Prominent periportal nodes. Slightly prominent left external iliac node measures 7 mm. Reproductive: Prostate is unremarkable. Other: Minimal fat in the inguinal canals. Small fat containing umbilical hernia. No ascites or free air. No intra-abdominal abscess. Musculoskeletal: Multilevel degenerative change throughout spine. There are no acute or suspicious osseous abnormalities. IMPRESSION: 1. Mild gallbladder distention without calcified stone or pericholecystic inflammation. This appears similar to prior exam. Consider right upper quadrant ultrasound for further evaluation. 2. Distended urinary bladder without wall thickening. Bilateral ureteral prominence likely secondary to bladder distention. 3.  Aortic Atherosclerosis (ICD10-I70.0). Electronically Signed   By: Jeb Levering M.D.   On: 12/23/2017 03:21   Dg Chest 2 View  Result Date: 12/23/2017 CLINICAL DATA:  Dyspnea x1 month with nausea and vomiting x2 today. EXAM: CHEST  2 VIEW COMPARISON:  02/22/2016 FINDINGS: Cardiomegaly with post CABG change. Fine interstitial and reticular lung markings bilaterally may reflect areas of minimal fibrosis. No pneumonic consolidation, effusion or pneumothorax. Undersurface spurring about the right AC joint consistent with osteoarthritis. No acute osseous abnormality. IMPRESSION:  Stable cardiomegaly with fine reticular interstitial lung markings that may reflect areas of interstitial lung disease/fibrosis. Electronically Signed   By: Ashley Royalty M.D.   On: 12/23/2017 00:16   Dg Foot Complete Left  Result Date: 12/23/2017 CLINICAL DATA:  Foot pain with associated plantar ulcer. EXAM: LEFT FOOT - COMPLETE 3+ VIEW COMPARISON:  None. FINDINGS: There is no evidence of fracture. Periarticular sclerosis at the first metatarsophalangeal joint without definite erosions. Possible erosions at the fifth metatarsophalangeal joint plantar soft tissue swelling. Vascular calcifications are noted. IMPRESSION: Periarticular sclerosis at the first metatarsophalangeal joint. This may represent arthritic changes or chronic osteomyelitis. Possible erosions at the fifth metatarsophalangeal joint, which may be seen with acute osteomyelitis. Electronically Signed   By: Fidela Salisbury M.D.   On: 12/23/2017 00:20    EKG: Independently reviewed. SR  Assessment/Plan Principal Problem:  Osteomyelitis due to type 2 diabetes mellitus (HCC) Active Problems:   Hyperlipidemia   Essential hypertension   Atrial fibrillation (HCC)   Chronic diastolic CHF (congestive heart failure) (HCC)   DM type 2 causing vascular disease (HCC)   Other specified hypothyroidism   Nausea and vomiting    Left foot pain suspect secondary to left first MTP acute osteomyelitis Continue IV vancomycin and Zosyn Left foot MRI ESR, CRP Wound care  Intractable nausea and vomiting with possible acute cholecystitis versus gastroenteritis Abdominal ultrasound Clear liquid diet and advance as tolerated  Chronic dyspnea with edema likely secondary to diastolic CHF Check 2D echocardiogram Prior 2D echocardiogram on 02/2016 with findings of LVH and EF 60-65% Hold home Lasix for now Hold IVF  CKD stage IV  At baseline Monitor I/Os and daily weights  Hypothyroidism Continue home levothyroxine  Type 2 diabetes with  hyperglycemia Continue home insulin regimen Resistance sliding scale insulin A1c  Mild hyponatremia -Likely related to dehydration and hyperglycemia Continue IV fluid SSI  Hypertension Continue amlodipine, lisinopril, and metoprolol  Atrial fibrillation-currently in sinus rhythm Continue metoprolol and monitor rate On ASA 66m daily  DVT prophylaxis: Lovenox renally dosed Code Status: Full Family Communication: Daughter at bedside Disposition Plan:Home Consults called:None Admission status: Obs, med-surg   Aurore Redinger DDarleen CrockerDO [facility] Pager [phone] If 7PM-7AM, please contact night-coverage [email or web address] Password [email or web address]  12/23/2017, 4:21 AM

## 2017-12-23 NOTE — Progress Notes (Signed)
*  PRELIMINARY RESULTS* Echocardiogram 2D Echocardiogram with definity has been performed.  Leavy Cella 12/23/2017, 1:36 PM

## 2017-12-23 NOTE — Care Management Note (Signed)
Case Management Note  Patient Details  Name: [name] MRN: [id number] Date of Birth: [birth year]  Subjective/Objective:      Pt admitted from home with wound to foot ? For osteomylitis. Pt lives alone and has strong family support, daughter at bedside. He is ind with ADL's, he drives, prepares his own meals, has no HH or DME needs pta. He has PCP and no difficulty obtaining his medications. There has been some concern from nursing about pt not knowing specifics about his insulin regimen. Pt voices to this CM that he manages well and is complaint with medications. Daughter at bedside does not disagree.             Action/Plan: Anticipate DC home with self care. CM will cont to follow to ensure [facility] needs do not arise.   Expected Discharge Date:      12/24/2016            Expected Discharge Plan:  Home/Self Care  In-House Referral:  NA  Discharge planning Services  CM Consult  Post Acute Care Choice:  NA Choice offered to:  NA  Status of Service:  In process, will continue to follow  Sherald Barge, RN 12/23/2017, 11:41 AM

## 2017-12-23 NOTE — ED Provider Notes (Signed)
[facility] EMERGENCY DEPARTMENT Provider Note   CSN: [id number] Arrival date & time: 12/22/17  2224     History   Chief Complaint Chief Complaint  Patient presents with  . Shortness of Breath    HPI [name] is a 83 y.o. male.  HPI  This is an 83 year old male with a history of atrial fibrillation, coronary artery disease, diabetes who presents with left foot wound and vomiting.  Patient reports that he was going this morning to wound clinic.  He has had a wound on his foot since after Thanksgiving.  However, he developed nonbloody, nonbilious emesis and told his ride to turn around.  Throughout the day he reports that he has had difficulty keeping anything down.  Denies any significant abdominal pain.  Denies any constipation or diarrhea.  Patient denies fevers.  He has chronic shortness of breath and lower extremity swelling.  Denies any chest pain.  He has not been evaluated for this wound on his foot.  Reports that his blood sugars are normally in the 300s.  He states "I just do not feel good."  Past Medical History:  Diagnosis Date  . Afib ([place])   . Coronary artery disease 2010   Status post coronary artery bypass grafting  . Diabetes mellitus   . Hyperlipidemia   . Hypertension   . Renal insufficiency     Patient Active Problem List   Diagnosis Date Noted  . DM type 2 causing vascular disease ([place]) 05/07/2016  . Essential hypertension, benign 05/07/2016  . Other specified hypothyroidism 05/07/2016  . Bilateral carotid bruits 03/11/2016  . Elevated troponin   . Chronic diastolic CHF (congestive heart failure) ([place])   . Bacteremia 02/24/2016  . Sepsis ([place]) 02/22/2016  . Uncontrolled type 2 diabetes mellitus with stage 4 chronic kidney disease ([place]) 02/22/2016  . Demand ischemia ([place]) 02/22/2016  . UTI (urinary tract infection) 02/22/2016  . Acute on chronic renal failure ([place])   . Pyrexia   . Arterial hypotension   . Hypoxia   . UTI (lower urinary tract  infection)   . OBESITY, UNSPECIFIED 08/24/2009  . Hyperlipidemia 08/15/2009  . HYPERTENSION, UNSPECIFIED 08/15/2009  . CAD, ARTERY BYPASS GRAFT 08/15/2009  . Atrial fibrillation ([place]) 08/15/2009    Past Surgical History:  Procedure Laterality Date  . carotid insuff    . CORONARY ARTERY BYPASS GRAFT  2010   LIMA to LAD, SVG to diagonal, SVG to circumflex, marginal, SVG to posterior descending branch.       Home Medications    Prior to Admission medications   Medication Sig Start Date End Date Taking? Authorizing Provider  amLODipine (NORVASC) 5 MG tablet Take 5 mg by mouth daily.    [provider]  aspirin 81 MG tablet Take 81 mg by mouth daily.    [provider]  atorvastatin (LIPITOR) 40 MG tablet Take 1 tablet by mouth at bedtime. 01/30/16   [provider]  furosemide (LASIX) 40 MG tablet Take 1 tablet (40 mg total) by mouth daily. 02/26/16   Geradine Girt, DO  Insulin Aspart (NOVOLOG FLEXPEN [place]) Inject 10-16 Units into the skin 3 (three) times daily with meals.    [provider]  Insulin Glargine (LANTUS SOLOSTAR [place]) Inject 60 Units into the skin at bedtime.    [provider]  levothyroxine (SYNTHROID, LEVOTHROID) 50 MCG tablet Take 50 mcg by mouth daily.    [provider]  lisinopril (PRINIVIL,ZESTRIL) 20 MG tablet TAKE ONE TABLET  BY MOUTH ONCE DAILY FOR BLOOD PRESSURE 01/17/16   [provider]  metoprolol succinate (TOPROL-XL) 25 MG 24 hr tablet Take 0.5 tablets (12.5 mg total) by mouth daily. 02/26/16   Geradine Girt, DO  oxyCODONE-acetaminophen (PERCOCET) 10-325 MG tablet Take 1 tablet by mouth every 6 (six) hours as needed for pain.  02/12/16   [provider]  potassium chloride SA (K-DUR,KLOR-CON) 20 MEQ tablet Take 1 tablet (20 mEq total) by mouth daily. 02/26/16   Geradine Girt, DO    Family History Family History  Problem Relation Age of Onset  . Heart attack Father   . Heart attack  Brother     Social History Social History   Tobacco Use  . Smoking status: Never Smoker  . Smokeless tobacco: Never Used  Substance Use Topics  . Alcohol use: Yes    Alcohol/week: 0.0 oz  . Drug use: Not on file     Allergies   Patient has no known allergies.   Review of Systems Review of Systems  Constitutional: Negative for fever.  Respiratory: Positive for shortness of breath.   Cardiovascular: Positive for leg swelling. Negative for chest pain.  Gastrointestinal: Positive for nausea and vomiting. Negative for abdominal pain, constipation and diarrhea.  Genitourinary: Negative for dysuria.  Skin: Positive for wound.  All other systems reviewed and are negative.    Physical Exam Updated Vital Signs BP 125/62   Pulse 73   Temp 98.7 F (37.1 C) (Oral)   Resp (!) 25   Ht 5\' 5"  (1.651 m)   Wt 90.7 kg (200 lb)   SpO2 96%   BMI 33.28 kg/m   Physical Exam  Constitutional: He is oriented to person, place, and time.  Elderly, overweight, no acute distress  HENT:  Head: Normocephalic and atraumatic.  Eyes: Pupils are equal, round, and reactive to light.  Neck: Neck supple.  Cardiovascular: Normal rate, regular rhythm and normal heart sounds.  No murmur heard. Pulmonary/Chest: Effort normal and breath sounds normal. No respiratory distress. He has no wheezes.  Abdominal: Soft. Bowel sounds are normal. There is no tenderness. There is no rebound.  Musculoskeletal:       Right lower leg: He exhibits edema.       Left lower leg: He exhibits edema.  1+ bilateral lower extremity edema  Lymphadenopathy:    He has no cervical adenopathy.  Neurological: He is alert and oriented to person, place, and time.  Skin: Skin is warm and dry.  Granulated 1.5 cm circular wound over the left foot over the plantar aspect of the foot just proximal to the MCP joint, no drainage noted, no significant surrounding erythema  Psychiatric: He has a normal mood and affect.  Nursing note  and vitals reviewed.    ED Treatments / Results  Labs (all labs ordered are listed, but only abnormal results are displayed) Labs Reviewed  CBC WITH DIFFERENTIAL/PLATELET - Abnormal; Notable for the following components:      Result Value   WBC 10.7 (*)    RBC 3.91 (*)    Hemoglobin 12.0 (*)    HCT 37.3 (*)    Platelets 119 (*)    Neutro Abs 9.3 (*)    All other components within normal limits  BASIC METABOLIC PANEL - Abnormal; Notable for the following components:   Sodium 131 (*)    Chloride 95 (*)    Glucose, Bld 454 (*)    BUN 36 (*)    Creatinine, Ser  2.28 (*)    GFR calc non Af Amer 25 (*)    GFR calc Af Amer 29 (*)    All other components within normal limits  HEPATIC FUNCTION PANEL - Abnormal; Notable for the following components:   ALT 12 (*)    All other components within normal limits  BRAIN NATRIURETIC PEPTIDE - Abnormal; Notable for the following components:   B Natriuretic Peptide 140.0 (*)    All other components within normal limits  CBG MONITORING, ED - Abnormal; Notable for the following components:   Glucose-Capillary 354 (*)    All other components within normal limits  CBG MONITORING, ED - Abnormal; Notable for the following components:   Glucose-Capillary 424 (*)    All other components within normal limits  CULTURE, BLOOD (ROUTINE X 2)  CULTURE, BLOOD (ROUTINE X 2)  LIPASE, BLOOD  URINALYSIS, ROUTINE W REFLEX MICROSCOPIC  URINALYSIS, ROUTINE W REFLEX MICROSCOPIC    EKG  EKG Interpretation  Date/Time:  Wednesday December 23 2017 01:54:51 EST Ventricular Rate:  83 PR Interval:    QRS Duration: 116 QT Interval:  391 QTC Calculation: 460 R Axis:   40 Text Interpretation:  Sinus rhythm Borderline prolonged PR interval Probable left atrial enlargement Nonspecific intraventricular conduction delay Inferolateral infarct, old Confirmed by Thayer Jew [phone]) on 12/23/2017 2:36:00 AM       Radiology Ct Abdomen Pelvis Wo Contrast  Result  Date: 12/23/2017 CLINICAL DATA:  Nausea and vomiting.  Right upper abdominal pain. EXAM: CT ABDOMEN AND PELVIS WITHOUT CONTRAST TECHNIQUE: Multidetector CT imaging of the abdomen and pelvis was performed following the standard protocol without IV contrast. COMPARISON:  CT and abdominal ultrasound 06/12/2015 FINDINGS: Lower chest: Subpleural reticulation and scarring in the lung bases, with slight improvement from prior. Coronary artery calcifications. Mitral annulus calcifications. Hepatobiliary: No focal hepatic lesion allowing for lack contrast. Mild gallbladder distention appears similar to prior exam. No calcified stone or pericholecystic inflammation. No biliary dilatation. Pancreas: Fatty atrophy.  No ductal dilatation or inflammation. Spleen: Normal in size without focal abnormality. Adrenals/Urinary Tract: Mild left adrenal thickening without dominant nodule. Normal right adrenal gland. Thinning of bilateral renal parenchyma with symmetric perinephric edema, similar to prior exam. Bilateral ureteral prominence without frank hydronephrosis. No urolithiasis. Urinary bladder is distended to the umbilicus. No definite bladder wall thickening. Stomach/Bowel: Small hiatal hernia. Stomach physiologically distended. No evidence of bowel inflammation, obstruction or wall thickening. Normal appendix. Moderate colonic stool burden. Vascular/Lymphatic: Advanced aortic and branch atherosclerosis. Prominent periportal nodes. Slightly prominent left external iliac node measures 7 mm. Reproductive: Prostate is unremarkable. Other: Minimal fat in the inguinal canals. Small fat containing umbilical hernia. No ascites or free air. No intra-abdominal abscess. Musculoskeletal: Multilevel degenerative change throughout spine. There are no acute or suspicious osseous abnormalities. IMPRESSION: 1. Mild gallbladder distention without calcified stone or pericholecystic inflammation. This appears similar to prior exam. Consider right  upper quadrant ultrasound for further evaluation. 2. Distended urinary bladder without wall thickening. Bilateral ureteral prominence likely secondary to bladder distention. 3.  Aortic Atherosclerosis (ICD10-I70.0). Electronically Signed   By: Jeb Levering M.D.   On: 12/23/2017 03:21   Dg Chest 2 View  Result Date: 12/23/2017 CLINICAL DATA:  Dyspnea x1 month with nausea and vomiting x2 today. EXAM: CHEST  2 VIEW COMPARISON:  02/22/2016 FINDINGS: Cardiomegaly with post CABG change. Fine interstitial and reticular lung markings bilaterally may reflect areas of minimal fibrosis. No pneumonic consolidation, effusion or pneumothorax. Undersurface spurring about the right [facility] joint consistent with  osteoarthritis. No acute osseous abnormality. IMPRESSION: Stable cardiomegaly with fine reticular interstitial lung markings that may reflect areas of interstitial lung disease/fibrosis. Electronically Signed   By: Ashley Royalty M.D.   On: 12/23/2017 00:16   Dg Foot Complete Left  Result Date: 12/23/2017 CLINICAL DATA:  Foot pain with associated plantar ulcer. EXAM: LEFT FOOT - COMPLETE 3+ VIEW COMPARISON:  None. FINDINGS: There is no evidence of fracture. Periarticular sclerosis at the first metatarsophalangeal joint without definite erosions. Possible erosions at the fifth metatarsophalangeal joint plantar soft tissue swelling. Vascular calcifications are noted. IMPRESSION: Periarticular sclerosis at the first metatarsophalangeal joint. This may represent arthritic changes or chronic osteomyelitis. Possible erosions at the fifth metatarsophalangeal joint, which may be seen with acute osteomyelitis. Electronically Signed   By: Fidela Salisbury M.D.   On: 12/23/2017 00:20    Procedures Procedures (including critical care time)  Medications Ordered in ED Medications  vancomycin (VANCOCIN) IVPB 1000 mg/200 mL premix (not administered)  piperacillin-tazobactam (ZOSYN) IVPB 3.375 g (3.375 g Intravenous New  Bag/Given 12/23/17 0328)  sodium chloride 0.9 % bolus 500 mL (0 mLs Intravenous Stopped 12/23/17 0320)  insulin glargine (LANTUS) injection 40 Units (40 Units Subcutaneous Given 12/23/17 0215)  ondansetron (ZOFRAN) injection 4 mg (4 mg Intravenous Given 12/23/17 0202)  insulin aspart (novoLOG) injection 10 Units (10 Units Subcutaneous Given 12/23/17 0327)     Initial Impression / Assessment and Plan / ED Course  I have reviewed the triage vital signs and the nursing notes.  Pertinent labs & imaging results that were available during my care of the patient were reviewed by me and considered in my medical decision making (see chart for details).     Patient presents with several complaints.  Primarily vomiting and wound over the left lower extremity.  He is overall nontoxic appearing.  Mildly volume overloaded.  Wound on his foot does not appear infected however x-ray does show erosion of the bone corresponding to the wound concerning for osteo-.  Patient was covered with vancomycin and Zosyn.  Mild leukocytosis.  Of note, patient also notably hyperglycemic.  No evidence of anion gap or DKA.  Patient was given 500 cc of fluid and his nightly Lantus.  He had persistent hyperglycemia.  He was subsequently given insulin.  Will admit patient for glucose control and further investigation for osteo of the left foot.  Final Clinical Impressions(s) / ED Diagnoses   Final diagnoses:  Other osteomyelitis of left foot [facility])  Hyperglycemia    ED Discharge Orders    None       [name], MD 12/23/17 [phone]

## 2017-12-23 NOTE — Progress Notes (Signed)
Pharmacy Antibiotic Note  MCDANIEL OHMS is a 83 y.o. male admitted on 12/23/2017 with wound infection / osteomyelitis.  Pharmacy has been consulted for Vancomycin and Zosyn dosing.  Plan:  Vancomycin 1000mg  IV q24h (renally adjusted) Check trough at steady state Zosyn 3.375gm IV q8h, EID Monitor labs, renal fxn, progress and c/s Deescalate / Narrow ABX when improved / appropriate.    Height: 5\' 5"  (165.1 cm) Weight: 197 lb 12 oz (89.7 kg) IBW/kg (Calculated) : 61.5  Temp (24hrs), Avg:98.6 F (37 C), Min:98.4 F (36.9 C), Max:98.7 F (37.1 C)  Recent Labs  Lab 12/22/17 2329 12/23/17 0905 12/23/17 0908  WBC 10.7* 7.1  --   CREATININE 2.28*  --  2.19*    Estimated Creatinine Clearance: 27.7 mL/min (A) (by C-G formula based on SCr of 2.19 mg/dL (H)).    No Known Allergies  Antimicrobials this admission: Vancomycin 1/8 >>  Zosyn 1/8 >>   Dose adjustments this admission:  Microbiology results:  BCx: pending  Thank you for allowing pharmacy to be a part of this patient's care.  Hart Robinsons A 12/23/2017 10:49 AM

## 2017-12-24 ENCOUNTER — Other Ambulatory Visit: Payer: Self-pay

## 2017-12-24 DIAGNOSIS — L03116 Cellulitis of left lower limb: Secondary | ICD-10-CM

## 2017-12-24 DIAGNOSIS — I5032 Chronic diastolic (congestive) heart failure: Secondary | ICD-10-CM | POA: Diagnosis not present

## 2017-12-24 DIAGNOSIS — E11621 Type 2 diabetes mellitus with foot ulcer: Secondary | ICD-10-CM | POA: Diagnosis not present

## 2017-12-24 DIAGNOSIS — L97429 Non-pressure chronic ulcer of left heel and midfoot with unspecified severity: Secondary | ICD-10-CM | POA: Diagnosis not present

## 2017-12-24 DIAGNOSIS — E1159 Type 2 diabetes mellitus with other circulatory complications: Secondary | ICD-10-CM | POA: Diagnosis not present

## 2017-12-24 DIAGNOSIS — R112 Nausea with vomiting, unspecified: Secondary | ICD-10-CM

## 2017-12-24 DIAGNOSIS — L0390 Cellulitis, unspecified: Secondary | ICD-10-CM | POA: Diagnosis present

## 2017-12-24 DIAGNOSIS — I10 Essential (primary) hypertension: Secondary | ICD-10-CM

## 2017-12-24 LAB — BLOOD CULTURE ID PANEL (REFLEXED)
Acinetobacter baumannii: NOT DETECTED
CANDIDA GLABRATA: NOT DETECTED
CANDIDA KRUSEI: NOT DETECTED
CANDIDA TROPICALIS: NOT DETECTED
Candida albicans: NOT DETECTED
Candida parapsilosis: NOT DETECTED
ENTEROBACTER CLOACAE COMPLEX: NOT DETECTED
ESCHERICHIA COLI: NOT DETECTED
Enterobacteriaceae species: NOT DETECTED
Enterococcus species: NOT DETECTED
Haemophilus influenzae: NOT DETECTED
KLEBSIELLA OXYTOCA: NOT DETECTED
KLEBSIELLA PNEUMONIAE: NOT DETECTED
Listeria monocytogenes: NOT DETECTED
Methicillin resistance: NOT DETECTED
Neisseria meningitidis: NOT DETECTED
PROTEUS SPECIES: NOT DETECTED
Pseudomonas aeruginosa: NOT DETECTED
STAPHYLOCOCCUS SPECIES: DETECTED — AB
STREPTOCOCCUS AGALACTIAE: NOT DETECTED
Serratia marcescens: NOT DETECTED
Staphylococcus aureus (BCID): NOT DETECTED
Streptococcus pneumoniae: NOT DETECTED
Streptococcus pyogenes: NOT DETECTED
Streptococcus species: NOT DETECTED

## 2017-12-24 LAB — GLUCOSE, CAPILLARY
GLUCOSE-CAPILLARY: 154 mg/dL — AB (ref 65–99)
GLUCOSE-CAPILLARY: 155 mg/dL — AB (ref 65–99)
Glucose-Capillary: 68 mg/dL (ref 65–99)

## 2017-12-24 LAB — BASIC METABOLIC PANEL
Anion gap: 9 (ref 5–15)
BUN: 39 mg/dL — ABNORMAL HIGH (ref 6–20)
CO2: 25 mmol/L (ref 22–32)
CREATININE: 2.81 mg/dL — AB (ref 0.61–1.24)
Calcium: 8.7 mg/dL — ABNORMAL LOW (ref 8.9–10.3)
Chloride: 99 mmol/L — ABNORMAL LOW (ref 101–111)
GFR calc non Af Amer: 20 mL/min — ABNORMAL LOW (ref >60)
GFR, EST AFRICAN AMERICAN: 23 mL/min — AB (ref >60)
Glucose, Bld: 94 mg/dL (ref 65–99)
POTASSIUM: 3.8 mmol/L (ref 3.5–5.1)
SODIUM: 133 mmol/L — AB (ref 135–145)

## 2017-12-24 LAB — PROTIME-INR
INR: 1.11
PROTHROMBIN TIME: 14.2 s (ref 11.4–15.2)

## 2017-12-24 LAB — CBC
HCT: 31.2 % — ABNORMAL LOW (ref 39.0–52.0)
Hemoglobin: 9.9 g/dL — ABNORMAL LOW (ref 13.0–17.0)
MCH: 30.6 pg (ref 26.0–34.0)
MCHC: 31.7 g/dL (ref 30.0–36.0)
MCV: 96.3 fL (ref 78.0–100.0)
Platelets: 119 10*3/uL — ABNORMAL LOW (ref 150–400)
RBC: 3.24 MIL/uL — AB (ref 4.22–5.81)
RDW: 12.7 % (ref 11.5–15.5)
WBC: 6.6 10*3/uL (ref 4.0–10.5)

## 2017-12-24 MED ORDER — FUROSEMIDE 40 MG PO TABS: 40.0000 mg | ORAL_TABLET | Freq: Every day | ORAL | 0 refills | Status: DC

## 2017-12-24 MED ORDER — SODIUM CHLORIDE 0.9 % IV SOLN
300.0000 mg | Freq: Two times a day (BID) | INTRAVENOUS | Status: DC
  Filled 2017-12-24 (×3): qty 300

## 2017-12-24 MED ORDER — METOPROLOL SUCCINATE ER 50 MG PO TB24: 50.0000 mg | ORAL_TABLET | Freq: Every day | ORAL | 0 refills | Status: DC

## 2017-12-24 MED ORDER — CLINDAMYCIN HCL 300 MG PO CAPS: 600.0000 mg | ORAL_CAPSULE | Freq: Three times a day (TID) | ORAL | 0 refills | Status: AC

## 2017-12-24 MED ORDER — LACTINEX PO CHEW: 2.0000 | CHEWABLE_TABLET | Freq: Three times a day (TID) | ORAL | 0 refills | Status: DC

## 2017-12-24 NOTE — Progress Notes (Signed)
PHARMACY - PHYSICIAN COMMUNICATION CRITICAL VALUE ALERT - BLOOD CULTURE IDENTIFICATION (BCID)  [name] is an 83 y.o. male who presented to [facility] on 12/23/2017 with a chief complaint of a wound infection and concerns for osteomyelitis.  Assessment: Pt with cellulitis of left foot and complaints of intractable nausea and vomiting with possible cholecystitis vs gastroenteritis.  Name of physician (or Provider) Contacted: Dr Eleonore Chiquito Current antibiotics: Ceftaroline 400 mg IV q12h (renal adjustment)  Changes to prescribed antibiotics recommended:  Vancomycin initially ordered on admission with change to ceftaroline d/t CKD. Dr. Darrick Meigs reordered vancomycin however ceftaroline should provide coverage for gram positive cocci bacteremia and endocarditis.   No results found for this or any previous visit.  [name] 12/24/2017  6:44 AM

## 2017-12-24 NOTE — Progress Notes (Signed)
Patient is ready to advance diet.  Per MD note on January 9, diet is listed as advance as tolerated.  Therefore order placed to advance diet to soft.

## 2017-12-24 NOTE — Progress Notes (Signed)
Inpatient Diabetes Program Recommendations  AACE/ADA: New Consensus Statement on Inpatient Glycemic Control (2015)  Target Ranges:  Prepandial:   less than 140 mg/dL      Peak postprandial:   less than 180 mg/dL (1-2 hours)      Critically ill patients:  140 - 180 mg/dL   Results for RAYCE, BRAHMBHATT (MRN [id number]) as of 12/24/2017 09:29  Ref. Range 12/23/2017 07:36 12/23/2017 11:32 12/23/2017 16:14 12/23/2017 21:00 12/24/2017 07:39  Glucose-Capillary Latest Ref Range: 65 - 99 mg/dL 301 (H) 85 180 (H) 161 (H) 68   Review of Glycemic Control  Diabetes history: DM2 Outpatient Diabetes medications: Lantus 60 units QHS, Novolog 10-16 units TID with meals Current orders for Inpatient glycemic control:  Lantus 60 units QHS, Novolog 10-16 units TID with meals, Novolog 0-20 units TID with meals, Novolog 0-5 units QHS  Inpatient Diabetes Program Recommendations: Insulin-Basal: Fasting glucose 68 mg/dl this morning. Please consider decreasing Lantus to 50 units QHS. Insulin - Meal Coverage: No meal coverage was given at all yesterday. Please consider discontinuing Novolog 10-16 units TID with meals (range of meal coverage without any noted parameters). Then if post prandial glucose is consistently elevated, MD could consider adding back Novolog meal coverage.  Thanks, Barnie Alderman, RN, MSN, CDE Diabetes Coordinator Inpatient Diabetes Program [phone] (Team Pager from 8am to 5pm)

## 2017-12-24 NOTE — Progress Notes (Signed)
PHARMACY NOTE:  ANTIMICROBIAL RENAL DOSAGE ADJUSTMENT  Current antimicrobial regimen includes a mismatch between antimicrobial dosage and estimated renal function.  As per policy approved by the Pharmacy & Therapeutics and Medical Executive Committees, the antimicrobial dosage will be adjusted accordingly.  Current antimicrobial dosage:  Teflaro 400mg  IV q12hrs  Indication: bacteremia, wound infection  Renal Function:  SCr is worse today Estimated Creatinine Clearance: 21.9 mL/min (A) (by C-G formula based on SCr of 2.81 mg/dL (H)).    Antimicrobial dosage has been changed to:  Teflaro 300mg  IV q12hrs  Additional comments:  Continue to monitor renal fxn and cultures  Thank you for allowing pharmacy to be a part of this patient's care.  Ena Dawley, [facility] 12/24/2017 7:47 AM

## 2017-12-24 NOTE — Discharge Summary (Signed)
Physician Discharge Summary  MOROCCO GIPE GYJ:[id number] DOB: 12-21-1934 DOA: 12/23/2017  PCP: Deloria Lair., MD  Admit date: 12/23/2017 Discharge date: 12/24/2017  Admitted From: Home Disposition: Home  Recommendations for Outpatient Follow-up:  1. Follow up with PCP in 1-2 weeks 2. Please obtain BMP/CBC in one week 3. Follow-up with wound center next week as previously scheduled 4. Follow-up with vascular surgery in 2 weeks as previously scheduled 5. At patient's request, he was given information regarding scheduling possible podiatry follow-up  Home Health: Equipment/Devices:  Discharge Condition: Stable CODE STATUS: Full code Diet recommendation: Heart Healthy / Carb Modified   Brief/Interim Summary: 83 year old male with a history of hypertension and diabetes, chronic kidney disease, presented to the emergency room with left foot wound.  Initial plain films indicated possible osteomyelitis and he was admitted to the hospital, started on intravenous antibiotics.  MRI of the foot was performed that was negative for osteomyelitis.  It was felt that he likely has a severe cellulitis.  Patient was started on treatment with intravenous vancomycin and Zosyn, this was changed to Teflaro due to renal failure.  He had a wound on plantar aspect of his left forefoot.  This did not appear to have any significant drainage at the time of discharge.  His surrounding cellulitis and wound significantly improved prior to discharge.  He was noted to have 1+ blood culture, but BCID indicated that this was coagulase-negative staph.  Since the patient appeared remarkably well, without any other signs of bacteremia/sepsis, and there was only 1 out of 2 positive cultures, this is considered to be contaminant.  The patient had completed a course of doxycycline prior to admission.  Bactrim would not be an appropriate option due to his renal failure.  He has been transitioned to clindamycin to complete a total of  7 days.  He plans to follow-up with wound care center next week.  He was noted to have a bump in his creatinine.  We will hold his Lasix for the next few days.  He will need repeat labs in the next 1 week to ensure that his renal function is stable.  He did have nausea and vomiting prior to admission which has since resolved.  He was started on clear liquids and advance to solid food, this was tolerated well.  Imaging did not indicate any abnormality in his bowel.  Gallbladder was noted to be full, but ultrasound did not show any evidence of cholelithiasis.  He was nontender on abdominal exam.  Patient is anxious to discharge home today.  Discharge Diagnoses:  Principal Problem:   Cellulitis Active Problems:   Hyperlipidemia   Essential hypertension   Atrial fibrillation (HCC)   Chronic diastolic CHF (congestive heart failure) (HCC)   DM type 2 causing vascular disease ([place])   Other specified hypothyroidism   Nausea and vomiting    Discharge Instructions  Discharge Instructions    Diet - low sodium heart healthy   Complete by:  As directed    Increase activity slowly   Complete by:  As directed      Allergies as of 12/24/2017   No Known Allergies     Medication List    STOP taking these medications   doxycycline 100 MG tablet Commonly known as:  VIBRA-TABS   VITAMIN D3 PO     TAKE these medications   amLODipine 5 MG tablet Commonly known as:  NORVASC Take 5 mg by mouth daily.   aspirin 81 MG tablet Take  81 mg by mouth at bedtime.   atorvastatin 40 MG tablet Commonly known as:  LIPITOR Take 1 tablet by mouth at bedtime.   clindamycin 300 MG capsule Commonly known as:  CLEOCIN Take 2 capsules (600 mg total) by mouth 3 (three) times daily for 5 days.   FISH OIL PO Take 1 capsule by mouth daily.   furosemide 40 MG tablet Commonly known as:  LASIX Take 1 tablet (40 mg total) by mouth daily. Resume on 1/15 What changed:  additional instructions   lactobacillus  acidophilus & bulgar chewable tablet Chew 2 tablets by mouth 3 (three) times daily.   LANTUS SOLOSTAR [place] Inject 40 Units into the skin 2 (two) times daily.   levothyroxine 50 MCG tablet Commonly known as:  SYNTHROID, LEVOTHROID Take 50 mcg by mouth daily.   metoprolol succinate 50 MG 24 hr tablet Commonly known as:  TOPROL-XL Take 1 tablet (50 mg total) by mouth daily. What changed:    medication strength  how much to take   NOVOLOG FLEXPEN [place] Inject 10-15 Units into the skin 3 (three) times daily with meals.   oxyCODONE-acetaminophen 10-325 MG tablet Commonly known as:  PERCOCET Take 1 tablet by mouth every 6 (six) hours as needed for pain.      Follow-up Information    Caprice Beaver, DPM Follow up.   Specialty:  Podiatry Why:  call for appointment  Contact information: [place]  [id number] [phone]        follow up at wound center as previously scheduled Follow up.        Deloria Lair., MD. Schedule an appointment as soon as possible for a visit in 2 week(s).   Specialty:  Family Medicine Contact information: [place] [address] [phone]          No Known Allergies  Consultations:     Procedures/Studies: Ct Abdomen Pelvis Wo Contrast  Result Date: 12/23/2017 CLINICAL DATA:  Nausea and vomiting.  Right upper abdominal pain. EXAM: CT ABDOMEN AND PELVIS WITHOUT CONTRAST TECHNIQUE: Multidetector CT imaging of the abdomen and pelvis was performed following the standard protocol without IV contrast. COMPARISON:  CT and abdominal ultrasound 06/12/2015 FINDINGS: Lower chest: Subpleural reticulation and scarring in the lung bases, with slight improvement from prior. Coronary artery calcifications. Mitral annulus calcifications. Hepatobiliary: No focal hepatic lesion allowing for lack contrast. Mild gallbladder distention appears similar to prior exam. No calcified stone or pericholecystic inflammation. No  biliary dilatation. Pancreas: Fatty atrophy.  No ductal dilatation or inflammation. Spleen: Normal in size without focal abnormality. Adrenals/Urinary Tract: Mild left adrenal thickening without dominant nodule. Normal right adrenal gland. Thinning of bilateral renal parenchyma with symmetric perinephric edema, similar to prior exam. Bilateral ureteral prominence without frank hydronephrosis. No urolithiasis. Urinary bladder is distended to the umbilicus. No definite bladder wall thickening. Stomach/Bowel: Small hiatal hernia. Stomach physiologically distended. No evidence of bowel inflammation, obstruction or wall thickening. Normal appendix. Moderate colonic stool burden. Vascular/Lymphatic: Advanced aortic and branch atherosclerosis. Prominent periportal nodes. Slightly prominent left external iliac node measures 7 mm. Reproductive: Prostate is unremarkable. Other: Minimal fat in the inguinal canals. Small fat containing umbilical hernia. No ascites or free air. No intra-abdominal abscess. Musculoskeletal: Multilevel degenerative change throughout spine. There are no acute or suspicious osseous abnormalities. IMPRESSION: 1. Mild gallbladder distention without calcified stone or pericholecystic inflammation. This appears similar to prior exam. Consider right upper quadrant ultrasound for further evaluation. 2. Distended urinary bladder without  wall thickening. Bilateral ureteral prominence likely secondary to bladder distention. 3.  Aortic Atherosclerosis (ICD10-I70.0). Electronically Signed   By: Jeb Levering M.D.   On: 12/23/2017 03:21   Dg Chest 2 View  Result Date: 12/23/2017 CLINICAL DATA:  Dyspnea x1 month with nausea and vomiting x2 today. EXAM: CHEST  2 VIEW COMPARISON:  02/22/2016 FINDINGS: Cardiomegaly with post CABG change. Fine interstitial and reticular lung markings bilaterally may reflect areas of minimal fibrosis. No pneumonic consolidation, effusion or pneumothorax. Undersurface spurring  about the right AC joint consistent with osteoarthritis. No acute osseous abnormality. IMPRESSION: Stable cardiomegaly with fine reticular interstitial lung markings that may reflect areas of interstitial lung disease/fibrosis. Electronically Signed   By: Ashley Royalty M.D.   On: 12/23/2017 00:16   US Abdomen Complete  Result Date: 12/23/2017 CLINICAL DATA:  Nausea and vomiting. Right upper quadrant pain, 2 days duration. EXAM: ABDOMEN ULTRASOUND COMPLETE COMPARISON:  CT same day FINDINGS: Gallbladder: No gallstones or wall thickening visualized. No sonographic Murphy sign noted by sonographer. Common bile duct: Diameter: 3 mm, normal Liver: No focal lesion identified. Within normal limits in parenchymal echogenicity. Portal vein is patent on color Doppler imaging with normal direction of blood flow towards the liver. IVC: No abnormality visualized. Pancreas: Poorly seen because of overlying bowel gas. Spleen: Size and appearance within normal limits. Right Kidney: Length: 11.0 cm. Echogenicity within normal limits. No mass or hydronephrosis visualized. Left Kidney: Length: 12.9 cm. Echogenicity within normal limits. No mass or hydronephrosis visualized. Abdominal aorta: No aneurysm visualized. Other findings: None. IMPRESSION: Normal examination. The gallbladder is full, but there are no stones and there is no sonographic evidence of cholecystitis. Electronically Signed   By: Nelson Chimes M.D.   On: 12/23/2017 10:51   Mr Foot Left Wo Contrast  Result Date: 12/23/2017 CLINICAL DATA:  Left foot wound along the plantar aspect of the first MTP joint EXAM: MRI OF THE LEFT FOOT WITHOUT CONTRAST TECHNIQUE: Multiplanar, multisequence MR imaging of the left forefoot was performed. No intravenous contrast was administered. COMPARISON:  None. FINDINGS: Patient motion degrades image quality limiting evaluation. Bones/Joint/Cartilage Soft tissue wound along the plantar aspect of the first MTP joint. No cortical destruction  or periosteal reaction. No focal marrow edema. No first MTP joint effusion. No fracture or dislocation. Normal alignment. No joint effusion. Moderate osteoarthritis of the first MTP joint. Ligaments Collateral ligaments are intact.  Lisfranc ligament is intact. Muscles and Tendons Flexor, peroneal and extensor compartment tendons are intact. No muscle atrophy. Soft tissue No fluid collection or hematoma. No soft tissue mass. Severe soft tissue edema along the dorsal aspect of the foot concerning for cellulitis. IMPRESSION: 1. No osteomyelitis of the left forefoot. 2. Severe soft tissue edema along the dorsal aspect of the foot concerning for cellulitis. Electronically Signed   By: Kathreen Devoid   On: 12/23/2017 10:13   Dg Foot Complete Left  Result Date: 12/23/2017 CLINICAL DATA:  Foot pain with associated plantar ulcer. EXAM: LEFT FOOT - COMPLETE 3+ VIEW COMPARISON:  None. FINDINGS: There is no evidence of fracture. Periarticular sclerosis at the first metatarsophalangeal joint without definite erosions. Possible erosions at the fifth metatarsophalangeal joint plantar soft tissue swelling. Vascular calcifications are noted. IMPRESSION: Periarticular sclerosis at the first metatarsophalangeal joint. This may represent arthritic changes or chronic osteomyelitis. Possible erosions at the fifth metatarsophalangeal joint, which may be seen with acute osteomyelitis. Electronically Signed   By: Fidela Salisbury M.D.   On: 12/23/2017 00:20  Subjective: Feeling better.  Denies any pain in his foot.  Discharge Exam: Vitals:   12/23/17 2101 12/24/17 0552  BP: (!) 129/56 (!) 126/57  Pulse: 64 100  Resp: 18 18  Temp: 98.4 F (36.9 C) 98.2 F (36.8 C)  SpO2: 93% 92%   Vitals:   12/23/17 1400 12/23/17 1441 12/23/17 2101 12/24/17 0552  BP: (!) 82/45 90/62 (!) 129/56 (!) 126/57  Pulse: 96  64 100  Resp: 18  18 18   Temp: 98.1 F (36.7 C)  98.4 F (36.9 C) 98.2 F (36.8 C)  TempSrc: Oral  Oral  Oral  SpO2: 93%  93% 92%  Weight:    92.5 kg (203 lb 14.8 oz)  Height:        General: Pt is alert, awake, not in acute distress Cardiovascular: RRR, S1/S2 +, no rubs, no gallops Respiratory: CTA bilaterally, no wheezing, no rhonchi Abdominal: Soft, NT, ND, bowel sounds + Extremities: Wound on base of left foot appears significantly improved when compared to admission.  No surrounding erythema.  Minimal drainage.  Overall swelling is better.    The results of significant diagnostics from this hospitalization (including imaging, microbiology, ancillary and laboratory) are listed below for reference.     Microbiology: Recent Results (from the past 240 hour(s))  Blood culture (routine x 2)     Status: None (Preliminary result)   Collection Time: 12/23/17  2:45 AM  Result Value Ref Range Status   Specimen Description BLOOD LEFT HAND  Final   Special Requests   Final    BOTTLES DRAWN AEROBIC ONLY Blood Culture adequate volume   Culture  Setup Time   Final    GRAM POSITIVE COCCI ANAEROBIC BOTTLE ONLY Gram Stain Report Called to,Read Back By and Verified With: LIGHTNER,N @ [id number] ON 01.10.19 BY BOWMAN,L CRITICAL RESULT CALLED TO, READ BACK BY AND VERIFIED WITH: S HURTH,PHARMD AT 4742 12/24/17 BY L BENFIELD Performed at [facility], [place] [address] [place], [place] [address]    Culture GRAM POSITIVE COCCI  Final   Report Status PENDING  Incomplete  Blood Culture ID Panel (Reflexed)     Status: Abnormal   Collection Time: 12/23/17  2:45 AM  Result Value Ref Range Status   Enterococcus species NOT DETECTED NOT DETECTED Final   Listeria monocytogenes NOT DETECTED NOT DETECTED Final   Staphylococcus species DETECTED (A) NOT DETECTED Final    Comment: Methicillin (oxacillin) susceptible coagulase negative staphylococcus. Possible blood culture contaminant (unless isolated from more than one blood culture draw or clinical case suggests pathogenicity). No antibiotic treatment is indicated  for blood  culture contaminants. CRITICAL RESULT CALLED TO, READ BACK BY AND VERIFIED WITH: S HURTH,PHARMD AT 0928 12/24/17 BY L BENFIELD    Staphylococcus aureus NOT DETECTED NOT DETECTED Final   Methicillin resistance NOT DETECTED NOT DETECTED Final   Streptococcus species NOT DETECTED NOT DETECTED Final   Streptococcus agalactiae NOT DETECTED NOT DETECTED Final   Streptococcus pneumoniae NOT DETECTED NOT DETECTED Final   Streptococcus pyogenes NOT DETECTED NOT DETECTED Final   Acinetobacter baumannii NOT DETECTED NOT DETECTED Final   Enterobacteriaceae species NOT DETECTED NOT DETECTED Final   Enterobacter cloacae complex NOT DETECTED NOT DETECTED Final   Escherichia coli NOT DETECTED NOT DETECTED Final   Klebsiella oxytoca NOT DETECTED NOT DETECTED Final   Klebsiella pneumoniae NOT DETECTED NOT DETECTED Final   Proteus species NOT DETECTED NOT DETECTED Final   Serratia marcescens NOT DETECTED NOT DETECTED Final   Haemophilus influenzae  NOT DETECTED NOT DETECTED Final   Neisseria meningitidis NOT DETECTED NOT DETECTED Final   Pseudomonas aeruginosa NOT DETECTED NOT DETECTED Final   Candida albicans NOT DETECTED NOT DETECTED Final   Candida glabrata NOT DETECTED NOT DETECTED Final   Candida krusei NOT DETECTED NOT DETECTED Final   Candida parapsilosis NOT DETECTED NOT DETECTED Final   Candida tropicalis NOT DETECTED NOT DETECTED Final    Comment: Performed at [facility], [place] [address], [place], [place] [address]  Blood culture (routine x 2)     Status: None (Preliminary result)   Collection Time: 12/23/17  2:51 AM  Result Value Ref Range Status   Specimen Description BLOOD LEFT ARM  Final   Special Requests   Final    BOTTLES DRAWN AEROBIC ONLY Blood Culture results may not be optimal due to an inadequate volume of blood received in culture bottles   Culture NO GROWTH 1 DAY  Final   Report Status PENDING  Incomplete     Labs: BNP (last 3 results) Recent Labs     12/22/17 2329  BNP 564.3*   Basic Metabolic Panel: Recent Labs  Lab 12/22/17 2329 12/23/17 0908 12/24/17 0419  NA 131* 133* 133*  K 3.9 3.3* 3.8  CL 95* 97* 99*  CO2 22 25 25   GLUCOSE 454* 233* 94  BUN 36* 33* 39*  CREATININE 2.28* 2.19* 2.81*  CALCIUM 9.3 8.8* 8.7*   Liver Function Tests: Recent Labs  Lab 12/22/17 2329 12/23/17 0908  AST 16 15  ALT 12* 11*  ALKPHOS 70 59  BILITOT 0.8 0.8  PROT 7.8 6.4*  ALBUMIN 3.7 3.0*   Recent Labs  Lab 12/22/17 2329  LIPASE 21   No results for input(s): AMMONIA in the last 168 hours. CBC: Recent Labs  Lab 12/22/17 2329 12/23/17 0905 12/24/17 0419  WBC 10.7* 7.1 6.6  NEUTROABS 9.3* 5.4  --   HGB 12.0* 10.6* 9.9*  HCT 37.3* 32.9* 31.2*  MCV 95.4 95.6 96.3  PLT 119* 111* 119*   Cardiac Enzymes: No results for input(s): CKTOTAL, CKMB, CKMBINDEX, TROPONINI in the last 168 hours. BNP: Invalid input(s): POCBNP CBG: Recent Labs  Lab 12/23/17 1614 12/23/17 2100 12/24/17 0739 12/24/17 1139 12/24/17 1643  GLUCAP 180* 161* 68 154* 155*   D-Dimer No results for input(s): DDIMER in the last 72 hours. Hgb A1c Recent Labs    12/23/17 0521  HGBA1C 10.4*   Lipid Profile No results for input(s): CHOL, HDL, LDLCALC, TRIG, CHOLHDL, LDLDIRECT in the last 72 hours. Thyroid function studies No results for input(s): TSH, T4TOTAL, T3FREE, THYROIDAB in the last 72 hours.  Invalid input(s): FREET3 Anemia work up No results for input(s): VITAMINB12, FOLATE, FERRITIN, TIBC, IRON, RETICCTPCT in the last 72 hours. Urinalysis    Component Value Date/Time   COLORURINE STRAW (A) 12/23/2017 0331   APPEARANCEUR CLEAR 12/23/2017 0331   LABSPEC 1.017 12/23/2017 0331   PHURINE 7.0 12/23/2017 0331   GLUCOSEU >=500 (A) 12/23/2017 0331   HGBUR SMALL (A) 12/23/2017 0331   BILIRUBINUR NEGATIVE 12/23/2017 0331   KETONESUR NEGATIVE 12/23/2017 0331   PROTEINUR >=300 (A) 12/23/2017 0331   UROBILINOGEN 1.0 05/12/2009 1354   NITRITE  NEGATIVE 12/23/2017 0331   LEUKOCYTESUR NEGATIVE 12/23/2017 0331   Sepsis Labs Invalid input(s): PROCALCITONIN,  WBC,  LACTICIDVEN Microbiology Recent Results (from the past 240 hour(s))  Blood culture (routine x 2)     Status: None (Preliminary result)   Collection Time: 12/23/17  2:45 AM  Result  Value Ref Range Status   Specimen Description BLOOD LEFT HAND  Final   Special Requests   Final    BOTTLES DRAWN AEROBIC ONLY Blood Culture adequate volume   Culture  Setup Time   Final    GRAM POSITIVE COCCI ANAEROBIC BOTTLE ONLY Gram Stain Report Called to,Read Back By and Verified With: LIGHTNER,N @ [id number] ON 01.10.19 BY BOWMAN,L CRITICAL RESULT CALLED TO, READ BACK BY AND VERIFIED WITH: S HURTH,PHARMD AT 6553 12/24/17 BY L BENFIELD Performed at [facility], [place] [address], [place], [place] [address]    Culture GRAM POSITIVE COCCI  Final   Report Status PENDING  Incomplete  Blood Culture ID Panel (Reflexed)     Status: Abnormal   Collection Time: 12/23/17  2:45 AM  Result Value Ref Range Status   Enterococcus species NOT DETECTED NOT DETECTED Final   Listeria monocytogenes NOT DETECTED NOT DETECTED Final   Staphylococcus species DETECTED (A) NOT DETECTED Final    Comment: Methicillin (oxacillin) susceptible coagulase negative staphylococcus. Possible blood culture contaminant (unless isolated from more than one blood culture draw or clinical case suggests pathogenicity). No antibiotic treatment is indicated for blood  culture contaminants. CRITICAL RESULT CALLED TO, READ BACK BY AND VERIFIED WITH: S HURTH,PHARMD AT 0928 12/24/17 BY L BENFIELD    Staphylococcus aureus NOT DETECTED NOT DETECTED Final   Methicillin resistance NOT DETECTED NOT DETECTED Final   Streptococcus species NOT DETECTED NOT DETECTED Final   Streptococcus agalactiae NOT DETECTED NOT DETECTED Final   Streptococcus pneumoniae NOT DETECTED NOT DETECTED Final   Streptococcus pyogenes NOT DETECTED NOT DETECTED Final    Acinetobacter baumannii NOT DETECTED NOT DETECTED Final   Enterobacteriaceae species NOT DETECTED NOT DETECTED Final   Enterobacter cloacae complex NOT DETECTED NOT DETECTED Final   Escherichia coli NOT DETECTED NOT DETECTED Final   Klebsiella oxytoca NOT DETECTED NOT DETECTED Final   Klebsiella pneumoniae NOT DETECTED NOT DETECTED Final   Proteus species NOT DETECTED NOT DETECTED Final   Serratia marcescens NOT DETECTED NOT DETECTED Final   Haemophilus influenzae NOT DETECTED NOT DETECTED Final   Neisseria meningitidis NOT DETECTED NOT DETECTED Final   Pseudomonas aeruginosa NOT DETECTED NOT DETECTED Final   Candida albicans NOT DETECTED NOT DETECTED Final   Candida glabrata NOT DETECTED NOT DETECTED Final   Candida krusei NOT DETECTED NOT DETECTED Final   Candida parapsilosis NOT DETECTED NOT DETECTED Final   Candida tropicalis NOT DETECTED NOT DETECTED Final    Comment: Performed at [facility], [place]. [address], [place],  [address]  Blood culture (routine x 2)     Status: None (Preliminary result)   Collection Time: 12/23/17  2:51 AM  Result Value Ref Range Status   Specimen Description BLOOD LEFT ARM  Final   Special Requests   Final    BOTTLES DRAWN AEROBIC ONLY Blood Culture results may not be optimal due to an inadequate volume of blood received in culture bottles   Culture NO GROWTH 1 DAY  Final   Report Status PENDING  Incomplete     Time coordinating discharge: Over 30 minutes  SIGNED:   Kathie Dike, MD  [facility] 12/24/2017, 6:12 PM Pager   If 7PM-7AM, please contact night-coverage [email or web address] Password [email or web address]

## 2017-12-24 NOTE — Progress Notes (Signed)
Patient declined wound care at this time. Patient states he does not feel that drsg. change is needed. Dressing was applied last night and is C.D.I.

## 2017-12-24 NOTE — Progress Notes (Signed)
CRITICAL VALUE ALERT  Critical Value:  BC anaerobic bottle: Gram + Cocci  Date & Time Notied:  12/24/17 0555  Provider Notified: Darrick Meigs, MD  Orders Received/Actions taken: Vancomycin per pharmacy protocol.

## 2017-12-28 LAB — CULTURE, BLOOD (ROUTINE X 2)
CULTURE: NO GROWTH
Special Requests: ADEQUATE

## 2018-01-08 ENCOUNTER — Emergency Department (HOSPITAL_COMMUNITY): Payer: Medicare Other

## 2018-01-08 ENCOUNTER — Ambulatory Visit (INDEPENDENT_AMBULATORY_CARE_PROVIDER_SITE_OTHER): Payer: Medicare HMO | Admitting: Vascular Surgery

## 2018-01-08 ENCOUNTER — Encounter (HOSPITAL_COMMUNITY): Payer: Self-pay

## 2018-01-08 ENCOUNTER — Inpatient Hospital Stay (HOSPITAL_COMMUNITY)
Admission: EM | Admit: 2018-01-08 | Discharge: 2018-01-10 | DRG: 291 | Disposition: A | Discharge status: Home / Self Care | Payer: Medicare Other | Attending: Family Medicine | Admitting: Family Medicine

## 2018-01-08 ENCOUNTER — Encounter: Payer: Self-pay | Admitting: Vascular Surgery

## 2018-01-08 ENCOUNTER — Other Ambulatory Visit: Payer: Self-pay

## 2018-01-08 VITALS — BP 171/59 | HR 48 | Temp 97.6°F | Resp 16 | Ht 65.0 in | Wt 208.0 lb

## 2018-01-08 DIAGNOSIS — I6523 Occlusion and stenosis of bilateral carotid arteries: Secondary | ICD-10-CM

## 2018-01-08 DIAGNOSIS — E1122 Type 2 diabetes mellitus with diabetic chronic kidney disease: Secondary | ICD-10-CM | POA: Diagnosis not present

## 2018-01-08 DIAGNOSIS — Z79899 Other long term (current) drug therapy: Secondary | ICD-10-CM | POA: Diagnosis not present

## 2018-01-08 DIAGNOSIS — E669 Obesity, unspecified: Secondary | ICD-10-CM | POA: Diagnosis present

## 2018-01-08 DIAGNOSIS — I739 Peripheral vascular disease, unspecified: Secondary | ICD-10-CM

## 2018-01-08 DIAGNOSIS — I5033 Acute on chronic diastolic (congestive) heart failure: Secondary | ICD-10-CM | POA: Diagnosis not present

## 2018-01-08 DIAGNOSIS — L97529 Non-pressure chronic ulcer of other part of left foot with unspecified severity: Secondary | ICD-10-CM | POA: Diagnosis present

## 2018-01-08 DIAGNOSIS — J9601 Acute respiratory failure with hypoxia: Secondary | ICD-10-CM | POA: Diagnosis not present

## 2018-01-08 DIAGNOSIS — Z6832 Body mass index (BMI) 32.0-32.9, adult: Secondary | ICD-10-CM

## 2018-01-08 DIAGNOSIS — N179 Acute kidney failure, unspecified: Secondary | ICD-10-CM | POA: Diagnosis present

## 2018-01-08 DIAGNOSIS — I7025 Atherosclerosis of native arteries of other extremities with ulceration: Secondary | ICD-10-CM | POA: Diagnosis not present

## 2018-01-08 DIAGNOSIS — E039 Hypothyroidism, unspecified: Secondary | ICD-10-CM | POA: Diagnosis not present

## 2018-01-08 DIAGNOSIS — N184 Chronic kidney disease, stage 4 (severe): Secondary | ICD-10-CM | POA: Diagnosis not present

## 2018-01-08 DIAGNOSIS — R0902 Hypoxemia: Secondary | ICD-10-CM

## 2018-01-08 DIAGNOSIS — E1165 Type 2 diabetes mellitus with hyperglycemia: Secondary | ICD-10-CM | POA: Diagnosis not present

## 2018-01-08 DIAGNOSIS — Z7982 Long term (current) use of aspirin: Secondary | ICD-10-CM

## 2018-01-08 DIAGNOSIS — Z9181 History of falling: Secondary | ICD-10-CM

## 2018-01-08 DIAGNOSIS — E785 Hyperlipidemia, unspecified: Secondary | ICD-10-CM | POA: Diagnosis present

## 2018-01-08 DIAGNOSIS — I509 Heart failure, unspecified: Secondary | ICD-10-CM

## 2018-01-08 DIAGNOSIS — I480 Paroxysmal atrial fibrillation: Secondary | ICD-10-CM | POA: Diagnosis present

## 2018-01-08 DIAGNOSIS — N183 Chronic kidney disease, stage 3 (moderate): Secondary | ICD-10-CM | POA: Diagnosis not present

## 2018-01-08 DIAGNOSIS — I2510 Atherosclerotic heart disease of native coronary artery without angina pectoris: Secondary | ICD-10-CM | POA: Diagnosis not present

## 2018-01-08 DIAGNOSIS — I10 Essential (primary) hypertension: Secondary | ICD-10-CM | POA: Diagnosis present

## 2018-01-08 DIAGNOSIS — Z794 Long term (current) use of insulin: Secondary | ICD-10-CM

## 2018-01-08 DIAGNOSIS — E11621 Type 2 diabetes mellitus with foot ulcer: Secondary | ICD-10-CM | POA: Diagnosis not present

## 2018-01-08 DIAGNOSIS — Z951 Presence of aortocoronary bypass graft: Secondary | ICD-10-CM | POA: Diagnosis not present

## 2018-01-08 DIAGNOSIS — R001 Bradycardia, unspecified: Secondary | ICD-10-CM | POA: Diagnosis not present

## 2018-01-08 DIAGNOSIS — I248 Other forms of acute ischemic heart disease: Secondary | ICD-10-CM | POA: Diagnosis not present

## 2018-01-08 DIAGNOSIS — E118 Type 2 diabetes mellitus with unspecified complications: Secondary | ICD-10-CM | POA: Diagnosis present

## 2018-01-08 DIAGNOSIS — I25810 Atherosclerosis of coronary artery bypass graft(s) without angina pectoris: Secondary | ICD-10-CM | POA: Diagnosis present

## 2018-01-08 DIAGNOSIS — I4891 Unspecified atrial fibrillation: Secondary | ICD-10-CM | POA: Diagnosis present

## 2018-01-08 DIAGNOSIS — Z79891 Long term (current) use of opiate analgesic: Secondary | ICD-10-CM

## 2018-01-08 DIAGNOSIS — I130 Hypertensive heart and chronic kidney disease with heart failure and stage 1 through stage 4 chronic kidney disease, or unspecified chronic kidney disease: Principal | ICD-10-CM | POA: Diagnosis present

## 2018-01-08 DIAGNOSIS — Z79890 Hormone replacement therapy (postmenopausal): Secondary | ICD-10-CM

## 2018-01-08 LAB — BASIC METABOLIC PANEL
ANION GAP: 10 (ref 5–15)
BUN: 41 mg/dL — ABNORMAL HIGH (ref 6–20)
CALCIUM: 8.8 mg/dL — AB (ref 8.9–10.3)
CO2: 25 mmol/L (ref 22–32)
Chloride: 98 mmol/L — ABNORMAL LOW (ref 101–111)
Creatinine, Ser: 2.94 mg/dL — ABNORMAL HIGH (ref 0.61–1.24)
GFR calc non Af Amer: 19 mL/min — ABNORMAL LOW (ref >60)
GFR, EST AFRICAN AMERICAN: 22 mL/min — AB (ref >60)
Glucose, Bld: 220 mg/dL — ABNORMAL HIGH (ref 65–99)
Potassium: 4.2 mmol/L (ref 3.5–5.1)
SODIUM: 133 mmol/L — AB (ref 135–145)

## 2018-01-08 LAB — CBC
HCT: 35 % — ABNORMAL LOW (ref 39.0–52.0)
HEMOGLOBIN: 11.4 g/dL — AB (ref 13.0–17.0)
MCH: 31.2 pg (ref 26.0–34.0)
MCHC: 32.6 g/dL (ref 30.0–36.0)
MCV: 95.9 fL (ref 78.0–100.0)
Platelets: 149 10*3/uL — ABNORMAL LOW (ref 150–400)
RBC: 3.65 MIL/uL — AB (ref 4.22–5.81)
RDW: 12.7 % (ref 11.5–15.5)
WBC: 7.2 10*3/uL (ref 4.0–10.5)

## 2018-01-08 LAB — TROPONIN I: Troponin I: 0.03 ng/mL (ref <0.03)

## 2018-01-08 LAB — I-STAT TROPONIN, ED: TROPONIN I, POC: 0.02 ng/mL (ref 0.00–0.08)

## 2018-01-08 MED ORDER — NITROGLYCERIN 0.4 MG SL SUBL: 0.4000 mg | SUBLINGUAL_TABLET | SUBLINGUAL | Status: DC | PRN

## 2018-01-08 MED ORDER — ASPIRIN EC 81 MG PO TBEC
81.0000 mg | DELAYED_RELEASE_TABLET | Freq: Every day | ORAL | Status: DC
  Administered 2018-01-09: 81 mg via ORAL
  Filled 2018-01-08: qty 1

## 2018-01-08 MED ORDER — OMEGA-3-ACID ETHYL ESTERS 1 G PO CAPS
1.0000 g | ORAL_CAPSULE | Freq: Every day | ORAL | Status: DC
  Administered 2018-01-09 – 2018-01-10 (×2): 1 g via ORAL
  Filled 2018-01-08 (×2): qty 1

## 2018-01-08 MED ORDER — ATORVASTATIN CALCIUM 40 MG PO TABS
40.0000 mg | ORAL_TABLET | Freq: Every day | ORAL | Status: DC
  Administered 2018-01-09: 40 mg via ORAL
  Filled 2018-01-08: qty 1

## 2018-01-08 MED ORDER — AMLODIPINE BESYLATE 5 MG PO TABS
5.0000 mg | ORAL_TABLET | Freq: Every day | ORAL | Status: DC
  Administered 2018-01-09 – 2018-01-10 (×2): 5 mg via ORAL
  Filled 2018-01-08 (×2): qty 1

## 2018-01-08 MED ORDER — LEVOTHYROXINE SODIUM 50 MCG PO TABS
50.0000 ug | ORAL_TABLET | Freq: Every day | ORAL | Status: DC
  Administered 2018-01-09 – 2018-01-10 (×2): 50 ug via ORAL
  Filled 2018-01-08 (×3): qty 1

## 2018-01-08 MED ORDER — FUROSEMIDE 10 MG/ML IJ SOLN
40.0000 mg | Freq: Once | INTRAMUSCULAR | Status: AC
  Administered 2018-01-08: 40 mg via INTRAVENOUS
  Filled 2018-01-08: qty 4

## 2018-01-08 MED ORDER — INSULIN ASPART 100 UNIT/ML ~~LOC~~ SOLN
0.0000 [IU] | Freq: Three times a day (TID) | SUBCUTANEOUS | Status: DC
Start: 2018-01-09 — End: 2018-01-10
  Administered 2018-01-09: 3 [IU] via SUBCUTANEOUS
  Administered 2018-01-09: 1 [IU] via SUBCUTANEOUS
  Administered 2018-01-10: 3 [IU] via SUBCUTANEOUS
  Administered 2018-01-10: 2 [IU] via SUBCUTANEOUS

## 2018-01-08 MED ORDER — OXYCODONE-ACETAMINOPHEN 10-325 MG PO TABS
1.0000 | ORAL_TABLET | Freq: Four times a day (QID) | ORAL | Status: DC | PRN
Start: 2018-01-08 — End: 2018-01-09

## 2018-01-08 MED ORDER — MORPHINE SULFATE (PF) 4 MG/ML IV SOLN: 1.0000 mg | INTRAVENOUS | Status: DC | PRN

## 2018-01-08 MED ORDER — FUROSEMIDE 10 MG/ML IJ SOLN
40.0000 mg | Freq: Two times a day (BID) | INTRAMUSCULAR | Status: DC
  Administered 2018-01-09 – 2018-01-10 (×3): 40 mg via INTRAVENOUS
  Filled 2018-01-08 (×3): qty 4

## 2018-01-08 MED ORDER — METOPROLOL SUCCINATE ER 25 MG PO TB24
25.0000 mg | ORAL_TABLET | Freq: Every day | ORAL | Status: DC
  Administered 2018-01-10: 25 mg via ORAL
  Filled 2018-01-08: qty 1

## 2018-01-08 MED ORDER — INSULIN GLARGINE 100 UNIT/ML ~~LOC~~ SOLN
28.0000 [IU] | Freq: Two times a day (BID) | SUBCUTANEOUS | Status: DC
  Administered 2018-01-09 – 2018-01-10 (×2): 28 [IU] via SUBCUTANEOUS
  Filled 2018-01-08 (×4): qty 0.28

## 2018-01-08 NOTE — ED Notes (Addendum)
Placed pt on 2L per dr order.

## 2018-01-08 NOTE — Progress Notes (Signed)
History of Present Illness:    [name] is a 83 y.o. ([birth year]) male who presents for evaluation of claudication and left foot ulcer less than 2 months.  The patient currently describes a cramping sensation in the both lower extremity every 75 feet.  He states that he can walk half way to his mail box before he has to rest, then he can go again.  This has gotten progressively worse over the past 2 years.   There is not rest pain. There is no history of ulcerations on the feet.  Atherosclerotic risk factors and other medical problems include CAD with GABG x 4 in 2010, DM with peripheral neuropathy, HTN, history of A fib not currently on anticoagulation, and history of carotid stenosis s/p right CEA by Dr. Amedeo Plenty many years ago.    Past Medical History:  Diagnosis Date  . Afib ([place])   . Coronary artery disease 2010   Status post coronary artery bypass grafting  . Diabetes mellitus   . Hyperlipidemia   . Hypertension   . Renal insufficiency     Past Surgical History:  Procedure Laterality Date  . carotid insuff    . CORONARY ARTERY BYPASS GRAFT  2010   LIMA to LAD, SVG to diagonal, SVG to circumflex, marginal, SVG to posterior descending branch.    ROS:   General:  No weight loss, Fever, chills  HEENT: No recent headaches, no nasal bleeding, no visual changes, no sore throat  Neurologic: No dizziness, blackouts, seizures. No recent symptoms of stroke or mini- stroke. No recent episodes of slurred speech, or temporary blindness.  Cardiac: No recent episodes of chest pain/pressure, positive shortness of breath at rest when lying flat.  positive shortness of breath with exertion.  positive history of atrial fibrillation or irregular heartbeat  Vascular: No history of rest pain in feet.  No history of claudication.  No history of non-healing ulcer, No history of DVT   Pulmonary: No home oxygen, no productive cough, no hemoptysis,  No asthma or wheezing, positive  COPD  Musculoskeletal:  [ ]  Arthritis, [ ]  Low back pain,  [ ]  Joint pain  Hematologic:No history of hypercoagulable state.  No history of easy bleeding.  No history of anemia  Gastrointestinal: No hematochezia or melena,  No gastroesophageal reflux, no trouble swallowing  Urinary: [x ] chronic Kidney disease, [ ]  on HD - [ ]  MWF or [ ]  TTHS, [ ]  Burning with urination, [ ]  Frequent urination, [ ]  Difficulty urinating;   Skin: acute left plantar ulcer  Psychological: No history of anxiety,  No history of depression  Social History Social History   Tobacco Use  . Smoking status: Never Smoker  . Smokeless tobacco: Never Used  Substance Use Topics  . Alcohol use: Yes    Alcohol/week: 0.0 oz  . Drug use: Not on file    Family History Family History  Problem Relation Age of Onset  . Heart attack Father   . Heart attack Brother     Allergies  No Known Allergies   Current Outpatient Medications  Medication Sig Dispense Refill  . amLODipine (NORVASC) 5 MG tablet Take 5 mg by mouth daily.    Marland Kitchen aspirin 81 MG tablet Take 81 mg by mouth at bedtime.     Marland Kitchen atorvastatin (LIPITOR) 40 MG tablet Take 1 tablet by mouth at bedtime.  3  . furosemide (LASIX) 40 MG tablet Take 1 tablet (40 mg total) by mouth daily.  Resume on 1/15 30 tablet 0  . Insulin Aspart (NOVOLOG FLEXPEN [place]) Inject 10-15 Units into the skin 3 (three) times daily with meals.     . Insulin Glargine (LANTUS SOLOSTAR [place]) Inject 40 Units into the skin 2 (two) times daily.     Marland Kitchen lactobacillus acidophilus & bulgar (LACTINEX) chewable tablet Chew 2 tablets by mouth 3 (three) times daily. 30 tablet 0  . levothyroxine (SYNTHROID, LEVOTHROID) 50 MCG tablet Take 50 mcg by mouth daily.    . metoprolol succinate (TOPROL-XL) 50 MG 24 hr tablet Take 1 tablet (50 mg total) by mouth daily. 30 tablet 0  . Omega-3 Fatty Acids (FISH OIL PO) Take 1 capsule by mouth daily.    Marland Kitchen oxyCODONE-acetaminophen (PERCOCET) 10-325 MG tablet Take 1  tablet by mouth every 6 (six) hours as needed for pain.   0   No current facility-administered medications for this visit.     Physical Examination  Vitals:   01/08/18 1500  BP: (!) 171/59  Pulse: (!) 48  Resp: 16  Temp: 97.6 F (36.4 C)  TempSrc: Oral  SpO2: (!) 86%  Weight: 208 lb (94.3 kg)  Height: 5\' 5"  (1.651 m)    Body mass index is 34.61 kg/m.  General:  Alert and oriented, no acute distress HEENT: Normal Neck: can't rule out bruit B secondary to murmur  Pulmonary: Clear to auscultation bilaterally, no wheezing or rhonchi  Cardiac: Regular Rhythm with murmur, bradycardiac Abdomen: Soft, non-tender, non-distended, no mass, no scars Skin: No rash Extremity Pulses: Palpable 2+ radial, brachial, femoral, non palpable dorsalis pedis, posterior tibial pulses bilaterally Musculoskeletal: No deformity, pitting B LE edema, complaints of right shoulder pain at rest and with motion, clean shallow ulcer overlying 1st MT  Neurologic: Upper and lower extremity motor 5/5 and symmetric Psychiatric: Judgment intact, Mood & affect appropriate for pt's clinical situation Lymph : No Cervical, Axillary, or Inguinal lymphadenopathy    DATA:  ABI's  right monophasic below popliteals 0.93 Left monophasic below popliteal 0.65 Arterial duplex results show Biphasic flow from femoral arteries to popliteal and monophasic in the tibials. 50-74 % stenosis in right SFA and popliteals, left SFA stenosis  ASSESSMENT: Claudication with left plantar slow healing ulcer  PAD with SFA stenosis and tibial disease B   PLAN:  This gentalman was hospitalized for 3 days in [facility] 2 weeks ago and received IV antibiotics, followed by 1 week of oral antibiotics to treat a left foot infection with non healing ulcer.  The ulcer is now half the size it was about 2 cm in size without external signs of infection.    He has classic symptoms of claudication without rest pain and Above studies  demonstrate SFA stenosis with tibial disease.  His wound is showing improvement.  He will continue to follow up at the wound center for 1 more month.  At that time he will follow up with [place].  If the wound does not improve or gets worse we will schedule him for for treating to include angiogram and possible intervention.  He does have CKD with a Cr 2.8 and GFR 20.    We will schedule carotid duplex study B at his 1 month f/u for annual surveillance.    Today on exam he has low HR of 48, O2 SAT of 89 and elevate BP of 171/59.  He is not in distress.  He states he has had right shoulder pain all day that has not improved with pain medication.  He called his primary care physician who told him to go to the [facility] ED.  His daughter will drive him from here.    Roxy Horseman PA-C [facility]  The patient was seen in conjunction with Dr. Bridgett Larsson today  Addendum  I have independently interviewed and examined the patient, and I agree with the physician assistant's findings. Pt notes his plantar ulcer is healing with >100% contraction by description.  Non-invasive studies are consistent with tibial arteries disease in both legs.  Currently, the patient has acute change in his medical condition status with new onset bradycardiac and desaturation.     Pt was referred to his PCP for this new medical problems.  His PCP referred him to the ED for evaluation.  Will have patient follow up in one month for recheck.  If L plantar ulcer not healed by then, will need to consider Aortogram, left leg runoff, and possible intervention.   Adele Barthel, MD, FACS [facility] [place] Office: [phone] Pager: [phone]  01/08/2018, 4:29 PM

## 2018-01-08 NOTE — ED Provider Notes (Signed)
Emergency Department Provider Note   I have reviewed the triage vital signs and the nursing notes.   HISTORY  Chief Complaint Chest Pain   HPI [name] is a 83 y.o. male with history of coronary artery disease, diabetes, atrial fibrillation, hyperlipidemia, hypertension and congestive heart failure the presents to the emergency department today secondary to hypoxia.  Patient was getting a study done at vascular and was found to have a low oxygen, so he was sent here for further evaluation.  On review of systems patient does state that he has had worsening dyspnea on exertion with a little bit of mild chest pain but resolves with rest.  No other associated or modifying symptoms.    Past Medical History:  Diagnosis Date  . Afib ([place])   . Coronary artery disease 2010   Status post coronary artery bypass grafting  . Diabetes mellitus   . Hyperlipidemia   . Hypertension   . Renal insufficiency     Patient Active Problem List   Diagnosis Date Noted  . Cellulitis 12/24/2017  . Nausea and vomiting 12/23/2017  . DM type 2 causing vascular disease ([place]) 05/07/2016  . Essential hypertension, benign 05/07/2016  . Other specified hypothyroidism 05/07/2016  . Bilateral carotid bruits 03/11/2016  . Elevated troponin   . Chronic diastolic CHF (congestive heart failure) ([place])   . Bacteremia 02/24/2016  . Sepsis ([place]) 02/22/2016  . Uncontrolled type 2 diabetes mellitus with stage 4 chronic kidney disease ([place]) 02/22/2016  . Demand ischemia ([place]) 02/22/2016  . UTI (urinary tract infection) 02/22/2016  . Acute on chronic renal failure ([place])   . Pyrexia   . Arterial hypotension   . Hypoxia   . UTI (lower urinary tract infection)   . OBESITY, UNSPECIFIED 08/24/2009  . Hyperlipidemia 08/15/2009  . Essential hypertension 08/15/2009  . CAD, ARTERY BYPASS GRAFT 08/15/2009  . Atrial fibrillation ([place]) 08/15/2009    Past Surgical History:  Procedure Laterality Date  . carotid  insuff    . CORONARY ARTERY BYPASS GRAFT  2010   LIMA to LAD, SVG to diagonal, SVG to circumflex, marginal, SVG to posterior descending branch.    Current Outpatient Rx  . Order #: [id number] Class: Historical Med  . Order #: [id number] Class: Historical Med  . Order #: [id number] Class: Historical Med  . Order #: [id number] Class: Normal  . Order #: [id number] Class: Historical Med  . Order #: [id number] Class: Historical Med  . Order #: [id number] Class: Historical Med  . Order #: [id number] Class: Normal  . Order #: [id number] Class: Historical Med  . Order #: [id number] Class: Historical Med  . Order #: [id number] Class: Normal    Allergies Patient has no known allergies.  Family History  Problem Relation Age of Onset  . Heart attack Father   . Heart attack Brother     Social History Social History   Tobacco Use  . Smoking status: Never Smoker  . Smokeless tobacco: Never Used  Substance Use Topics  . Alcohol use: Yes    Alcohol/week: 0.0 oz  . Drug use: No    Review of Systems  All other systems negative except as documented in the HPI. All pertinent positives and negatives as reviewed in the HPI. ____________________________________________   PHYSICAL EXAM:  VITAL SIGNS: ED Triage Vitals  Enc Vitals Group     BP 01/08/18 1710 (!) 202/67     Pulse Rate 01/08/18 1710 (!) 56     Resp 01/08/18 1710 16     Temp 01/08/18 1710 97.9 F (  36.6 C)     Temp Source 01/08/18 1710 Oral     SpO2 01/08/18 1710 91 %     Weight 01/08/18 1712 208 lb (94.3 kg)     Height 01/08/18 1712 5\' 5"  (1.651 m)    Constitutional: Alert and oriented. Well appearing and in no acute distress. Eyes: Conjunctivae are normal. PERRL. EOMI. Head: Atraumatic. Nose: No congestion/rhinnorhea. Mouth/Throat: Mucous membranes are moist.  Oropharynx non-erythematous. Neck: No stridor.  No meningeal signs.   Cardiovascular: bradycardic rate, regular rhythm. Good peripheral circulation. Grossly normal heart sounds.    Respiratory: Normal respiratory effort.  No retractions. Lungs bilateral rales. Gastrointestinal: Soft and nontender. No distention.  Musculoskeletal: No lower extremity tenderness. 1+ pitting edema bilaterally to mid shin. No gross deformities of extremities. Neurologic:  Normal speech and language. No gross focal neurologic deficits are appreciated.  Skin:  Skin is warm, dry and intact. No rash noted.  ____________________________________________   LABS (all labs ordered are listed, but only abnormal results are displayed)  Labs Reviewed  BASIC METABOLIC PANEL - Abnormal; Notable for the following components:      Result Value   Sodium 133 (*)    Chloride 98 (*)    Glucose, Bld 220 (*)    BUN 41 (*)    Creatinine, Ser 2.94 (*)    Calcium 8.8 (*)    GFR calc non Af Amer 19 (*)    GFR calc Af Amer 22 (*)    All other components within normal limits  CBC - Abnormal; Notable for the following components:   RBC 3.65 (*)    Hemoglobin 11.4 (*)    HCT 35.0 (*)    Platelets 149 (*)    All other components within normal limits  TROPONIN I  I-STAT TROPONIN, ED   ____________________________________________  EKG   EKG Interpretation  Date/Time:  Friday January 08 2018 17:03:17 EST Ventricular Rate:  61 PR Interval:    QRS Duration: 110 QT Interval:  450 QTC Calculation: 453 R Axis:     Text Interpretation:  sinus rhythm Moderate voltage criteria for LVH, may be normal variant Inferior infarct , age undetermined Anterior infarct , age undetermined Abnormal ECG no significant change since 1/9 Confirmed by Merrily Pew [phone]) on 01/08/2018 9:42:33 PM       ____________________________________________  RADIOLOGY  Dg Chest 2 View  Result Date: 01/08/2018 CLINICAL DATA:  Central chest pain EXAM: CHEST  2 VIEW COMPARISON:  01/01/2018 FINDINGS: Cardiopericardial silhouette is at upper limits of normal for size. Status post CABG. Diffuse interstitial opacity is more prominent  than previously suggesting interstitial edema superimposed on chronic underlying interstitial changes. No pleural effusion. No focal airspace consolidation. IMPRESSION: Cardiomegaly with interstitial pulmonary edema pattern Electronically Signed   By: Misty Stanley M.D.   On: 01/08/2018 18:38    ____________________________________________   PROCEDURES  Procedure(s) performed:   Procedures  CRITICAL CARE Performed by: Merrily Pew Total critical care time: 35 minutes Critical care time was exclusive of separately billable procedures and treating other patients. Critical care was necessary to treat or prevent imminent or life-threatening deterioration. Critical care was time spent personally by me on the following activities: development of treatment plan with patient and/or surrogate as well as nursing, discussions with consultants, evaluation of patient's response to treatment, examination of patient, obtaining history from patient or surrogate, ordering and performing treatments and interventions, ordering and review of laboratory studies, ordering and review of radiographic studies, pulse oximetry and re-evaluation of patient's  condition.  ____________________________________________   INITIAL IMPRESSION / ASSESSMENT AND PLAN / ED COURSE  Suspect acute on chronic CHF exacerbation.  We will try to diurese and hopefully can ambulate and do well and go home.  Patient does not really have any chest pain or any other symptoms.  It was a low oxygen and tachypnea is why he is sent here.  On reevaluation he had urinated quite a bit but is hypoxic to 88 just with rolling over in the bed. Will start oxygen. Admit for CHF exacerbation.  Pertinent labs & imaging results that were available during my care of the patient were reviewed by me and considered in my medical decision making (see chart for details).  ____________________________________________  FINAL CLINICAL IMPRESSION(S) / ED  DIAGNOSES  Final diagnoses:  None     MEDICATIONS GIVEN DURING THIS VISIT:  Medications  furosemide (LASIX) injection 40 mg (40 mg Intravenous Given 01/08/18 2140)     NEW OUTPATIENT MEDICATIONS STARTED DURING THIS VISIT:  New Prescriptions   No medications on file    Note:  This note was prepared with assistance of Dragon voice recognition software. Occasional wrong-word or sound-a-like substitutions may have occurred due to the inherent limitations of voice recognition software.   Merrily Pew, MD 01/08/18 2325

## 2018-01-08 NOTE — ED Triage Notes (Signed)
Pt endorses central chest pain with radiation to the right arm and some shob. Pt was sent by vascular dr noted to have HR of 48 by vascular and spo2 86% on RA. Hx of 4 bypass surgery in 2010

## 2018-01-08 NOTE — H&P (Signed)
History and Physical    DOAK MAH DXI:[id number] DOB: Mar 14, 1935 DOA: 01/08/2018  Referring MD/NP/PA:   PCP: Deloria Lair., MD   Patient coming from:  The patient is coming from home.  At baseline, pt is independent for most of ADL.  Chief Complaint: Shortness of breath, chest pain  HPI: [name] is a 83 y.o. male with medical history significant of dCHF, hypertension, hyperlipidemia, diabetes mellitus, CAD, s/p of CABG, atrial fibrillation not on anticoagulants, CKD4, hypothyroidism, chronic left foot ulcer, who presents with chest pain and shortness.  Pt states that he had chest pain with he was in vascular surgeon's office for following up his left foot ulcer. The chest pain is located in the substernal area, 6 out of 10 in severity, pressure-like, radiating to right arm. It lasted for a few hours, then resolved spontaneously. Currently he is chest pain-free. He also reports shortness of breath. He was found to have oxygen desaturation to 86% and bradycardia in VVS office. Patient denies cough, fever or chills. He states that he has gained more than 20 pounds recently. He is taking Lasix consistently per patient. Patient does not have nausea, vomiting, diarrhea, abdominal pain, symptoms of UTI. No unilateral weakness.  ED Course: pt was found to have BNP 189.4, troponin 0.03, worsening renal function, temperature normal, bradycardia, tachypnea, CXR showed interstitial edema. Patient is admitted to telemetry bed as inpatient.  Review of Systems:   General: no fevers, chills, has body weight gain, has fatigue HEENT: no blurry vision, hearing changes or sore throat Respiratory: has dyspnea, no coughing, wheezing CV: has chest pain, no palpitations GI: no nausea, vomiting, abdominal pain, diarrhea, constipation GU: no dysuria, burning on urination, increased urinary frequency, hematuria  Ext: has leg edema Neuro: no unilateral weakness, numbness, or tingling, no vision  change or hearing loss Skin: no rash. Has left foot ulcer. MSK: No muscle spasm, no deformity, no limitation of range of movement in spin Heme: No easy bruising.  Travel history: No recent long distant travel.  Allergy: No Known Allergies  Past Medical History:  Diagnosis Date  . Afib ([place])   . Coronary artery disease 2010   Status post coronary artery bypass grafting  . Diabetes mellitus   . Hyperlipidemia   . Hypertension   . Renal insufficiency     Past Surgical History:  Procedure Laterality Date  . carotid insuff    . CORONARY ARTERY BYPASS GRAFT  2010   LIMA to LAD, SVG to diagonal, SVG to circumflex, marginal, SVG to posterior descending branch.    Social History:  reports that  has never smoked. he has never used smokeless tobacco. He reports that he drinks alcohol. He reports that he does not use drugs.  Family History:  Family History  Problem Relation Age of Onset  . Heart attack Father   . Heart attack Brother      Prior to Admission medications   Medication Sig Start Date End Date Taking? Authorizing Provider  amLODipine (NORVASC) 5 MG tablet Take 5 mg by mouth daily.   Yes [provider]  aspirin 81 MG tablet Take 81 mg by mouth at bedtime.    Yes [provider]  atorvastatin (LIPITOR) 40 MG tablet Take 1 tablet by mouth at bedtime. 01/30/16  Yes [provider]  furosemide (LASIX) 40 MG tablet Take 1 tablet (40 mg total) by mouth daily. Resume on 1/15 12/24/17  Yes Kathie Dike, MD  Insulin Aspart (NOVOLOG FLEXPEN [place])  Inject 10-15 Units into the skin as needed.    Yes [provider]  Insulin Glargine (LANTUS SOLOSTAR [place]) Inject 40 Units into the skin 2 (two) times daily.    Yes [provider]  levothyroxine (SYNTHROID, LEVOTHROID) 50 MCG tablet Take 50 mcg by mouth daily.   Yes [provider]  metoprolol succinate (TOPROL-XL) 50 MG 24 hr tablet Take 1 tablet (50 mg total) by mouth daily. 12/24/17   Yes Kathie Dike, MD  Omega-3 Fatty Acids (FISH OIL PO) Take 2 capsules by mouth daily.    Yes [provider]  oxyCODONE-acetaminophen (PERCOCET) 10-325 MG tablet Take 1 tablet by mouth every 6 (six) hours as needed for pain.  02/12/16  Yes [provider]  lactobacillus acidophilus & bulgar (LACTINEX) chewable tablet Chew 2 tablets by mouth 3 (three) times daily. Patient not taking: Reported on 01/08/2018 12/24/17   Kathie Dike, MD    Physical Exam: Vitals:   01/08/18 2330 01/09/18 0000 01/09/18 0030 01/09/18 0130  BP: (!) 158/48 (!) 126/57 (!) 141/52 (!) 142/56  Pulse: (!) 44 (!) 43 (!) 41 (!) 48  Resp: 14 14 19 17   Temp:      TempSrc:      SpO2: 100% 99% 98% 100%  Weight:      Height:       General: Not in acute distress HEENT:       Eyes: PERRL, EOMI, no scleral icterus.       ENT: No discharge from the ears and nose, no pharynx injection, no tonsillar enlargement.        Neck: Difficult to assess JVD due to obesity, no bruit, no mass felt. Heme: No neck lymph node enlargement. Cardiac: S1/S2, RRR, No murmurs, No gallops or rubs. Respiratory: No rales, wheezing, rhonchi or rubs. GI: Soft, nondistended, nontender, no rebound pain, no organomegaly, BS present. GU: No hematuria Ext: 3+ pitting leg edema bilaterally. 2+DP/PT pulse bilaterally. Musculoskeletal: No joint deformities, No joint redness or warmth, no limitation of ROM in spin. Skin: has an ulcer in left plantar area, approximately 1.5 cm in size, no active draining. Neuro: Alert, oriented X3, cranial nerves II-XII grossly intact, moves all extremities normally. Psych: Patient is not psychotic, no suicidal or hemocidal ideation.  Labs on Admission: I have personally reviewed following labs and imaging studies  CBC: Recent Labs  Lab 01/08/18 1709  WBC 7.2  HGB 11.4*  HCT 35.0*  MCV 95.9  PLT 875*   Basic Metabolic Panel: Recent Labs  Lab 01/08/18 1709  NA 133*  K 4.2  CL 98*  CO2  25  GLUCOSE 220*  BUN 41*  CREATININE 2.94*  CALCIUM 8.8*   GFR: Estimated Creatinine Clearance: 21.1 mL/min (A) (by C-G formula based on SCr of 2.94 mg/dL (H)). Liver Function Tests: No results for input(s): AST, ALT, ALKPHOS, BILITOT, PROT, ALBUMIN in the last 168 hours. No results for input(s): LIPASE, AMYLASE in the last 168 hours. No results for input(s): AMMONIA in the last 168 hours. Coagulation Profile: No results for input(s): INR, PROTIME in the last 168 hours. Cardiac Enzymes: Recent Labs  Lab 01/08/18 2137 01/09/18 0005  TROPONINI 0.03* 0.03*   BNP (last 3 results) No results for input(s): PROBNP in the last 8760 hours. HbA1C: No results for input(s): HGBA1C in the last 72 hours. CBG: No results for input(s): GLUCAP in the last 168 hours. Lipid Profile: No results for input(s): CHOL, HDL, LDLCALC, TRIG, CHOLHDL, LDLDIRECT in the last 72 hours.  Thyroid Function Tests: No results for input(s): TSH, T4TOTAL, FREET4, T3FREE, THYROIDAB in the last 72 hours. Anemia Panel: No results for input(s): VITAMINB12, FOLATE, FERRITIN, TIBC, IRON, RETICCTPCT in the last 72 hours. Urine analysis:    Component Value Date/Time   COLORURINE STRAW (A) 12/23/2017 0331   APPEARANCEUR CLEAR 12/23/2017 0331   LABSPEC 1.017 12/23/2017 0331   PHURINE 7.0 12/23/2017 0331   GLUCOSEU >=500 (A) 12/23/2017 0331   HGBUR SMALL (A) 12/23/2017 0331   BILIRUBINUR NEGATIVE 12/23/2017 0331   KETONESUR NEGATIVE 12/23/2017 0331   PROTEINUR >=300 (A) 12/23/2017 0331   UROBILINOGEN 1.0 05/12/2009 1354   NITRITE NEGATIVE 12/23/2017 0331   LEUKOCYTESUR NEGATIVE 12/23/2017 0331   Sepsis Labs: @LABRCNTIP (procalcitonin:4,lacticidven:4) )No results found for this or any previous visit (from the past 240 hour(s)).   Radiological Exams on Admission: Dg Chest 2 View  Result Date: 01/08/2018 CLINICAL DATA:  Central chest pain EXAM: CHEST  2 VIEW COMPARISON:  01/01/2018 FINDINGS: Cardiopericardial  silhouette is at upper limits of normal for size. Status post CABG. Diffuse interstitial opacity is more prominent than previously suggesting interstitial edema superimposed on chronic underlying interstitial changes. No pleural effusion. No focal airspace consolidation. IMPRESSION: Cardiomegaly with interstitial pulmonary edema pattern Electronically Signed   By: Misty Stanley M.D.   On: 01/08/2018 18:38     EKG: Independently reviewed. Sinus rhythm, QTC 453, LAD, poor R-wave progression, low voltage.   Assessment/Plan Principal Problem:   Acute on chronic diastolic CHF (congestive heart failure) (HCC) Active Problems:   Hyperlipidemia   Essential hypertension   CAD, ARTERY BYPASS GRAFT   Atrial fibrillation (HCC)   Uncontrolled type 2 diabetes mellitus with stage 4 chronic kidney disease (HCC)   Acute renal failure superimposed on stage 3 chronic kidney disease (HCC)   Chronic ulcer of left foot (HCC)   Hypothyroidism   Acute respiratory failure with hypoxia (HCC)   Acute respiratory failure with hypoxia due to acute on chronic diastolic CHF: Patient has weight gain, worsening leg edema, interstitial edema on chest x-ray, elevated BNP, clinically consistent with CHF exacerbation.  -will admit to tele bed as inpt. -Lasix 40 mg bid by IV -trop x 3 -will not get 2d echo since he just had one on 12/23/17, which showed EF 60-65% -will continue home metoprolol, ASA -Daily weights -strict I/O's -Low salt diet  Chest pain, elevated trop and hx of CAD: s/of CABG. Patient's hest has resolved. Troponin is minimally elevated to 0.03-->0.03. Most likely due to demand ischemia secondary to CHF exacerbation. - cycle CE q6 x3 and repeat EKG in the am  - prn Nitroglycerin, Morphine, and aspirin, lipitor  - Risk factor stratification: will check FLP (A1C was 10.4 on 12/23/17)  HLD:  -Continue home statin, Lipitor  HTN:  -Continue home medications: Metoprolol, amlodipine -IV hydralazine  prn  Hypothyroidism: Last TSH was 19.328 on 08/20/09, not controlled -Continue home Synthroid -Check TSH  Atrial Fibrillation: CHA2DS2-VASc Score is 6, needs oral anticoagulation, but pt is not on AC, unclear why patient is not on before meals, possibly due to high risk of fall. Pt has bradycardia with heart rate in 40s -will decrease metoprolol dose from 50-->25 mg daily -Put holding parameter for heart rate less than 60  Uncontrolled type 2 diabetes mellitus with stage 4 chronic kidney disease ([place]): Last A1c 10.4 on 12/23/17, poorly controled. Patient is taking NovoLog in the Lantus at home -will decrease Lantus dose from 40-28 units daily  -SSI  Acute renal failure superimposed on  stage 3 chronic kidney disease ([place]): Baseline creatinine 2.2, his creatinine is 2.94, BUN 41, likely due to cardiorenal syndrome. -Follow up renal function by BMP while patient is on iV lasix  Chronic ulcer of left foot ([place]): pt is following up with [facility]VS. He was seen today by PA, Laurence Slate in office. Per clinic note, "Pt was hospitalized for 3 days in [facility] 2 weeks ago and received IV antibiotics, followed by 1 week of oral antibiotics to treat a left foot infection with non healing ulcer". Patient does not have active draining from ulcer. No leukocytosis or fever. -Follow up with V[facility] -wound care consult.  DVT ppx: SQ heparin Code Status: Full code Family Communication:  Yes, patient's daughter at bed side Disposition Plan:  Anticipate discharge back to previous home environment Consults called: none   Admission status: Inpatient/tele       Date of Service 01/09/2018    Ivor Costa [facility] Pager [phone]  If 7PM-7AM, please contact night-coverage [email or web address] Password [email or web address] 01/09/2018, 2:28 AM

## 2018-01-09 ENCOUNTER — Other Ambulatory Visit: Payer: Self-pay

## 2018-01-09 DIAGNOSIS — E785 Hyperlipidemia, unspecified: Secondary | ICD-10-CM | POA: Diagnosis not present

## 2018-01-09 DIAGNOSIS — N179 Acute kidney failure, unspecified: Secondary | ICD-10-CM

## 2018-01-09 DIAGNOSIS — I248 Other forms of acute ischemic heart disease: Secondary | ICD-10-CM | POA: Diagnosis not present

## 2018-01-09 DIAGNOSIS — E11621 Type 2 diabetes mellitus with foot ulcer: Secondary | ICD-10-CM | POA: Diagnosis not present

## 2018-01-09 DIAGNOSIS — Z79899 Other long term (current) drug therapy: Secondary | ICD-10-CM | POA: Diagnosis not present

## 2018-01-09 DIAGNOSIS — Z951 Presence of aortocoronary bypass graft: Secondary | ICD-10-CM | POA: Diagnosis not present

## 2018-01-09 DIAGNOSIS — E1122 Type 2 diabetes mellitus with diabetic chronic kidney disease: Secondary | ICD-10-CM | POA: Diagnosis not present

## 2018-01-09 DIAGNOSIS — I480 Paroxysmal atrial fibrillation: Secondary | ICD-10-CM

## 2018-01-09 DIAGNOSIS — L97529 Non-pressure chronic ulcer of other part of left foot with unspecified severity: Secondary | ICD-10-CM | POA: Diagnosis not present

## 2018-01-09 DIAGNOSIS — I5033 Acute on chronic diastolic (congestive) heart failure: Secondary | ICD-10-CM | POA: Diagnosis not present

## 2018-01-09 DIAGNOSIS — I2510 Atherosclerotic heart disease of native coronary artery without angina pectoris: Secondary | ICD-10-CM | POA: Diagnosis not present

## 2018-01-09 DIAGNOSIS — R001 Bradycardia, unspecified: Secondary | ICD-10-CM | POA: Diagnosis not present

## 2018-01-09 DIAGNOSIS — Z9181 History of falling: Secondary | ICD-10-CM | POA: Diagnosis not present

## 2018-01-09 DIAGNOSIS — I10 Essential (primary) hypertension: Secondary | ICD-10-CM | POA: Diagnosis not present

## 2018-01-09 DIAGNOSIS — N183 Chronic kidney disease, stage 3 (moderate): Secondary | ICD-10-CM | POA: Diagnosis not present

## 2018-01-09 DIAGNOSIS — J9601 Acute respiratory failure with hypoxia: Secondary | ICD-10-CM | POA: Diagnosis not present

## 2018-01-09 DIAGNOSIS — I130 Hypertensive heart and chronic kidney disease with heart failure and stage 1 through stage 4 chronic kidney disease, or unspecified chronic kidney disease: Secondary | ICD-10-CM | POA: Diagnosis not present

## 2018-01-09 DIAGNOSIS — E039 Hypothyroidism, unspecified: Secondary | ICD-10-CM | POA: Diagnosis not present

## 2018-01-09 DIAGNOSIS — R0902 Hypoxemia: Secondary | ICD-10-CM | POA: Diagnosis present

## 2018-01-09 DIAGNOSIS — E1165 Type 2 diabetes mellitus with hyperglycemia: Secondary | ICD-10-CM | POA: Diagnosis not present

## 2018-01-09 DIAGNOSIS — N184 Chronic kidney disease, stage 4 (severe): Secondary | ICD-10-CM | POA: Diagnosis not present

## 2018-01-09 LAB — COMPREHENSIVE METABOLIC PANEL
ALBUMIN: 3.6 g/dL (ref 3.5–5.0)
ALT: 11 U/L — ABNORMAL LOW (ref 17–63)
ANION GAP: 10 (ref 5–15)
AST: 15 U/L (ref 15–41)
Alkaline Phosphatase: 64 U/L (ref 38–126)
BUN: 44 mg/dL — ABNORMAL HIGH (ref 6–20)
CO2: 26 mmol/L (ref 22–32)
Calcium: 9 mg/dL (ref 8.9–10.3)
Chloride: 101 mmol/L (ref 101–111)
Creatinine, Ser: 2.79 mg/dL — ABNORMAL HIGH (ref 0.61–1.24)
GFR calc Af Amer: 23 mL/min — ABNORMAL LOW (ref >60)
GFR, EST NON AFRICAN AMERICAN: 20 mL/min — AB (ref >60)
GLUCOSE: 152 mg/dL — AB (ref 65–99)
POTASSIUM: 4 mmol/L (ref 3.5–5.1)
Sodium: 137 mmol/L (ref 135–145)
TOTAL PROTEIN: 7.1 g/dL (ref 6.5–8.1)
Total Bilirubin: 0.9 mg/dL (ref 0.3–1.2)

## 2018-01-09 LAB — LIPID PANEL
CHOL/HDL RATIO: 3.7 ratio
Cholesterol: 126 mg/dL (ref 0–200)
HDL: 34 mg/dL — ABNORMAL LOW (ref >40)
LDL CALC: 74 mg/dL (ref 0–99)
Triglycerides: 89 mg/dL (ref <150)
VLDL: 18 mg/dL (ref 0–40)

## 2018-01-09 LAB — CREATININE, URINE, RANDOM: Creatinine, Urine: 19.4 mg/dL

## 2018-01-09 LAB — TSH: TSH: 2.859 u[IU]/mL (ref 0.350–4.500)

## 2018-01-09 LAB — CBG MONITORING, ED
GLUCOSE-CAPILLARY: 104 mg/dL — AB (ref 65–99)
GLUCOSE-CAPILLARY: 143 mg/dL — AB (ref 65–99)

## 2018-01-09 LAB — TROPONIN I
TROPONIN I: 0.04 ng/mL — AB (ref <0.03)
TROPONIN I: 0.03 ng/mL — AB (ref <0.03)
Troponin I: 0.03 ng/mL (ref <0.03)

## 2018-01-09 LAB — GLUCOSE, CAPILLARY
GLUCOSE-CAPILLARY: 247 mg/dL — AB (ref 65–99)
Glucose-Capillary: 256 mg/dL — ABNORMAL HIGH (ref 65–99)

## 2018-01-09 LAB — BRAIN NATRIURETIC PEPTIDE: B Natriuretic Peptide: 189.4 pg/mL — ABNORMAL HIGH (ref 0.0–100.0)

## 2018-01-09 MED ORDER — HEPARIN SODIUM (PORCINE) 5000 UNIT/ML IJ SOLN
5000.0000 [IU] | Freq: Three times a day (TID) | INTRAMUSCULAR | Status: DC
  Administered 2018-01-09 – 2018-01-10 (×5): 5000 [IU] via SUBCUTANEOUS
  Filled 2018-01-09 (×4): qty 1

## 2018-01-09 MED ORDER — OXYCODONE-ACETAMINOPHEN 5-325 MG PO TABS
1.0000 | ORAL_TABLET | Freq: Four times a day (QID) | ORAL | Status: DC | PRN
  Administered 2018-01-09 – 2018-01-10 (×2): 1 via ORAL
  Filled 2018-01-09 (×2): qty 1

## 2018-01-09 MED ORDER — ZOLPIDEM TARTRATE 5 MG PO TABS
5.0000 mg | ORAL_TABLET | Freq: Every evening | ORAL | Status: DC | PRN
  Filled 2018-01-09: qty 1

## 2018-01-09 MED ORDER — SODIUM CHLORIDE 0.9% FLUSH
3.0000 mL | Freq: Two times a day (BID) | INTRAVENOUS | Status: DC
  Administered 2018-01-09 – 2018-01-10 (×4): 3 mL via INTRAVENOUS

## 2018-01-09 MED ORDER — OXYCODONE HCL 5 MG PO TABS
5.0000 mg | ORAL_TABLET | Freq: Four times a day (QID) | ORAL | Status: DC | PRN
  Administered 2018-01-09 – 2018-01-10 (×2): 5 mg via ORAL
  Filled 2018-01-09 (×2): qty 1

## 2018-01-09 MED ORDER — ACETAMINOPHEN 325 MG PO TABS: 650.0000 mg | ORAL_TABLET | ORAL | Status: DC | PRN

## 2018-01-09 MED ORDER — ONDANSETRON HCL 4 MG/2ML IJ SOLN: 4.0000 mg | Freq: Four times a day (QID) | INTRAMUSCULAR | Status: DC | PRN

## 2018-01-09 MED ORDER — ALPRAZOLAM 0.25 MG PO TABS
0.2500 mg | ORAL_TABLET | Freq: Two times a day (BID) | ORAL | Status: DC | PRN
  Administered 2018-01-09: 0.25 mg via ORAL
  Filled 2018-01-09: qty 1

## 2018-01-09 MED ORDER — SODIUM CHLORIDE 0.9% FLUSH: 3.0000 mL | INTRAVENOUS | Status: DC | PRN

## 2018-01-09 MED ORDER — SODIUM CHLORIDE 0.9 % IV SOLN: 250.0000 mL | INTRAVENOUS | Status: DC | PRN

## 2018-01-09 MED ORDER — HYDRALAZINE HCL 20 MG/ML IJ SOLN: 5.0000 mg | INTRAMUSCULAR | Status: DC | PRN

## 2018-01-09 NOTE — Progress Notes (Signed)
PROGRESS NOTE [facility]   [name]   GYB:[id number] DOB: June 19, 1935  DOA: 01/08/2018 PCP: Deloria Lair., MD   Brief Narrative:  [name] is a 83 y.o. male with medical history significant of dCHF, hypertension, hyperlipidemia, diabetes mellitus, CAD, s/p of CABG, atrial fibrillation not on anticoagulants, CKD4, hypothyroidism, chronic left foot ulcer, who presents with chest pain and shortness. Patient admitted with working diagnosis of CHF exacerbation   Subjective: Patient seen and examined, he feels much improved, SOB has improved, swelling decreasing. No acute events overnight.   Assessment & Plan: Acute respiratory failure with hypoxia due to acute on chronic diastolic CHF:  Good urine output, so far negative 2.1 L  Continue Lasix 40 mg bid by IV No 2d echo since he just had one on 12/23/17, which showed EF 60-65% Continue home medications  Daily weights & strict I/O's Low salt diet  Chest pain, elevated trop and hx of CAD: s/of CABG. chest pain has resolved, no asymptomatic  Troponin minimally elevated and flat trend, likely demand ischemia from CHF exacerbation  Continue current treatment, no further cardiac work up at this time   HTN:  BP stable  Continue current regimen Norvasc and Metoprolol   Hypothyroidism:  Stable TSH normal  Continue synthroid   PAF -  CHA2DS2-VASc Score is 6,  Not on oral a/c unclear why ? High fall risk  Patient is currently on sinus bradycardia Metoprolol was decrease, will continue to monitor  To discuss with cardiology if a/c is appropriate as outpatient    Uncontrolled type 2 diabetes mellitus with stage 4 chronic kidney disease ([place]):   A1c 10.4 on 12/23/17, poorly controled. CBG's stable, continue Lantus ( decreased dose)  Continue SSI  Monitor CBG's   Acute renal failure superimposed on stage 3 chronic kidney disease ([place]):  Baseline Cr ~ 2.2, suspect cardiorenal syndrome Expect improvement with  diuresis  Monitor Cr closely   Chronic ulcer of left foot ([place]):  Stable, ulcer is healing.  No signs of infection, follow up as outpatient  Wound care consult   DVT prophylaxis: Heparin Sq Code Status:  Full Code Family Communication: Son at bedside  Disposition Plan: Home in 1-2 days   Consultants:   None   Procedures:   None   Antimicrobials:  None     Objective: Vitals:   01/09/18 1330 01/09/18 1430 01/09/18 1503 01/09/18 1512  BP: (!) 160/51 (!) 190/58 (!) 175/55 (!) 154/51  Pulse: (!) 44 (!) 44 60   Resp:   18   Temp:   98.2 F (36.8 C)   TempSrc:   Oral   SpO2: 98% 99% 93%   Weight:   89.9 kg (198 lb 4.8 oz)   Height:   5\' 5"  (1.651 m)     Intake/Output Summary (Last 24 hours) at 01/09/2018 1528 Last data filed at 01/09/2018 1446 Gross per 24 hour  Intake -  Output 2125 ml  Net -2125 ml   Filed Weights   01/08/18 1712 01/09/18 1503  Weight: 94.3 kg (208 lb) 89.9 kg (198 lb 4.8 oz)    Examination:  General exam: Appears calm and comfortable  HEENT: PERRLA, OP moist and clear Respiratory system: Decrease air entry at the bases, bibasilar crackles, no wheezing  Cardiovascular system: S1 & S2 heard, RRR. No JVD, murmurs, rubs or gallops Gastrointestinal system: Abdomen is nondistended, soft and nontender. Central nervous system: Alert and oriented. No focal neurological deficits. Extremities: B/L LE edema 2+ Skin:  Left foot ulcer, healing well  Psychiatry: Mood & affect appropriate.    Data Reviewed: I have personally reviewed following labs and imaging studies  CBC: Recent Labs  Lab 01/08/18 1709  WBC 7.2  HGB 11.4*  HCT 35.0*  MCV 95.9  PLT 425*   Basic Metabolic Panel: Recent Labs  Lab 01/08/18 1709 01/09/18 0947  NA 133* 137  K 4.2 4.0  CL 98* 101  CO2 25 26  GLUCOSE 220* 152*  BUN 41* 44*  CREATININE 2.94* 2.79*  CALCIUM 8.8* 9.0   GFR: Estimated Creatinine Clearance: 21.8 mL/min (A) (by C-G formula based on SCr of  2.79 mg/dL (H)). Liver Function Tests: Recent Labs  Lab 01/09/18 0947  AST 15  ALT 11*  ALKPHOS 64  BILITOT 0.9  PROT 7.1  ALBUMIN 3.6   No results for input(s): LIPASE, AMYLASE in the last 168 hours. No results for input(s): AMMONIA in the last 168 hours. Coagulation Profile: No results for input(s): INR, PROTIME in the last 168 hours. Cardiac Enzymes: Recent Labs  Lab 01/08/18 2137 01/09/18 0005 01/09/18 0452 01/09/18 1412  TROPONINI 0.03* 0.03* 0.04* 0.03*   BNP (last 3 results) No results for input(s): PROBNP in the last 8760 hours. HbA1C: No results for input(s): HGBA1C in the last 72 hours. CBG: Recent Labs  Lab 01/09/18 0644 01/09/18 1209  GLUCAP 104* 143*   Lipid Profile: Recent Labs    01/09/18 0452  CHOL 126  HDL 34*  LDLCALC 74  TRIG 89  CHOLHDL 3.7   Thyroid Function Tests: Recent Labs    01/09/18 0458  TSH 2.859   Anemia Panel: No results for input(s): VITAMINB12, FOLATE, FERRITIN, TIBC, IRON, RETICCTPCT in the last 72 hours. Sepsis Labs: No results for input(s): PROCALCITON, LATICACIDVEN in the last 168 hours.  No results found for this or any previous visit (from the past 240 hour(s)).    Radiology Studies: Dg Chest 2 View  Result Date: 01/08/2018 CLINICAL DATA:  Central chest pain EXAM: CHEST  2 VIEW COMPARISON:  01/01/2018 FINDINGS: Cardiopericardial silhouette is at upper limits of normal for size. Status post CABG. Diffuse interstitial opacity is more prominent than previously suggesting interstitial edema superimposed on chronic underlying interstitial changes. No pleural effusion. No focal airspace consolidation. IMPRESSION: Cardiomegaly with interstitial pulmonary edema pattern Electronically Signed   By: Misty Stanley M.D.   On: 01/08/2018 18:38      Scheduled Meds: . amLODipine  5 mg Oral Daily  . aspirin EC  81 mg Oral QHS  . atorvastatin  40 mg Oral QHS  . furosemide  40 mg Intravenous Q12H  . heparin  5,000 Units  Subcutaneous Q8H  . insulin aspart  0-9 Units Subcutaneous TID WC  . insulin glargine  28 Units Subcutaneous BID  . levothyroxine  50 mcg Oral QAC breakfast  . metoprolol succinate  25 mg Oral Daily  . omega-3 acid ethyl esters  1 g Oral Daily  . sodium chloride flush  3 mL Intravenous Q12H   Continuous Infusions: . sodium chloride       LOS: 0 days    Time spent: Total of 25 minutes spent with pt, greater than 50% of which was spent in discussion of  treatment, counseling and coordination of care   Chipper Oman, MD Pager: Text Page via [email or web address]   If 7PM-7AM, please contact night-coverage [email or web address] 01/09/2018, 3:28 PM

## 2018-01-09 NOTE — Progress Notes (Signed)
New pt admission from ED. Pt brought to the floor in stable condition. Vitals taken. Initial Assessment done. All immediate pertinent needs to patient addressed. Patient Guide given to patient. Important safety instructions relating to hospitalization reviewed with patient. Patient verbalized understanding. Will continue to monitor pt. 

## 2018-01-09 NOTE — Progress Notes (Signed)
Pt's oxygen satutation is 96% in RA

## 2018-01-09 NOTE — ED Notes (Signed)
Percocet given for c/o chronic left foot pain.

## 2018-01-10 DIAGNOSIS — I130 Hypertensive heart and chronic kidney disease with heart failure and stage 1 through stage 4 chronic kidney disease, or unspecified chronic kidney disease: Secondary | ICD-10-CM | POA: Diagnosis not present

## 2018-01-10 DIAGNOSIS — R0902 Hypoxemia: Secondary | ICD-10-CM | POA: Diagnosis not present

## 2018-01-10 DIAGNOSIS — I5033 Acute on chronic diastolic (congestive) heart failure: Secondary | ICD-10-CM | POA: Diagnosis not present

## 2018-01-10 LAB — BASIC METABOLIC PANEL
Anion gap: 12 (ref 5–15)
BUN: 47 mg/dL — AB (ref 6–20)
CALCIUM: 9.1 mg/dL (ref 8.9–10.3)
CHLORIDE: 101 mmol/L (ref 101–111)
CO2: 25 mmol/L (ref 22–32)
CREATININE: 2.71 mg/dL — AB (ref 0.61–1.24)
GFR calc Af Amer: 24 mL/min — ABNORMAL LOW (ref >60)
GFR calc non Af Amer: 21 mL/min — ABNORMAL LOW (ref >60)
GLUCOSE: 174 mg/dL — AB (ref 65–99)
Potassium: 3.7 mmol/L (ref 3.5–5.1)
Sodium: 138 mmol/L (ref 135–145)

## 2018-01-10 LAB — UREA NITROGEN, URINE: Urea Nitrogen, Ur: 141 mg/dL

## 2018-01-10 LAB — GLUCOSE, CAPILLARY
Glucose-Capillary: 173 mg/dL — ABNORMAL HIGH (ref 65–99)
Glucose-Capillary: 224 mg/dL — ABNORMAL HIGH (ref 65–99)

## 2018-01-10 MED ORDER — FUROSEMIDE 40 MG PO TABS: 40.0000 mg | ORAL_TABLET | Freq: Two times a day (BID) | ORAL | 0 refills | Status: DC

## 2018-01-10 MED ORDER — METOPROLOL SUCCINATE ER 25 MG PO TB24: 25.0000 mg | ORAL_TABLET | Freq: Every day | ORAL | 0 refills | Status: DC

## 2018-01-10 NOTE — Care Management Note (Signed)
Case Management Note Marvetta Gibbons RN, BSN Unit 4E-Case Manager-- 3E weekend coverage [phone]  Patient Details  Name: [name] MRN: [id number] Date of Birth: [birth year]  Subjective/Objective:    Pt admitted with Acute on Chronic HF                Action/Plan: PTA pt lived at home alone, has strong family support- still independent- HHHF screen received- no CM needs noted for discharge. Follow up in place with PCP  Expected Discharge Date:  01/10/18               Expected Discharge Plan:  Home/Self Care  In-House Referral:  NA  Discharge planning Services  CM Consult  Post Acute Care Choice:  NA Choice offered to:  NA  DME Arranged:    DME Agency:     HH Arranged:    [place] Agency:     Status of Service:  Completed, signed off  If discussed at [place] of Stay Meetings, dates discussed:    Discharge Disposition: home/self care   Additional Comments:  Dawayne Patricia, RN 01/10/2018, 10:51 AM

## 2018-01-10 NOTE — Discharge Summary (Signed)
Physician Discharge Summary  [name]  KDX:[id number]  DOB: November 12, 1935  DOA: 01/08/2018 PCP: Deloria Lair., MD  Admit date: 01/08/2018 Discharge date: 01/10/2018  Admitted From: Home  Disposition:  Home   Recommendations for Outpatient Follow-up:  1. Follow up with PCP in 1-2 weeks 2. Follow up with cardiology in 2-3 weeks to establish care, for Afib and CHF  3. Please obtain BMP/CBC in one week to monitor Hgb and Cr   Discharge Condition: Stable CODE STATUS: Full Code  Diet recommendation: Heart Healthy / Low salt  Brief/Interim Summary: For full details see H&P/progress note but in brief, [name] is a 83 y.o.malewith medical history significant ofdCHF, hypertension, hyperlipidemia, diabetes mellitus, CAD,s/p ofCABG, atrial fibrillation not on anticoagulants, CKD4, hypothyroidism,chronic left foot ulcer,who presents with chest pain and shortness. Patient admitted with working diagnosis of CHF exacerbation. Patient had good diuresis, was weaned off oxygen and clinically improved for discharge.   Subjective: Patient seen and examined, breathing back to normal, swelling of the legs significantly improved. Weaned of oxygen. Denies chest pain, SOB, palpitations and weakness   Discharge Diagnoses/Hospital Course:  Acute respiratory failure with hypoxiadue to acute on chronic diastolic CHF: Felt to be diet and fluid intake related, pt with high salt diet Weight down ~ 10 Lb, negative ~ 3.5L  Treated with IV Lasix, will d/c on increase dose of oral Lasix 40 mg BID  ECHO1/9/19, which showed EF 60-65% Low salt diet Monitor weight at home  Referred to Dr Harl Bowie to follow up in [facility]  CHF education given   Chest pain, elevated trop and hx ofCAD: s/of CABG.chest pain has resolved, no asymptomatic  Troponin minimally elevated and flat trend, likely demand ischemia from CHF exacerbation  Continue current treatment, no further cardiac work up at this time    HTN:  BP stable  Metoprolol was decreased due to sinus bradycardia  Continue Amlodipine  Follow up with [facility]   Hypothyroidism:  Stable TSH normal  Continue synthroid   PAF -  CHA2DS2-VASc Scoreis 6. Now on normal sinus rhythm  Not on oral a/c unclear why ? 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 Patient has poor diet, resume home dose insulin  Diet education given  Follow up with PCP   Acute renal failure superimposed on stage 3 chronic kidney disease ([place]): Baseline Cr ~ 2.2, suspect cardiorenal syndrome Some improvement with IV diuresis, follow up as outpatient as cr is stable  Monitor Cr in 1 week  Follow up with nephrology   Chronic ulcer of left foot ([place]):  Stable, ulcer is healing. No signs of infection, follow up as outpatient   All other chronic medical condition were stable during the hospitalization.  On the day of the discharge the patient's vitals were stable, and no other acute medical condition were reported by patient. the patient was felt safe to be discharge to home   Discharge Instructions  You were cared for by a hospitalist during your hospital stay. If you have any questions about your discharge medications or the care you received while you were in the hospital after you are discharged, you can call the unit and asked to speak with the hospitalist on call if the hospitalist that took care of you is not  available. Once you are discharged, your primary care physician will handle any further medical issues. Please note that NO REFILLS for any discharge medications will be authorized once you are discharged, as it is imperative that you return to your primary care physician (or establish a relationship with a primary care  physician if you do not have one) for your aftercare needs so that they can reassess your need for medications and monitor your lab values.  Discharge Instructions    Call MD for:  difficulty breathing, headache or visual disturbances   Complete by:  As directed    Call MD for:  extreme fatigue   Complete by:  As directed    Call MD for:  hives   Complete by:  As directed    Call MD for:  persistant dizziness or light-headedness   Complete by:  As directed    Call MD for:  persistant nausea and vomiting   Complete by:  As directed    Call MD for:  redness, tenderness, or signs of infection (pain, swelling, redness, odor or green/yellow discharge around incision site)   Complete by:  As directed    Call MD for:  severe uncontrolled pain   Complete by:  As directed    Call MD for:  temperature >100.4   Complete by:  As directed    Diet - low sodium heart healthy   Complete by:  As directed    Increase activity slowly   Complete by:  As directed      Allergies as of 01/10/2018   No Known Allergies     Medication List    STOP taking these medications   lactobacillus acidophilus & bulgar chewable tablet     TAKE these medications   amLODipine 5 MG tablet Commonly known as:  NORVASC Take 5 mg by mouth daily.   aspirin 81 MG tablet Take 81 mg by mouth at bedtime.   atorvastatin 40 MG tablet Commonly known as:  LIPITOR Take 40 mg by mouth at bedtime.   FISH OIL PO Take 2 capsules by mouth daily.   furosemide 40 MG tablet Commonly known as:  LASIX Take 1 tablet (40 mg total) by mouth 2 (two) times daily. What changed:    when to take this  additional instructions   insulin aspart 100 UNIT/ML injection Commonly known as:  novoLOG Inject 5-15 Units into the skin 3 (three) times daily as needed for high blood sugar (CBG >200).   insulin glargine 100 UNIT/ML injection Commonly known as:  LANTUS Inject 40 Units into the skin 2 (two) times daily.   levothyroxine 50  MCG tablet Commonly known as:  SYNTHROID, LEVOTHROID Take 50 mcg by mouth daily.   metoprolol succinate 25 MG 24 hr tablet Commonly known as:  TOPROL-XL Take 1 tablet (25 mg total) by mouth daily. What changed:    medication strength  how much to take   oxyCODONE-acetaminophen 10-325 MG tablet Commonly known as:  PERCOCET Take 1 tablet by mouth every 6 (six) hours as needed for pain.      Follow-up Information    Deloria Lair., MD. Schedule an appointment as soon as possible for a visit in 1 week(s).   Specialty:  Family Medicine Why:  Hospital follow up  Contact information: [place] [address] [phone]        Arnoldo Lenis, MD Follow up.   Specialty:  Cardiology Why:  Call in 2-3 day if  have not receive appointment.  Contact information: [place] [address] [phone]          No Known Allergies  Consultations:  None    Procedures/Studies: Ct Abdomen Pelvis Wo Contrast  Result Date: 12/23/2017 CLINICAL DATA:  Nausea and vomiting.  Right upper abdominal pain. EXAM: CT ABDOMEN AND PELVIS WITHOUT CONTRAST TECHNIQUE: Multidetector CT imaging of the abdomen and pelvis was performed following the standard protocol without IV contrast. COMPARISON:  CT and abdominal ultrasound 06/12/2015 FINDINGS: Lower chest: Subpleural reticulation and scarring in the lung bases, with slight improvement from prior. Coronary artery calcifications. Mitral annulus calcifications. Hepatobiliary: No focal hepatic lesion allowing for lack contrast. Mild gallbladder distention appears similar to prior exam. No calcified stone or pericholecystic inflammation. No biliary dilatation. Pancreas: Fatty atrophy.  No ductal dilatation or inflammation. Spleen: Normal in size without focal abnormality. Adrenals/Urinary Tract: Mild left adrenal thickening without dominant nodule. Normal right adrenal gland. Thinning of bilateral renal  parenchyma with symmetric perinephric edema, similar to prior exam. Bilateral ureteral prominence without frank hydronephrosis. No urolithiasis. Urinary bladder is distended to the umbilicus. No definite bladder wall thickening. Stomach/Bowel: Small hiatal hernia. Stomach physiologically distended. No evidence of bowel inflammation, obstruction or wall thickening. Normal appendix. Moderate colonic stool burden. Vascular/Lymphatic: Advanced aortic and branch atherosclerosis. Prominent periportal nodes. Slightly prominent left external iliac node measures 7 mm. Reproductive: Prostate is unremarkable. Other: Minimal fat in the inguinal canals. Small fat containing umbilical hernia. No ascites or free air. No intra-abdominal abscess. Musculoskeletal: Multilevel degenerative change throughout spine. There are no acute or suspicious osseous abnormalities. IMPRESSION: 1. Mild gallbladder distention without calcified stone or pericholecystic inflammation. This appears similar to prior exam. Consider right upper quadrant ultrasound for further evaluation. 2. Distended urinary bladder without wall thickening. Bilateral ureteral prominence likely secondary to bladder distention. 3.  Aortic Atherosclerosis (ICD10-I70.0). Electronically Signed   By: Jeb Levering M.D.   On: 12/23/2017 03:21   Dg Chest 2 View  Result Date: 01/08/2018 CLINICAL DATA:  Central chest pain EXAM: CHEST  2 VIEW COMPARISON:  01/01/2018 FINDINGS: Cardiopericardial silhouette is at upper limits of normal for size. Status post CABG. Diffuse interstitial opacity is more prominent than previously suggesting interstitial edema superimposed on chronic underlying interstitial changes. No pleural effusion. No focal airspace consolidation. IMPRESSION: Cardiomegaly with interstitial pulmonary edema pattern Electronically Signed   By: Misty Stanley M.D.   On: 01/08/2018 18:38   Dg Chest 2 View  Result Date: 12/23/2017 CLINICAL DATA:  Dyspnea x1 month with  nausea and vomiting x2 today. EXAM: CHEST  2 VIEW COMPARISON:  02/22/2016 FINDINGS: Cardiomegaly with post CABG change. Fine interstitial and reticular lung markings bilaterally may reflect areas of minimal fibrosis. No pneumonic consolidation, effusion or pneumothorax. Undersurface spurring about the right AC joint consistent with osteoarthritis. No acute osseous abnormality. IMPRESSION: Stable cardiomegaly with fine reticular interstitial lung markings that may reflect areas of interstitial lung disease/fibrosis. Electronically Signed   By: Ashley Royalty M.D.   On: 12/23/2017 00:16   US Abdomen Complete  Result Date: 12/23/2017 CLINICAL DATA:  Nausea and vomiting. Right upper quadrant pain, 2 days duration. EXAM: ABDOMEN ULTRASOUND COMPLETE COMPARISON:  CT same day FINDINGS: Gallbladder: No gallstones or wall thickening visualized. No sonographic Murphy sign noted by sonographer. Common bile duct: Diameter: 3 mm, normal Liver: No focal lesion identified. Within normal limits in parenchymal echogenicity. Portal vein is patent on color Doppler imaging with normal direction of blood flow towards  the liver. IVC: No abnormality visualized. Pancreas: Poorly seen because of overlying bowel gas. Spleen: Size and appearance within normal limits. Right Kidney: Length: 11.0 cm. Echogenicity within normal limits. No mass or hydronephrosis visualized. Left Kidney: Length: 12.9 cm. Echogenicity within normal limits. No mass or hydronephrosis visualized. Abdominal aorta: No aneurysm visualized. Other findings: None. IMPRESSION: Normal examination. The gallbladder is full, but there are no stones and there is no sonographic evidence of cholecystitis. Electronically Signed   By: Nelson Chimes M.D.   On: 12/23/2017 10:51   Mr Foot Left Wo Contrast  Result Date: 12/23/2017 CLINICAL DATA:  Left foot wound along the plantar aspect of the first MTP joint EXAM: MRI OF THE LEFT FOOT WITHOUT CONTRAST TECHNIQUE: Multiplanar,  multisequence MR imaging of the left forefoot was performed. No intravenous contrast was administered. COMPARISON:  None. FINDINGS: Patient motion degrades image quality limiting evaluation. Bones/Joint/Cartilage Soft tissue wound along the plantar aspect of the first MTP joint. No cortical destruction or periosteal reaction. No focal marrow edema. No first MTP joint effusion. No fracture or dislocation. Normal alignment. No joint effusion. Moderate osteoarthritis of the first MTP joint. Ligaments Collateral ligaments are intact.  Lisfranc ligament is intact. Muscles and Tendons Flexor, peroneal and extensor compartment tendons are intact. No muscle atrophy. Soft tissue No fluid collection or hematoma. No soft tissue mass. Severe soft tissue edema along the dorsal aspect of the foot concerning for cellulitis. IMPRESSION: 1. No osteomyelitis of the left forefoot. 2. Severe soft tissue edema along the dorsal aspect of the foot concerning for cellulitis. Electronically Signed   By: Kathreen Devoid   On: 12/23/2017 10:13   Dg Foot Complete Left  Result Date: 12/23/2017 CLINICAL DATA:  Foot pain with associated plantar ulcer. EXAM: LEFT FOOT - COMPLETE 3+ VIEW COMPARISON:  None. FINDINGS: There is no evidence of fracture. Periarticular sclerosis at the first metatarsophalangeal joint without definite erosions. Possible erosions at the fifth metatarsophalangeal joint plantar soft tissue swelling. Vascular calcifications are noted. IMPRESSION: Periarticular sclerosis at the first metatarsophalangeal joint. This may represent arthritic changes or chronic osteomyelitis. Possible erosions at the fifth metatarsophalangeal joint, which may be seen with acute osteomyelitis. Electronically Signed   By: Fidela Salisbury M.D.   On: 12/23/2017 00:20    Discharge Exam: Vitals:   01/10/18 0000 01/10/18 0501  BP: (!) 107/37 (!) 142/51  Pulse: (!) 47 79  Resp: 18 18  Temp: 98.7 F (37.1 C) 98.3 F (36.8 C)  SpO2: 94% 94%    Vitals:   01/09/18 1600 01/09/18 2002 01/10/18 0000 01/10/18 0501  BP:  (!) 152/67 (!) 107/37 (!) 142/51  Pulse:  (!) 50 (!) 47 79  Resp:  16 18 18   Temp:  98 F (36.7 C) 98.7 F (37.1 C) 98.3 F (36.8 C)  TempSrc:  Oral Oral Oral  SpO2: 96% 94% 94% 94%  Weight:    87.3 kg (192 lb 8 oz)  Height:        General: Pt is alert, awake, not in acute distress Cardiovascular: RRR, S1/S2 +, no rubs, no gallops Respiratory: CTA bilaterally, no wheezing, no rhonchi Abdominal: Soft, NT, ND, bowel sounds + Extremities: Trace LE edema b/l    The results of significant diagnostics from this hospitalization (including imaging, microbiology, ancillary and laboratory) are listed below for reference.     Microbiology: No results found for this or any previous visit (from the past 240 hour(s)).   Labs: BNP (last 3 results) Recent Labs  12/22/17 2329 01/09/18 0005  BNP 140.0* 110.3*   Basic Metabolic Panel: Recent Labs  Lab 01/08/18 1709 01/09/18 0947 01/10/18 0628  NA 133* 137 138  K 4.2 4.0 3.7  CL 98* 101 101  CO2 25 26 25   GLUCOSE 220* 152* 174*  BUN 41* 44* 47*  CREATININE 2.94* 2.79* 2.71*  CALCIUM 8.8* 9.0 9.1   Liver Function Tests: Recent Labs  Lab 01/09/18 0947  AST 15  ALT 11*  ALKPHOS 64  BILITOT 0.9  PROT 7.1  ALBUMIN 3.6   No results for input(s): LIPASE, AMYLASE in the last 168 hours. No results for input(s): AMMONIA in the last 168 hours. CBC: Recent Labs  Lab 01/08/18 1709  WBC 7.2  HGB 11.4*  HCT 35.0*  MCV 95.9  PLT 149*   Cardiac Enzymes: Recent Labs  Lab 01/08/18 2137 01/09/18 0005 01/09/18 0452 01/09/18 1412  TROPONINI 0.03* 0.03* 0.04* 0.03*   BNP: Invalid input(s): POCBNP CBG: Recent Labs  Lab 01/09/18 0644 01/09/18 1209 01/09/18 1620 01/09/18 2100 01/10/18 0747  GLUCAP 104* 143* 247* 256* 173*   D-Dimer No results for input(s): DDIMER in the last 72 hours. Hgb A1c No results for input(s): HGBA1C in the last 72  hours. Lipid Profile Recent Labs    01/09/18 0452  CHOL 126  HDL 34*  LDLCALC 74  TRIG 89  CHOLHDL 3.7   Thyroid function studies Recent Labs    01/09/18 0458  TSH 2.859   Anemia work up No results for input(s): VITAMINB12, FOLATE, FERRITIN, TIBC, IRON, RETICCTPCT in the last 72 hours. Urinalysis    Component Value Date/Time   COLORURINE STRAW (A) 12/23/2017 0331   APPEARANCEUR CLEAR 12/23/2017 0331   LABSPEC 1.017 12/23/2017 0331   PHURINE 7.0 12/23/2017 0331   GLUCOSEU >=500 (A) 12/23/2017 0331   HGBUR SMALL (A) 12/23/2017 0331   BILIRUBINUR NEGATIVE 12/23/2017 0331   KETONESUR NEGATIVE 12/23/2017 0331   PROTEINUR >=300 (A) 12/23/2017 0331   UROBILINOGEN 1.0 05/12/2009 1354   NITRITE NEGATIVE 12/23/2017 0331   LEUKOCYTESUR NEGATIVE 12/23/2017 0331   Sepsis Labs Invalid input(s): PROCALCITONIN,  WBC,  LACTICIDVEN Microbiology No results found for this or any previous visit (from the past 240 hour(s)).   Time coordinating discharge: 32 minutes  SIGNED:  Chipper Oman, MD  [facility] 01/10/2018, 10:50 AM  Pager please text page via  [email or web address]

## 2018-01-10 NOTE — Consult Note (Signed)
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  That provider asked patient/patient's family to obtain an OTC ointment that is to be applied every three days.  I suspect it is either iodosorb gel or collagenase (santyl).  The patient informs me that his son was able to obtain from an on-line supplier, so it is likely the former rather than the latter. Wound type:Neuropathic Pressure Injury POA: NA Measurement:0.4cm round with flat periwound callous measuring 1cm circumferentially Wound bed:red, moist Drainage (amount, consistency, odor) scant serous Periwound:As described above Dressing procedure/placement/frequency: Patient is being discharged to home today and has a band aid-type adhesive bandage on the ulcer.  His family has been instructed on the application of the medication the outpatient [facility] MD has prescribed and they are to follow up in that office.  They have no questions.  I write no new orders today. Patient is educated/reminded to wear protective shoe wear at all times while at home to reduce the likelihood of plantar injury, particularly in the presence of neutrropathy. He is also taught about the importance of daily foot inspection.  [place] nursing team will not follow, but will remain available to this patient, the nursing and medical teams.  Please re-consult if needed. Thanks, Maudie Flakes, MSN, RN, [place], Arther Abbott  Pager# [phone]

## 2018-01-10 NOTE — Progress Notes (Addendum)
Pt got discharged to home, discharge instructions provided and patient showed understanding to it, IV taken out,Telemonitor DC,pt left unit in wheelchair with all of the belongings accompanied with a family member (Daughter)  Maikol Grassia, RN 

## 2018-02-01 ENCOUNTER — Ambulatory Visit: Payer: Medicare HMO | Admitting: Cardiovascular Disease

## 2018-02-02 NOTE — Progress Notes (Deleted)
Cardiology Office Note    Date:  02/02/2018   ID:  [name], DOB 1935-04-12, MRN [id number]  PCP:  Deloria Lair., MD  Cardiologist: No primary care provider on file.  No chief complaint on file.   History of Present Illness:  [name] is a 83 y.o. male  Patient has history of CABG in 2010 and was formerly followed by Dr. Percival Spanish.  I saw the patient in 02/2016 after a hospitalization with hypotension and urosepsis.  He had diastolic heart failure 2D echo showed LVEF 60-65% with grade 1 DD.  Patient was just discharged from the hospital 01/10/18 with acute respiratory failure secondary to acute on chronic diastolic CHF.  This was felt secondary to high salt diet and fluid intake related.  He diuresed 10 pounds and 3.5 L.  2D echo LVEF 60-65%.  Troponins minimally elevated and flat felt secondary to demand ischemia.  PAF with CHA2DS2-VASc score equals 6 converted to normal sinus rhythm he was not on oral anticoagulation recommended to discuss with cardiology  Past Medical History:  Diagnosis Date  . Afib ([place])   . Coronary artery disease 2010   Status post coronary artery bypass grafting  . Diabetes mellitus   . Hyperlipidemia   . Hypertension   . Renal insufficiency     Past Surgical History:  Procedure Laterality Date  . carotid insuff    . CORONARY ARTERY BYPASS GRAFT  2010   LIMA to LAD, SVG to diagonal, SVG to circumflex, marginal, SVG to posterior descending branch.    Current Medications: No outpatient medications have been marked as taking for the 02/03/18 encounter (Appointment) with Imogene Burn, PA-C.     Allergies:   Patient has no known allergies.   Social History   Socioeconomic History  . Marital status: Widowed    Spouse name: Not on file  . Number of children: Not on file  . Years of education: Not on file  . Highest education level: Not on file  Social Needs  . Financial resource strain: Not on file  . Food insecurity - worry:  Not on file  . Food insecurity - inability: Not on file  . Transportation needs - medical: Not on file  . Transportation needs - non-medical: Not on file  Occupational History  . Not on file  Tobacco Use  . Smoking status: Never Smoker  . Smokeless tobacco: Never Used  Substance and Sexual Activity  . Alcohol use: Yes    Alcohol/week: 0.0 oz  . Drug use: No  . Sexual activity: Not on file  Other Topics Concern  . Not on file  Social History Narrative  . Not on file     Family History:  The patient's ***family history includes Heart attack in his brother and father.   ROS:   Please see the history of present illness.    Review of Systems  Constitution: Negative.  HENT: Negative.   Cardiovascular: Negative.   Respiratory: Negative.   Endocrine: Negative.   Hematologic/Lymphatic: Negative.   Musculoskeletal: Negative.   Gastrointestinal: Negative.   Genitourinary: Negative.   Neurological: Negative.    All other systems reviewed and are negative.   PHYSICAL EXAM:   VS:  There were no vitals taken for this visit.  Physical Exam  GEN: Well nourished, well developed, in no acute distress  HEENT: normal  Neck: no JVD, carotid bruits, or masses Cardiac:RRR; no murmurs, rubs, or gallops  Respiratory:  clear to auscultation  bilaterally, normal work of breathing GI: soft, nontender, nondistended, + BS Ext: without cyanosis, clubbing, or edema, Good distal pulses bilaterally MS: no deformity or atrophy  Skin: warm and dry, no rash Neuro:  Alert and Oriented x 3, Strength and sensation are intact Psych: euthymic mood, full affect  Wt Readings from Last 3 Encounters:  01/10/18 192 lb 8 oz (87.3 kg)  01/08/18 208 lb (94.3 kg)  12/24/17 203 lb 14.8 oz (92.5 kg)      Studies/Labs Reviewed:   EKG:  EKG is*** ordered today.  The ekg ordered today demonstrates ***  Recent Labs: 01/08/2018: Hemoglobin 11.4; Platelets 149 01/09/2018: ALT 11; B Natriuretic Peptide 189.4; TSH  2.859 01/10/2018: BUN 47; Creatinine, Ser 2.71; Potassium 3.7; Sodium 138   Lipid Panel    Component Value Date/Time   CHOL 126 01/09/2018 0452   TRIG 89 01/09/2018 0452   HDL 34 (L) 01/09/2018 0452   CHOLHDL 3.7 01/09/2018 0452   VLDL 18 01/09/2018 0452   LDLCALC 74 01/09/2018 0452    Additional studies/ records that were reviewed today include:  2D echo 1/2019Study Conclusions   - Left ventricle: The cavity size was normal. Wall thickness was   increased in a pattern of moderate LVH. Systolic function was   normal. The estimated ejection fraction was in the range of 60%   to 65%. The study is not technically sufficient to allow   evaluation of LV diastolic function. - Aortic valve: Moderately calcified annulus. Trileaflet;   moderately thickened leaflets. Valve area (VTI): 3.78 cm^2. Valve   area (Vmax): 3.12 cm^2. - Mitral valve: Moderately to severely calcified annulus. Normal   thickness leaflets . - Technically difficult study. Echocontrast was used to enhance   visualization. Echocardiogram 02/22/2016 Left ventricle: The cavity size was normal. Wall thickness was   increased in a pattern of moderate LVH. Systolic function was   normal. The estimated ejection fraction was in the range of 60%   to 65%. Images were inadequate for LV wall motion assessment.   Doppler parameters are consistent with abnormal left ventricular   relaxation (grade 1 diastolic dysfunction). - Aortic valve: Trileaflet; mildly thickened, mildly calcified   leaflets. There was no stenosis. - Mitral valve: Calcified annulus. - Left atrium: The atrium was mildly to moderately dilated.    Carotid Dopplers 2017IMPRESSION: Right:   Heterogeneous plaque of the right carotid system including at the carotid bifurcation, with discordant results regarding degree of stenosis by established duplex criteria. Peak velocity suggests 50% - 69% stenosis, with the ICA/ CCA ratio suggesting a lesser degree of  stenosis. If establishing a more accurate degree of stenosis is required, cerebral angiogram should be considered, or as a second best test, CTA.   Left:   Heterogeneous plaque of the left carotid system including at the carotid bifurcation, with discordant results regarding degree of stenosis by established duplex criteria. Peak velocity suggests 50%- 69% stenosis, with the ICA/ CCA ratio suggesting a lesser degree of stenosis. If establishing a more accurate degree of stenosis is required, cerebral angiogram should be considered, or as a second best test, CTA.   Right vertebral artery demonstrates systolic reversal of flow, compatible with partial steal. This is suggestive of more proximal stenosis of the innominate artery or right proximal subclavian artery. Correlation with bilateral upper extremity blood pressure cuff measurements is recommended, and possibly further imaging with CT angiogram.        ASSESSMENT:    1. Atherosclerosis of coronary artery bypass  graft of native heart without angina pectoris   2. Acute on chronic diastolic CHF (congestive heart failure) (HCC)   3. Paroxysmal atrial fibrillation ([place])   4. Essential hypertension      PLAN:  In order of problems listed above:  CAD status post CABG in 2010 with recent hospitalization with heart failure elevated troponins but flat felt secondary to demand ischemia  Chronic diastolic CHF with recent respiratory failure and acute exacerbation secondary to dietary indiscretion.  2D echo LVEF 60-65% with moderate LVH can evaluate LV disc diastolic function  PAF review of EKGs one shows some atrial fibrillation.  CHA2DS2-VASc equals 6 has not been anticoagulated question secondary to fall risk  Essential hypertension  Medication Adjustments/Labs and Tests Ordered: Current medicines are reviewed at length with the patient today.  Concerns regarding medicines are outlined above.  Medication changes, Labs and  Tests ordered today are listed in the Patient Instructions below. There are no Patient Instructions on file for this visit.   Signed, Ermalinda Barrios, PA-C  02/02/2018 2:50 PM    [facility] [address], [place], [place]  [address] Phone: [phone]; Fax: [phone]

## 2018-02-03 ENCOUNTER — Ambulatory Visit: Payer: Medicare HMO | Admitting: Physician Assistant

## 2018-02-10 ENCOUNTER — Other Ambulatory Visit: Payer: Self-pay

## 2018-02-10 DIAGNOSIS — I6523 Occlusion and stenosis of bilateral carotid arteries: Secondary | ICD-10-CM

## 2018-02-15 ENCOUNTER — Encounter: Payer: Self-pay | Admitting: Physician Assistant

## 2018-02-15 NOTE — Progress Notes (Deleted)
Established Critical Limb Ischemia Patient   History of Present Illness   [name] is a 83 y.o. ([birth year]) male who presents with chief complaint: left foot ulcers.  Pt was last seen in the office on 01/08/18 with slowly healing left foot ulcers and significant tibial artery disease.  Due to acute decompensation of his respiratory status, he was sent via EMS to the ED for further medical stabilization.  He was found to have a CHF decompensation.  The patient returns today for re-evaluation of the L foot.    ***The patient has *** rest pain and wounds include: ***.  The patient notes symptoms have *** progressed.  The patient's treatment regimen currently included: maximal medical management and ***.  The patient's PMH, PSH, SH, and FamHx are unchanged from 01/08/18.  Current Outpatient Medications  Medication Sig Dispense Refill  . amLODipine (NORVASC) 5 MG tablet Take 5 mg by mouth daily.    Marland Kitchen aspirin 81 MG tablet Take 81 mg by mouth at bedtime.     Marland Kitchen atorvastatin (LIPITOR) 40 MG tablet Take 40 mg by mouth at bedtime.   3  . furosemide (LASIX) 40 MG tablet Take 1 tablet (40 mg total) by mouth 2 (two) times daily. 60 tablet 0  . insulin aspart (NOVOLOG) 100 UNIT/ML injection Inject 5-15 Units into the skin 3 (three) times daily as needed for high blood sugar (CBG >200).    . insulin glargine (LANTUS) 100 UNIT/ML injection Inject 40 Units into the skin 2 (two) times daily.    Marland Kitchen levothyroxine (SYNTHROID, LEVOTHROID) 50 MCG tablet Take 50 mcg by mouth daily.    . metoprolol succinate (TOPROL-XL) 25 MG 24 hr tablet Take 1 tablet (25 mg total) by mouth daily. 30 tablet 0  . Omega-3 Fatty Acids (FISH OIL PO) Take 2 capsules by mouth daily.     Marland Kitchen oxyCODONE-acetaminophen (PERCOCET) 10-325 MG tablet Take 1 tablet by mouth every 6 (six) hours as needed for pain.   0   No current facility-administered medications for this visit.     On ROS today: ***, ***   Physical Examination    ***There were no vitals filed for this visit. ***There is no height or weight on file to calculate BMI.  General {LOC:19197::"Somulent","Alert"}, {Orientation:19197::"Confused","O x 3"}, {Weight:19197::"Obese","Cachectic","WD"}, {General state of health:19197::"Ill appearing","Elderly","NAD"}  Pulmonary {Chest wall:19197::"Asx chest movement","Sym exp"}, {Air movt:19197::"Decreased *** air movt","good B air movt"}, {BS:19197::"rales on ***","rhonchi on ***","wheezing on ***","CTA B"}  Cardiac {Rhythm:19197::"Irregularly, irregular rate and rhythm","RRR, Nl S1, S2"}, {Murmur:19197::"Murmur present: ***","no Murmurs"}, {Rubs:19197::"Rub present: ***","No rubs"}, {Gallop:19197::"Gallop present: ***","No S3,S4"}  Vascular Vessel Right Left  Radial {Palpable:19197::"Not palpable","Faintly palpable","Palpable"} {Palpable:19197::"Not palpable","Faintly palpable","Palpable"}  Brachial {Palpable:19197::"Not palpable","Faintly palpable","Palpable"} {Palpable:19197::"Not palpable","Faintly palpable","Palpable"}  Carotid Palpable, {Bruit:19197::"Bruit present","No Bruit"} Palpable, {Bruit:19197::"Bruit present","No Bruit"}  Aorta Not palpable N/A  Femoral {Palpable:19197::"Not palpable","Faintly palpable","Palpable"} {Palpable:19197::"Not palpable","Faintly palpable","Palpable"}  Popliteal {Palpable:19197::"Prominently palpable","Not palpable"} {Palpable:19197::"Prominently palpable","Not palpable"}  PT {Palpable:19197::"Not palpable","Faintly palpable","Palpable"} {Palpable:19197::"Not palpable","Faintly palpable","Palpable"}  DP {Palpable:19197::"Not palpable","Faintly palpable","Palpable"} {Palpable:19197::"Not palpable","Faintly palpable","Palpable"}    Gastro- intestinal soft, {Distension:19197::"distended","non-distended"}, {TTP:19197::"TTP in *** quadrant","appropriate tenderness to palpation","non-tender to palpation"}, {Guarding:19197::"Guarding and rebound in *** quadrant","No guarding or rebound"},  {HSM:19197::"HSM present","no HSM"}, {Masses:19197::"Mass present: ***","no masses"}, {Flank:19197::"CVAT on ***","Flank bruit present on ***","no CVAT B"}, {AAA:19197::"Palpable prominent aortic pulse","No palpable prominent aortic pulse"}, {Surgical incision:19197::"Surg incisions: ***","Surgical incisions well healed"," "}  Musculo- skeletal M/S 5/5 throughout {MS:19197::"except ***"," "}, Extremities without ischemic changes {MS:19197::"except ***"," "}, {Edema:19197::"Pitting edema present: ***","Non-pitting edema present: ***","No edema present"}, {Varicosities:19197::"Varicosities present: ***","No visible  varicosities "}, {LDS:19197::"Lipodermatosclerosis present: ***","No Lipodermatosclerosis present"}  Neurologic Cranial nerves 2-12 intact{CN:19197::" except ***"," "}, Pain and light touch intact in extremities{CN:19197::" except for decreased sensation in ***"," "}, Motor exam as listed above    Non-Invasive Vascular Imaging   ABI (***)  R:   ABI: *** (***),   PT: {Signals:19197::"none","mono","bi","tri"}  DP: {Signals:19197::"none","mono","bi","tri"}  TBI:  ***  L:   ABI: *** (***),   PT: {Signals:19197::"none","mono","bi","tri"}  DP: {Signals:19197::"none","mono","bi","tri"}  TBI: ***   Medical Decision Making   [name] is a 83 y.o. male who presents with: LLE critical limb ischemia with ulcers, acute CHF decompensation   Based on the patient's vascular studies and examination, I have offered the patient: ***.  I discussed in depth with the patient the nature of atherosclerosis, and emphasized the importance of maximal medical management including strict control of blood pressure, blood glucose, and lipid levels, antiplatelet agents, obtaining regular exercise, and cessation of smoking.    The patient is aware that without maximal medical management the underlying atherosclerotic disease process will progress, limiting the benefit of any  interventions. The patient is currently on a statin: Lipitor.  The patient is currently on an anti-platelet: ASA.  Thank you for allowing [place] to participate in this patient's care.   Adele Barthel, MD, FACS [facility] Office: [phone] Pager: [phone]

## 2018-02-15 NOTE — Progress Notes (Deleted)
Cardiology Office Note    Date:  02/15/2018  ID:  [name], DOB 12-20-1934, MRN [id number] PCP:  Deloria Lair., MD  Cardiologist:  Dr. Bronson Ing  Chief Complaint: f/u CHF  History of Present Illness:  [name] is a 83 y.o. male with history of CAD s/p CABG 8250, chronic diastolic CHF, carotid disease s/p R CEA, HTN, HLD, question of paroxysmal atrial fib, DM, CKD IV, PAD (carotid, possible partial steal on the R, LE PAD), anemia/thrombocytopenia who presents for f/u CHF.  In 2017 he was admitted for sepsis with troponin 1.3. This was felt to be demand ischemia.In setting of preserved LV function and CKD, medical therapy was continued. An NP note references afib on admission but EKGs reviewed and showed sinus tach/sinus rhythm. Anticoagulation has not been commented on in any recent notes; Dr. Rosezella [place] first note in the system in 2010 indicates this was a post-op phenomenon. Last carotid duplex in 2017 showed moerate carotid disease bilaterally (50-69%), with right vertebral showing systolic reversal of flow compatible with partial steal suggestive of stenosis of innominate artery or R prox subclavian - correlation recomended. He has since been followed by vascular surgery also for non-healing foot wound and abnormal ABIs suggestive of SFA stenosis and tibial disease - this is being followed with wound care for now given CKD. During a recent visit there he was found to be bradycardic in the upper 40s and hypoxic into the 80s so was sent to the ER and admitted for acute respiratory failure felt dueu to acute on chronic diastolic CHF. This was felt related to high salt diet and fluid intake by patient. He was diuresed with IV Lasix. Troponin was minimally elevated felt due to CHF. He was in NSR per notes. Metoprolol was decreased due to bradycardia. Also had AKI on CKD stage III-IV with dc Cr 2.71 (previously 2.2 range), K 3.7, troponin 0.04, TSH wnl, LDL 74, Hbg 11.4 (c/w prior), plt  149. 2D echo 12/2017: mod LVH, EF 60-65%, calcified AV/MV annulus, technically difficult.   Chronic diastolic CHF CAD Sinus bradycardia History of PAF CKD IV      Past Medical History:  Diagnosis Date  . Anemia   . Carotid artery disease ([place])   . CKD (chronic kidney disease), stage IV ([place])   . Coronary artery disease 2010   Status post coronary artery bypass grafting  . Diabetes mellitus   . Foot ulcer ([place])   . Hyperlipidemia   . Hypertension   . PAD (peripheral artery disease) ([place])   . Postoperative atrial fibrillation ([place])   . Thrombocytopenia ([place])     Past Surgical History:  Procedure Laterality Date  . carotid insuff    . CORONARY ARTERY BYPASS GRAFT  2010   LIMA to LAD, SVG to diagonal, SVG to circumflex, marginal, SVG to posterior descending branch.    Current Medications: No outpatient medications have been marked as taking for the 02/16/18 encounter (Appointment) with Charlie Pitter, PA-C.   ***   Allergies:   Patient has no known allergies.   Social History   Socioeconomic History  . Marital status: Widowed    Spouse name: Not on file  . Number of children: Not on file  . Years of education: Not on file  . Highest education level: Not on file  Social Needs  . Financial resource strain: Not on file  . Food insecurity - worry: Not on file  . Food insecurity - inability: Not on file  .  Transportation needs - medical: Not on file  . Transportation needs - non-medical: Not on file  Occupational History  . Not on file  Tobacco Use  . Smoking status: Never Smoker  . Smokeless tobacco: Never Used  Substance and Sexual Activity  . Alcohol use: Yes    Alcohol/week: 0.0 oz  . Drug use: No  . Sexual activity: Not on file  Other Topics Concern  . Not on file  Social History Narrative  . Not on file     Family History:  Family History  Problem Relation Age of Onset  . Heart attack Father   . Heart attack Brother    ***  ROS:   Please see  the history of present illness. Otherwise, review of systems is positive for ***.  All other systems are reviewed and otherwise negative.    PHYSICAL EXAM:   VS:  There were no vitals taken for this visit.  BMI: There is no height or weight on file to calculate BMI. GEN: Well nourished, well developed, in no acute distress  HEENT: normocephalic, atraumatic Neck: no JVD, carotid bruits, or masses Cardiac: ***RRR; no murmurs, rubs, or gallops, no edema  Respiratory:  clear to auscultation bilaterally, normal work of breathing GI: soft, nontender, nondistended, + BS MS: no deformity or atrophy  Skin: warm and dry, no rash Neuro:  Alert and Oriented x 3, Strength and sensation are intact, follows commands Psych: euthymic mood, full affect  Wt Readings from Last 3 Encounters:  01/10/18 192 lb 8 oz (87.3 kg)  01/08/18 208 lb (94.3 kg)  12/24/17 203 lb 14.8 oz (92.5 kg)      Studies/Labs Reviewed:   EKG:  EKG was ordered today and personally reviewed by me and demonstrates *** EKG was not ordered today.***  Recent Labs: 01/08/2018: Hemoglobin 11.4; Platelets 149 01/09/2018: ALT 11; B Natriuretic Peptide 189.4; TSH 2.859 01/10/2018: BUN 47; Creatinine, Ser 2.71; Potassium 3.7; Sodium 138   Lipid Panel    Component Value Date/Time   CHOL 126 01/09/2018 0452   TRIG 89 01/09/2018 0452   HDL 34 (L) 01/09/2018 0452   CHOLHDL 3.7 01/09/2018 0452   VLDL 18 01/09/2018 0452   LDLCALC 74 01/09/2018 0452    Additional studies/ records that were reviewed today include: Summarized above.***    ASSESSMENT & PLAN:   1. ***  Disposition: F/u with ***   Medication Adjustments/Labs and Tests Ordered: Current medicines are reviewed at length with the patient today.  Concerns regarding medicines are outlined above. Medication changes, Labs and Tests ordered today are summarized above and listed in the Patient Instructions accessible in Encounters.   Signed, Charlie Pitter, PA-C  02/15/2018  7:34 AM    [facility] [place], [place] [address] Ph: [phone]; Fax [phone]

## 2018-02-16 ENCOUNTER — Ambulatory Visit: Payer: Medicare HMO | Admitting: Physician Assistant

## 2018-02-16 DIAGNOSIS — R0989 Other specified symptoms and signs involving the circulatory and respiratory systems: Secondary | ICD-10-CM

## 2018-02-17 ENCOUNTER — Ambulatory Visit: Payer: Medicare HMO | Admitting: Vascular Surgery

## 2018-02-17 ENCOUNTER — Encounter: Payer: Self-pay | Admitting: Physician Assistant

## 2018-02-17 ENCOUNTER — Encounter (HOSPITAL_COMMUNITY): Payer: Medicare HMO

## 2018-04-23 NOTE — Progress Notes (Deleted)
Established Critical Limb Ischemia Patient   History of Present Illness   [name] is a 83 y.o. (07/31/1935) male who presents with chief complaint: ***.  Patient had healing L foot ulcer and intermittent claudication.  On presentation last visit, patient had obvious CHF exacerbation and was sent back to his PCP for optimization.  The patient returns for re-evaluation of the left foot.  ***  The patient has *** rest pain and wounds include: ***.  The patient notes symptoms have *** progressed.  The patient's treatment regimen currently included: maximal medical management and ***.  The patient's PMH, PSH, SH, and FamHx were reviewed on 04/28/18 are unchanged from 01/08/18.  Current Outpatient Medications  Medication Sig Dispense Refill  . amLODipine (NORVASC) 5 MG tablet Take 5 mg by mouth daily.    Marland Kitchen aspirin 81 MG tablet Take 81 mg by mouth at bedtime.     Marland Kitchen atorvastatin (LIPITOR) 40 MG tablet Take 40 mg by mouth at bedtime.   3  . furosemide (LASIX) 40 MG tablet Take 1 tablet (40 mg total) by mouth 2 (two) times daily. 60 tablet 0  . insulin aspart (NOVOLOG) 100 UNIT/ML injection Inject 5-15 Units into the skin 3 (three) times daily as needed for high blood sugar (CBG >200).    . insulin glargine (LANTUS) 100 UNIT/ML injection Inject 40 Units into the skin 2 (two) times daily.    Marland Kitchen levothyroxine (SYNTHROID, LEVOTHROID) 50 MCG tablet Take 50 mcg by mouth daily.    . metoprolol succinate (TOPROL-XL) 25 MG 24 hr tablet Take 1 tablet (25 mg total) by mouth daily. 30 tablet 0  . Omega-3 Fatty Acids (FISH OIL PO) Take 2 capsules by mouth daily.     Marland Kitchen oxyCODONE-acetaminophen (PERCOCET) 10-325 MG tablet Take 1 tablet by mouth every 6 (six) hours as needed for pain.   0   No current facility-administered medications for this visit.     On ROS today: ***, ***   Physical Examination  ***There were no vitals filed for this visit. ***There is no height or weight on file to calculate  BMI.  General {LOC:19197::"Somulent","Alert"}, {Orientation:19197::"Confused","O x 3"}, {Weight:19197::"Obese","Cachectic","WD"}, {General state of health:19197::"Ill appearing","Elderly","NAD"}  Pulmonary {Chest wall:19197::"Asx chest movement","Sym exp"}, {Air movt:19197::"Decreased *** air movt","good B air movt"}, {BS:19197::"rales on ***","rhonchi on ***","wheezing on ***","CTA B"}  Cardiac {Rhythm:19197::"Irregularly, irregular rate and rhythm","RRR, Nl S1, S2"}, {Murmur:19197::"Murmur present: ***","no Murmurs"}, {Rubs:19197::"Rub present: ***","No rubs"}, {Gallop:19197::"Gallop present: ***","No S3,S4"}  Vascular Vessel Right Left  Radial {Palpable:19197::"Not palpable","Faintly palpable","Palpable"} {Palpable:19197::"Not palpable","Faintly palpable","Palpable"}  Brachial {Palpable:19197::"Not palpable","Faintly palpable","Palpable"} {Palpable:19197::"Not palpable","Faintly palpable","Palpable"}  Carotid Palpable, {Bruit:19197::"Bruit present","No Bruit"} Palpable, {Bruit:19197::"Bruit present","No Bruit"}  Aorta Not palpable N/A  Femoral {Palpable:19197::"Not palpable","Faintly palpable","Palpable"} {Palpable:19197::"Not palpable","Faintly palpable","Palpable"}  Popliteal {Palpable:19197::"Prominently palpable","Not palpable"} {Palpable:19197::"Prominently palpable","Not palpable"}  PT {Palpable:19197::"Not palpable","Faintly palpable","Palpable"} {Palpable:19197::"Not palpable","Faintly palpable","Palpable"}  DP {Palpable:19197::"Not palpable","Faintly palpable","Palpable"} {Palpable:19197::"Not palpable","Faintly palpable","Palpable"}    Gastro- intestinal soft, {Distension:19197::"distended","non-distended"}, {TTP:19197::"TTP in *** quadrant","appropriate tenderness to palpation","non-tender to palpation"}, {Guarding:19197::"Guarding and rebound in *** quadrant","No guarding or rebound"}, {HSM:19197::"HSM present","no HSM"}, {Masses:19197::"Mass present: ***","no masses"},  {Flank:19197::"CVAT on ***","Flank bruit present on ***","no CVAT B"}, {AAA:19197::"Palpable prominent aortic pulse","No palpable prominent aortic pulse"}, {Surgical incision:19197::"Surg incisions: ***","Surgical incisions well healed"," "}  Musculo- skeletal M/S 5/5 throughout {MS:19197::"except ***"," "}, Extremities without ischemic changes {MS:19197::"except ***"," "}, {Edema:19197::"Pitting edema present: ***","Non-pitting edema present: ***","No edema present"}, {Varicosities:19197::"Varicosities present: ***","No visible varicosities "}, {LDS:19197::"Lipodermatosclerosis present: ***","No Lipodermatosclerosis present"}  Neurologic Cranial nerves 2-12 intact{CN:19197::" except ***"," "}, Pain and light touch intact in  extremities{CN:19197::" except for decreased sensation in ***"," "}, Motor exam as listed above    Non-Invasive Vascular Imaging   ABI (***)  R:   ABI: *** (***),   PT: {Signals:19197::"none","mono","bi","tri"}  DP: {Signals:19197::"none","mono","bi","tri"}  TBI:  ***  L:   ABI: *** (***),   PT: {Signals:19197::"none","mono","bi","tri"}  DP: {Signals:19197::"none","mono","bi","tri"}  TBI: ***   Medical Decision Making   [name] is a 83 y.o. male who presents with: ***critical limb ischemia with *** wounds.   Based on the patient's vascular studies and examination, I have offered the patient: ***.  I discussed in depth with the patient the nature of atherosclerosis, and emphasized the importance of maximal medical management including strict control of blood pressure, blood glucose, and lipid levels, antiplatelet agents, obtaining regular exercise, and cessation of smoking.    The patient is aware that without maximal medical management the underlying atherosclerotic disease process will progress, limiting the benefit of any interventions.  The patient is currently on a statin: Lipitor.   The patient is currently on an anti-platelet:  ASA.  Thank you for allowing [place] to participate in this patient's care.   Adele Barthel, MD, FACS [facility] Office: [phone] Pager: [phone]

## 2018-04-28 ENCOUNTER — Ambulatory Visit: Payer: Medicare HMO | Admitting: Vascular Surgery

## 2018-05-15 DIAGNOSIS — Z9289 Personal history of other medical treatment: Secondary | ICD-10-CM

## 2018-05-15 HISTORY — DX: Personal history of other medical treatment: Z92.89

## 2018-05-19 NOTE — H&P (View-Only) (Signed)
Established Critical Limb Ischemia Patient   History of Present Illness   [name] is a 83 y.o. ([birth year]) male who presents with chief complaint: severe pain in left foot.  This patient presented previously with slowly healing left plantar ulcers that were healing.  He also had intermittent claudication that was not lifestyle limiting.  He presented in the office with new onset bradycardia and oxygen desaturation and was subsequent referred to the ED by his PCP.  These issues have resolved since his prior visit.  The patient now has severe pain in left leg that improves with leg elevation.  The patient's PMH, PSH, SH, and FamHx were reviewed on 05/26/18 are unchanged from 01/08/18.  Current Outpatient Medications  Medication Sig Dispense Refill  . amLODipine (NORVASC) 5 MG tablet Take 5 mg by mouth daily.    Marland Kitchen aspirin 81 MG tablet Take 81 mg by mouth at bedtime.     Marland Kitchen atorvastatin (LIPITOR) 40 MG tablet Take 40 mg by mouth at bedtime.   3  . furosemide (LASIX) 40 MG tablet Take 1 tablet (40 mg total) by mouth 2 (two) times daily. 60 tablet 0  . insulin aspart (NOVOLOG) 100 UNIT/ML injection Inject 5-15 Units into the skin 3 (three) times daily as needed for high blood sugar (CBG >200).    . insulin glargine (LANTUS) 100 UNIT/ML injection Inject 40 Units into the skin 2 (two) times daily.    Marland Kitchen levothyroxine (SYNTHROID, LEVOTHROID) 50 MCG tablet Take 50 mcg by mouth daily.    . metoprolol succinate (TOPROL-XL) 25 MG 24 hr tablet Take 1 tablet (25 mg total) by mouth daily. 30 tablet 0  . Omega-3 Fatty Acids (FISH OIL PO) Take 2 capsules by mouth daily.     Marland Kitchen oxyCODONE-acetaminophen (PERCOCET) 10-325 MG tablet Take 1 tablet by mouth every 6 (six) hours as needed for pain.   0   No current facility-administered medications for this visit.     On ROS today: continue plantar ulcers, LLE severe pain   Physical Examination   Vitals:   05/26/18 0841  BP: (!) 153/64  Pulse: 62    Temp: (!) 97.3 F (36.3 C)  Weight: 191 lb (86.6 kg)  Height: 5\' 5"  (1.651 m)   Body mass index is 31.78 kg/m.  General Alert, O x 3, WD, NAD  Pulmonary Sym exp, good B air movt, CTA B  Cardiac RRR, Nl S1, S2, no Murmurs, No rubs, No S3,S4  Vascular Vessel Right Left  Radial Palpable Palpable  Brachial Palpable Palpable  Carotid Palpable, No Bruit Palpable, No Bruit  Aorta Not palpable N/A  Femoral Palpable Palpable  Popliteal Not palpable Not palpable  PT Not palpable Not palpable  DP Not palpable Not palpable    Gastro- intestinal soft, non-distended, non-tender to palpation, No guarding or rebound, no HSM, no masses, no CVAT B, No palpable prominent aortic pulse,    Musculo- skeletal M/S 5/5 throughout  , Extremities without ischemic changes  , Non-pitting edema present: 1-2+ B, Varicosities present: B, No Lipodermatosclerosis present, plantar ulcer overlying 1st/2nd MT with silvadene superficially, +granulation evident, forefoot erythema  Neurologic Cranial nerves grossly intact , Pain and light touch intact in extremities , Motor exam as listed above    Medical Decision Making   [name] is a 83 y.o. male who presents with: LLE critical limb ischemia with persistent left plantar wounds, likely LLE CVI, LLE pain   Arterial rest pain worsens with leg  elevation.  This patient's sx improve with elevation.  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  Based on the patient's vascular studies and examination, I have offered the patient: Aortogram with carbon dioxide, Left leg runoff with carbon dioxide and IV contrast, possible left leg intervention. I discussed with the patient the nature of angiographic procedures, especially the limited patencies of any endovascular intervention.   The patient is aware of that the risks of an angiographic procedure include but are not limited to: bleeding,  infection, access site complications, renal failure, embolization, rupture of vessel, dissection, arteriovenous fistula, possible need for emergent surgical intervention, possible need for surgical procedures to treat the patient's pathology, anaphylactic reaction to contrast, and stroke and death.   The patient is aware of the risks and agrees to proceed.  He scheduled for the 20 JUN 19  I discussed in depth with the patient the nature of atherosclerosis, and emphasized the importance of maximal medical management including strict control of blood pressure, blood glucose, and lipid levels, antiplatelet agents, obtaining regular exercise, and cessation of smoking.    The patient is aware that without maximal medical management the underlying atherosclerotic disease process will progress, limiting the benefit of any interventions.  The patient is currently on a statin: Lipitor.   The patient is currently on an anti-platelet: ASA.  Thank you for allowing [place] to participate in this patient's care.   Adele Barthel, MD, FACS [facility] Office: [phone] Pager: [phone]

## 2018-05-19 NOTE — Progress Notes (Signed)
Established Critical Limb Ischemia Patient   History of Present Illness   [name] is a 83 y.o. ([birth year]) male who presents with chief complaint: severe pain in left foot.  This patient presented previously with slowly healing left plantar ulcers that were healing.  He also had intermittent claudication that was not lifestyle limiting.  He presented in the office with new onset bradycardia and oxygen desaturation and was subsequent referred to the ED by his PCP.  These issues have resolved since his prior visit.  The patient now has severe pain in left leg that improves with leg elevation.  The patient's PMH, PSH, SH, and FamHx were reviewed on 05/26/18 are unchanged from 01/08/18.  Current Outpatient Medications  Medication Sig Dispense Refill  . amLODipine (NORVASC) 5 MG tablet Take 5 mg by mouth daily.    Marland Kitchen aspirin 81 MG tablet Take 81 mg by mouth at bedtime.     Marland Kitchen atorvastatin (LIPITOR) 40 MG tablet Take 40 mg by mouth at bedtime.   3  . furosemide (LASIX) 40 MG tablet Take 1 tablet (40 mg total) by mouth 2 (two) times daily. 60 tablet 0  . insulin aspart (NOVOLOG) 100 UNIT/ML injection Inject 5-15 Units into the skin 3 (three) times daily as needed for high blood sugar (CBG >200).    . insulin glargine (LANTUS) 100 UNIT/ML injection Inject 40 Units into the skin 2 (two) times daily.    Marland Kitchen levothyroxine (SYNTHROID, LEVOTHROID) 50 MCG tablet Take 50 mcg by mouth daily.    . metoprolol succinate (TOPROL-XL) 25 MG 24 hr tablet Take 1 tablet (25 mg total) by mouth daily. 30 tablet 0  . Omega-3 Fatty Acids (FISH OIL PO) Take 2 capsules by mouth daily.     Marland Kitchen oxyCODONE-acetaminophen (PERCOCET) 10-325 MG tablet Take 1 tablet by mouth every 6 (six) hours as needed for pain.   0   No current facility-administered medications for this visit.     On ROS today: continue plantar ulcers, LLE severe pain   Physical Examination   Vitals:   05/26/18 0841  BP: (!) 153/64  Pulse: 62    Temp: (!) 97.3 F (36.3 C)  Weight: 191 lb (86.6 kg)  Height: 5\' 5"  (1.651 m)   Body mass index is 31.78 kg/m.  General Alert, O x 3, WD, NAD  Pulmonary Sym exp, good B air movt, CTA B  Cardiac RRR, Nl S1, S2, no Murmurs, No rubs, No S3,S4  Vascular Vessel Right Left  Radial Palpable Palpable  Brachial Palpable Palpable  Carotid Palpable, No Bruit Palpable, No Bruit  Aorta Not palpable N/A  Femoral Palpable Palpable  Popliteal Not palpable Not palpable  PT Not palpable Not palpable  DP Not palpable Not palpable    Gastro- intestinal soft, non-distended, non-tender to palpation, No guarding or rebound, no HSM, no masses, no CVAT B, No palpable prominent aortic pulse,    Musculo- skeletal M/S 5/5 throughout  , Extremities without ischemic changes  , Non-pitting edema present: 1-2+ B, Varicosities present: B, No Lipodermatosclerosis present, plantar ulcer overlying 1st/2nd MT with silvadene superficially, +granulation evident, forefoot erythema  Neurologic Cranial nerves grossly intact , Pain and light touch intact in extremities , Motor exam as listed above    Medical Decision Making   DONIVAN THAMMAVONG is a 83 y.o. male who presents with: LLE critical limb ischemia with persistent left plantar wounds, likely LLE CVI, LLE pain   Arterial rest pain worsens with leg  elevation.  This patient's sx improve with elevation.  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  Based on the patient's vascular studies and examination, I have offered the patient: Aortogram with carbon dioxide, Left leg runoff with carbon dioxide and IV contrast, possible left leg intervention. I discussed with the patient the nature of angiographic procedures, especially the limited patencies of any endovascular intervention.   The patient is aware of that the risks of an angiographic procedure include but are not limited to: bleeding,  infection, access site complications, renal failure, embolization, rupture of vessel, dissection, arteriovenous fistula, possible need for emergent surgical intervention, possible need for surgical procedures to treat the patient's pathology, anaphylactic reaction to contrast, and stroke and death.   The patient is aware of the risks and agrees to proceed.  He scheduled for the 20 JUN 19  I discussed in depth with the patient the nature of atherosclerosis, and emphasized the importance of maximal medical management including strict control of blood pressure, blood glucose, and lipid levels, antiplatelet agents, obtaining regular exercise, and cessation of smoking.    The patient is aware that without maximal medical management the underlying atherosclerotic disease process will progress, limiting the benefit of any interventions.  The patient is currently on a statin: Lipitor.   The patient is currently on an anti-platelet: ASA.  Thank you for allowing [place] to participate in this patient's care.   Adele Barthel, MD, FACS [facility] Office: [phone] Pager: [phone]

## 2018-05-26 ENCOUNTER — Encounter: Payer: Self-pay | Admitting: Vascular Surgery

## 2018-05-26 ENCOUNTER — Other Ambulatory Visit: Payer: Self-pay | Admitting: *Deleted

## 2018-05-26 ENCOUNTER — Ambulatory Visit: Payer: Medicare HMO | Admitting: Vascular Surgery

## 2018-05-26 ENCOUNTER — Encounter: Payer: Self-pay | Admitting: *Deleted

## 2018-05-26 DIAGNOSIS — I7025 Atherosclerosis of native arteries of other extremities with ulceration: Secondary | ICD-10-CM | POA: Insufficient documentation

## 2018-05-28 NOTE — Pre-Procedure Instructions (Signed)
[name]  05/28/2018      [facility] - [address], [place], [place] - [address] [place] [address] Phone: [phone] Fax: [phone]    Your procedure is scheduled on    Thursday 06/03/18  Report to [facility] at 1050 A.M.  Call this number if you have problems the morning of surgery:  [phone]   Remember:  Do not eat or drink after midnight.      Take these medicines the morning of surgery with A SIP OF WATER -       How to Manage Your Diabetes Before and After Surgery  Why is it important to control my blood sugar before and after surgery? . Improving blood sugar levels before and after surgery helps healing and can limit problems. . A way of improving blood sugar control is eating a healthy diet by: o  Eating less sugar and carbohydrates o  Increasing activity/exercise o  Talking with your doctor about reaching your blood sugar goals . High blood sugars (greater than 180 mg/dL) can raise your risk of infections and slow your recovery, so you will need to focus on controlling your diabetes during the weeks before surgery. . Make sure that the doctor who takes care of your diabetes knows about your planned surgery including the date and location.  How do I manage my blood sugar before surgery? . Check your blood sugar at least 4 times a day, starting 2 days before surgery, to make sure that the level is not too high or low. o Check your blood sugar the morning of your surgery when you wake up and every 2 hours until you get to the Short Stay unit. . If your blood sugar is less than 70 mg/dL, you will need to treat for low blood sugar: o Do not take insulin. o Treat a low blood sugar (less than 70 mg/dL) with  cup of clear juice (cranberry or apple), 4 glucose tablets, OR glucose gel. Recheck blood sugar in 15 minutes after treatment (to make sure it is greater than 70 mg/dL). If your blood sugar is not greater  than 70 mg/dL on recheck, call [phone] o  for further instructions. . Report your blood sugar to the short stay nurse when you get to Short Stay.  . If you are admitted to the hospital after surgery: o Your blood sugar will be checked by the staff and you will probably be given insulin after surgery (instead of oral diabetes medicines) to make sure you have good blood sugar levels. o The goal for blood sugar control after surgery is 80-180 mg/dL.              WHAT DO I DO ABOUT MY DIABETES MEDICATION?   Marland Kitchen Do not take oral diabetes medicines (pills) the morning of surgery.  . THE NIGHT BEFORE SURGERY, take ___20units________ units of ______LANTUS_____insulin.       . THE MORNING OF SURGERY, take ____20_________ units of ______LANTUS____insulin.  . The day of surgery, do not take other diabetes injectables, including Byetta (exenatide), Bydureon (exenatide ER), Victoza (liraglutide), or Trulicity (dulaglutide).  . If your CBG is greater than 220 mg/dL, you may take  of your sliding scale (correction) dose of insulin.  Other Instructions:          Patient Signature:  Date:   Nurse Signature:  Date:   Reviewed and Endorsed by [facility] Patient Education Committee,  August 2015  Do not wear jewelry, make-up or nail polish.  Do not wear lotions, powders, or perfumes, or deodorant.  Do not shave 48 hours prior to surgery.  Men may shave face and neck.  Do not bring valuables to the hospital.  [facility] is not responsible for any belongings or valuables.  Contacts, dentures or bridgework may not be worn into surgery.  Leave your suitcase in the car.  After surgery it may be brought to your room.  For patients admitted to the hospital, discharge time will be determined by your treatment team.  Patients discharged the day of surgery will not be allowed to drive home.   Name and phone number of your driver:    Special instructions:  [facility] - Preparing for  Surgery  Before surgery, you can play an important role.  Because skin is not sterile, your skin needs to be as free of germs as possible.  You can reduce the number of germs on you skin by washing with CHG (chlorahexidine gluconate) soap before surgery.  CHG is an antiseptic cleaner which kills germs and bonds with the skin to continue killing germs even after washing.  Oral Hygiene is also important in reducing the risk of infection.  Remember to brush your teeth with your regular toothpaste the morning of surgery.  Please DO NOT use if you have an allergy to CHG or antibacterial soaps.  If your skin becomes reddened/irritated stop using the CHG and inform your nurse when you arrive at Short Stay.  Do not shave (including legs and underarms) for at least 48 hours prior to the first CHG shower.  You may shave your face.  Please follow these instructions carefully:   1.  Shower with CHG Soap the night before surgery and the morning of Surgery.  2.  If you choose to wash your hair, wash your hair first as usual with your normal shampoo.  3.  After you shampoo, rinse your hair and body thoroughly to remove the shampoo. 4.  Use CHG as you would any other liquid soap.  You can apply chg directly to the skin and wash gently with a      scrungie or washcloth.           5.  Apply the CHG Soap to your body ONLY FROM THE NECK DOWN.   Do not use on open wounds or open sores. Avoid contact with your eyes, ears, mouth and genitals (private parts).  Wash genitals (private parts) with your normal soap.  6.  Wash thoroughly, paying special attention to the area where your surgery will be performed.  7.  Thoroughly rinse your body with warm water from the neck down.  8.  DO NOT shower/wash with your normal soap after using and rinsing off the CHG Soap.  9.  Pat yourself dry with a clean towel.            10.  Wear clean pajamas.            11.  Place clean sheets on your bed the night of your first shower and  do not sleep with pets.  Day of Surgery  Do not apply any lotions/deoderants the morning of surgery.   Please wear clean clothes to the hospital/surgery center. Remember to brush your teeth with toothpaste.     Please read over the following fact sheets that you were given. MRSA Information and Surgical Site Infection Prevention

## 2018-05-31 ENCOUNTER — Encounter (HOSPITAL_COMMUNITY)
Admission: RE | Admit: 2018-05-31 | Discharge: 2018-05-31 | Disposition: A | Discharge status: Home / Self Care | Payer: Medicare HMO | Source: Ambulatory Visit | Attending: Vascular Surgery | Admitting: Vascular Surgery

## 2018-05-31 ENCOUNTER — Other Ambulatory Visit: Payer: Self-pay

## 2018-05-31 ENCOUNTER — Encounter (HOSPITAL_COMMUNITY): Payer: Self-pay

## 2018-05-31 DIAGNOSIS — I10 Essential (primary) hypertension: Secondary | ICD-10-CM | POA: Diagnosis not present

## 2018-05-31 DIAGNOSIS — E11621 Type 2 diabetes mellitus with foot ulcer: Secondary | ICD-10-CM | POA: Diagnosis not present

## 2018-05-31 DIAGNOSIS — I70245 Atherosclerosis of native arteries of left leg with ulceration of other part of foot: Secondary | ICD-10-CM | POA: Diagnosis not present

## 2018-05-31 DIAGNOSIS — L97529 Non-pressure chronic ulcer of other part of left foot with unspecified severity: Secondary | ICD-10-CM | POA: Diagnosis not present

## 2018-05-31 DIAGNOSIS — I7092 Chronic total occlusion of artery of the extremities: Secondary | ICD-10-CM | POA: Diagnosis not present

## 2018-05-31 DIAGNOSIS — Z01812 Encounter for preprocedural laboratory examination: Secondary | ICD-10-CM | POA: Insufficient documentation

## 2018-05-31 DIAGNOSIS — Z794 Long term (current) use of insulin: Secondary | ICD-10-CM | POA: Diagnosis not present

## 2018-05-31 DIAGNOSIS — I739 Peripheral vascular disease, unspecified: Secondary | ICD-10-CM | POA: Diagnosis present

## 2018-05-31 DIAGNOSIS — E1151 Type 2 diabetes mellitus with diabetic peripheral angiopathy without gangrene: Secondary | ICD-10-CM | POA: Diagnosis not present

## 2018-05-31 DIAGNOSIS — Z7982 Long term (current) use of aspirin: Secondary | ICD-10-CM | POA: Diagnosis not present

## 2018-05-31 HISTORY — DX: Hypothyroidism, unspecified: E03.9

## 2018-05-31 HISTORY — DX: Acute myocardial infarction, unspecified: I21.9

## 2018-05-31 HISTORY — DX: Heart failure, unspecified: I50.9

## 2018-05-31 HISTORY — DX: Cardiac arrhythmia, unspecified: I49.9

## 2018-05-31 LAB — GLUCOSE, CAPILLARY: GLUCOSE-CAPILLARY: 156 mg/dL — AB (ref 65–99)

## 2018-05-31 LAB — HEMOGLOBIN A1C
Hgb A1c MFr Bld: 8.3 % — ABNORMAL HIGH (ref 4.8–5.6)
Mean Plasma Glucose: 191.51 mg/dL

## 2018-05-31 NOTE — Pre-Procedure Instructions (Signed)
[name]  05/31/2018      [facility] - [address], [place], [place] - [address] [address] Phone: [phone] Fax: [phone]    Your procedure is scheduled on June 20th 2019.  Report to [facility] Admitting at 10:50 A.M.  Call this number if you have problems the morning of surgery:  [phone]   Remember:  Do not eat or drink after midnight.   Take these medicines the morning of surgery with A SIP OF WATER: amLODipine (NORVASC) 5 MG tablet levothyroxine (SYNTHROID, LEVOTHROID) 50 MCG tablet oxyCODONE-acetaminophen (PERCOCET) 10-325 MG tablet  7 days prior to surgery STOP taking any Aspirin(unless otherwise instructed by your surgeon), Aleve, Naproxen, Ibuprofen, Motrin, Advil, Goody's, BC's, all herbal medications, fish oil, and all vitamins   WHAT DO I DO ABOUT MY DIABETES MEDICATION?   Marland Kitchen Do not take oral diabetes medicines (pills) the morning of surgery.  . THE NIGHT BEFORE SURGERY, take ______20_____ units of ______lantus_____insulin.       . THE MORNING OF SURGERY, take ______20_______ units of ______lantus____insulin.  . The day of surgery, do not take other diabetes injectables, including Byetta (exenatide), Bydureon (exenatide ER), Victoza (liraglutide), or Trulicity (dulaglutide).  . If your CBG is greater than 220 mg/dL, you may take  of your sliding scale (correction) dose of insulin.   How to Manage Your Diabetes Before and After Surgery  Why is it important to control my blood sugar before and after surgery? . Improving blood sugar levels before and after surgery helps healing and can limit problems. . A way of improving blood sugar control is eating a healthy diet by: o  Eating less sugar and carbohydrates o  Increasing activity/exercise o  Talking with your doctor about reaching your blood sugar goals . High blood sugars (greater than 180 mg/dL) can raise your risk of infections and slow your  recovery, so you will need to focus on controlling your diabetes during the weeks before surgery. . Make sure that the doctor who takes care of your diabetes knows about your planned surgery including the date and location.  How do I manage my blood sugar before surgery? . Check your blood sugar at least 4 times a day, starting 2 days before surgery, to make sure that the level is not too high or low. o Check your blood sugar the morning of your surgery when you wake up and every 2 hours until you get to the Short Stay unit. . If your blood sugar is less than 70 mg/dL, you will need to treat for low blood sugar: o Do not take insulin. o Treat a low blood sugar (less than 70 mg/dL) with  cup of clear juice (cranberry or apple), 4 glucose tablets, OR glucose gel. o Recheck blood sugar in 15 minutes after treatment (to make sure it is greater than 70 mg/dL). If your blood sugar is not greater than 70 mg/dL on recheck, call [phone] for further instructions. . Report your blood sugar to the short stay nurse when you get to Short Stay.  . If you are admitted to the hospital after surgery: o Your blood sugar will be checked by the staff and you will probably be given insulin after surgery (instead of oral diabetes medicines) to make sure you have good blood sugar levels. o The goal for blood sugar control after surgery is 80-180 mg/dL.      Do not wear jewelry,  make-up or nail polish.  Do not wear lotions, powders, or perfumes, or deodorant.  Do not shave 48 hours prior to surgery.  Men may shave face and neck.  Do not bring valuables to the hospital.  [facility] is not responsible for any belongings or valuables.  Contacts, dentures or bridgework may not be worn into surgery.  Leave your suitcase in the car.  After surgery it may be brought to your room.  For patients admitted to the hospital, discharge time will be determined by your treatment team.  Patients discharged the day of  surgery will not be allowed to drive home.    Please read over the following fact sheets that you were given.

## 2018-05-31 NOTE — Progress Notes (Signed)
PCP - Dr. Patrecia Pour Cardiologist - patient denies  Chest x-ray - N/A EKG -01/10/2018  Stress Test - 2004 ECHO - 12/23/2017 Cardiac Cath - 2010  Sleep Study - patient denies  Fasting Blood Sugar - 156 Checks Blood Sugar ___3__ times a day  Blood Thinner Instructions: ASA Aspirin Instructions: Stop taking ASA prior to procedure.   Anesthesia review: No  Patient denies shortness of breath, fever, cough and chest pain at PAT appointment   Patient verbalized understanding of instructions that were given to them at the PAT appointment. Patient was also instructed that they will need to review over the PAT instructions again at home before surgery.   Jacqlyn Larsen, RN

## 2018-06-03 ENCOUNTER — Encounter (HOSPITAL_COMMUNITY): Payer: Self-pay | Admitting: General Practice

## 2018-06-03 ENCOUNTER — Ambulatory Visit (HOSPITAL_COMMUNITY)
Admission: RE | Admit: 2018-06-03 | Discharge: 2018-06-03 | Disposition: A | Discharge status: Home / Self Care | Payer: Medicare HMO | Source: Ambulatory Visit | Attending: Vascular Surgery | Admitting: Vascular Surgery

## 2018-06-03 ENCOUNTER — Encounter (HOSPITAL_COMMUNITY)
Admission: RE | Disposition: A | Discharge status: Home / Self Care | Payer: Self-pay | Source: Ambulatory Visit | Attending: Vascular Surgery

## 2018-06-03 ENCOUNTER — Ambulatory Visit (HOSPITAL_COMMUNITY): Payer: Medicare HMO | Admitting: Certified Registered Nurse Anesthetist

## 2018-06-03 ENCOUNTER — Other Ambulatory Visit: Payer: Self-pay

## 2018-06-03 DIAGNOSIS — I70245 Atherosclerosis of native arteries of left leg with ulceration of other part of foot: Secondary | ICD-10-CM | POA: Insufficient documentation

## 2018-06-03 DIAGNOSIS — I10 Essential (primary) hypertension: Secondary | ICD-10-CM | POA: Insufficient documentation

## 2018-06-03 DIAGNOSIS — I7092 Chronic total occlusion of artery of the extremities: Secondary | ICD-10-CM | POA: Diagnosis not present

## 2018-06-03 DIAGNOSIS — Z794 Long term (current) use of insulin: Secondary | ICD-10-CM | POA: Insufficient documentation

## 2018-06-03 DIAGNOSIS — L97529 Non-pressure chronic ulcer of other part of left foot with unspecified severity: Secondary | ICD-10-CM | POA: Diagnosis not present

## 2018-06-03 DIAGNOSIS — E11621 Type 2 diabetes mellitus with foot ulcer: Secondary | ICD-10-CM | POA: Insufficient documentation

## 2018-06-03 DIAGNOSIS — E1151 Type 2 diabetes mellitus with diabetic peripheral angiopathy without gangrene: Secondary | ICD-10-CM | POA: Insufficient documentation

## 2018-06-03 DIAGNOSIS — Z7982 Long term (current) use of aspirin: Secondary | ICD-10-CM | POA: Insufficient documentation

## 2018-06-03 HISTORY — PX: AORTOGRAM: SHX6300

## 2018-06-03 LAB — POCT I-STAT, CHEM 8
BUN: 53 mg/dL — ABNORMAL HIGH (ref 6–20)
CALCIUM ION: 1.12 mmol/L — AB (ref 1.15–1.40)
Chloride: 100 mmol/L — ABNORMAL LOW (ref 101–111)
Creatinine, Ser: 2.6 mg/dL — ABNORMAL HIGH (ref 0.61–1.24)
GLUCOSE: 99 mg/dL (ref 65–99)
HCT: 30 % — ABNORMAL LOW (ref 39.0–52.0)
HEMOGLOBIN: 10.2 g/dL — AB (ref 13.0–17.0)
Potassium: 4.3 mmol/L (ref 3.5–5.1)
Sodium: 137 mmol/L (ref 135–145)
TCO2: 26 mmol/L (ref 22–32)

## 2018-06-03 LAB — GLUCOSE, CAPILLARY
GLUCOSE-CAPILLARY: 99 mg/dL (ref 65–99)
GLUCOSE-CAPILLARY: 90 mg/dL (ref 65–99)

## 2018-06-03 SURGERY — AORTOGRAM
Anesthesia: Choice | Site: Abdomen | Laterality: Left

## 2018-06-03 MED ORDER — ONDANSETRON HCL 4 MG/2ML IJ SOLN
4.0000 mg | Freq: Four times a day (QID) | INTRAMUSCULAR | Status: DC | PRN
  Filled 2018-06-03: qty 2

## 2018-06-03 MED ORDER — SODIUM CHLORIDE 0.9% FLUSH: 3.0000 mL | INTRAVENOUS | Status: DC | PRN

## 2018-06-03 MED ORDER — ACETAMINOPHEN 325 MG PO TABS
650.0000 mg | ORAL_TABLET | ORAL | Status: DC | PRN
  Filled 2018-06-03: qty 2

## 2018-06-03 MED ORDER — LIDOCAINE HCL (PF) 1 % IJ SOLN
INTRAMUSCULAR | Status: DC | PRN
  Administered 2018-06-03: 7.5 mL

## 2018-06-03 MED ORDER — SODIUM CHLORIDE 0.9 % IV SOLN
INTRAVENOUS | Status: AC
  Filled 2018-06-03: qty 1.2

## 2018-06-03 MED ORDER — SODIUM CHLORIDE 0.9 % IV SOLN: 250.0000 mL | INTRAVENOUS | Status: DC | PRN

## 2018-06-03 MED ORDER — LIDOCAINE HCL (PF) 1 % IJ SOLN
INTRAMUSCULAR | Status: AC
  Filled 2018-06-03: qty 30

## 2018-06-03 MED ORDER — SODIUM CHLORIDE 0.9% FLUSH: 3.0000 mL | Freq: Two times a day (BID) | INTRAVENOUS | Status: DC

## 2018-06-03 MED ORDER — ONDANSETRON HCL 4 MG/2ML IJ SOLN: 4.0000 mg | Freq: Once | INTRAMUSCULAR | Status: DC | PRN

## 2018-06-03 MED ORDER — SODIUM CHLORIDE 0.9 % WEIGHT BASED INFUSION: 1.0000 mL/kg/h | INTRAVENOUS | Status: DC

## 2018-06-03 MED ORDER — FENTANYL CITRATE (PF) 250 MCG/5ML IJ SOLN
INTRAMUSCULAR | Status: AC
  Filled 2018-06-03: qty 5

## 2018-06-03 MED ORDER — SODIUM CHLORIDE 0.9 % IV SOLN
INTRAVENOUS | Status: DC | PRN
  Administered 2018-06-03: 13:00:00

## 2018-06-03 MED ORDER — ONDANSETRON HCL 4 MG/2ML IJ SOLN
INTRAMUSCULAR | Status: DC | PRN
  Administered 2018-06-03: 4 mg via INTRAVENOUS

## 2018-06-03 MED ORDER — OXYCODONE HCL 5 MG PO TABS
5.0000 mg | ORAL_TABLET | ORAL | Status: DC | PRN
  Administered 2018-06-03: 5 mg via ORAL
  Filled 2018-06-03 (×2): qty 1

## 2018-06-03 MED ORDER — IODIXANOL 320 MG/ML IV SOLN
INTRAVENOUS | Status: DC | PRN
  Administered 2018-06-03: 30 mL via INTRA_ARTERIAL

## 2018-06-03 MED ORDER — PROPOFOL 500 MG/50ML IV EMUL
INTRAVENOUS | Status: DC | PRN
  Administered 2018-06-03: 75 ug/kg/min via INTRAVENOUS

## 2018-06-03 MED ORDER — FENTANYL CITRATE (PF) 100 MCG/2ML IJ SOLN: 25.0000 ug | INTRAMUSCULAR | Status: DC | PRN

## 2018-06-03 MED ORDER — SODIUM CHLORIDE 0.9 % IV SOLN
INTRAVENOUS | Status: DC
  Administered 2018-06-03 (×2): via INTRAVENOUS

## 2018-06-03 MED ORDER — PHENYLEPHRINE HCL 10 MG/ML IJ SOLN
INTRAVENOUS | Status: DC | PRN
  Administered 2018-06-03: 10 ug/min via INTRAVENOUS

## 2018-06-03 MED ORDER — LABETALOL HCL 5 MG/ML IV SOLN
10.0000 mg | INTRAVENOUS | Status: DC | PRN
  Filled 2018-06-03: qty 4

## 2018-06-03 MED ORDER — HYDRALAZINE HCL 20 MG/ML IJ SOLN
5.0000 mg | INTRAMUSCULAR | Status: DC | PRN
  Filled 2018-06-03: qty 0.25

## 2018-06-03 MED ORDER — PROPOFOL 10 MG/ML IV BOLUS
INTRAVENOUS | Status: DC | PRN
  Administered 2018-06-03: 20 mg via INTRAVENOUS

## 2018-06-03 SURGICAL SUPPLY — 58 items
BAG BANDED W/RUBBER/TAPE 36X54 (MISCELLANEOUS) ×3 IMPLANT
BAG EQP BAND 135X91 W/RBR TAPE (MISCELLANEOUS) ×1
BLADE SURG 11 STRL SS (BLADE) ×3 IMPLANT
BLADE SURG 15 STRL LF DISP TIS (BLADE) ×1 IMPLANT
BLADE SURG 15 STRL SS (BLADE) ×3
CANISTER SUCT 3000ML PPV (MISCELLANEOUS) ×3 IMPLANT
CATH ANGIO 5F BER 65CM (CATHETERS) IMPLANT
CATH ANGIO 5F BER2 100CM (CATHETERS) ×2 IMPLANT
CATH OMNI FLUSH .035X70CM (CATHETERS) ×1 IMPLANT
CATH OMNI FLUSH 5F 65CM (CATHETERS) ×2 IMPLANT
CATH STRAIGHT 5FR 65CM (CATHETERS) ×2 IMPLANT
COVER BACK TABLE 80X110 HD (DRAPES) ×6 IMPLANT
COVER DOME SNAP 22 D (MISCELLANEOUS) ×3 IMPLANT
COVER PROBE W GEL 5X96 (DRAPES) ×3 IMPLANT
COVER SURGICAL LIGHT HANDLE (MISCELLANEOUS) ×3 IMPLANT
DEVICE TORQUE KENDALL .025-038 (MISCELLANEOUS) ×2 IMPLANT
DRAPE FEMORAL ANGIO 80X135IN (DRAPES) ×3 IMPLANT
DRSG TEGADERM 2-3/8X2-3/4 SM (GAUZE/BANDAGES/DRESSINGS) ×2 IMPLANT
ELECT REM PT RETURN 9FT ADLT (ELECTROSURGICAL)
ELECTRODE REM PT RTRN 9FT ADLT (ELECTROSURGICAL) IMPLANT
FILTER CO2 0.2 MICRON (VASCULAR PRODUCTS) ×2 IMPLANT
FILTER CO2 INSUFFLATOR AX1008 (MISCELLANEOUS) ×2 IMPLANT
GAUZE SPONGE 2X2 8PLY STRL LF (GAUZE/BANDAGES/DRESSINGS) IMPLANT
GAUZE SPONGE 4X4 16PLY XRAY LF (GAUZE/BANDAGES/DRESSINGS) ×3 IMPLANT
GLOVE BIO SURGEON STRL SZ7 (GLOVE) ×3 IMPLANT
GLOVE BIOGEL PI IND STRL 6.5 (GLOVE) IMPLANT
GLOVE BIOGEL PI INDICATOR 6.5 (GLOVE) ×2
GUIDEWIRE ANGLED .035X150CM (WIRE) ×2 IMPLANT
GUIDEWIRE ANGLED .035X260CM (WIRE) ×2 IMPLANT
INTRODUCER AVANTI 5FR (MISCELLANEOUS) ×2 IMPLANT
KIT BASIN OR (CUSTOM PROCEDURE TRAY) ×3 IMPLANT
KIT TURNOVER KIT B (KITS) ×3 IMPLANT
NDL HYPO 25GX1X1/2 BEV (NEEDLE) IMPLANT
NDL PERC 18GX7CM (NEEDLE) ×1 IMPLANT
NEEDLE HYPO 25GX1X1/2 BEV (NEEDLE) ×3 IMPLANT
NEEDLE PERC 18GX7CM (NEEDLE) ×3 IMPLANT
NS IRRIG 1000ML POUR BTL (IV SOLUTION) ×3 IMPLANT
PACK PERIPHERAL VASCULAR (CUSTOM PROCEDURE TRAY) IMPLANT
PACK SURGICAL SETUP 50X90 (CUSTOM PROCEDURE TRAY) ×2 IMPLANT
PAD ARMBOARD 7.5X6 YLW CONV (MISCELLANEOUS) ×6 IMPLANT
PROTECTION STATION PRESSURIZED (MISCELLANEOUS) ×3
SET FLUSH CO2 (MISCELLANEOUS) ×2 IMPLANT
SET MICROPUNCTURE 5F STIFF (MISCELLANEOUS) ×2 IMPLANT
SHEATH AVANTI 11CM 5FR (SHEATH) IMPLANT
SHEATH HIGHFLEX ANSEL 6FRX55 (SHEATH) ×2 IMPLANT
SPONGE GAUZE 2X2 STER 10/PKG (GAUZE/BANDAGES/DRESSINGS) ×2
STATION PROTECTION PRESSURIZED (MISCELLANEOUS) ×1 IMPLANT
STOPCOCK MORSE 400PSI 3WAY (MISCELLANEOUS) ×2 IMPLANT
SYR 10ML LL (SYRINGE) ×9 IMPLANT
SYR 30ML LL (SYRINGE) IMPLANT
SYR CONTROL 10ML LL (SYRINGE) ×2 IMPLANT
SYR MEDRAD MARK V 150ML (SYRINGE) IMPLANT
SYRINGE 20CC LL (MISCELLANEOUS) ×6 IMPLANT
TOWEL GREEN STERILE (TOWEL DISPOSABLE) ×6 IMPLANT
TUBING HIGH PRESSURE 120CM (CONNECTOR) IMPLANT
WATER STERILE IRR 1000ML POUR (IV SOLUTION) IMPLANT
WIRE BENTSON .035X145CM (WIRE) ×5 IMPLANT
WIRE ROSEN-J .035X260CM (WIRE) ×2 IMPLANT

## 2018-06-03 NOTE — Op Note (Signed)
OPERATIVE NOTE   PROCEDURE: 1.  Right common femoral artery cannulation under ultrasound guidance 2.  Placement of catheter in aorta 3.  Aortogram with carbon dioxide 4.  Third order arterial selection 5.  Left leg runoff via catheter  PRE-OPERATIVE DIAGNOSIS: left foot ulcers  POST-OPERATIVE DIAGNOSIS: same as above   SURGEON: Adele Barthel, MD  ANESTHESIA: conscious sedation  ESTIMATED BLOOD LOSS: 50 cc  CONTRAST: 30 cc IV contrast, 180 cc carbon dioxide  FINDING(S):  Aorta: patent   Right Left  CIA patent patent  EIA patent patent  IIA patent patent  CFA  patent  SFA  Patent proximally, distally occluded with chronic calcified plaque  PFA  patent  Pop  Proximal segment occluded, collateral reconstitute at-the-knee segment with patent below-the-knee popliteal target  Trif  patent  AT  occluded  Pero  Patent, only runoff  PT  occluded   SPECIMEN(S):  none  INDICATIONS:   [name] is a 83 y.o. male who presents with chronic left foot plantar ulcers.  The patient presents for: aortogram with carbon dioxide and left leg runoff with combined carbon dioxide and contrast, and possible intervention.  I discussed with the patient the nature of angiographic procedures, especially the limited patencies of any endovascular intervention.  The patient is aware of that the risks of an angiographic procedure include but are not limited to: bleeding, infection, access site complications, renal failure, embolization, rupture of vessel, dissection, possible need for emergent surgical intervention, possible need for surgical procedures to treat the patient's pathology, and stroke and death.  The patient is aware of the risks and agrees to proceed.  DESCRIPTION: After full informed consent was obtained from the patient, the patient was brought back to the angiography suite.  The patient was placed supine upon the angiography table and connected to cardiopulmonary monitoring  equipment.  The patient was then given conscious sedation, the amounts of which are documented in the patient's chart.  A circulating radiologic technician maintained continuous monitoring of the patient's cardiopulmonary status.  Additionally, the control room radiologic technician provided backup monitoring throughout the procedure.  The patient was prepped and drape in the standard fashion for an angiographic procedure.  At this point, attention was turned to the right groin.  Under ultrasound guidance, the subcutaneous tissue surrounding the right common femoral artery was anesthesized with 1% lidocaine with epinephrine.  Under Sonosite guidance, the patency of the artery was noted to be: patent with calcified circumference.  Ultrasound image was permanently recorded.  The artery was then cannulated with a micropuncture needle.  The microwire was advanced into the iliac arterial system.  The needle was exchanged for a microsheath, which was loaded into the common femoral artery over the wire.  The microwire was exchanged for a Bentson wire which was advanced into the aorta.  The microsheath was then exchanged for a 5-Fr sheath which was loaded into the common femoral artery.  The Omniflush catheter was then loaded over the wire up to the level of T11/12.  The catheter was connected to the carbon dioxide circuit.  After de-airring and de-clotting the circuit, a carbon dioxide aortogram was completed.  The findings are listed above.  Using a Glidewire wire and Omniflush catheter, the left common iliac artery was selected.  The catheter and wire were advanced into the external iliac artery.  I did a hand injection to image the distal left external iliac artery and proximal femoral arteries.  Using straight catheter and glidewire,  I selected the right superficial femoral artery.  I advanced the catheter into the superficial femoral artery until it was hubbed.  I did another carbon dioxide injection.  This  demonstrated a distal superficial femoral artery occlusion.  I felt it was reasonable to attempt intervention.  The wire was exchanged for a Rosen wire.  The patient's right femoral sheath was exchanged for a 6-Fr Destination sheath, which was lodged in the left superficial femoral artery.  The dilator was removed.  I loaded a long glidewire and BER-2 catheter through the sheath.  Using this combination I got the distal superficial femoral artery occlusion.  I tried a subintimal technique to get around the calcific chronic total occlusion.  While the wire could make some progress, the catheter could not get into the sub-intimal plane.  I did not think changing to a flatter profile catheter would improve this outcome.   Based on the images, the patient is a candidate for a common femoral artery or superficial femoral artery to below-the-knee popliteal artery bypass.  The catheter and wire were removed.  I pulled the sheath into the right external iliac artery. The sheath was aspirated.  No clots were present and the sheath was reloaded with heparinized saline.    The sheath was pulled and pressure held for 20 minutes.  No persistent bleeding was present.  Patient will be sent for cardiac risk stratification.  Patient will follow up in 3-4 weeks.   COMPLICATIONS: none  CONDITION: stable   Adele Barthel, MD, [facility] [place] Office: [phone] Pager: [phone]  06/03/2018, 2:07 PM

## 2018-06-03 NOTE — Transfer of Care (Signed)
Immediate Anesthesia Transfer of Care Note  Patient: [name]  Procedure(s) Performed: ABDOMINAL AORTOGRAM WITH CARBON DIOXIDE LEFT LOWER EXTREMITY RUNOFF (Left Abdomen)  Patient Location: PACU  Anesthesia Type:MAC  Level of Consciousness: awake and alert   Airway & Oxygen Therapy: Patient Spontanous Breathing  Post-op Assessment: Report given to RN and Post -op Vital signs reviewed and stable  Post vital signs: Reviewed and stable  Last Vitals:  Vitals Value Taken Time  BP 120/65 06/03/2018  2:31 PM  Temp    Pulse 67 06/03/2018  2:39 PM  Resp 17 06/03/2018  2:39 PM  SpO2 91 % 06/03/2018  2:39 PM  Vitals shown include unvalidated device data.  Last Pain:  Vitals:   06/03/18 1154  TempSrc:   PainSc: 8       Patients Stated Pain Goal: 3 (34/96/11 6435)  Complications: No apparent anesthesia complications

## 2018-06-03 NOTE — Anesthesia Preprocedure Evaluation (Signed)
Anesthesia Evaluation  Patient identified by MRN, date of birth, ID band Patient awake    Reviewed: Allergy & Precautions, NPO status , Patient's Chart, lab work & pertinent test results  Airway Mallampati: II  TM Distance: >3 FB Neck ROM: Full    Dental  (+) Partial Upper   Pulmonary    breath sounds clear to auscultation       Cardiovascular hypertension,  Rhythm:Irregular Rate:Normal     Neuro/Psych    GI/Hepatic   Endo/Other  diabetes  Renal/GU      Musculoskeletal   Abdominal   Peds  Hematology   Anesthesia Other Findings   Reproductive/Obstetrics                             Anesthesia Physical Anesthesia Plan  ASA: III  Anesthesia Plan: MAC   Post-op Pain Management:    Induction: Intravenous  PONV Risk Score and Plan: Ondansetron and Propofol infusion  Airway Management Planned: Natural Airway  Additional Equipment:   Intra-op Plan:   Post-operative Plan:   Informed Consent: I have reviewed the patients History and Physical, chart, labs and discussed the procedure including the risks, benefits and alternatives for the proposed anesthesia with the patient or authorized representative who has indicated his/her understanding and acceptance.     Plan Discussed with: CRNA and Anesthesiologist  Anesthesia Plan Comments:         Anesthesia Quick Evaluation

## 2018-06-03 NOTE — Progress Notes (Addendum)
Pt transfer from pacu/ right groin level 0 right dp +1, c/o pain on right foot= chronic and takes OxyContin, pain med was given. 1815 Post ambulation right groin level 0,

## 2018-06-03 NOTE — Anesthesia Postprocedure Evaluation (Signed)
Anesthesia Post Note  Patient: [name]  Procedure(s) Performed: ABDOMINAL AORTOGRAM WITH CARBON DIOXIDE LEFT LOWER EXTREMITY RUNOFF (Left Abdomen)     Patient location during evaluation: PACU Anesthesia Type: MAC Level of consciousness: awake and alert Pain management: pain level controlled Vital Signs Assessment: post-procedure vital signs reviewed and stable Respiratory status: spontaneous breathing, nonlabored ventilation, respiratory function stable and patient connected to nasal cannula oxygen Cardiovascular status: stable and blood pressure returned to baseline Postop Assessment: no apparent nausea or vomiting Anesthetic complications: no    Last Vitals:  Vitals:   06/03/18 1551 06/03/18 1606  BP: (!) 149/57 (!) 135/55  Pulse: 63 62  Resp:    Temp:    SpO2: 99% 99%    Last Pain:  Vitals:   06/03/18 1825  TempSrc:   PainSc: 2                  Deneane Stifter COKER

## 2018-06-03 NOTE — Interval H&P Note (Signed)
   History and Physical Update  The patient was interviewed and re-examined.  The patient's previous History and Physical has been reviewed and is unchanged from my consult.  There is no change in the plan of care: aortogram with CO2, L leg runoff with CO2 and contrast, possible left leg intervention.   I discussed with the patient the nature of angiographic procedures, especially the limited patencies of any endovascular intervention.    The patient is aware of that the risks of an angiographic procedure include but are not limited to: bleeding, infection, access site complications, renal failure, embolization, rupture of vessel, dissection, arteriovenous fistula, possible need for emergent surgical intervention, possible need for surgical procedures to treat the patient's pathology, anaphylactic reaction to contrast, and stroke and death.    The patient is aware of the risks and agrees to proceed.  Adele Barthel, MD, FACS [facility] [place] Office: [phone] Pager: [phone]  06/03/2018, 11:15 AM

## 2018-06-03 NOTE — Discharge Instructions (Signed)

## 2018-06-04 ENCOUNTER — Encounter: Payer: Self-pay | Admitting: Vascular Surgery

## 2018-06-04 ENCOUNTER — Telehealth: Payer: Self-pay | Admitting: Cardiovascular Disease

## 2018-06-04 ENCOUNTER — Encounter (HOSPITAL_COMMUNITY): Payer: Self-pay | Admitting: Vascular Surgery

## 2018-06-04 NOTE — Telephone Encounter (Signed)
   Primary Cardiologist:Suresh Bronson Ing, MD  Chart reviewed as part of pre-operative protocol coverage. Because of [name]'s past medical history and time since last visit, 02/2016, he/she will require a follow-up visit in order to better assess preoperative cardiovascular risk.  Pre-op covering staff: - Please schedule appointment and call patient to inform them. - Please contact requesting surgeon's office via preferred method (i.e, phone, fax) to inform them of need for appointment prior to surgery.  Daune Perch, NP  06/04/2018, 12:15 PM

## 2018-06-04 NOTE — Telephone Encounter (Signed)
New message       [facility] Pre-operative Risk Assessment    Request for surgical clearance:  1. What type of surgery is being performed? FEM POP BYPASS  2. When is this surgery scheduled? TBD  3. What type of clearance is required (medical clearance vs. Pharmacy clearance to hold med vs. Both)? BOTH  4. Are there any medications that need to be held prior to surgery and how long?  5. Practice name and name of physician performing surgery? DR. Glennon Hamilton  6. What is your office phone number [phone]   7.   What is your office fax number [phone]  8.   Anesthesia type (None, local, MAC, general) ? [place] 06/04/2018, 9:38 AM  _________________________________________________________________   (provider comments below)

## 2018-06-04 NOTE — Telephone Encounter (Signed)
Spoke with [name] Dr.Chens nurse and notified her that pt has an appointment to see [name] on 7/3 and clearance will be addressed at visit.

## 2018-06-07 ENCOUNTER — Other Ambulatory Visit: Payer: Self-pay

## 2018-06-07 ENCOUNTER — Encounter (HOSPITAL_COMMUNITY): Payer: Self-pay | Admitting: Emergency Medicine

## 2018-06-07 ENCOUNTER — Emergency Department (HOSPITAL_COMMUNITY): Payer: Medicare HMO

## 2018-06-07 ENCOUNTER — Inpatient Hospital Stay (HOSPITAL_COMMUNITY)
Admission: EM | Admit: 2018-06-07 | Discharge: 2018-06-18 | DRG: 239 | Disposition: A | Discharge status: Skilled Nursing Facility | Payer: Medicare HMO | Attending: Internal Medicine | Admitting: Internal Medicine

## 2018-06-07 DIAGNOSIS — E876 Hypokalemia: Secondary | ICD-10-CM | POA: Diagnosis not present

## 2018-06-07 DIAGNOSIS — A419 Sepsis, unspecified organism: Secondary | ICD-10-CM | POA: Diagnosis not present

## 2018-06-07 DIAGNOSIS — E43 Unspecified severe protein-calorie malnutrition: Secondary | ICD-10-CM | POA: Diagnosis present

## 2018-06-07 DIAGNOSIS — I739 Peripheral vascular disease, unspecified: Secondary | ICD-10-CM | POA: Diagnosis present

## 2018-06-07 DIAGNOSIS — N390 Urinary tract infection, site not specified: Secondary | ICD-10-CM | POA: Diagnosis present

## 2018-06-07 DIAGNOSIS — I2510 Atherosclerotic heart disease of native coronary artery without angina pectoris: Secondary | ICD-10-CM | POA: Diagnosis present

## 2018-06-07 DIAGNOSIS — Z87440 Personal history of urinary (tract) infections: Secondary | ICD-10-CM

## 2018-06-07 DIAGNOSIS — I252 Old myocardial infarction: Secondary | ICD-10-CM | POA: Diagnosis not present

## 2018-06-07 DIAGNOSIS — Z794 Long term (current) use of insulin: Secondary | ICD-10-CM

## 2018-06-07 DIAGNOSIS — Z79899 Other long term (current) drug therapy: Secondary | ICD-10-CM

## 2018-06-07 DIAGNOSIS — I5032 Chronic diastolic (congestive) heart failure: Secondary | ICD-10-CM | POA: Diagnosis present

## 2018-06-07 DIAGNOSIS — E039 Hypothyroidism, unspecified: Secondary | ICD-10-CM | POA: Diagnosis present

## 2018-06-07 DIAGNOSIS — IMO0002 Reserved for concepts with insufficient information to code with codable children: Secondary | ICD-10-CM

## 2018-06-07 DIAGNOSIS — N184 Chronic kidney disease, stage 4 (severe): Secondary | ICD-10-CM | POA: Diagnosis present

## 2018-06-07 DIAGNOSIS — K5900 Constipation, unspecified: Secondary | ICD-10-CM | POA: Diagnosis not present

## 2018-06-07 DIAGNOSIS — L03116 Cellulitis of left lower limb: Secondary | ICD-10-CM

## 2018-06-07 DIAGNOSIS — E1159 Type 2 diabetes mellitus with other circulatory complications: Secondary | ICD-10-CM

## 2018-06-07 DIAGNOSIS — I130 Hypertensive heart and chronic kidney disease with heart failure and stage 1 through stage 4 chronic kidney disease, or unspecified chronic kidney disease: Secondary | ICD-10-CM | POA: Diagnosis present

## 2018-06-07 DIAGNOSIS — Z01810 Encounter for preprocedural cardiovascular examination: Secondary | ICD-10-CM

## 2018-06-07 DIAGNOSIS — I70262 Atherosclerosis of native arteries of extremities with gangrene, left leg: Secondary | ICD-10-CM

## 2018-06-07 DIAGNOSIS — E1122 Type 2 diabetes mellitus with diabetic chronic kidney disease: Secondary | ICD-10-CM | POA: Diagnosis present

## 2018-06-07 DIAGNOSIS — E1142 Type 2 diabetes mellitus with diabetic polyneuropathy: Secondary | ICD-10-CM | POA: Diagnosis present

## 2018-06-07 DIAGNOSIS — Z951 Presence of aortocoronary bypass graft: Secondary | ICD-10-CM

## 2018-06-07 DIAGNOSIS — Z6831 Body mass index (BMI) 31.0-31.9, adult: Secondary | ICD-10-CM | POA: Diagnosis not present

## 2018-06-07 DIAGNOSIS — I96 Gangrene, not elsewhere classified: Secondary | ICD-10-CM | POA: Diagnosis not present

## 2018-06-07 DIAGNOSIS — E785 Hyperlipidemia, unspecified: Secondary | ICD-10-CM | POA: Diagnosis present

## 2018-06-07 DIAGNOSIS — I4891 Unspecified atrial fibrillation: Secondary | ICD-10-CM | POA: Diagnosis not present

## 2018-06-07 DIAGNOSIS — B9620 Unspecified Escherichia coli [E. coli] as the cause of diseases classified elsewhere: Secondary | ICD-10-CM | POA: Diagnosis present

## 2018-06-07 DIAGNOSIS — N183 Chronic kidney disease, stage 3 (moderate): Secondary | ICD-10-CM | POA: Diagnosis not present

## 2018-06-07 DIAGNOSIS — E1152 Type 2 diabetes mellitus with diabetic peripheral angiopathy with gangrene: Principal | ICD-10-CM | POA: Diagnosis present

## 2018-06-07 DIAGNOSIS — I509 Heart failure, unspecified: Secondary | ICD-10-CM | POA: Diagnosis not present

## 2018-06-07 DIAGNOSIS — E1165 Type 2 diabetes mellitus with hyperglycemia: Secondary | ICD-10-CM | POA: Diagnosis present

## 2018-06-07 DIAGNOSIS — I25118 Atherosclerotic heart disease of native coronary artery with other forms of angina pectoris: Secondary | ICD-10-CM | POA: Diagnosis not present

## 2018-06-07 DIAGNOSIS — L97529 Non-pressure chronic ulcer of other part of left foot with unspecified severity: Secondary | ICD-10-CM | POA: Diagnosis present

## 2018-06-07 DIAGNOSIS — Z7982 Long term (current) use of aspirin: Secondary | ICD-10-CM

## 2018-06-07 DIAGNOSIS — L02612 Cutaneous abscess of left foot: Secondary | ICD-10-CM | POA: Diagnosis present

## 2018-06-07 DIAGNOSIS — Z8249 Family history of ischemic heart disease and other diseases of the circulatory system: Secondary | ICD-10-CM

## 2018-06-07 DIAGNOSIS — Z79890 Hormone replacement therapy (postmenopausal): Secondary | ICD-10-CM | POA: Diagnosis not present

## 2018-06-07 DIAGNOSIS — D649 Anemia, unspecified: Secondary | ICD-10-CM | POA: Diagnosis present

## 2018-06-07 DIAGNOSIS — I5030 Unspecified diastolic (congestive) heart failure: Secondary | ICD-10-CM | POA: Diagnosis not present

## 2018-06-07 DIAGNOSIS — I7025 Atherosclerosis of native arteries of other extremities with ulceration: Secondary | ICD-10-CM

## 2018-06-07 LAB — URINALYSIS, ROUTINE W REFLEX MICROSCOPIC
Bilirubin Urine: NEGATIVE
Glucose, UA: 150 mg/dL — AB
Ketones, ur: NEGATIVE mg/dL
Nitrite: NEGATIVE
PROTEIN: 100 mg/dL — AB
SPECIFIC GRAVITY, URINE: 1.013 (ref 1.005–1.030)
pH: 5 (ref 5.0–8.0)

## 2018-06-07 LAB — COMPREHENSIVE METABOLIC PANEL
ALK PHOS: 91 U/L (ref 38–126)
ALT: 33 U/L (ref 17–63)
AST: 42 U/L — AB (ref 15–41)
Albumin: 2.5 g/dL — ABNORMAL LOW (ref 3.5–5.0)
Anion gap: 10 (ref 5–15)
BILIRUBIN TOTAL: 1.4 mg/dL — AB (ref 0.3–1.2)
BUN: 45 mg/dL — AB (ref 6–20)
CALCIUM: 8.1 mg/dL — AB (ref 8.9–10.3)
CO2: 23 mmol/L (ref 22–32)
CREATININE: 2.58 mg/dL — AB (ref 0.61–1.24)
Chloride: 100 mmol/L — ABNORMAL LOW (ref 101–111)
GFR, EST AFRICAN AMERICAN: 25 mL/min — AB (ref >60)
GFR, EST NON AFRICAN AMERICAN: 22 mL/min — AB (ref >60)
Glucose, Bld: 333 mg/dL — ABNORMAL HIGH (ref 65–99)
Potassium: 3.4 mmol/L — ABNORMAL LOW (ref 3.5–5.1)
Sodium: 133 mmol/L — ABNORMAL LOW (ref 135–145)
Total Protein: 6.9 g/dL (ref 6.5–8.1)

## 2018-06-07 LAB — CBC WITH DIFFERENTIAL/PLATELET
BASOS PCT: 0 %
Basophils Absolute: 0 10*3/uL (ref 0.0–0.1)
EOS ABS: 0.2 10*3/uL (ref 0.0–0.7)
EOS PCT: 1 %
HCT: 28.8 % — ABNORMAL LOW (ref 39.0–52.0)
Hemoglobin: 9 g/dL — ABNORMAL LOW (ref 13.0–17.0)
LYMPHS ABS: 1.3 10*3/uL (ref 0.7–4.0)
Lymphocytes Relative: 8 %
MCH: 29 pg (ref 26.0–34.0)
MCHC: 31.3 g/dL (ref 30.0–36.0)
MCV: 92.9 fL (ref 78.0–100.0)
MONOS PCT: 8 %
Monocytes Absolute: 1.4 10*3/uL — ABNORMAL HIGH (ref 0.1–1.0)
Neutro Abs: 14.2 10*3/uL — ABNORMAL HIGH (ref 1.7–7.7)
Neutrophils Relative %: 83 %
PLATELETS: 274 10*3/uL (ref 150–400)
RBC: 3.1 MIL/uL — AB (ref 4.22–5.81)
RDW: 13.9 % (ref 11.5–15.5)
WBC: 17.1 10*3/uL — AB (ref 4.0–10.5)

## 2018-06-07 LAB — LACTIC ACID, PLASMA
LACTIC ACID, VENOUS: 0.9 mmol/L (ref 0.5–1.9)
LACTIC ACID, VENOUS: 1 mmol/L (ref 0.5–1.9)

## 2018-06-07 LAB — GLUCOSE, CAPILLARY: Glucose-Capillary: 326 mg/dL — ABNORMAL HIGH (ref 65–99)

## 2018-06-07 LAB — PROTIME-INR
INR: 1.32
Prothrombin Time: 16.2 seconds — ABNORMAL HIGH (ref 11.4–15.2)

## 2018-06-07 LAB — CBG MONITORING, ED: Glucose-Capillary: 310 mg/dL — ABNORMAL HIGH (ref 65–99)

## 2018-06-07 LAB — TROPONIN I: Troponin I: 0.07 ng/mL (ref <0.03)

## 2018-06-07 MED ORDER — AMLODIPINE BESYLATE 5 MG PO TABS
5.0000 mg | ORAL_TABLET | Freq: Every day | ORAL | Status: DC
  Administered 2018-06-08: 5 mg via ORAL
  Filled 2018-06-07: qty 1

## 2018-06-07 MED ORDER — PIPERACILLIN-TAZOBACTAM 3.375 G IVPB
3.3750 g | Freq: Three times a day (TID) | INTRAVENOUS | Status: DC
  Administered 2018-06-07 – 2018-06-18 (×34): 3.375 g via INTRAVENOUS
  Filled 2018-06-07 (×35): qty 50

## 2018-06-07 MED ORDER — ASPIRIN 81 MG PO TABS: 81.0000 mg | ORAL_TABLET | Freq: Every day | ORAL | Status: DC

## 2018-06-07 MED ORDER — MORPHINE SULFATE (PF) 4 MG/ML IV SOLN
4.0000 mg | INTRAVENOUS | Status: DC | PRN
  Filled 2018-06-07: qty 1

## 2018-06-07 MED ORDER — ATORVASTATIN CALCIUM 40 MG PO TABS
40.0000 mg | ORAL_TABLET | Freq: Every day | ORAL | Status: DC
  Administered 2018-06-07 – 2018-06-17 (×11): 40 mg via ORAL
  Filled 2018-06-07 (×11): qty 1

## 2018-06-07 MED ORDER — INSULIN ASPART 100 UNIT/ML ~~LOC~~ SOLN
0.0000 [IU] | Freq: Three times a day (TID) | SUBCUTANEOUS | Status: DC
  Administered 2018-06-08: 3 [IU] via SUBCUTANEOUS
  Administered 2018-06-08: 5 [IU] via SUBCUTANEOUS
  Administered 2018-06-08: 3 [IU] via SUBCUTANEOUS
  Administered 2018-06-09: 5 [IU] via SUBCUTANEOUS
  Administered 2018-06-09 (×2): 3 [IU] via SUBCUTANEOUS
  Administered 2018-06-10: 5 [IU] via SUBCUTANEOUS
  Administered 2018-06-10: 2 [IU] via SUBCUTANEOUS
  Administered 2018-06-10: 3 [IU] via SUBCUTANEOUS
  Administered 2018-06-11: 8 [IU] via SUBCUTANEOUS
  Administered 2018-06-11: 3 [IU] via SUBCUTANEOUS
  Administered 2018-06-11: 8 [IU] via SUBCUTANEOUS
  Administered 2018-06-12: 5 [IU] via SUBCUTANEOUS
  Administered 2018-06-12 – 2018-06-15 (×5): 2 [IU] via SUBCUTANEOUS
  Administered 2018-06-15 – 2018-06-16 (×2): 3 [IU] via SUBCUTANEOUS
  Administered 2018-06-17: 2 [IU] via SUBCUTANEOUS
  Administered 2018-06-17: 3 [IU] via SUBCUTANEOUS
  Administered 2018-06-18: 2 [IU] via SUBCUTANEOUS

## 2018-06-07 MED ORDER — POLYETHYLENE GLYCOL 3350 17 G PO PACK
17.0000 g | PACK | Freq: Every day | ORAL | Status: DC | PRN
  Administered 2018-06-12: 17 g via ORAL
  Filled 2018-06-07: qty 1

## 2018-06-07 MED ORDER — ACETAMINOPHEN 650 MG RE SUPP: 650.0000 mg | Freq: Four times a day (QID) | RECTAL | Status: DC | PRN

## 2018-06-07 MED ORDER — ONDANSETRON HCL 4 MG PO TABS: 4.0000 mg | ORAL_TABLET | Freq: Four times a day (QID) | ORAL | Status: DC | PRN

## 2018-06-07 MED ORDER — SODIUM CHLORIDE 0.9 % IV SOLN
INTRAVENOUS | Status: DC
  Administered 2018-06-07 – 2018-06-08 (×2): via INTRAVENOUS

## 2018-06-07 MED ORDER — HEPARIN BOLUS VIA INFUSION
4500.0000 [IU] | Freq: Once | INTRAVENOUS | Status: AC
  Administered 2018-06-07: 4500 [IU] via INTRAVENOUS
  Filled 2018-06-07: qty 4500

## 2018-06-07 MED ORDER — ACETAMINOPHEN 325 MG PO TABS
650.0000 mg | ORAL_TABLET | Freq: Four times a day (QID) | ORAL | Status: DC | PRN
  Administered 2018-06-16: 650 mg via ORAL
  Filled 2018-06-07: qty 2

## 2018-06-07 MED ORDER — HYDROMORPHONE HCL 1 MG/ML IJ SOLN
0.5000 mg | INTRAMUSCULAR | Status: DC | PRN
  Administered 2018-06-07: 0.5 mg via INTRAVENOUS
  Filled 2018-06-07: qty 1

## 2018-06-07 MED ORDER — PIPERACILLIN-TAZOBACTAM 3.375 G IVPB 30 MIN
3.3750 g | Freq: Once | INTRAVENOUS | Status: AC
  Administered 2018-06-07: 3.375 g via INTRAVENOUS
  Filled 2018-06-07: qty 50

## 2018-06-07 MED ORDER — HEPARIN (PORCINE) IN NACL 100-0.45 UNIT/ML-% IJ SOLN
1350.0000 [IU]/h | INTRAMUSCULAR | Status: DC
  Administered 2018-06-07 – 2018-06-08 (×2): 1300 [IU]/h via INTRAVENOUS
  Administered 2018-06-09: 1350 [IU]/h via INTRAVENOUS
  Administered 2018-06-09: 1300 [IU]/h via INTRAVENOUS
  Filled 2018-06-07 (×4): qty 250

## 2018-06-07 MED ORDER — INSULIN GLARGINE 100 UNIT/ML ~~LOC~~ SOLN
15.0000 [IU] | Freq: Two times a day (BID) | SUBCUTANEOUS | Status: DC
  Administered 2018-06-07 – 2018-06-08 (×2): 15 [IU] via SUBCUTANEOUS
  Filled 2018-06-07 (×3): qty 0.15

## 2018-06-07 MED ORDER — ASPIRIN 81 MG PO CHEW
81.0000 mg | CHEWABLE_TABLET | Freq: Every day | ORAL | Status: DC
  Administered 2018-06-07 – 2018-06-17 (×11): 81 mg via ORAL
  Filled 2018-06-07 (×11): qty 1

## 2018-06-07 MED ORDER — ONDANSETRON HCL 4 MG/2ML IJ SOLN: 4.0000 mg | Freq: Four times a day (QID) | INTRAMUSCULAR | Status: DC | PRN

## 2018-06-07 MED ORDER — HYDROMORPHONE HCL 1 MG/ML IJ SOLN
1.0000 mg | INTRAMUSCULAR | Status: DC | PRN
  Administered 2018-06-07 – 2018-06-08 (×3): 1 mg via INTRAVENOUS
  Filled 2018-06-07 (×4): qty 1

## 2018-06-07 MED ORDER — LEVOTHYROXINE SODIUM 50 MCG PO TABS
50.0000 ug | ORAL_TABLET | Freq: Every day | ORAL | Status: DC
  Administered 2018-06-08 – 2018-06-18 (×10): 50 ug via ORAL
  Filled 2018-06-07 (×11): qty 1

## 2018-06-07 MED ORDER — MORPHINE SULFATE (PF) 4 MG/ML IV SOLN
4.0000 mg | Freq: Once | INTRAVENOUS | Status: AC
Start: 2018-06-07 — End: 2018-06-07
  Administered 2018-06-07: 4 mg via INTRAVENOUS
  Filled 2018-06-07: qty 1

## 2018-06-07 NOTE — ED Triage Notes (Signed)
Pt reports increasing left foot pain with necrotic 4th toe and increased redness and discoloration.  Pt had intervention on this foot, but physician was unable to complete due to complete occlusion in left leg.

## 2018-06-07 NOTE — ED Notes (Signed)
Report given to [facility] at this time.   

## 2018-06-07 NOTE — ED Provider Notes (Signed)
     Pt is a transfer from [facility] for necrotic toes.  Pt has a hx of PAD and sees Dr. Bridgett Larsson (vascular).  On 6/20, Dr. Bridgett Larsson did an aortogram with carbon dioxide and left leg run off to evaluate blood supply.  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  The pt was considered for a common femoral artery to below the knee popliteal a. Bypass.  Surgery was to be scheduled in a few weeks if he could get cardiac clearance.  He presented to [facility] today b/c his toes had turned black and are very painful.  See picture above.  [facility] spoke with Dr. Bridgett Larsson who recommended transfer here.  Heparin started and zosyn given for cellulitis associated with pvd.  Dr. Bridgett Larsson in the [place], but informed that pt is here.  [facility] will admit.   Isla Pence, MD 06/07/18 [phone]

## 2018-06-07 NOTE — Progress Notes (Signed)
ANTICOAGULATION CONSULT NOTE - Initial Consult  Pharmacy Consult for heparin Indication: arterial occlusion  No Known Allergies  Patient Measurements: Height: 5\' 5"  (165.1 cm) Weight: 191 lb (86.6 kg) IBW/kg (Calculated) : 61.5 Heparin Dosing Weight: 80kg  Vital Signs: Temp: 98.1 F (36.7 C) (06/24 1246) Temp Source: Oral (06/24 1246) BP: 139/67 (06/24 1440) Pulse Rate: 75 (06/24 1440)  Labs: Recent Labs    06/07/18 1257 06/07/18 1338  HGB 9.0*  --   HCT 28.8*  --   PLT 274  --   LABPROT 16.2*  --   INR 1.32  --   CREATININE 2.58*  --   TROPONINI  --  0.07*    Estimated Creatinine Clearance: 22.7 mL/min (A) (by C-G formula based on SCr of 2.58 mg/dL (H)).   Medical History: Past Medical History:  Diagnosis Date  . Anemia   . Carotid artery disease ([place])   . CHF (congestive heart failure) ([place])   . CKD (chronic kidney disease), stage IV ([place])   . Coronary artery disease 2010   Status post coronary artery bypass grafting  . Diabetes mellitus   . Dysrhythmia   . Foot ulcer ([place])   . Hyperlipidemia   . Hypertension   . Hypothyroidism   . Myocardial infarction ([place]) 2010  . PAD (peripheral artery disease) ([place])   . Postoperative atrial fibrillation ([place])   . Thrombocytopenia ([facility])     Medications:  Infusions:  . sodium chloride 75 mL/hr at 06/07/18 1345  . heparin    . piperacillin-tazobactam 3.375 g (06/07/18 1622)    Assessment: 89 yom presented to the ED with foot pain. To start IV heparin for arterial occlusion.  Hgb is low at 9 and platelets are WNL. He is not on anticoagulation PTA.S/p abdominal aortogram 6/20.   Goal of Therapy:  Heparin level 0.3-0.7 units/ml Monitor platelets by anticoagulation protocol: Yes   Plan:  Heparin bolus 4500 units IV x 1 Heparin gtt 1300 units/hr Check an 8 hr heparin level Daily heparin level and CBC  Anjelique Makar, Rande Lawman 06/07/2018,4:17 PM

## 2018-06-07 NOTE — ED Notes (Signed)
Critical value Troponin  0.07 given to Dr. Thurnell Garbe.

## 2018-06-07 NOTE — Progress Notes (Signed)
Pharmacy Antibiotic Note  [name] is a 83 y.o. male admitted on 06/07/2018 with foot pain.  Pharmacy has been consulted for zosyn dosing for gangrene. Pt is afebrile but WBC is elevated at 17.2. SCr is also elevated.   Plan: Zosyn 3.375gm IV Q8H (4 hr inf) F/u renal fxn, C&S, clinical status   Height: 5\' 5"  (165.1 cm) Weight: 191 lb (86.6 kg) IBW/kg (Calculated) : 61.5  Temp (24hrs), Avg:98.1 F (36.7 C), Min:98.1 F (36.7 C), Max:98.1 F (36.7 C)  Recent Labs  Lab 06/03/18 1136 06/07/18 1257 06/07/18 1450  WBC  --  17.1*  --   CREATININE 2.60* 2.58*  --   LATICACIDVEN  --  0.9 1.0    Estimated Creatinine Clearance: 22.7 mL/min (A) (by C-G formula based on SCr of 2.58 mg/dL (H)).    No Known Allergies  Antimicrobials this admission: Zosyn 6/24>>  Dose adjustments this admission: N/A  Microbiology results: Pending  Thank you for allowing pharmacy to be a part of this patient's care.  Che Below, Rande Lawman 06/07/2018 5:56 PM

## 2018-06-07 NOTE — H&P (Signed)
Date: 06/07/2018               Patient Name:  [name] MRN: [id number]  DOB: [birth year] Age / Sex: 83 y.o., male   PCP: [facility], [facility]         Medical Service: Internal Medicine Teaching Service         Attending Physician: Dr. Aldine Contes, MD    First Contact: Dr. Trilby Drummer Pager: [phone]  Second Contact: Dr. Danford Bad Pager: [phone]       After Hours (After 5p/  First Contact Pager: [phone]  weekends / holidays): Second Contact Pager: [phone]   Chief Complaint: Necrosis and pain of left foot  History of Present Illness: [name] is an 83 yo M with Hx of PAD, CHF (EF 60-65%), CKD III, A-fib, CAD (s/p CABG), DM, HTN, HLD, Hypothyroidism, and Anemia who presented with pain and necrosis of his distal left lower extremity. Patient had an angiography for vascular evaluation in the setting of LLE ulcers, performed on 6/20 showing occluded left SFA, Popliteal A., anterior tibial, and posterior tibial arteries. Patient was to be scheduled for cardiac risk stratification with instruction to follow up with vascular surgery in 3-4 weeks. In the last 2-3 days he and family noticed significant worsening of the ulcers and worsening pain. He came in to be seen and toes noted to be necrotic with foul odor on presentation. Finding consistent with surronding cellulitis also noted. Patient states his pain is a constant 6-10/10 pain localized to 3rd, 4th, and 5th digits pain is improved to a 6 with pain medication. Patient endorses recent loose bowels / mild incontinence. He denies fevers, chills, chest pain, dyspnea, constipation, nausea or vomiting.  In the ED: - Vitals: Stable - Labs: CMP showed K 3.4, Cr 2.58 (baseline, CKD III), Glucose 333, Albumin 2.4, T.bili 1.4; CBC showed WBC 17.1; PT 16.2, INR 1.32; Lactate 0.9 >> 1.0; Troponin 0.07; Blood Cultures and UA pending. - EKG: Baseline wander, sinus rhythm, intraventricular conduction delay, LVH - CXR: Poor inspiratory effort,  with increased vascular markings, no focal infiltrate - Interventions: Heparin gtt, 3.375g Zosyn, Morphine 4mg , Dilaudid 0.5mg   Patient to be admitted for further workup and care  Meds:  Current Meds  Medication Sig  . amLODipine (NORVASC) 5 MG tablet Take 5 mg by mouth daily.  Marland Kitchen aspirin 81 MG tablet Take 81 mg by mouth at bedtime.   Marland Kitchen atorvastatin (LIPITOR) 40 MG tablet Take 40 mg by mouth at bedtime.   . furosemide (LASIX) 40 MG tablet Take 1 tablet (40 mg total) by mouth 2 (two) times daily.  . insulin aspart (NOVOLOG) 100 UNIT/ML injection Inject 5-15 Units into the skin 3 (three) times daily as needed for high blood sugar (CBG >200).  . insulin glargine (LANTUS) 100 UNIT/ML injection Inject 40 Units into the skin 2 (two) times daily.  Marland Kitchen levothyroxine (SYNTHROID, LEVOTHROID) 50 MCG tablet Take 50 mcg by mouth daily.  . [DISCONTINUED] oxyCODONE-acetaminophen (PERCOCET) 10-325 MG tablet Take 1 tablet by mouth every 6 (six) hours as needed for pain.    Allergies: Allergies as of 06/07/2018  . (No Known Allergies)   Past Medical History:  Diagnosis Date  . Anemia   . Carotid artery disease ([place])   . CHF (congestive heart failure) ([place])   . CKD (chronic kidney disease), stage IV ([place])   . Coronary artery disease 2010   Status post coronary artery bypass grafting  . Diabetes mellitus   .  Dysrhythmia   . Foot ulcer ([place])   . Hyperlipidemia   . Hypertension   . Hypothyroidism   . Myocardial infarction ([place]) 2010  . PAD (peripheral artery disease) ([place])   . Postoperative atrial fibrillation ([place])   . Thrombocytopenia ([place])    Family History:  Family History  Problem Relation Age of Onset  . Heart attack Father   . Heart attack Brother   - Reviewed on admission  Social History:  Social History   Tobacco Use  . Smoking status: Never Smoker  . Smokeless tobacco: Never Used  Substance Use Topics  . Alcohol use: Not Currently    Alcohol/week: 0.0 oz  . Drug use: No  -  Reviewed on admission  Review of Systems: A complete ROS was negative except as per HPI.  Physical Exam: Blood pressure (!) 153/66, pulse 80, temperature 98.1 F (36.7 C), temperature source Oral, resp. rate 20, height 5\' 5"  (1.651 m), weight 191 lb (86.6 kg), SpO2 100 %. Physical Exam  Constitutional: He is oriented to person, place, and time. He appears well-developed and well-nourished. No distress.  HENT:  Head: Normocephalic and atraumatic.  Eyes: EOM are normal. Right eye exhibits no discharge. Left eye exhibits no discharge.  Cardiovascular: Normal rate, regular rhythm, normal heart sounds and intact distal pulses.  Pulmonary/Chest: Effort normal and breath sounds normal. No respiratory distress.  Abdominal: Soft. Bowel sounds are normal. He exhibits no distension. There is no tenderness.  Musculoskeletal: He exhibits no edema.  Neurological: He is alert and oriented to person, place, and time.  Skin: Skin is warm and dry.  Necrosis of 3rd 4th and 5th digits of LLE, 4th worse than other two   EKG: personally reviewed my interpretation is baseline wander, sinus rhythm, intraventricular conduction delay, LVH  CXR: personally reviewed my interpretation is Poor inspiratory effort, with increased vascular markings, no focal infiltrate  Assessment & Plan by Problem: Active Problems:   Atherosclerosis of native arteries of extremities with gangrene, left leg (HCC)  Cellulitis Atherosclerosis of native arteries of extremities with gangrene, left leg: Recent angiography in the setting of LLE ulcer showed occlusions. Past 2-3 days he and family noticed significant worsening of the ulcers and worsening pain. Wound necrotic on exam with foul odor. Surrounding skin erythematous and edematous consistent with surronding cellulitis. > Vascular to see later, did comment that will need Stent and Cardiac clearance prior to amputation - Vascular Surgery Consulted in the ED, appreciate their  recommendations - Echocardiogram - Heparin gtt - Dilaudid 1mg  q3h PRN - Continue Zosyn - AM CBC and CMP  DM: On Lantus 40U BID and Novolog 5-15U TID at home - CBGs - Lantus 15U BID - SSI-M  CHF (EF 60-65%): - Holding home lasix 40mg  Daily  CKD III: Stable, Cr at baseline.  A-fib: Sinus rhythm in ED. Not on any anticoagulation at home.  CAD (s/p CABG): Continue home ASA and Atrovastatin  HTN: Continue home amlodipine 5mg  Daily  HLD: Continue home Atrovastatin  Hypothyroidism: Continue home Synthroid 49mcg Daily  Anemia: Stable chronic anemia with Hgb 9  FEN: NPO for possible procedure VTE ppx: Lovenox Code Status: FULL   Dispo: Admit patient to Inpatient with expected length of stay greater than 2 midnights.  Signed: Neva Seat, MD 06/07/2018, 5:45 PM  Pager: [phone]

## 2018-06-07 NOTE — ED Notes (Signed)
Pt placed on 2L [place] with improvement in o2 sat.  Pedal pulses verified present by doppler by myself and Josiah Lobo RN.

## 2018-06-07 NOTE — Progress Notes (Addendum)
   Daily Progress Note  Pt is known to me from his L foot plantar ulcers and recent angiogram.  He needs a left femopopliteal bypass but he has significant co-morbidities that will require optimization.  Additionally with recent onset of atrial fibrillation, he is not an operative candidate without preoperative optimization with Cardiology as Anesthesiology will want such..  - will see pt later tonight - would focus on medical optimization for now.  In the past, he has NOT been medically stable for an angiogram much less surgery - he minimally will likely need a L fem-pop bypass to salvage a L TMA.     Adele Barthel, MD, FACS [facility] Office: [phone] Pager: [phone]  06/07/2018, 5:05 PM

## 2018-06-07 NOTE — ED Provider Notes (Signed)
[facility] EMERGENCY DEPARTMENT Provider Note   CSN: [id number] Arrival date & time: 06/07/18  1238     History   Chief Complaint Chief Complaint  Patient presents with  . Foot Problem    HPI [name] is a 83 y.o. male.  HPI  Pt was seen at 1310. Per pt and his family, c/o gradual onset and worsening of persistent left foot "pain" and "discoloration" for the past 1 to 2 weeks, worse over the past 2 days. Pt's family states pt had an abd aortogram last week for left foot pain and necrotic 4th toe. Pt states his left forefoot was also "red" at that time. Pt's family states pt's left 3rd and 5th toes "are purple" since yesterday, and they fell the redness of his left foot "is worse too." Pt states he "can't walk" on his left foot due to increasing pain. Pt's family called pt's Vascular Surgeon Dr. Bridgett Larsson, and was told go to the ED. Family states they were told that [facility] ED would transfer pt to [facility] ED. Denies fevers, no injury, no focal motor weakness, no abd pain, no CP/SOB.   Past Medical History:  Diagnosis Date  . Anemia   . Carotid artery disease ([place])   . CHF (congestive heart failure) ([place])   . CKD (chronic kidney disease), stage IV ([place])   . Coronary artery disease 2010   Status post coronary artery bypass grafting  . Diabetes mellitus   . Dysrhythmia   . Foot ulcer ([place])   . Hyperlipidemia   . Hypertension   . Hypothyroidism   . Myocardial infarction ([place]) 2010  . PAD (peripheral artery disease) ([place])   . Postoperative atrial fibrillation ([place])   . Thrombocytopenia [facility])     Patient Active Problem List   Diagnosis Date Noted  . Atherosclerosis of native arteries of the extremities with ulceration ([place]) 05/26/2018  . Acute respiratory failure with hypoxia ([place])   . Chronic ulcer of left foot ([place]) 01/08/2018  . Hypothyroidism 01/08/2018  . Cellulitis 12/24/2017  . Nausea and vomiting 12/23/2017  . DM type 2 causing vascular disease ([place]) 05/07/2016  .  Essential hypertension, benign 05/07/2016  . Other specified hypothyroidism 05/07/2016  . Bilateral carotid bruits 03/11/2016  . Elevated troponin   . Acute on chronic diastolic CHF (congestive heart failure) ([place])   . Bacteremia 02/24/2016  . Sepsis ([place]) 02/22/2016  . Uncontrolled type 2 diabetes mellitus with stage 4 chronic kidney disease ([place]) 02/22/2016  . Demand ischemia ([place]) 02/22/2016  . UTI (urinary tract infection) 02/22/2016  . Acute renal failure superimposed on stage 3 chronic kidney disease ([place])   . Pyrexia   . Arterial hypotension   . Hypoxia   . UTI (lower urinary tract infection)   . OBESITY, UNSPECIFIED 08/24/2009  . Hyperlipidemia 08/15/2009  . Essential hypertension 08/15/2009  . CAD, ARTERY BYPASS GRAFT 08/15/2009  . Atrial fibrillation ([place]) 08/15/2009    Past Surgical History:  Procedure Laterality Date  . AORTOGRAM Left 06/03/2018   Procedure: ABDOMINAL AORTOGRAM WITH CARBON DIOXIDE LEFT LOWER EXTREMITY RUNOFF;  Surgeon: Conrad Creekside, MD;  Location: [place];  Service: Vascular;  Laterality: Left;  . carotid insuff    . CORONARY ARTERY BYPASS GRAFT  2010   LIMA to LAD, SVG to diagonal, SVG to circumflex, marginal, SVG to posterior descending branch.        Home Medications    Prior to Admission medications   Medication Sig Start Date End Date Taking?  Authorizing Provider  amLODipine (NORVASC) 5 MG tablet Take 5 mg by mouth daily.    [provider]  aspirin 81 MG tablet Take 81 mg by mouth at bedtime.     [provider]  atorvastatin (LIPITOR) 40 MG tablet Take 40 mg by mouth at bedtime.  01/30/16   [provider]  furosemide (LASIX) 40 MG tablet Take 1 tablet (40 mg total) by mouth 2 (two) times daily. 01/10/18   Doreatha Lew, MD  insulin aspart (NOVOLOG) 100 UNIT/ML injection Inject 5-15 Units into the skin 3 (three) times daily as needed for high blood sugar (CBG >200).    [provider]  insulin  glargine (LANTUS) 100 UNIT/ML injection Inject 40 Units into the skin 2 (two) times daily.    [provider]  levothyroxine (SYNTHROID, LEVOTHROID) 50 MCG tablet Take 50 mcg by mouth daily.    [provider]  Omega-3 Fatty Acids (FISH OIL PO) Take 2 capsules by mouth daily.     [provider]  oxyCODONE-acetaminophen (PERCOCET) 10-325 MG tablet Take 1 tablet by mouth every 6 (six) hours as needed for pain.  02/12/16   [provider]    Family History Family History  Problem Relation Age of Onset  . Heart attack Father   . Heart attack Brother     Social History Social History   Tobacco Use  . Smoking status: Never Smoker  . Smokeless tobacco: Never Used  Substance Use Topics  . Alcohol use: Not Currently    Alcohol/week: 0.0 oz  . Drug use: No     Allergies   Patient has no known allergies.   Review of Systems Review of Systems ROS: Statement: All systems negative except as marked or noted in the HPI; Constitutional: Negative for fever and chills. ; ; Eyes: Negative for eye pain, redness and discharge. ; ; ENMT: Negative for ear pain, hoarseness, nasal congestion, sinus pressure and sore throat. ; ; Cardiovascular: Negative for chest pain, palpitations, diaphoresis, dyspnea and peripheral edema. ; ; Respiratory: Negative for cough, wheezing and stridor. ; ; Gastrointestinal: Negative for nausea, vomiting, diarrhea, abdominal pain, blood in stool, hematemesis, jaundice and rectal bleeding. . ; ; Genitourinary: Negative for dysuria, flank pain and hematuria. ; ; Musculoskeletal: +left foot pain, rash. Negative for back pain and neck pain. Negative for swelling and trauma.; ; Skin: Negative for pruritus, abrasions, blisters, and skin lesion.; ; Neuro: Negative for headache, lightheadedness and neck stiffness. Negative for weakness, altered level of consciousness, altered mental status, extremity weakness, paresthesias, involuntary movement, seizure  and syncope.       Physical Exam Updated Vital Signs BP (!) 141/62   Pulse 72   Temp 98.1 F (36.7 C) (Oral)   Resp 19   Ht 5\' 5"  (1.651 m)   Wt 86.6 kg (191 lb)   SpO2 96%   BMI 31.78 kg/m    Patient Vitals for the past 24 hrs:  BP Temp Temp src Pulse Resp SpO2 Height Weight  06/07/18 1400 (!) 141/62 - - 72 19 96 % - -  06/07/18 1330 (!) 149/75 - - 80 19 96 % - -  06/07/18 1300 137/65 - - 82 20 96 % - -  06/07/18 1246 (!) 153/95 98.1 F (36.7 C) Oral 89 18 (!) 88 % - -  06/07/18 1245 - - - - - - 5\' 5"  (1.651 m) 86.6 kg (191 lb)     Physical Exam 1315: Physical examination:  Nursing notes reviewed; Vital signs and O2 SAT reviewed;  Constitutional: Well developed, Well nourished, Well hydrated, In no acute distress; Head:  Normocephalic, atraumatic; Eyes: EOMI, PERRL, No scleral icterus; ENMT: Mouth and pharynx normal, Mucous membranes moist; Neck: Supple, Full range of motion, No lymphadenopathy; Cardiovascular: Regular rate and rhythm, No gallop; Respiratory: Breath sounds clear & equal bilaterally, No wheezes.  Speaking full sentences with ease, Normal respiratory effort/excursion; Chest: Nontender, Movement normal; Abdomen: Soft, Nontender, Nondistended, Normal bowel sounds; Genitourinary: No CVA tenderness; Extremities: Peripheral pulses: palp femoral pulses bilat, no palp popliteal, PT, DP bilat. +erythema to left forefoot. +necrotic left 4th toe, purple 3rd and 5th toes extending from base of toes to distal plantar left foot. +1 left pedal edema. No calf edema or asymmetry. Right foot/leg warm/dry/good color. LE's muscles compartments soft.; Neuro: AA&Ox3, Major CN grossly intact.  Speech clear. No gross focal motor deficits in extremities.; Skin: Color normal, Warm, Dry.   ED Treatments / Results  Labs (all labs ordered are listed, but only abnormal results are displayed)   EKG EKG Interpretation  Date/Time:  Monday June 07 2018 12:41:35 EDT Ventricular Rate:  93 PR  Interval:    QRS Duration: 112 QT Interval:  381 QTC Calculation: 474 R Axis:   104 Text Interpretation:  Sinus rhythm Left posterior fascicular block LVH by voltage Inferior infarct, old Anterior Q waves, possibly due to LVH Baseline wander When compared with ECG of 01/08/2018 No significant change was found Confirmed by Francine Graven [phone]) on 06/07/2018 1:49:34 PM   Radiology   Procedures Procedures (including critical care time)  Medications Ordered in ED Medications  morphine 4 MG/ML injection 4 mg (has no administration in time range)  0.9 %  sodium chloride infusion ( Intravenous New Bag/Given 06/07/18 1345)  HYDROmorphone (DILAUDID) injection 0.5 mg (0.5 mg Intravenous Given 06/07/18 1418)     Initial Impression / Assessment and Plan / ED Course  I have reviewed the triage vital signs and the nursing notes.  Pertinent labs & imaging results that were available during my care of the patient were reviewed by me and considered in my medical decision making (see chart for details).  MDM Reviewed: previous chart, nursing note and vitals Reviewed previous: labs and ECG Interpretation: labs, x-ray and ECG Total time providing critical care: 30-74 minutes. This excludes time spent performing separately reportable procedures and services. Consults: Vascular Surgeon.   CRITICAL CARE Performed by: Alfonzo Feller Total critical care time: 35 minutes Critical care time was exclusive of separately billable procedures and treating other patients. Critical care was necessary to treat or prevent imminent or life-threatening deterioration. Critical care was time spent personally by me on the following activities: development of treatment plan with patient and/or surrogate as well as nursing, discussions with consultants, evaluation of patient's response to treatment, examination of patient, obtaining history from patient or surrogate, ordering and performing treatments and  interventions, ordering and review of laboratory studies, ordering and review of radiographic studies, pulse oximetry and re-evaluation of patient's condition.   Results for orders placed or performed during the hospital encounter of 06/07/18  Culture, blood (Routine x 2)  Result Value Ref Range   Specimen Description LEFT ANTECUBITAL    Special Requests      BOTTLES DRAWN AEROBIC AND ANAEROBIC Blood Culture adequate volume Performed at [facility], [address] [place], [place] [address]    Culture PENDING    Report Status PENDING   Comprehensive metabolic panel  Result Value Ref  Range   Sodium 133 (L) 135 - 145 mmol/L   Potassium 3.4 (L) 3.5 - 5.1 mmol/L   Chloride 100 (L) 101 - 111 mmol/L   CO2 23 22 - 32 mmol/L   Glucose, Bld 333 (H) 65 - 99 mg/dL   BUN 45 (H) 6 - 20 mg/dL   Creatinine, Ser 2.58 (H) 0.61 - 1.24 mg/dL   Calcium 8.1 (L) 8.9 - 10.3 mg/dL   Total Protein 6.9 6.5 - 8.1 g/dL   Albumin 2.5 (L) 3.5 - 5.0 g/dL   AST 42 (H) 15 - 41 U/L   ALT 33 17 - 63 U/L   Alkaline Phosphatase 91 38 - 126 U/L   Total Bilirubin 1.4 (H) 0.3 - 1.2 mg/dL   GFR calc non Af Amer 22 (L) >60 mL/min   GFR calc Af Amer 25 (L) >60 mL/min   Anion gap 10 5 - 15  CBC with Differential  Result Value Ref Range   WBC 17.1 (H) 4.0 - 10.5 K/uL   RBC 3.10 (L) 4.22 - 5.81 MIL/uL   Hemoglobin 9.0 (L) 13.0 - 17.0 g/dL   HCT 28.8 (L) 39.0 - 52.0 %   MCV 92.9 78.0 - 100.0 fL   MCH 29.0 26.0 - 34.0 pg   MCHC 31.3 30.0 - 36.0 g/dL   RDW 13.9 11.5 - 15.5 %   Platelets 274 150 - 400 K/uL   Neutrophils Relative % 83 %   Neutro Abs 14.2 (H) 1.7 - 7.7 K/uL   Lymphocytes Relative 8 %   Lymphs Abs 1.3 0.7 - 4.0 K/uL   Monocytes Relative 8 %   Monocytes Absolute 1.4 (H) 0.1 - 1.0 K/uL   Eosinophils Relative 1 %   Eosinophils Absolute 0.2 0.0 - 0.7 K/uL   Basophils Relative 0 %   Basophils Absolute 0.0 0.0 - 0.1 K/uL  Protime-INR  Result Value Ref Range   Prothrombin Time 16.2 (H) 11.4 - 15.2  seconds   INR 1.32   Lactic acid, plasma  Result Value Ref Range   Lactic Acid, Venous 0.9 0.5 - 1.9 mmol/L  Troponin I  Result Value Ref Range   Troponin I 0.07 (HH) <0.03 ng/mL  CBG monitoring, ED  Result Value Ref Range   Glucose-Capillary 310 (H) 65 - 99 mg/dL   Dg Chest Port 1 View Result Date: 06/07/2018 CLINICAL DATA:  Possible sepsis EXAM: PORTABLE CHEST 1 VIEW COMPARISON:  01/21/2018 FINDINGS: Cardiac shadow is enlarged but again accentuated by the portable technique. The overall inspiratory effort is poor with crowding of the vascular markings. Some mild interstitial changes are again noted and stable likely of a chronic nature. Postsurgical changes are seen. IMPRESSION: Poor inspiratory effort with crowding vascular markings. No focal infiltrate is seen. Electronically Signed   By: Inez Catalina M.D.   On: 06/07/2018 14:13   Results for JORGEN, WOLFINGER (MRN [id number]) as of 06/07/2018 14:39  Ref. Range 01/08/2018 21:37 01/09/2018 00:05 01/09/2018 04:52 01/09/2018 14:12 06/07/2018 13:38  Troponin I Latest Ref Range: <0.03 ng/mL 0.03 (HH) 0.03 (HH) 0.04 (HH) 0.03 (HH) 0.07 [facility])   Results for SHAVON, ZENZ (MRN [id number]) as of 06/07/2018 14:39  Ref. Range 12/23/2017 09:05 12/24/2017 04:19 01/08/2018 17:09 06/03/2018 11:36 06/07/2018 12:57  Hemoglobin Latest Ref Range: 13.0 - 17.0 g/dL 10.6 (L) 9.9 (L) 11.4 (L) 10.2 (L) 9.0 (L)  HCT Latest Ref Range: 39.0 - 52.0 % 32.9 (L) 31.2 (L) 35.0 (L) 30.0 (L) 28.8 (L)  1335:  Troponin as above, pt denies CP and EKG unchanged from previous. H/H lower than previous, no obvious bleeding at present. BUN/Cr c/w baseline on file. No palp pulses from knees to feet, but are able to doppler DP pulses. This is c/w previous exams in Epic. T/C returned from [facility] Vascular Surgery Dr. Bridgett Larsson, case discussed, including:  HPI, pertinent PM/SHx, VS/PE, dx testing, ED course and treatment:  Requests to transfer to [facility] ED for his evaluation. Report called to EDP Dr.  Lita Mains. Family and pt agreeable with plan.      Final Clinical Impressions(s) / ED Diagnoses   Final diagnoses:  Necrotic toes ([place])  PAD (peripheral artery disease) [facility])    ED Discharge Orders    None       Francine Graven, DO 06/11/18 1255

## 2018-06-07 NOTE — Consult Note (Addendum)
Established Critical Limb Ischemia Patient   History of Present Illness   [name] is a 83 y.o. (Jan 16, 1935) male who presents with chief complaint: "left foot turning black".  This patient is known to our practice.  On his first visit to our office, he became acutely bradycardiac with respiratory distress and desaturations.  He was sent to the ED where he was admitted for CHF decompensation.  He returned a month later and was setup for an angiogram.  This demonstrated a left SFA occlusion with reconstitution of his at-the-knee popliteal artery with single vessel runoff via the peroneal artery.  His wounds were not changed at that time.  Over the last week or so he has developed frank gangrene in the left foot with chronic left foot pain.  His pain is not significantly worse per the patient.  Past Medical History:  Diagnosis Date  . Anemia   . Carotid artery disease ([place])   . CHF (congestive heart failure) ([place])   . CKD (chronic kidney disease), stage IV ([place])   . Coronary artery disease 2010   Status post coronary artery bypass grafting  . Diabetes mellitus   . Dysrhythmia   . Foot ulcer ([place])   . Hyperlipidemia   . Hypertension   . Hypothyroidism   . Myocardial infarction ([place]) 2010  . PAD (peripheral artery disease) ([place])   . Postoperative atrial fibrillation ([place])   . Thrombocytopenia ([employer])     Past Surgical History:  Procedure Laterality Date  . AORTOGRAM Left 06/03/2018   Procedure: ABDOMINAL AORTOGRAM WITH CARBON DIOXIDE LEFT LOWER EXTREMITY RUNOFF;  Surgeon: Conrad , MD;  Location: [facility];  Service: Vascular;  Laterality: Left;  . carotid insuff    . CORONARY ARTERY BYPASS GRAFT  2010   LIMA to LAD, SVG to diagonal, SVG to circumflex, marginal, SVG to posterior descending branch.    Social History   Socioeconomic History  . Marital status: Widowed    Spouse name: Not on file  . Number of children: Not on file  . Years of education: Not on file  .  Highest education level: Not on file  Occupational History  . Not on file  Social Needs  . Financial resource strain: Not on file  . Food insecurity:    Worry: Not on file    Inability: Not on file  . Transportation needs:    Medical: Not on file    Non-medical: Not on file  Tobacco Use  . Smoking status: Never Smoker  . Smokeless tobacco: Never Used  Substance and Sexual Activity  . Alcohol use: Not Currently    Alcohol/week: 0.0 oz  . Drug use: No  . Sexual activity: Not on file  Lifestyle  . Physical activity:    Days per week: Not on file    Minutes per session: Not on file  . Stress: Not on file  Relationships  . Social connections:    Talks on phone: Not on file    Gets together: Not on file    Attends religious service: Not on file    Active member of club or organization: Not on file    Attends meetings of clubs or organizations: Not on file    Relationship status: Not on file  . Intimate partner violence:    Fear of current or ex partner: Not on file    Emotionally abused: Not on file    Physically abused: Not on file    Forced  sexual activity: Not on file  Other Topics Concern  . Not on file  Social History Narrative  . Not on file    Family History  Problem Relation Age of Onset  . Heart attack Father   . Heart attack Brother     Current Facility-Administered Medications  Medication Dose Route Frequency Provider Last Rate Last Dose  . 0.9 %  sodium chloride infusion   Intravenous Continuous Molt, Bethany, DO 75 mL/hr at 06/07/18 1800    . acetaminophen (TYLENOL) tablet 650 mg  650 mg Oral Q6H PRN Molt, Bethany, DO       Or  . acetaminophen (TYLENOL) suppository 650 mg  650 mg Rectal Q6H PRN Molt, Bethany, DO      . amLODipine (NORVASC) tablet 5 mg  5 mg Oral Daily Molt, Bethany, DO      . aspirin chewable tablet 81 mg  81 mg Oral QHS Narendra, Nischal, MD      . atorvastatin (LIPITOR) tablet 40 mg  40 mg Oral QHS Molt, Bethany, DO      . heparin  ADULT infusion 100 units/mL (25000 units/271mL sodium chloride 0.45%)  1,300 Units/hr Intravenous Continuous Molt, Bethany, DO 13 mL/hr at 06/07/18 1627 1,300 Units/hr at 06/07/18 1627  . HYDROmorphone (DILAUDID) injection 1 mg  1 mg Intravenous Q3H PRN Molt, Bethany, DO   1 mg at 06/07/18 2019  . [START ON 06/08/2018] insulin aspart (novoLOG) injection 0-15 Units  0-15 Units Subcutaneous TID WC Molt, Bethany, DO      . insulin glargine (LANTUS) injection 15 Units  15 Units Subcutaneous BID Molt, Bethany, DO      . [START ON 06/08/2018] levothyroxine (SYNTHROID, LEVOTHROID) tablet 50 mcg  50 mcg Oral QAC breakfast Molt, Bethany, DO      . ondansetron (ZOFRAN) tablet 4 mg  4 mg Oral Q6H PRN Molt, Bethany, DO       Or  . ondansetron (ZOFRAN) injection 4 mg  4 mg Intravenous Q6H PRN Molt, Bethany, DO      . piperacillin-tazobactam (ZOSYN) IVPB 3.375 g  3.375 g Intravenous Q8H Rumbarger, Rachel L, RPH      . polyethylene glycol (MIRALAX / GLYCOLAX) packet 17 g  17 g Oral Daily PRN Molt, Bethany, DO         No Known Allergies   REVIEW OF SYSTEMS (negative unless checked):   Cardiac:  []  Chest pain or chest pressure? [x]  Shortness of breath upon activity? []  Shortness of breath when lying flat? []  Irregular heart rhythm?  Vascular:  [x]  Pain in calf, thigh, or hip brought on by walking? [x]  Pain in feet at night that wakes you up from your sleep? []  Blood clot in your veins? []  Leg swelling?  Pulmonary:  []  Oxygen at home? []  Productive cough? []  Wheezing?  Neurologic:  [x]  Sudden weakness in arms or legs? []  Sudden numbness in arms or legs? []  Sudden onset of difficult speaking or slurred speech? []  Temporary loss of vision in one eye? []  Problems with dizziness?  Gastrointestinal:  []  Blood in stool? []  Vomited blood?  Genitourinary:  []  Burning when urinating? []  Blood in urine?  Psychiatric:  []  Major depression  Hematologic:  []  Bleeding problems? []  Problems with  blood clotting?  Dermatologic:  []  Rashes or ulcers?  Constitutional:  []  Fever or chills?  Ear/Nose/Throat:  []  Change in hearing? []  Nose bleeds? []  Sore throat?  Musculoskeletal:  []  Back pain? []  Joint pain? []  Muscle pain?  Physical Examination   Vitals:   06/07/18 1730 06/07/18 1745 06/07/18 1800 06/07/18 1839  BP: 124/72 134/80 132/64 (!) 136/58  Pulse: 68 76 70 71  Resp: 16 (!) 22 18 15   Temp:    99.4 F (37.4 C)  TempSrc:    Oral  SpO2: 100% 99% 100% 99%  Weight:      Height:       Body mass index is 31.78 kg/m.  General Alert, O x 3, WD, NAD  Pulmonary Sym exp, Decreased B air movt, rales on B  Cardiac Irregularly, irregular rate and rhythm, no Murmurs, No rubs, No S3,S4  Vascular Vessel Right Left  Radial Palpable Palpable  Brachial Palpable Palpable  Carotid Palpable, No Bruit Palpable, No Bruit  Aorta Not palpable N/A  Femoral Palpable Palpable  Popliteal Not palpable Not palpable  PT Not palpable Not palpable  DP Not palpable Not palpable    Gastro- intestinal soft, non-distended, non-tender to palpation, No guarding or rebound, no HSM, no masses, no CVAT B, No palpable prominent aortic pulse,    Musculo- skeletal M/S 5/5 throughout except L foot DF/PF 3-4/5, Extremities without ischemic changes except L 3rd-5th toe dry gangrene, No edema present, No visible varicosities , No Lipodermatosclerosis present, L leg warm down to calf  Neurologic Cranial nerves grossly, Pain and light touch intact in extremities , Motor exam as listed above   Radiology     Dg Chest Port 1 View  Result Date: 06/07/2018 CLINICAL DATA:  Possible sepsis EXAM: PORTABLE CHEST 1 VIEW COMPARISON:  01/21/2018 FINDINGS: Cardiac shadow is enlarged but again accentuated by the portable technique. The overall inspiratory effort is poor with crowding of the vascular markings. Some mild interstitial changes are again noted and stable likely of a chronic nature. Postsurgical  changes are seen. IMPRESSION: Poor inspiratory effort with crowding vascular markings. No focal infiltrate is seen. Electronically Signed   By: Inez Catalina M.D.   On: 06/07/2018 14:13    Laboratory   CBC CBC Latest Ref Rng & Units 06/07/2018 06/03/2018 01/08/2018  WBC 4.0 - 10.5 K/uL 17.1(H) - 7.2  Hemoglobin 13.0 - 17.0 g/dL 9.0(L) 10.2(L) 11.4(L)  Hematocrit 39.0 - 52.0 % 28.8(L) 30.0(L) 35.0(L)  Platelets 150 - 400 K/uL 274 - 149(L)    BMP BMP Latest Ref Rng & Units 06/07/2018 06/03/2018 01/10/2018  Glucose 65 - 99 mg/dL 333(H) 99 174(H)  BUN 6 - 20 mg/dL 45(H) 53(H) 47(H)  Creatinine 0.61 - 1.24 mg/dL 2.58(H) 2.60(H) 2.71(H)  Sodium 135 - 145 mmol/L 133(L) 137 138  Potassium 3.5 - 5.1 mmol/L 3.4(L) 4.3 3.7  Chloride 101 - 111 mmol/L 100(L) 100(L) 101  CO2 22 - 32 mmol/L 23 - 25  Calcium 8.9 - 10.3 mg/dL 8.1(L) - 9.1    Coagulation Lab Results  Component Value Date   INR 1.32 06/07/2018   INR 1.11 12/24/2017   INR 1.1 08/20/2009   No results found for: PTT  Lipids    Component Value Date/Time   CHOL 126 01/09/2018 0452   TRIG 89 01/09/2018 0452   HDL 34 (L) 01/09/2018 0452   CHOLHDL 3.7 01/09/2018 0452   VLDL 18 01/09/2018 0452   LDLCALC 74 01/09/2018 0452   HgA1c 8.3   Medical Decision Making   [name] is a 83 y.o. male who presents with: L foot dry gangrene, known multilevel LLE PAD, IDDM, CHF, afib, CKD Stage 3-4   Pt has gone months without progression into gangrene, so I  am not certain was caused his present progression.  His L foot gangrene may represent diabetic digital artery disease, so even with successful revascularization, the left foot may not be salvageable.  Angiogram suggested his only revascularization option is a left CFA to BK pop bypass.    Would also get Dr. Sharol Given to consult to see if he thinks he can salvage a L TMA.  If he can't salvage a TMA, no point in fem-pop bypass as he would need a L BKA anyway.  Patient need medical  optimization and cardiology evaluation ASAP.  I doubt this patient can clear Anesthesiology currently, so he is NOT a surgical candidate currently.  I discussed in depth with the patient the nature of atherosclerosis, and emphasized the importance of maximal medical management including strict control of blood pressure, blood glucose, and lipid levels, antiplatelet agents, obtaining regular exercise, and cessation of smoking.    The patient is aware that without maximal medical management the underlying atherosclerotic disease process will progress, limiting the benefit of any interventions.  The patient is currently on a statin: Lipitor.   The patient is currently on an anti-platelet: ASA.   Thank you for allowing [place] to participate in this patient's care.   Adele Barthel, MD, FACS [facility] Office: [phone] Pager: [phone]

## 2018-06-08 ENCOUNTER — Inpatient Hospital Stay (HOSPITAL_COMMUNITY): Payer: Medicare HMO

## 2018-06-08 DIAGNOSIS — I96 Gangrene, not elsewhere classified: Secondary | ICD-10-CM

## 2018-06-08 DIAGNOSIS — I2510 Atherosclerotic heart disease of native coronary artery without angina pectoris: Secondary | ICD-10-CM

## 2018-06-08 DIAGNOSIS — Z79899 Other long term (current) drug therapy: Secondary | ICD-10-CM

## 2018-06-08 DIAGNOSIS — I739 Peripheral vascular disease, unspecified: Secondary | ICD-10-CM

## 2018-06-08 DIAGNOSIS — I5030 Unspecified diastolic (congestive) heart failure: Secondary | ICD-10-CM

## 2018-06-08 DIAGNOSIS — Z7982 Long term (current) use of aspirin: Secondary | ICD-10-CM

## 2018-06-08 DIAGNOSIS — D649 Anemia, unspecified: Secondary | ICD-10-CM

## 2018-06-08 DIAGNOSIS — Z951 Presence of aortocoronary bypass graft: Secondary | ICD-10-CM

## 2018-06-08 DIAGNOSIS — I130 Hypertensive heart and chronic kidney disease with heart failure and stage 1 through stage 4 chronic kidney disease, or unspecified chronic kidney disease: Secondary | ICD-10-CM

## 2018-06-08 DIAGNOSIS — Z794 Long term (current) use of insulin: Secondary | ICD-10-CM

## 2018-06-08 DIAGNOSIS — N183 Chronic kidney disease, stage 3 (moderate): Secondary | ICD-10-CM

## 2018-06-08 DIAGNOSIS — Z79890 Hormone replacement therapy (postmenopausal): Secondary | ICD-10-CM

## 2018-06-08 DIAGNOSIS — A419 Sepsis, unspecified organism: Secondary | ICD-10-CM

## 2018-06-08 DIAGNOSIS — E1152 Type 2 diabetes mellitus with diabetic peripheral angiopathy with gangrene: Principal | ICD-10-CM

## 2018-06-08 DIAGNOSIS — E1122 Type 2 diabetes mellitus with diabetic chronic kidney disease: Secondary | ICD-10-CM

## 2018-06-08 DIAGNOSIS — Z01810 Encounter for preprocedural cardiovascular examination: Secondary | ICD-10-CM

## 2018-06-08 DIAGNOSIS — I4891 Unspecified atrial fibrillation: Secondary | ICD-10-CM

## 2018-06-08 DIAGNOSIS — E785 Hyperlipidemia, unspecified: Secondary | ICD-10-CM

## 2018-06-08 DIAGNOSIS — E039 Hypothyroidism, unspecified: Secondary | ICD-10-CM

## 2018-06-08 LAB — CBC
HCT: 25 % — ABNORMAL LOW (ref 39.0–52.0)
HEMATOCRIT: 24.9 % — AB (ref 39.0–52.0)
HEMOGLOBIN: 7.6 g/dL — AB (ref 13.0–17.0)
Hemoglobin: 7.7 g/dL — ABNORMAL LOW (ref 13.0–17.0)
MCH: 28.9 pg (ref 26.0–34.0)
MCH: 28.8 pg (ref 26.0–34.0)
MCHC: 30.4 g/dL (ref 30.0–36.0)
MCHC: 30.9 g/dL (ref 30.0–36.0)
MCV: 95.1 fL (ref 78.0–100.0)
MCV: 93.3 fL (ref 78.0–100.0)
PLATELETS: 217 10*3/uL (ref 150–400)
Platelets: 216 10*3/uL (ref 150–400)
RBC: 2.67 MIL/uL — ABNORMAL LOW (ref 4.22–5.81)
RBC: 2.63 MIL/uL — AB (ref 4.22–5.81)
RDW: 13.6 % (ref 11.5–15.5)
RDW: 13.5 % (ref 11.5–15.5)
WBC: 12.1 10*3/uL — ABNORMAL HIGH (ref 4.0–10.5)
WBC: 11.6 10*3/uL — AB (ref 4.0–10.5)

## 2018-06-08 LAB — PROTIME-INR
INR: 1.37
Prothrombin Time: 16.7 seconds — ABNORMAL HIGH (ref 11.4–15.2)

## 2018-06-08 LAB — COMPREHENSIVE METABOLIC PANEL
ALBUMIN: 1.9 g/dL — AB (ref 3.5–5.0)
ALT: 30 U/L (ref 0–44)
ANION GAP: 9 (ref 5–15)
AST: 36 U/L (ref 15–41)
Alkaline Phosphatase: 81 U/L (ref 38–126)
BILIRUBIN TOTAL: 0.8 mg/dL (ref 0.3–1.2)
BUN: 40 mg/dL — ABNORMAL HIGH (ref 8–23)
CALCIUM: 8.1 mg/dL — AB (ref 8.9–10.3)
CO2: 23 mmol/L (ref 22–32)
Chloride: 103 mmol/L (ref 98–111)
Creatinine, Ser: 2.5 mg/dL — ABNORMAL HIGH (ref 0.61–1.24)
GFR calc Af Amer: 26 mL/min — ABNORMAL LOW (ref >60)
GFR, EST NON AFRICAN AMERICAN: 23 mL/min — AB (ref >60)
GLUCOSE: 314 mg/dL — AB (ref 70–99)
Potassium: 3.6 mmol/L (ref 3.5–5.1)
Sodium: 135 mmol/L (ref 135–145)
TOTAL PROTEIN: 5.8 g/dL — AB (ref 6.5–8.1)

## 2018-06-08 LAB — ECHOCARDIOGRAM COMPLETE
Height: 65 in
WEIGHTICAEL: 2874.8 [oz_av]

## 2018-06-08 LAB — HEPARIN LEVEL (UNFRACTIONATED)
HEPARIN UNFRACTIONATED: 0.34 [IU]/mL (ref 0.30–0.70)
Heparin Unfractionated: 0.37 IU/mL (ref 0.30–0.70)

## 2018-06-08 LAB — GLUCOSE, CAPILLARY
GLUCOSE-CAPILLARY: 211 mg/dL — AB (ref 70–99)
GLUCOSE-CAPILLARY: 272 mg/dL — AB (ref 70–99)
Glucose-Capillary: 200 mg/dL — ABNORMAL HIGH (ref 70–99)
Glucose-Capillary: 184 mg/dL — ABNORMAL HIGH (ref 70–99)

## 2018-06-08 LAB — MRSA PCR SCREENING: MRSA by PCR: NEGATIVE

## 2018-06-08 MED ORDER — JUVEN PO PACK
1.0000 | PACK | Freq: Two times a day (BID) | ORAL | Status: DC
  Administered 2018-06-09 – 2018-06-15 (×11): 1 via ORAL
  Filled 2018-06-08 (×15): qty 1

## 2018-06-08 MED ORDER — METOPROLOL TARTRATE 12.5 MG HALF TABLET
12.5000 mg | ORAL_TABLET | Freq: Two times a day (BID) | ORAL | Status: DC
  Administered 2018-06-08 – 2018-06-14 (×12): 12.5 mg via ORAL
  Filled 2018-06-08 (×14): qty 1

## 2018-06-08 MED ORDER — GLUCERNA SHAKE PO LIQD: 237.0000 mL | Freq: Three times a day (TID) | ORAL | Status: DC

## 2018-06-08 MED ORDER — HYDROMORPHONE HCL 1 MG/ML IJ SOLN
0.5000 mg | INTRAMUSCULAR | Status: DC | PRN
  Administered 2018-06-08 – 2018-06-14 (×23): 0.5 mg via INTRAVENOUS
  Filled 2018-06-08 (×23): qty 1

## 2018-06-08 MED ORDER — GLUCERNA SHAKE PO LIQD
237.0000 mL | Freq: Two times a day (BID) | ORAL | Status: DC
  Administered 2018-06-08 – 2018-06-11 (×3): 237 mL via ORAL

## 2018-06-08 MED ORDER — OXYCODONE HCL 5 MG PO TABS
5.0000 mg | ORAL_TABLET | Freq: Four times a day (QID) | ORAL | Status: DC
  Administered 2018-06-08 – 2018-06-09 (×4): 5 mg via ORAL
  Filled 2018-06-08 (×4): qty 1

## 2018-06-08 MED ORDER — INSULIN GLARGINE 100 UNIT/ML ~~LOC~~ SOLN
20.0000 [IU] | Freq: Two times a day (BID) | SUBCUTANEOUS | Status: DC
  Administered 2018-06-08 – 2018-06-09 (×2): 20 [IU] via SUBCUTANEOUS
  Filled 2018-06-08 (×2): qty 0.2

## 2018-06-08 MED ORDER — AMLODIPINE BESYLATE 5 MG PO TABS
2.5000 mg | ORAL_TABLET | Freq: Every day | ORAL | Status: DC
  Administered 2018-06-09 – 2018-06-15 (×7): 2.5 mg via ORAL
  Filled 2018-06-08 (×7): qty 1

## 2018-06-08 NOTE — Progress Notes (Signed)
  Echocardiogram 2D Echocardiogram has been performed.  Darlina Sicilian M 06/08/2018, 11:01 AM

## 2018-06-08 NOTE — Progress Notes (Signed)
ANTICOAGULATION CONSULT NOTE   Pharmacy Consult for heparin Indication: arterial occlusion  No Known Allergies  Patient Measurements: Height: 5\' 5"  (165.1 cm) Weight: 191 lb (86.6 kg) IBW/kg (Calculated) : 61.5 Heparin Dosing Weight: 80kg  Vital Signs: Temp: 98.3 F (36.8 C) (06/24 2153) Temp Source: Oral (06/24 2153) BP: 97/54 (06/24 2153) Pulse Rate: 18 (06/24 2153)  Labs: Recent Labs    06/07/18 1257 06/07/18 1338 06/08/18 0049  HGB 9.0*  --  Victor.Victor*  HCT 28.8*  --  24.9*  PLT 274  --  216  LABPROT 16.2*  --  16.Victor*  INR 1.32  --  1.37  HEPARINUNFRC  --   --  0.37  CREATININE 2.58*  --  2.50*  TROPONINI  --  0.07*  --     Estimated Creatinine Clearance: 23.4 mL/min (A) (by C-G formula based on SCr of 2.5 mg/dL (H)).   Medical History: Past Medical History:  Diagnosis Date  . Anemia   . Carotid artery disease ([place])   . CHF (congestive heart failure) ([place])   . CKD (chronic kidney disease), stage IV ([place])   . Coronary artery disease 2010   Status post coronary artery bypass grafting  . Diabetes mellitus   . Dysrhythmia   . Foot ulcer ([place])   . Hyperlipidemia   . Hypertension   . Hypothyroidism   . Myocardial infarction ([place]) 2010  . PAD (peripheral artery disease) ([place])   . Postoperative atrial fibrillation ([place])   . Thrombocytopenia ([facility])     Medications:  Infusions:  . sodium chloride 75 mL/hr at 06/07/18 1800  . heparin 1,300 Units/hr (06/07/18 1627)  . piperacillin-tazobactam (ZOSYN)  IV 3.375 g (06/07/18 2230)    Assessment: [name] presented to the ED with foot pain. To start IV heparin for arterial occlusion.  Hgb is low at 9 and platelets are WNL. He is not on anticoagulation PTA.S/p abdominal aortogram 6/20.  Initial heparin level 0.37 units/ml  Goal of Therapy:  Heparin level 0.3-0.Victor units/ml Monitor platelets by anticoagulation protocol: Yes   Plan:  Continue Heparin gtt 1300 units/hr Check an 8 hr heparin level Daily heparin level  and CBC  Caresse Sedivy Poteet 06/08/2018,2:04 AM

## 2018-06-08 NOTE — Progress Notes (Signed)
Initial Nutrition Assessment  DOCUMENTATION CODES:   Not applicable  INTERVENTION:   -Glucerna Shake po BID, each supplement provides 220 kcal and 10 grams of protein -1 packet Juven BID, each packet provides 80 calories, 8 grams of carbohydrate, and 14 grams of amino acids; supplement contains CaHMB, glutamine, and arginine, to promote wound healing  NUTRITION DIAGNOSIS:   Increased nutrient needs related to wound healing as evidenced by estimated needs.  GOAL:   Patient will meet greater than or equal to 90% of their needs  MONITOR:   PO intake, Supplement acceptance, Labs, Weight trends, Skin, I & O's  REASON FOR ASSESSMENT:   Malnutrition Screening Tool    ASSESSMENT:   Mr Nipp is an 83 yo M with Hx of PAD, CHF (EF 60-65%), CKD III, A-fib, CAD (s/p CABG), DM, HTN, HLD, Hypothyroidism, and Anemia who presented with pain and necrosis of his distal left lower extremity.   Pt admitted with cellulits and atherosclerosis and native arteries of extremities with gangrene of lt leg.   Reviewed [facility]VS note from 06/07/18; angiogram suggests only revascularization option is lt CFA to BK popliteal bypass. Orthopedics has been consulted for possility of lt TMA vs lt BKA.   Spoke with pt at bedside, who reports a general decline in health over the past 3-4 weeks, which he relates to foot pain. He shares that his appetite comes and goes- some days he will eat well and other days he only consumes a few bites at each meal. Prior to developing foot pain, pt reports he was consuming 2-3 meals per day and was cooking on his own. He now has to rely on his family for meal preparation and they often bring him food from restaurants, such as [employer]. Pt states "I'll eat a few bites and then finish the rest the next day' that's unusual for me". Observed lunch meal tray- pt consumed 75% of tea, 1/2 hamburger and 1/3 of mashed potatoes. Pt estimates he consumed a little over half of his breakfast this  morning.   Pt endorses wt loss, which he suspects all occurred over the past month (roughly 12#). Noted pt has experienced 11.8% wt loss over the past 6 months, however, unsure fo accuracy of wts (per wt hx, pt has experienced a 6.2% wt loss over the past 5 days). Pt shares with this RD that his mobility has declined significantly since his popliteal bypass last week- he is unable to stand and needs assistance with transfers.   Discussed with pt importance of nutrition to assist with healing. Pt amenable to supplements.   Last Hgb A1c: 8.3 (05/31/18). PTA DM medications are 40 units insulin glargine BID and 5-15 units insulin aspart TID.   Labs reviewed: CBG: 200 (inpatient orders for glycemic control are 0-15 units insulin aspart TID with meals and 15 units insulin glargine BID).   NUTRITION - FOCUSED PHYSICAL EXAM:    Most Recent Value  Orbital Region  No depletion  Upper Arm Region  No depletion  Thoracic and Lumbar Region  No depletion  Buccal Region  No depletion  Temple Region  No depletion  Clavicle Bone Region  No depletion  Clavicle and Acromion Bone Region  No depletion  Scapular Bone Region  No depletion  Dorsal Hand  No depletion  Patellar Region  Mild depletion  Anterior Thigh Region  Mild depletion  Posterior Calf Region  Mild depletion  Edema (RD Assessment)  Mild  Hair  Reviewed  Eyes  Reviewed  Mouth  Reviewed  Skin  Reviewed  Nails  Reviewed       Diet Order:   Diet Order           Diet heart healthy/carb modified Room service appropriate? Yes; Fluid consistency: Thin  Diet effective now          EDUCATION NEEDS:   Education needs have been addressed  Skin:  Skin Assessment: Skin Integrity Issues: Skin Integrity Issues:: Incisions Incisions: rt groin; 3rd, 4th, and 5th lt necrotic toes with surrounding cellulits  Last BM:  06/07/18  Height:   Ht Readings from Last 1 Encounters:  06/07/18 5\' 5"  (1.651 m)    Weight:   Wt Readings from Last 1  Encounters:  06/08/18 179 lb 10.8 oz (81.5 kg)    Ideal Body Weight:  61.8 kg  BMI:  Body mass index is 29.9 kg/m.  Estimated Nutritional Needs:   Kcal:  1850-2050  Protein:  90-105 grams  Fluid:  > 1.8 L    Maximilian Tallo A. Jimmye Norman, RD, LDN, CDE Pager: [phone] After hours Pager: [phone]

## 2018-06-08 NOTE — Progress Notes (Signed)
Inpatient Diabetes Program Recommendations  AACE/ADA: New Consensus Statement on Inpatient Glycemic Control (2015)  Target Ranges:  Prepandial:   less than 140 mg/dL      Peak postprandial:   less than 180 mg/dL (1-2 hours)      Critically ill patients:  140 - 180 mg/dL   Lab Results  Component Value Date   GLUCAP 200 (H) 06/08/2018   HGBA1C 8.3 (H) 05/31/2018    Review of Glycemic ControlResults for YUVAN, MEDINGER (MRN [id number]) as of 06/08/2018 14:24  Ref. Range 06/07/2018 12:57 06/07/2018 21:52 06/08/2018 06:17 06/08/2018 11:29  Glucose-Capillary Latest Ref Range: 70 - 99 mg/dL 310 (H) 326 (H) 211 (H) 200 (H)    Diabetes history: Type 2 DM  Outpatient Diabetes medications: Lantus 40 units bid, Novolog 5-15 units tid with meals Current orders for Inpatient glycemic control:  Novolog moderate tid with meals, Lantus 15 units bid  Inpatient Diabetes Program Recommendations:   Please consider increasing Lantus to 20 units bid.  Also consider adding Novolog meal coverage 4 units tid with meals (hold if patient eats less than 50%).   Thanks,  Adah Perl, RN, BC-ADM Inpatient Diabetes Coordinator Pager [phone] (8a-5p)

## 2018-06-08 NOTE — Progress Notes (Signed)
Date: 06/08/2018  Patient name: [name]  Medical record number: [id number]  Date of birth: Mar 13, 1935   I have seen and evaluated [name] and discussed their care with the Residency Team.  In brief, patient is an 83 year old male with a past medical history of peripheral arterial disease, CHF with preserved ejection fraction, CKD stage III, A. fib, CAD status post CABG, diabetes, hypertension, hyperlipidemia, hypothyroidism and anemia who presented with worsening left foot pain and associated gangrene of his third fourth and fifth toes.  Patient has a history of PAD and follows with vascular surgery closely.  He had a recent angiography of his left lower extremity on June 20 which showed occluded left SFA, popliteal artery, anterior tibial and posterior tibial arteries.  He was initially scheduled to follow-up with cardiology as an outpatient for cardiac risk stratification and follow-up with vascular surgery in 3 to 4 weeks for possible intervention.  However, the last 2 to 3 days he noted progressively worsening pain in his left foot as well as necrosis over his left third fourth and fifth digits and a foul-smelling odor.  No chest pain, no palpitations, no shortness of breath, no diaphoresis, no headache, no blurry vision, no nausea or vomiting, no abdominal pain, no diarrhea.  Today patient still complains of persistent left foot pain which is only mildly relieved by his current pain regimen.  PMHx, Fam Hx, and/or Soc Hx : As per resident admit note  Vitals:   06/07/18 2153 06/08/18 0534  BP: (!) 97/54 (!) 121/58  Pulse: (!) 18 73  Resp: (!) 22 18  Temp: 98.3 F (36.8 C) 98.4 F (36.9 C)  SpO2: 97% 96%   General: Awake, alert, oriented x3, NAD CVs: Regular rate and rhythm, normal heart sounds Lungs: CTA bilaterally Abdomen: Soft, nontender, nondistended, normoactive bowel sounds Extremities: Left third, fourth, fifth toes with necrosis and foul-smelling odor with  erythema extending over the dorsum of his left foot   Assessment and Plan: I have seen and evaluated the patient as outlined above. I agree with the formulated Assessment and Plan as detailed in the residents' note, with the following changes:   1.  Severe PAD with gangrenous third fourth and fifth toes and surrounding cellulitis: -Patient presented with progressively worsening pain in his left foot with associated gangrene in his left third fourth and fifth toes in the setting of a history of severe PAD with recent angiography showing occluded left SFA, popliteal, anterior and posterior tibial arteries.  Patient was also noted to have leukocytosis and is surrounding cellulitis on exam. -Vascular surgery follow-up and recommendations appreciated -It is uncertain if his left foot is salvageable or not at this time -We will obtain Ortho consult with Dr. Sharol Given to see if he thinks he can salvage his left foot with the left transmetatarsal amputation.  He is unable to salvage the foot and there would be no indication for femoropopliteal bypass as he would need a left BKA. -Continue with heparin drip for now -Continue pain control -Patient will need cardiac clearance prior to any procedures.  Will obtain cardiology consult per vascular surgery request -Leukocytosis slowly improving today.  Blood cultures remain negative to date.  We will continue with IV Zosyn for now -We will follow-up 2D echo -Of note, patient's hemoglobin is slowly trending down and is 7.7 this morning with no evidence of an active bleed.  This may be secondary to dilution given he received IV fluids on admission.  Will continue  to monitor closely  Aldine Contes, MD 6/25/201911:40 AM

## 2018-06-08 NOTE — Consult Note (Signed)
Cardiology Consultation:   Patient ID: [name]; [id number]; Dec 02, 1935   Admit date: 06/07/2018 Date of Consult: 06/08/2018  Primary Care Provider: Medicine, [facility] Primary Cardiologist: Kate Sable, MD (previously Dr. Percival Spanish) Primary Electrophysiologist:     Patient Profile:   [name] is a 83 y.o. male with a hx of PAD, chronic diastolic heart failure, CKD stage IV, Afib, CAD s/p CABG (2010), IDDM, HTN, HLD, hypothyroidism, and anemia who is being seen today for the evaluation of cardiac clearance for amputation at the request of Dr. Dareen Piano.  History of Present Illness:   [name] follows with Dr. Bronson Ing and was last seen in clinic on 03/11/16. This was in follow up to a hospitalization for hypotension and urosepsis with troponin elevation felt to be demand ischemia. Echo at that time with LVEF 60-65% and grade 1 DD. No further cardiac workup was initiated while hospitalized. At follow up, he was feeling well and weighing daily, no DOE (with minimal baseline activity). He denied further chest pain. He is very sedentary at baseline. Carotid dopplers for bilateral bruits with right and left 50-69% stenosis (03/19/16).    He has recently seen in the ED for cellulitis (12/2017) and 01/10/18 for hypoxia thought to be due to acute on chronic diastolic heart failure. Cardiology was not consulted. Echo performed 12/2017 showed normal LVEF.   He has not followed up with [facility] since 2017. He presented to [facility] following angiography in setting of LLE ulcers (06/03/18) that showed occluded left SFA, popliteal, anterior tibial, and posterior tibial arteries. Plan was to get cardiac clearance from cardiology with vascular follow up. However, patient had worsening of pain and worsening of pain prompting him to present to the ED. Vascular surgery was consulted and request cardiac clearance prior to amputation.   On my interview, the patient denies chest pain,  shortness of breath, and lower extremity swelling. He reports baseline orthopnea that he's had "for years."  He has not followed with cardiology since 2017 reports no ischemic evaluation since his stress test. He is unable to complete 4.0 METS. He previously was able to walk to the mailbox, but states his foot pain prevents him from walking anywhere. It does not sound as though he has been able to complete 4.0 METS in many years.    Past Medical History:  Diagnosis Date  . Anemia   . Carotid artery disease ([place])   . CHF (congestive heart failure) ([place])   . CKD (chronic kidney disease), stage IV ([place])   . Coronary artery disease 2010   Status post coronary artery bypass grafting  . Diabetes mellitus   . Dysrhythmia   . Foot ulcer ([place])   . Hyperlipidemia   . Hypertension   . Hypothyroidism   . Myocardial infarction ([place]) 2010  . PAD (peripheral artery disease) ([place])   . Postoperative atrial fibrillation ([place])   . Thrombocytopenia ([place])     Past Surgical History:  Procedure Laterality Date  . AORTOGRAM Left 06/03/2018   Procedure: ABDOMINAL AORTOGRAM WITH CARBON DIOXIDE LEFT LOWER EXTREMITY RUNOFF;  Surgeon: [facility], MD;  Location: [place];  Service: Vascular;  Laterality: Left;  . carotid insuff    . CORONARY ARTERY BYPASS GRAFT  2010   LIMA to LAD, SVG to diagonal, SVG to circumflex, marginal, SVG to posterior descending branch.     Home Medications:  Prior to Admission medications   Medication Sig Start Date End Date Taking? Authorizing Provider  amLODipine (NORVASC) 5  MG tablet Take 5 mg by mouth daily.   Yes [provider]  aspirin 81 MG tablet Take 81 mg by mouth at bedtime.    Yes [provider]  atorvastatin (LIPITOR) 40 MG tablet Take 40 mg by mouth at bedtime.  01/30/16  Yes [provider]  furosemide (LASIX) 40 MG tablet Take 1 tablet (40 mg total) by mouth 2 (two) times daily. 01/10/18  Yes Patrecia Pour, Christean Grief, MD  insulin aspart  (NOVOLOG) 100 UNIT/ML injection Inject 5-15 Units into the skin 3 (three) times daily as needed for high blood sugar (CBG >200).   Yes [provider]  insulin glargine (LANTUS) 100 UNIT/ML injection Inject 40 Units into the skin 2 (two) times daily.   Yes [provider]  levothyroxine (SYNTHROID, LEVOTHROID) 50 MCG tablet Take 50 mcg by mouth daily.   Yes [provider]    Inpatient Medications: Scheduled Meds: . amLODipine  5 mg Oral Daily  . aspirin  81 mg Oral QHS  . atorvastatin  40 mg Oral QHS  . insulin aspart  0-15 Units Subcutaneous TID WC  . insulin glargine  15 Units Subcutaneous BID  . levothyroxine  50 mcg Oral QAC breakfast  . oxyCODONE  5 mg Oral Q6H   Continuous Infusions: . sodium chloride 75 mL/hr at 06/07/18 1800  . heparin 1,300 Units/hr (06/08/18 0626)  . piperacillin-tazobactam (ZOSYN)  IV 3.375 g (06/08/18 0622)   PRN Meds: acetaminophen **OR** acetaminophen, HYDROmorphone (DILAUDID) injection, ondansetron **OR** ondansetron (ZOFRAN) IV, polyethylene glycol  Allergies:   No Known Allergies  Social History:   Social History   Socioeconomic History  . Marital status: Widowed    Spouse name: Not on file  . Number of children: Not on file  . Years of education: Not on file  . Highest education level: Not on file  Occupational History  . Not on file  Social Needs  . Financial resource strain: Not on file  . Food insecurity:    Worry: Not on file    Inability: Not on file  . Transportation needs:    Medical: Not on file    Non-medical: Not on file  Tobacco Use  . Smoking status: Never Smoker  . Smokeless tobacco: Never Used  Substance and Sexual Activity  . Alcohol use: Not Currently    Alcohol/week: 0.0 oz  . Drug use: No  . Sexual activity: Not on file  Lifestyle  . Physical activity:    Days per week: Not on file    Minutes per session: Not on file  . Stress: Not on file  Relationships  . Social connections:     Talks on phone: Not on file    Gets together: Not on file    Attends religious service: Not on file    Active member of club or organization: Not on file    Attends meetings of clubs or organizations: Not on file    Relationship status: Not on file  . Intimate partner violence:    Fear of current or ex partner: Not on file    Emotionally abused: Not on file    Physically abused: Not on file    Forced sexual activity: Not on file  Other Topics Concern  . Not on file  Social History Narrative  . Not on file    Family History:    Family History  Problem Relation Age of Onset  . Heart attack Father   . Heart attack Brother  ROS:  Please see the history of present illness.   All other ROS reviewed and negative.     Physical Exam/Data:   Vitals:   06/07/18 1800 06/07/18 1839 06/07/18 2153 06/08/18 0534  BP: 132/64 (!) 136/58 (!) 97/54 (!) 121/58  Pulse: 70 71 (!) 18 73  Resp: 18 15 (!) 22 18  Temp:  99.4 F (37.4 C) 98.3 F (36.8 C) 98.4 F (36.9 C)  TempSrc:  Oral Oral Oral  SpO2: 100% 99% 97% 96%  Weight:    179 lb 10.8 oz (81.5 kg)  Height:        Intake/Output Summary (Last 24 hours) at 06/08/2018 1445 Last data filed at 06/08/2018 0800 Gross per 24 hour  Intake 552.96 ml  Output 300 ml  Net 252.96 ml   Filed Weights   06/07/18 1245 06/08/18 0534  Weight: 191 lb (86.6 kg) 179 lb 10.8 oz (81.5 kg)   Body mass index is 29.9 kg/m.  General:  Well nourished, well developed, in no acute distress HEENT: normal Neck: no JVD Vascular: bilateral bruits Cardiac:  Irregular rhythm, regular rate, no murmur appreciated Lungs:  clear to auscultation bilaterally, no wheezing, rhonchi or rales, diminished in bases, poor respiratory effort Abd: soft, nontender, no hepatomegaly  Ext: no edema, gangrenous left three toes, pulses not palpable Musculoskeletal:  No deformities except left foot (above), BUE and BLE strength normal and equal Skin: warm and dry  Neuro:   CNs 2-12 intact, no focal abnormalities noted Psych:  Normal affect   EKG:  The EKG was personally reviewed and demonstrates:  Sinus with posterior fascicular block, Q waves inferior leads and V6, poor R wave progression Telemetry:  Telemetry was personally reviewed and demonstrates:  Morphologically different p waves suggesting wandering pacemaker  Relevant CV Studies:  Echo 12/23/17: Study Conclusions - Left ventricle: The cavity size was normal. Wall thickness was   increased in a pattern of moderate LVH. Systolic function was   normal. The estimated ejection fraction was in the range of 60%   to 65%. The study is not technically sufficient to allow   evaluation of LV diastolic function. - Aortic valve: Moderately calcified annulus. Trileaflet;   moderately thickened leaflets. Valve area (VTI): 3.78 cm^2. Valve   area (Vmax): 3.12 cm^2. - Mitral valve: Moderately to severely calcified annulus. Normal   thickness leaflets . - Technically difficult study. Echocontrast was used to enhance   visualization.   Laboratory Data:  Chemistry Recent Labs  Lab 06/03/18 1136 06/07/18 1257 06/08/18 0049  NA 137 133* 135  K 4.3 3.4* 3.6  CL 100* 100* 103  CO2  --  23 23  GLUCOSE 99 333* 314*  BUN 53* 45* 40*  CREATININE 2.60* 2.58* 2.50*  CALCIUM  --  8.1* 8.1*  GFRNONAA  --  22* 23*  GFRAA  --  25* 26*  ANIONGAP  --  10 9    Recent Labs  Lab 06/07/18 1257 06/08/18 0049  PROT 6.9 5.8*  ALBUMIN 2.5* 1.9*  AST 42* 36  ALT 33 30  ALKPHOS 91 81  BILITOT 1.4* 0.8   Hematology Recent Labs  Lab 06/07/18 1257 06/08/18 0049 06/08/18 1109  WBC 17.1* 12.1* 11.6*  RBC 3.10* 2.67* 2.63*  HGB 9.0* 7.7* 7.6*  HCT 28.8* 24.9* 25.0*  MCV 92.9 93.3 95.1  MCH 29.0 28.8 28.9  MCHC 31.3 30.9 30.4  RDW 13.9 13.5 13.6  PLT 274 216 217   Cardiac Enzymes Recent Labs  Lab  06/07/18 1338  TROPONINI 0.07*   No results for input(s): TROPIPOC in the last 168 hours.  BNPNo results for  input(s): BNP, PROBNP in the last 168 hours.  DDimer No results for input(s): DDIMER in the last 168 hours.  Radiology/Studies:  Dg Chest Port 1 View  Result Date: 06/07/2018 CLINICAL DATA:  Possible sepsis EXAM: PORTABLE CHEST 1 VIEW COMPARISON:  01/21/2018 FINDINGS: Cardiac shadow is enlarged but again accentuated by the portable technique. The overall inspiratory effort is poor with crowding of the vascular markings. Some mild interstitial changes are again noted and stable likely of a chronic nature. Postsurgical changes are seen. IMPRESSION: Poor inspiratory effort with crowding vascular markings. No focal infiltrate is seen. Electronically Signed   By: Inez Catalina M.D.   On: 06/07/2018 14:13    Assessment and Plan:   1. CAD, s/p CABG 2010, no anginal symptoms since - on ASA - home norvasc, lasix 40 mg BID, and lipitor - troponin is mildly elevated 0.07 in the setting of CKD stage IV - patient denies anginal symptoms - EKG with signs of inferior infarct and poor R wave progression, which appear stable from prior - consider wandering pacemaker - irregular rhythm on exam and morphologically different p waves   2. HLD with LDL goal less than 70 - 01/09/2018: Cholesterol 126; HDL 34; LDL Cholesterol 74; Triglycerides 89; VLDL 18 - continue statin   3. DM - A1c 8.3%, which is improved from 10.4% - per primary team, on home insulin   4. CKD stage IV - sCr 2.50 - baseline creatinine appears to be 2.1-2.9 - may need nephrology consult   5. Chronic diastolic heart failure - weight today 179 lbs, question admission weight of 191 lbs - CXR without acute findings, but poor inspiratory effort - BNP not drawn - home dose of lasix: 40 mg BID - pt reports baseline orthopnea that isn't new for him - appears relatively euvolemic on exam   Preoperative evaluation for cardaic complications Pt is on insulin, CKD stage IV, and history of CAD/PAD. He does carry a diagnosis of chronic  diastolic heart failure, but LVEF has been normal since 2017. This is a high risk surgery in a patient with multiple co-morbidities. According to the RCRI, he has a 15% chance of MI, cardiac arrest or death. Utility of myoview in this patient may be limited in the absence of symptoms. Heart cath that may result in DAPT may complicate impending surgery. Will discuss with attending deferring ischemic workup at this time.   For questions or updates, please contact [place] Please consult [email or web address] for contact info under Cardiology/STEMI.   Signed, Tami Lin Duke, PA  06/08/2018 2:45 PM

## 2018-06-08 NOTE — Progress Notes (Signed)
ANTICOAGULATION CONSULT NOTE   Pharmacy Consult for heparin Indication: arterial occlusion  No Known Allergies  Patient Measurements: Height: 5\' 5"  (165.1 cm) Weight: 179 lb 10.8 oz (81.5 kg) IBW/kg (Calculated) : 61.5 Heparin Dosing Weight: 80kg  Vital Signs: Temp: 98.4 F (36.9 C) (06/25 0534) Temp Source: Oral (06/25 0534) BP: 121/58 (06/25 0534) Pulse Rate: 73 (06/25 0534)  Labs: Recent Labs    06/07/18 1257 06/07/18 1338 06/08/18 0049 06/08/18 1109  HGB 9.0*  --  7.7* 7.6*  HCT 28.8*  --  24.9* 25.0*  PLT 274  --  216 217  LABPROT 16.2*  --  16.7*  --   INR 1.32  --  1.37  --   HEPARINUNFRC  --   --  0.37 0.34  CREATININE 2.58*  --  2.50*  --   TROPONINI  --  0.07*  --   --    Estimated Creatinine Clearance: 22.8 mL/min (A) (by C-G formula based on SCr of 2.5 mg/dL (H)).  Medical History: Past Medical History:  Diagnosis Date  . Anemia   . Carotid artery disease ([place])   . CHF (congestive heart failure) ([place])   . CKD (chronic kidney disease), stage IV ([place])   . Coronary artery disease 2010   Status post coronary artery bypass grafting  . Diabetes mellitus   . Dysrhythmia   . Foot ulcer ([place])   . Hyperlipidemia   . Hypertension   . Hypothyroidism   . Myocardial infarction ([place]) 2010  . PAD (peripheral artery disease) ([place])   . Postoperative atrial fibrillation ([place])   . Thrombocytopenia ([facility])    Medications:  Infusions:  . sodium chloride 75 mL/hr at 06/07/18 1800  . heparin 1,300 Units/hr (06/08/18 0626)  . piperacillin-tazobactam (ZOSYN)  IV 3.375 g (06/08/18 0622)    Assessment: [name] presented to the ED with foot pain. To start IV heparin for arterial occlusion.  Hgb down slightly 9>7.6 likely dilutional as there are no overt signs or symtoms of bleeding. s low at 9 and platelets are WNL. He is not on anticoagulation PTA.S/p abdominal aortogram 6/20.   Heparin level 0.34, given low hemoglobin will leave heparin infusion at current  rate.  Goal of Therapy:  Heparin level 0.3-0.7 units/ml Monitor platelets by anticoagulation protocol: Yes   Plan:  Continue heparin gtt 1300 units/hr Daily heparin level and CBC Monitor for s/sx of bleeding  Katrine Radich L [place] Minteer 06/08/2018,12:32 PM

## 2018-06-08 NOTE — Progress Notes (Signed)
   Subjective: [name] was seen resting in his bed this morning. He states that his foot continue to be in pain, but he gets temporary relief from the IV pain medication. He was told we would order him longer acting oral pain medication as well. He knows that he will need to be seen by cardiology and orthopedics in addition to Vascular surgery to develop a plan of action for his left foot. He has no other questions or concerns this AM.  Objective:  Vital signs in last 24 hours: Vitals:   06/07/18 1800 06/07/18 1839 06/07/18 2153 06/08/18 0534  BP: 132/64 (!) 136/58 (!) 97/54 (!) 121/58  Pulse: 70 71 (!) 18 73  Resp: 18 15 (!) 22 18  Temp:  99.4 F (37.4 C) 98.3 F (36.8 C) 98.4 F (36.9 C)  TempSrc:  Oral Oral Oral  SpO2: 100% 99% 97% 96%  Weight:    179 lb 10.8 oz (81.5 kg)  Height:       Physical Exam  Constitutional: He is oriented to person, place, and time. He appears well-developed and well-nourished. No distress.  HENT:  Head: Normocephalic and atraumatic.  Eyes: EOM are normal. Right eye exhibits no discharge. Left eye exhibits no discharge.  Cardiovascular: Normal rate, regular rhythm, normal heart sounds and intact distal pulses.  Pulmonary/Chest: Effort normal and breath sounds normal. No respiratory distress.  Abdominal: Soft. Bowel sounds are normal. He exhibits no distension. There is no tenderness.  Musculoskeletal: He exhibits no edema.  Necrosis of 3rd 4th and 5th digits of LLE, 4th worse than other two  Neurological: He is alert and oriented to person, place, and time.  Skin: Skin is warm and dry.   Assessment/Plan:  Active Problems:   Atherosclerosis of native arteries of extremities with gangrene, left leg (HCC)  Cellulitis Atherosclerosis of native arteries of extremities with gangrene, left leg: Recent angiography in the setting of LLE ulcer showed occlusions. 2-3 days PTA he and family noticed significant worsening of the ulcers and worsening pain.  Wound necrotic on exam with foul odor. Surrounding skin erythematous and edematous consistent with surronding cellulitis. Vascular to coordinate with ortho for best plan of action for amputation (TMA vs BKA). Cardiology for clearance and optimization. - Vascular Surgery following, appreciate their recommendations - Orthopedics consulted, appreciate their recommendations - Cardiology consulted for pre-op clearance per Vascular surgery - Echocardiogram:  Pending - Heparin gtt - Oxycodone 5mg  q6h - Dilaudid 1mg  q3h PRN - Continue Zosyn - AM CBC and BMP  Anemia: Hgb stable at 9 on admission. > Hgb 7.7 this AM, no signs.symtoms of GI bleed, likely dilutional - Will continue to monitor and transfuse as need, ?may need transfusion prior to surgery  DM: On Lantus 40U BID and Novolog 5-15U TID at home - CBGs 200s this am - Lantus 15U BID - SSI-M  CAD (s/p CABG): Continue home ASA and Atrovastatin  CHF (EF 60-65%): Holding home lasix 40mg  Daily CKD III: Stable, Cr at baseline. A-fib: Sinus rhythm in ED. Not on any anticoagulation at home. HTN: Continue home amlodipine 5mg  Daily HLD: Continue home Atrovastatin Hypothyroidism: Continue home Synthroid 15mcg Daily  FEN: HH, CM VTE ppx: Heparin Code Status: FULL  Dispo: Anticipated discharge pending surgical workup.   Neva Seat, MD 06/08/2018, 10:06 AM Pager: [phone]

## 2018-06-09 ENCOUNTER — Inpatient Hospital Stay (HOSPITAL_COMMUNITY): Payer: Medicare HMO

## 2018-06-09 DIAGNOSIS — E1165 Type 2 diabetes mellitus with hyperglycemia: Secondary | ICD-10-CM

## 2018-06-09 DIAGNOSIS — I739 Peripheral vascular disease, unspecified: Secondary | ICD-10-CM

## 2018-06-09 DIAGNOSIS — I96 Gangrene, not elsewhere classified: Secondary | ICD-10-CM

## 2018-06-09 DIAGNOSIS — E1142 Type 2 diabetes mellitus with diabetic polyneuropathy: Secondary | ICD-10-CM

## 2018-06-09 DIAGNOSIS — IMO0002 Reserved for concepts with insufficient information to code with codable children: Secondary | ICD-10-CM

## 2018-06-09 DIAGNOSIS — I70262 Atherosclerosis of native arteries of extremities with gangrene, left leg: Secondary | ICD-10-CM

## 2018-06-09 DIAGNOSIS — N390 Urinary tract infection, site not specified: Secondary | ICD-10-CM

## 2018-06-09 DIAGNOSIS — E43 Unspecified severe protein-calorie malnutrition: Secondary | ICD-10-CM

## 2018-06-09 DIAGNOSIS — I509 Heart failure, unspecified: Secondary | ICD-10-CM

## 2018-06-09 DIAGNOSIS — B9620 Unspecified Escherichia coli [E. coli] as the cause of diseases classified elsewhere: Secondary | ICD-10-CM

## 2018-06-09 LAB — BASIC METABOLIC PANEL
ANION GAP: 10 (ref 5–15)
BUN: 35 mg/dL — AB (ref 8–23)
CHLORIDE: 102 mmol/L (ref 98–111)
CO2: 24 mmol/L (ref 22–32)
Calcium: 8.3 mg/dL — ABNORMAL LOW (ref 8.9–10.3)
Creatinine, Ser: 2.36 mg/dL — ABNORMAL HIGH (ref 0.61–1.24)
GFR calc Af Amer: 28 mL/min — ABNORMAL LOW (ref >60)
GFR calc non Af Amer: 24 mL/min — ABNORMAL LOW (ref >60)
GLUCOSE: 223 mg/dL — AB (ref 70–99)
POTASSIUM: 3.6 mmol/L (ref 3.5–5.1)
Sodium: 136 mmol/L (ref 135–145)

## 2018-06-09 LAB — CBC
HEMATOCRIT: 26 % — AB (ref 39.0–52.0)
HEMOGLOBIN: 7.9 g/dL — AB (ref 13.0–17.0)
MCH: 28.8 pg (ref 26.0–34.0)
MCHC: 30.4 g/dL (ref 30.0–36.0)
MCV: 94.9 fL (ref 78.0–100.0)
Platelets: 234 10*3/uL (ref 150–400)
RBC: 2.74 MIL/uL — AB (ref 4.22–5.81)
RDW: 13.4 % (ref 11.5–15.5)
WBC: 9.8 10*3/uL (ref 4.0–10.5)

## 2018-06-09 LAB — GLUCOSE, CAPILLARY
GLUCOSE-CAPILLARY: 192 mg/dL — AB (ref 70–99)
Glucose-Capillary: 197 mg/dL — ABNORMAL HIGH (ref 70–99)
Glucose-Capillary: 155 mg/dL — ABNORMAL HIGH (ref 70–99)

## 2018-06-09 LAB — HEPARIN LEVEL (UNFRACTIONATED): Heparin Unfractionated: 0.31 IU/mL (ref 0.30–0.70)

## 2018-06-09 MED ORDER — FUROSEMIDE 40 MG PO TABS
40.0000 mg | ORAL_TABLET | Freq: Two times a day (BID) | ORAL | Status: DC
  Administered 2018-06-09 – 2018-06-13 (×7): 40 mg via ORAL
  Filled 2018-06-09 (×7): qty 1

## 2018-06-09 MED ORDER — INSULIN GLARGINE 100 UNIT/ML ~~LOC~~ SOLN
25.0000 [IU] | Freq: Two times a day (BID) | SUBCUTANEOUS | Status: DC
  Filled 2018-06-09: qty 0.25

## 2018-06-09 MED ORDER — INSULIN GLARGINE 100 UNIT/ML ~~LOC~~ SOLN
20.0000 [IU] | Freq: Two times a day (BID) | SUBCUTANEOUS | Status: DC
  Administered 2018-06-09 – 2018-06-11 (×4): 20 [IU] via SUBCUTANEOUS
  Filled 2018-06-09 (×4): qty 0.2

## 2018-06-09 MED ORDER — OXYCODONE HCL 5 MG PO TABS
5.0000 mg | ORAL_TABLET | ORAL | Status: DC
  Administered 2018-06-09 – 2018-06-13 (×22): 5 mg via ORAL
  Filled 2018-06-09 (×23): qty 1

## 2018-06-09 NOTE — Progress Notes (Signed)
   Daily Progress Note  I discussed earlier today: L CFA to BK pop BPG with Propaten.  Pt is risk stratified high risk from cardiac viewpoint.  The patient is aware he is at high risk of mortality.  I estimated 5-10% given his multiple co-morbidities.   The risk, benefits, and alternative for bypass operations were discussed with the patient.    The patient is aware the risks include but are not limited to: bleeding, infection, myocardial infarction, stroke, limb loss, nerve damage, limb edema, need for additional procedures in the future, wound complications, and inability to complete the bypass.   I discussed with the patient that he has significant tibial artery disease with only peroneal artery runoff.  He also has significant digital artery disease in the foot that may prevent salvage of his foot.  The patient is aware of these risks and agreed to proceed.  He is scheduled for tomorrow.   Adele Barthel, MD, FACS [facility] Office: [phone] Pager: [phone]  06/09/2018, 9:38 PM

## 2018-06-09 NOTE — Progress Notes (Signed)
Internal Medicine Attending:   I saw and examined the patient. I reviewed the resident's note and I agree with the resident's findings and plan as documented in the resident's note.  Patient still complains of persistent pain in his left foot and and that the foot is looking worse.  Patient is initially admitted for gangrene of his third fourth and fifth toes of his left lower extremity with surrounding cellulitis.  Orthopedics and vascular surgery follow-up and recommendations appreciated.  Patient will need revascularization procedure and if he achieves sufficient blood flow Ortho will perform a transmetatarsal amputation and attempt to save the limb.  If revascularization is not possible then patient will require a BKA or AKA.  We will continue with pain control for now.  Urology follow-up recommendations appreciated.  Patient is high risk for surgery.  Continue with pain control for now.  We will continue with IV antibiotics with Zosyn.  Continue with heparin drip for now.  Of note, patient had a decrease in his hemoglobin to the sevens yesterday.  This is remained stable for now.  We will continue to monitor him closely and transfuse as needed.  Patient also noted to have a UTI with UA showing large leukocytes and many bacteria and urine culture showing greater than 100,000 colonies of E. coli.  Patient is already on Zosyn for his left lower extremity infection.  This should provide adequate coverage for his UTI as well.

## 2018-06-09 NOTE — Progress Notes (Addendum)
Subjective: Mr Bihl was seen resting in his bed. He states his pain is about the same as yesterday, but is more consistently staying at a 6/10 with the addition of oral pain medications. He was seen by cardiology yesterday and orthopedics today. The plan is to undergo revascularization with vascular surgery, then pursue TMA if revascularization is sufficient. This was explained to the patient. He has no other questions or concerns this AM.  Objective:  Vital signs in last 24 hours: Vitals:   06/08/18 1619 06/08/18 2128 06/09/18 0544 06/09/18 0655  BP: (!) 138/57 116/64 (!) 141/73   Pulse: 61 72 (!) 55   Resp: 18 20 20 16   Temp: 98.6 F (37 C) 98.4 F (36.9 C) 98.7 F (37.1 C)   TempSrc: Oral Oral Oral   SpO2: 95% 95% 95%   Weight:    182 lb 15.7 oz (83 kg)  Height:       Physical Exam  Constitutional: He is oriented to person, place, and time. He appears well-developed and well-nourished. No distress.  HENT:  Head: Normocephalic and atraumatic.  Eyes: EOM are normal. Right eye exhibits no discharge. Left eye exhibits no discharge.  Cardiovascular: Normal rate, regular rhythm, normal heart sounds and intact distal pulses.  Pulmonary/Chest: Effort normal and breath sounds normal. No respiratory distress.  Abdominal: Soft. Bowel sounds are normal. He exhibits no distension. There is no tenderness.  Musculoskeletal: He exhibits no edema.  Necrosis of 3rd 4th and 5th digits of LLE, 4th worse than other two  Neurological: He is alert and oriented to person, place, and time.  Skin: Skin is warm and dry.   Assessment/Plan:  Active Problems:   Atherosclerosis of native arteries of extremities with gangrene, left leg (HCC)   Necrotic toes (HCC)   PAD (peripheral artery disease) (HCC)   Preoperative cardiovascular examination   S/P CABG (coronary artery bypass graft)  Cellulitis Atherosclerosis of native arteries of extremities with gangrene, left leg: Recent angiography in the  setting of LLE ulcer showed occlusions. 2-3 days PTA he and family noticed significant worsening of the ulcers and worsening pain. Wound necrotic on exam with foul odor. Surrounding skin erythematous and edematous consistent with surronding cellulitis. Vascular to revascularized extremity and ortho will perform TMA if adequate blood flow achieved. > Cardiology has classified patient as [place] [facility]; Orthopedics; and Cardiology recommendations - Echocardiogram:  EF 55-60%; G2DD - Oxycodone 5mg , increased frequency to q4h - Dilaudid 1mg  q3h PRN - Continue Zosyn - Heparin gtt - AM CBC and BMP  Anemia: Hgb stable at 9 on admission. > Hgb stable at 7.9 this AM. There have been no signs/symtoms of GI bleed, likely dilutional. - Will continue to monitor and transfuse as needed, ?may need transfusion prior to surgery  UTI: UA with large Leukocytes and many bacteria. > Ur Culture pending - Patient covered with zosyn for cellulitis as above  DM: On Lantus 40U BID and Novolog 5-15U TID at home - CBGs 200s this am - Lantus 25U BID - SSI-M  CAD (s/p CABG): Continue home ASA and Atrovastatin - Started on Metoprolol 12.5 BID for perioperative risk reduction   - Daily ASA  CHF (EF 60-65%): > Weight up 3lbs from yesterday - Restart home lasix 40mg  Daily - Started on Metoprolol 12.5 BID for perioperative risk reduction   HTN: On amlodipine 5mg  Daily at home. - Reduced to 2.5mg  Daily as patient starting on BB  CKD III: Stable, Cr at  baseline. A-fib: Sinus rhythm in ED. Not on any anticoagulation at home. HLD: Continue home Atrovastatin Hypothyroidism: Continue home Synthroid 72mcg Daily  FEN: HH, CM VTE ppx: Heparin Code Status: FULL  Dispo: Anticipated discharge pending surgical workup.   Neva Seat, MD 06/09/2018, 8:18 AM Pager: [phone]

## 2018-06-09 NOTE — Progress Notes (Signed)
ANTICOAGULATION CONSULT NOTE   Pharmacy Consult for heparin Indication: arterial occlusion  No Known Allergies  Patient Measurements: Height: 5\' 5"  (165.1 cm) Weight: 182 lb 15.7 oz (83 kg) IBW/kg (Calculated) : 61.5 Heparin Dosing Weight: 80kg  Vital Signs: Temp: 98.7 F (37.1 C) (06/26 0544) Temp Source: Oral (06/26 0544) BP: 141/73 (06/26 0544) Pulse Rate: 55 (06/26 0544)  Labs: Recent Labs    06/07/18 1257 06/07/18 1338 06/08/18 0049 06/08/18 1109 06/09/18 0502  HGB 9.0*  --  7.7* 7.6* 7.9*  HCT 28.8*  --  24.9* 25.0* 26.0*  PLT 274  --  216 217 234  LABPROT 16.2*  --  16.7*  --   --   INR 1.32  --  1.37  --   --   HEPARINUNFRC  --   --  0.37 0.34 0.31  CREATININE 2.58*  --  2.50*  --  2.36*  TROPONINI  --  0.07*  --   --   --    Estimated Creatinine Clearance: 24.3 mL/min (A) (by C-G formula based on SCr of 2.36 mg/dL (H)).  Medical History: Past Medical History:  Diagnosis Date  . Anemia   . Carotid artery disease ([place])   . CHF (congestive heart failure) ([place])   . CKD (chronic kidney disease), stage IV ([place])   . Coronary artery disease 2010   Status post coronary artery bypass grafting  . Diabetes mellitus   . Dysrhythmia   . Foot ulcer ([place])   . Hyperlipidemia   . Hypertension   . Hypothyroidism   . Myocardial infarction ([place]) 2010  . PAD (peripheral artery disease) ([place])   . Postoperative atrial fibrillation ([place])   . Thrombocytopenia (HCC)    Medications:  Infusions:  . sodium chloride 75 mL/hr at 06/08/18 1457  . heparin 1,300 Units/hr (06/09/18 0220)  . piperacillin-tazobactam (ZOSYN)  IV 3.375 g (06/09/18 7169)    Assessment: 9 yom started on IV heparin for arterial occlusion.  Hgb remains low but stable 7.9 and platelets are WNL.; no overt signs or symtoms of bleeding documented.  He is not on anticoagulation PTA.S/p abdominal aortogram 6/20.   Heparin level 0.31, has trended down to the low end of therapeutic goal. Will increase rate  slightly to prevent level from becoming subtherapeutic.   Goal of Therapy:  Heparin level 0.3-0.7 units/ml Monitor platelets by anticoagulation protocol: Yes   Plan:  Increase heparin gtt to 1350 units/hr Daily heparin level and CBC Monitor for s/sx of bleeding  Nicole Kindred L Jin Capote 06/09/2018,8:07 AM

## 2018-06-09 NOTE — Progress Notes (Signed)
   Daily Progress Note  Cardiology risk stratifies this patient high risk.  Awaiting Dr. Jess Barters evaluation today.  I spoke with him yesterday.  Depending on whether Dr. Sharol Given feels a TMA is salvageable, I will determine this patient's revascularization options.   TMA possible: L CFA to BK pop BPG w/ Propaten with to minimal time in the OR  TMA not possible: L BKA vs AKA.  Adele Barthel, MD, FACS [facility] Office: [phone] Pager: [phone]  06/09/2018, 7:54 AM

## 2018-06-09 NOTE — Progress Notes (Signed)
Nutrition Consult/Brief Note  RD consulted for protein calorie malnutrition. Initial nutrition assessment completed 06/08/18. Pt is receiving Glucerna Shakes & Juven supplements. RD will continue to follow for nutrition care plan.  Arthur Holms, RD, LDN Pager #: [phone] After-Hours Pager #: [phone]

## 2018-06-09 NOTE — Consult Note (Signed)
ORTHOPAEDIC CONSULTATION  REQUESTING PHYSICIAN: Aldine Contes, MD  Chief Complaint: Painful progressive gangrene left foot.  HPI: [name] is a 83 y.o. male who presents with diabetic insensate neuropathy with poorly controlled diabetes with severe protein caloric malnutrition with progressively painful and progressive increasing gangrene to the left forefoot.  Past Medical History:  Diagnosis Date  . Anemia   . Carotid artery disease ([place])   . CHF (congestive heart failure) ([place])   . CKD (chronic kidney disease), stage IV ([place])   . Coronary artery disease 2010   Status post coronary artery bypass grafting  . Diabetes mellitus   . Dysrhythmia   . Foot ulcer ([place])   . Hyperlipidemia   . Hypertension   . Hypothyroidism   . Myocardial infarction ([place]) 2010  . PAD (peripheral artery disease) ([place])   . Postoperative atrial fibrillation ([place])   . Thrombocytopenia ([place])    Past Surgical History:  Procedure Laterality Date  . AORTOGRAM Left 06/03/2018   Procedure: ABDOMINAL AORTOGRAM WITH CARBON DIOXIDE LEFT LOWER EXTREMITY RUNOFF;  Surgeon: Conrad [place], MD;  Location: [facility];  Service: Vascular;  Laterality: Left;  . carotid insuff    . CORONARY ARTERY BYPASS GRAFT  2010   LIMA to LAD, SVG to diagonal, SVG to circumflex, marginal, SVG to posterior descending branch.   Social History   Socioeconomic History  . Marital status: Widowed    Spouse name: Not on file  . Number of children: Not on file  . Years of education: Not on file  . Highest education level: Not on file  Occupational History  . Not on file  Social Needs  . Financial resource strain: Not on file  . Food insecurity:    Worry: Not on file    Inability: Not on file  . Transportation needs:    Medical: Not on file    Non-medical: Not on file  Tobacco Use  . Smoking status: Never Smoker  . Smokeless tobacco: Never Used  Substance and Sexual Activity  . Alcohol use: Not Currently   Alcohol/week: 0.0 oz  . Drug use: No  . Sexual activity: Not on file  Lifestyle  . Physical activity:    Days per week: Not on file    Minutes per session: Not on file  . Stress: Not on file  Relationships  . Social connections:    Talks on phone: Not on file    Gets together: Not on file    Attends religious service: Not on file    Active member of club or organization: Not on file    Attends meetings of clubs or organizations: Not on file    Relationship status: Not on file  Other Topics Concern  . Not on file  Social History Narrative  . Not on file   Family History  Problem Relation Age of Onset  . Heart attack Father   . Heart attack Brother    - negative except otherwise stated in the family history section No Known Allergies Prior to Admission medications   Medication Sig Start Date End Date Taking? Authorizing Provider  amLODipine (NORVASC) 5 MG tablet Take 5 mg by mouth daily.   Yes [provider]  aspirin 81 MG tablet Take 81 mg by mouth at bedtime.    Yes [provider]  atorvastatin (LIPITOR) 40 MG tablet Take 40 mg by mouth at bedtime.  01/30/16  Yes [provider]  furosemide (LASIX) 40 MG tablet Take  1 tablet (40 mg total) by mouth 2 (two) times daily. 01/10/18  Yes Patrecia Pour, Christean Grief, MD  insulin aspart (NOVOLOG) 100 UNIT/ML injection Inject 5-15 Units into the skin 3 (three) times daily as needed for high blood sugar (CBG >200).   Yes [provider]  insulin glargine (LANTUS) 100 UNIT/ML injection Inject 40 Units into the skin 2 (two) times daily.   Yes [provider]  levothyroxine (SYNTHROID, LEVOTHROID) 50 MCG tablet Take 50 mcg by mouth daily.   Yes [provider]   Dg Chest Port 1 View  Result Date: 06/07/2018 CLINICAL DATA:  Possible sepsis EXAM: PORTABLE CHEST 1 VIEW COMPARISON:  01/21/2018 FINDINGS: Cardiac shadow is enlarged but again accentuated by the portable technique. The overall  inspiratory effort is poor with crowding of the vascular markings. Some mild interstitial changes are again noted and stable likely of a chronic nature. Postsurgical changes are seen. IMPRESSION: Poor inspiratory effort with crowding vascular markings. No focal infiltrate is seen. Electronically Signed   By: Inez Catalina M.D.   On: 06/07/2018 14:13   - pertinent xrays, CT, MRI studies were reviewed and independently interpreted  Positive ROS: All other systems have been reviewed and were otherwise negative with the exception of those mentioned in the HPI and as above.  Physical Exam: General: Alert, no acute distress Psychiatric: Patient is competent for consent with normal mood and affect Lymphatic: No axillary or cervical lymphadenopathy Cardiovascular: No pedal edema Respiratory: No cyanosis, no use of accessory musculature GI: No organomegaly, abdomen is soft and non-tender    Images:  @ENCIMAGES @  Labs:  Lab Results  Component Value Date   HGBA1C 8.3 (H) 05/31/2018   HGBA1C 10.4 (H) 12/23/2017   HGBA1C 9.6 03/26/2016   ESRSEDRATE 82 (H) 12/23/2017   CRP 17.9 (H) 12/22/2017   REPTSTATUS PENDING 06/07/2018   CULT  06/07/2018    NO GROWTH 2 DAYS Performed at [facility], [address], [place], [place] [address]    LABORGA ESCHERICHIA COLI 02/22/2016    Lab Results  Component Value Date   ALBUMIN 1.9 (L) 06/08/2018   ALBUMIN 2.5 (L) 06/07/2018   ALBUMIN 3.6 01/09/2018    Neurologic: Patient does not have protective sensation bilateral lower extremities.   MUSCULOSKELETAL:   Skin: On examination patient has dry gangrene over the lateral forefoot.  This does not extend into the forefoot.  Patient does have decreased turgor of the heel pad which is tender to palpation.  Patient does not have a palpable dorsalis pedis pulse does not have a popliteal pulse.  There is no ascending cellulitis.  Patient has poorly controlled diabetes with hemoglobin A1c ranging from  10-8.3.  He has severe protein caloric malnutrition with an albumin of 1.9.  Assessment: Assessment: Diabetic insensate neuropathy with severe peripheral vascular disease with severe protein caloric malnutrition with gangrene of the left forefoot.  Plan: Plan: Feel that patient could heal a transmetatarsal amputation if he has sufficient microcirculation with the revascularization procedure.  Discussed that his healing is based on nutrition status, diabetes control, and microcirculatory viability.  Discussed that if he does undergo revascularization procedure with vascular surgery he would be a candidate for attempted transmetatarsal amputation.  Thank you for the consult and the opportunity to see Mr. Aiven Kampe, [place] [phone] 10:01 AM

## 2018-06-09 NOTE — Progress Notes (Signed)
Bilateral lower extremity vein mapping has been completed.     06/09/18 3:41 PM [name] RVT

## 2018-06-10 ENCOUNTER — Inpatient Hospital Stay (HOSPITAL_COMMUNITY): Payer: Medicare HMO | Admitting: Certified Registered"

## 2018-06-10 ENCOUNTER — Encounter (HOSPITAL_COMMUNITY)
Admission: EM | Disposition: A | Discharge status: Skilled Nursing Facility | Payer: Self-pay | Source: Home / Self Care | Attending: Internal Medicine

## 2018-06-10 ENCOUNTER — Encounter (HOSPITAL_COMMUNITY): Payer: Self-pay | Admitting: *Deleted

## 2018-06-10 DIAGNOSIS — I96 Gangrene, not elsewhere classified: Secondary | ICD-10-CM

## 2018-06-10 DIAGNOSIS — I25118 Atherosclerotic heart disease of native coronary artery with other forms of angina pectoris: Secondary | ICD-10-CM

## 2018-06-10 DIAGNOSIS — R011 Cardiac murmur, unspecified: Secondary | ICD-10-CM

## 2018-06-10 DIAGNOSIS — E876 Hypokalemia: Secondary | ICD-10-CM

## 2018-06-10 HISTORY — PX: FEMORAL-POPLITEAL BYPASS GRAFT: SHX937

## 2018-06-10 LAB — URINE CULTURE: Culture: >=100000 — AB

## 2018-06-10 LAB — BASIC METABOLIC PANEL
Anion gap: 8 (ref 5–15)
BUN: 30 mg/dL — ABNORMAL HIGH (ref 8–23)
CALCIUM: 8.2 mg/dL — AB (ref 8.9–10.3)
CO2: 25 mmol/L (ref 22–32)
Chloride: 103 mmol/L (ref 98–111)
Creatinine, Ser: 2.1 mg/dL — ABNORMAL HIGH (ref 0.61–1.24)
GFR calc Af Amer: 32 mL/min — ABNORMAL LOW (ref >60)
GFR, EST NON AFRICAN AMERICAN: 28 mL/min — AB (ref >60)
GLUCOSE: 165 mg/dL — AB (ref 70–99)
Potassium: 3.3 mmol/L — ABNORMAL LOW (ref 3.5–5.1)
Sodium: 136 mmol/L (ref 135–145)

## 2018-06-10 LAB — CBC
HCT: 26.6 % — ABNORMAL LOW (ref 39.0–52.0)
HEMATOCRIT: 35.9 % — AB (ref 39.0–52.0)
Hemoglobin: 8.1 g/dL — ABNORMAL LOW (ref 13.0–17.0)
Hemoglobin: 11.3 g/dL — ABNORMAL LOW (ref 13.0–17.0)
MCH: 28.6 pg (ref 26.0–34.0)
MCH: 28.9 pg (ref 26.0–34.0)
MCHC: 30.5 g/dL (ref 30.0–36.0)
MCHC: 31.5 g/dL (ref 30.0–36.0)
MCV: 94 fL (ref 78.0–100.0)
MCV: 91.8 fL (ref 78.0–100.0)
PLATELETS: 232 10*3/uL (ref 150–400)
Platelets: 219 10*3/uL (ref 150–400)
RBC: 2.83 MIL/uL — ABNORMAL LOW (ref 4.22–5.81)
RBC: 3.91 MIL/uL — ABNORMAL LOW (ref 4.22–5.81)
RDW: 13.3 % (ref 11.5–15.5)
RDW: 14.2 % (ref 11.5–15.5)
WBC: 8.9 10*3/uL (ref 4.0–10.5)
WBC: 14 10*3/uL — ABNORMAL HIGH (ref 4.0–10.5)

## 2018-06-10 LAB — GLUCOSE, CAPILLARY
GLUCOSE-CAPILLARY: 146 mg/dL — AB (ref 70–99)
GLUCOSE-CAPILLARY: 161 mg/dL — AB (ref 70–99)
GLUCOSE-CAPILLARY: 211 mg/dL — AB (ref 70–99)
GLUCOSE-CAPILLARY: 206 mg/dL — AB (ref 70–99)
Glucose-Capillary: 216 mg/dL — ABNORMAL HIGH (ref 70–99)
Glucose-Capillary: 174 mg/dL — ABNORMAL HIGH (ref 70–99)

## 2018-06-10 LAB — PREPARE RBC (CROSSMATCH)

## 2018-06-10 LAB — TROPONIN I: Troponin I: 0.03 ng/mL (ref <0.03)

## 2018-06-10 LAB — HEPARIN LEVEL (UNFRACTIONATED): HEPARIN UNFRACTIONATED: 0.35 [IU]/mL (ref 0.30–0.70)

## 2018-06-10 SURGERY — BYPASS GRAFT FEMORAL-POPLITEAL ARTERY
Anesthesia: General | Site: Leg Lower | Laterality: Left

## 2018-06-10 MED ORDER — SUGAMMADEX SODIUM 200 MG/2ML IV SOLN
INTRAVENOUS | Status: AC
  Filled 2018-06-10: qty 2

## 2018-06-10 MED ORDER — HYDROMORPHONE HCL 1 MG/ML IJ SOLN
INTRAMUSCULAR | Status: AC
  Filled 2018-06-10: qty 1

## 2018-06-10 MED ORDER — ROCURONIUM BROMIDE 50 MG/5ML IV SOLN
INTRAVENOUS | Status: AC
  Filled 2018-06-10: qty 1

## 2018-06-10 MED ORDER — HEPARIN SODIUM (PORCINE) 5000 UNIT/ML IJ SOLN
5000.0000 [IU] | Freq: Three times a day (TID) | INTRAMUSCULAR | Status: DC
  Administered 2018-06-11 – 2018-06-18 (×23): 5000 [IU] via SUBCUTANEOUS
  Filled 2018-06-10 (×23): qty 1

## 2018-06-10 MED ORDER — EPHEDRINE SULFATE 50 MG/ML IJ SOLN
INTRAMUSCULAR | Status: AC
  Filled 2018-06-10: qty 1

## 2018-06-10 MED ORDER — HEPARIN SODIUM (PORCINE) 1000 UNIT/ML IJ SOLN
INTRAMUSCULAR | Status: AC
  Filled 2018-06-10: qty 1

## 2018-06-10 MED ORDER — EPHEDRINE SULFATE-NACL 50-0.9 MG/10ML-% IV SOSY
PREFILLED_SYRINGE | INTRAVENOUS | Status: DC | PRN
  Administered 2018-06-10: 10 mg via INTRAVENOUS

## 2018-06-10 MED ORDER — POTASSIUM CHLORIDE CRYS ER 20 MEQ PO TBCR
40.0000 meq | EXTENDED_RELEASE_TABLET | Freq: Once | ORAL | Status: AC
Start: 2018-06-10 — End: 2018-06-10
  Administered 2018-06-10: 40 meq via ORAL
  Filled 2018-06-10: qty 2

## 2018-06-10 MED ORDER — SUGAMMADEX SODIUM 200 MG/2ML IV SOLN
INTRAVENOUS | Status: DC | PRN
  Administered 2018-06-10: 200 mg via INTRAVENOUS

## 2018-06-10 MED ORDER — PIPERACILLIN-TAZOBACTAM 3.375 G IVPB 30 MIN
3.3750 g | INTRAVENOUS | Status: DC
  Filled 2018-06-10: qty 50

## 2018-06-10 MED ORDER — HYDRALAZINE HCL 20 MG/ML IJ SOLN
10.0000 mg | Freq: Once | INTRAMUSCULAR | Status: AC
  Administered 2018-06-10: 10 mg via INTRAVENOUS
  Filled 2018-06-10: qty 1

## 2018-06-10 MED ORDER — GI COCKTAIL ~~LOC~~
30.0000 mL | Freq: Every day | ORAL | Status: DC | PRN
  Administered 2018-06-10: 30 mL via ORAL

## 2018-06-10 MED ORDER — PHENYLEPHRINE HCL 10 MG/ML IJ SOLN
INTRAVENOUS | Status: DC | PRN
  Administered 2018-06-10: 20 ug/min via INTRAVENOUS

## 2018-06-10 MED ORDER — PROTAMINE SULFATE 10 MG/ML IV SOLN
INTRAVENOUS | Status: DC | PRN
  Administered 2018-06-10: 50 mg via INTRAVENOUS

## 2018-06-10 MED ORDER — FENTANYL CITRATE (PF) 100 MCG/2ML IJ SOLN
INTRAMUSCULAR | Status: DC | PRN
  Administered 2018-06-10 (×4): 50 ug via INTRAVENOUS

## 2018-06-10 MED ORDER — LIDOCAINE 2% (20 MG/ML) 5 ML SYRINGE
INTRAMUSCULAR | Status: AC
  Filled 2018-06-10: qty 5

## 2018-06-10 MED ORDER — HEMOSTATIC AGENTS (NO CHARGE) OPTIME
TOPICAL | Status: DC | PRN
  Administered 2018-06-10: 1 via TOPICAL

## 2018-06-10 MED ORDER — HEPARIN SODIUM (PORCINE) 1000 UNIT/ML IJ SOLN
INTRAMUSCULAR | Status: DC | PRN
  Administered 2018-06-10: 8500 [IU] via INTRAVENOUS

## 2018-06-10 MED ORDER — ROCURONIUM BROMIDE 10 MG/ML (PF) SYRINGE
PREFILLED_SYRINGE | INTRAVENOUS | Status: DC | PRN
  Administered 2018-06-10: 20 mg via INTRAVENOUS
  Administered 2018-06-10: 50 mg via INTRAVENOUS
  Administered 2018-06-10: 20 mg via INTRAVENOUS

## 2018-06-10 MED ORDER — ALUM & MAG HYDROXIDE-SIMETH 200-200-20 MG/5ML PO SUSP
ORAL | Status: AC
  Filled 2018-06-10: qty 30

## 2018-06-10 MED ORDER — SODIUM CHLORIDE 0.9 % IV SOLN
INTRAVENOUS | Status: DC | PRN
  Administered 2018-06-10: 14:00:00

## 2018-06-10 MED ORDER — SODIUM CHLORIDE 0.9% IV SOLUTION
Freq: Once | INTRAVENOUS | Status: AC
  Administered 2018-06-16: 10 mL/h via INTRAVENOUS

## 2018-06-10 MED ORDER — SODIUM CHLORIDE 0.9 % IV SOLN
INTRAVENOUS | Status: DC
  Administered 2018-06-10 – 2018-06-12 (×2): via INTRAVENOUS

## 2018-06-10 MED ORDER — DEXAMETHASONE SODIUM PHOSPHATE 10 MG/ML IJ SOLN
INTRAMUSCULAR | Status: DC | PRN
  Administered 2018-06-10: 5 mg via INTRAVENOUS

## 2018-06-10 MED ORDER — HYDROMORPHONE HCL 1 MG/ML IJ SOLN
0.2500 mg | INTRAMUSCULAR | Status: DC | PRN
  Administered 2018-06-10 (×2): 0.5 mg via INTRAVENOUS
  Administered 2018-06-10: 0.25 mg via INTRAVENOUS
  Administered 2018-06-10: 0.5 mg via INTRAVENOUS
  Administered 2018-06-10: 0.25 mg via INTRAVENOUS

## 2018-06-10 MED ORDER — ONDANSETRON HCL 4 MG/2ML IJ SOLN
INTRAMUSCULAR | Status: AC
  Filled 2018-06-10: qty 2

## 2018-06-10 MED ORDER — PANTOPRAZOLE SODIUM 40 MG PO TBEC
40.0000 mg | DELAYED_RELEASE_TABLET | Freq: Every day | ORAL | Status: DC
  Administered 2018-06-10 – 2018-06-18 (×8): 40 mg via ORAL
  Filled 2018-06-10 (×9): qty 1

## 2018-06-10 MED ORDER — PHENOL 1.4 % MT LIQD: 1.0000 | OROMUCOSAL | Status: DC | PRN

## 2018-06-10 MED ORDER — SODIUM CHLORIDE 0.9 % IV SOLN
INTRAVENOUS | Status: AC
  Filled 2018-06-10: qty 1.2

## 2018-06-10 MED ORDER — PROPOFOL 10 MG/ML IV BOLUS
INTRAVENOUS | Status: DC | PRN
  Administered 2018-06-10: 90 mg via INTRAVENOUS

## 2018-06-10 MED ORDER — HYDROMORPHONE HCL 1 MG/ML IJ SOLN
0.5000 mg | INTRAMUSCULAR | Status: AC | PRN
  Administered 2018-06-10 (×2): 0.5 mg via INTRAVENOUS

## 2018-06-10 MED ORDER — ONDANSETRON HCL 4 MG/2ML IJ SOLN
INTRAMUSCULAR | Status: DC | PRN
  Administered 2018-06-10: 4 mg via INTRAVENOUS

## 2018-06-10 MED ORDER — LIDOCAINE 2% (20 MG/ML) 5 ML SYRINGE
INTRAMUSCULAR | Status: DC | PRN
  Administered 2018-06-10: 60 mg via INTRAVENOUS

## 2018-06-10 MED ORDER — FENTANYL CITRATE (PF) 250 MCG/5ML IJ SOLN
INTRAMUSCULAR | Status: AC
  Filled 2018-06-10: qty 5

## 2018-06-10 MED ORDER — 0.9 % SODIUM CHLORIDE (POUR BTL) OPTIME
TOPICAL | Status: DC | PRN
  Administered 2018-06-10: 1000 mL

## 2018-06-10 MED ORDER — PROPOFOL 10 MG/ML IV BOLUS
INTRAVENOUS | Status: AC
  Filled 2018-06-10: qty 20

## 2018-06-10 SURGICAL SUPPLY — 67 items
ADH SKN CLS APL DERMABOND .7 (GAUZE/BANDAGES/DRESSINGS) ×2
AGENT HMST SPONGE THK3/8 (HEMOSTASIS) ×1
BANDAGE ESMARK 6X9 LF (GAUZE/BANDAGES/DRESSINGS) IMPLANT
BNDG CMPR 9X6 STRL LF SNTH (GAUZE/BANDAGES/DRESSINGS)
BNDG ESMARK 6X9 LF (GAUZE/BANDAGES/DRESSINGS)
CANISTER SUCT 3000ML PPV (MISCELLANEOUS) ×3 IMPLANT
CLIP FOGARTY SPRING 6M (CLIP) ×3 IMPLANT
CLIP VESOCCLUDE MED 24/CT (CLIP) ×3 IMPLANT
CLIP VESOCCLUDE SM WIDE 24/CT (CLIP) ×3 IMPLANT
COVER PROBE W GEL 5X96 (DRAPES) ×3 IMPLANT
CUFF TOURNIQUET SINGLE 24IN (TOURNIQUET CUFF) IMPLANT
CUFF TOURNIQUET SINGLE 34IN LL (TOURNIQUET CUFF) IMPLANT
CUFF TOURNIQUET SINGLE 44IN (TOURNIQUET CUFF) IMPLANT
DERMABOND ADVANCED (GAUZE/BANDAGES/DRESSINGS) ×4
DERMABOND ADVANCED .7 DNX12 (GAUZE/BANDAGES/DRESSINGS) ×1 IMPLANT
DRAIN CHANNEL 15F RND FF W/TCR (WOUND CARE) IMPLANT
DRAPE C-ARM 42X72 X-RAY (DRAPES) ×3 IMPLANT
DRAPE HALF SHEET 40X57 (DRAPES) IMPLANT
ELECT REM PT RETURN 9FT ADLT (ELECTROSURGICAL) ×3
ELECTRODE REM PT RTRN 9FT ADLT (ELECTROSURGICAL) ×1 IMPLANT
EVACUATOR SILICONE 100CC (DRAIN) IMPLANT
GLOVE BIO SURGEON STRL SZ 6.5 (GLOVE) ×3 IMPLANT
GLOVE BIO SURGEON STRL SZ7 (GLOVE) ×3 IMPLANT
GLOVE BIO SURGEON STRL SZ7.5 (GLOVE) ×2 IMPLANT
GLOVE BIO SURGEONS STRL SZ 6.5 (GLOVE) ×3
GLOVE BIOGEL PI IND STRL 6.5 (GLOVE) IMPLANT
GLOVE BIOGEL PI IND STRL 7.0 (GLOVE) IMPLANT
GLOVE BIOGEL PI IND STRL 7.5 (GLOVE) ×1 IMPLANT
GLOVE BIOGEL PI INDICATOR 6.5 (GLOVE) ×8
GLOVE BIOGEL PI INDICATOR 7.0 (GLOVE) ×2
GLOVE BIOGEL PI INDICATOR 7.5 (GLOVE) ×4
GLOVE SURG SS PI 6.0 STRL IVOR (GLOVE) ×2 IMPLANT
GOWN STRL REUS W/ TWL LRG LVL3 (GOWN DISPOSABLE) ×3 IMPLANT
GOWN STRL REUS W/TWL LRG LVL3 (GOWN DISPOSABLE) ×9
GRAFT PROPATEN THIN WALL 6X80 (Vascular Products) ×2 IMPLANT
HEMOSTAT SPONGE AVITENE ULTRA (HEMOSTASIS) ×2 IMPLANT
INSERT FOGARTY SM (MISCELLANEOUS) IMPLANT
KIT BASIN OR (CUSTOM PROCEDURE TRAY) ×3 IMPLANT
KIT TURNOVER KIT B (KITS) ×3 IMPLANT
MARKER GRAFT CORONARY BYPASS (MISCELLANEOUS) IMPLANT
NS IRRIG 1000ML POUR BTL (IV SOLUTION) ×6 IMPLANT
PACK PERIPHERAL VASCULAR (CUSTOM PROCEDURE TRAY) ×3 IMPLANT
PAD ARMBOARD 7.5X6 YLW CONV (MISCELLANEOUS) ×6 IMPLANT
SET MICROPUNCTURE 5F STIFF (MISCELLANEOUS) IMPLANT
SPONGE LAP 18X18 X RAY DECT (DISPOSABLE) ×2 IMPLANT
STOPCOCK 4 WAY LG BORE MALE ST (IV SETS) IMPLANT
SUT ETHILON 3 0 PS 1 (SUTURE) IMPLANT
SUT GORETEX 5 0 TT13 24 (SUTURE) ×2 IMPLANT
SUT GORETEX 6.0 TT13 (SUTURE) ×8 IMPLANT
SUT GORETEX CV-5THC-13 36IN (SUTURE) ×4 IMPLANT
SUT MNCRL AB 4-0 PS2 18 (SUTURE) ×9 IMPLANT
SUT PROLENE 5 0 C 1 24 (SUTURE) ×3 IMPLANT
SUT PROLENE 6 0 BV (SUTURE) ×5 IMPLANT
SUT PROLENE 7 0 BV 1 (SUTURE) IMPLANT
SUT SILK 2 0 PERMA HAND 18 BK (SUTURE) ×2 IMPLANT
SUT SILK 3 0 (SUTURE)
SUT SILK 3-0 18XBRD TIE 12 (SUTURE) IMPLANT
SUT VIC AB 2-0 CT1 27 (SUTURE) ×6
SUT VIC AB 2-0 CT1 TAPERPNT 27 (SUTURE) ×2 IMPLANT
SUT VIC AB 3-0 SH 27 (SUTURE) ×9
SUT VIC AB 3-0 SH 27X BRD (SUTURE) ×3 IMPLANT
TAPE UMBILICAL COTTON 1/8X30 (MISCELLANEOUS) IMPLANT
TOWEL GREEN STERILE (TOWEL DISPOSABLE) ×3 IMPLANT
TRAY FOLEY MTR SLVR 16FR STAT (SET/KITS/TRAYS/PACK) ×3 IMPLANT
TUBING EXTENTION W/L.L. (IV SETS) IMPLANT
UNDERPAD 30X30 (UNDERPADS AND DIAPERS) ×3 IMPLANT
WATER STERILE IRR 1000ML POUR (IV SOLUTION) ×3 IMPLANT

## 2018-06-10 NOTE — Anesthesia Postprocedure Evaluation (Signed)
Anesthesia Post Note  Patient: Raphael Fitzpatrick Tumblin  Procedure(s) Performed: LEFT COMMON FEMORAL TO BELOW KNEE POPLITEAL BYPASS GRAFT WITH PROPATEN (Left Leg Lower)     Patient location during evaluation: PACU Anesthesia Type: General Level of consciousness: awake and alert Pain management: pain level controlled Vital Signs Assessment: post-procedure vital signs reviewed and stable Respiratory status: spontaneous breathing, nonlabored ventilation, respiratory function stable and patient connected to nasal cannula oxygen Cardiovascular status: blood pressure returned to baseline and stable Postop Assessment: no apparent nausea or vomiting Anesthetic complications: no    Last Vitals:  Vitals:   06/10/18 1740 06/10/18 1755  BP: 137/69 111/67  Pulse: 64 66  Resp: 20 19  Temp:  36.6 C  SpO2: 97% 98%    Last Pain:  Vitals:   06/10/18 1755  TempSrc:   PainSc: Asleep    LLE Motor Response: Purposeful movement;Responds to commands (06/10/18 1755) LLE Sensation: No numbness;No tingling (06/10/18 1755) RLE Motor Response: Purposeful movement;Responds to commands (06/10/18 1755) RLE Sensation: No numbness;No tingling (06/10/18 1755)      Robbi Scurlock,W. EDMOND

## 2018-06-10 NOTE — Progress Notes (Signed)
Subjective: Mr [name] was seen resting in his bed this morning. He had some intermittent chest pain overnight. The pain is describe an intermittent 1 sec, sharp pain in his left lower sternal border that resolves spontaneously and recurs every few hours or so, denies any associated symptoms. He states that his LLE pain remains adequately controlled. Patient is scheduled for revascularization with vascular surgery today. He has no other questions or concerns this AM.  Objective:  Vital signs in last 24 hours: Vitals:   06/09/18 1345 06/09/18 2035 06/10/18 0425 06/10/18 0500  BP: (!) 144/55 135/67 (!) 171/56 (!) 178/82  Pulse: 60 79 65 (!) 55  Resp: 19 19 20 20   Temp: 98.2 F (36.8 C) 98.5 F (36.9 C) 98.4 F (36.9 C)   TempSrc: Oral Oral Oral   SpO2: 95% 92% 91% 94%  Weight:      Height:       Physical Exam  Constitutional: He is oriented to person, place, and time. He appears well-developed and well-nourished. No distress.  HENT:  Head: Normocephalic and atraumatic.  Eyes: EOM are normal. Right eye exhibits no discharge. Left eye exhibits no discharge.  Cardiovascular: Normal rate, regular rhythm and intact distal pulses.  Systolic Murmur  Pulmonary/Chest: Effort normal and breath sounds normal. No respiratory distress.  Abdominal: Soft. Bowel sounds are normal. He exhibits no distension. There is no tenderness.  Musculoskeletal: He exhibits no edema.  Necrosis of 3rd 4th and 5th digits of LLE  Neurological: He is alert and oriented to person, place, and time.  Skin: Skin is warm and dry.   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 2-3 days PTA he and family noticed significant worsening of the ulcers and worsening pain. Wound necrotic on exam with foul odor, spreading to other toes this admission. Surrounding skin erythematous and edematous consistent with surronding cellulitis. Vascular to revascularized extremity and ortho will perform TMA if adequate blood flow achieved. > Cardiology has classified patient as High Risk > Revascularization today -  Appreciate Vascular Surgery; Orthopedics; and Cardiology recommendations - Echocardiogram:  EF 55-60%; G2DD - Oxycodone 5mg , increased frequency to q4h - Dilaudid 1mg  q3h PRN - Continue Zosyn - Heparin gtt - AM CBC and BMP  Chest Pain: Patient described intermittent sharp chest pain last only a second without associated symptoms overnight. No signs of ischemia on EKG.  > Patient on Hepatin for PAD - Continue to monitor  Hypokalemia: K 3.3 on AM Labs.  - 40 mEq PO - AM BMP  Anemia: Hgb stable at 9 on admission. > Hgb stable at 8.1 this AM.  - Will continue to monitor  UTI: UA with large Leukocytes and many bacteria. > Ur Culture: E. Coli > 100,000 - Patient covered with zosyn for cellulitis as above  DM: On Lantus 40U BID and Novolog 5-15U TID at home - CBGs 100s this am - Lantus 20U BID - SSI-M  HTN: On amlodipine 5mg  Daily at home. - Reduced to 2.5mg  Daily as patient starting on BB  CHF (EF 60-65%): > Weight up  3lbs from yesterday - Restart home lasix 40mg  Daily - Started on Metoprolol 12.5 BID for perioperative risk reduction   CAD (s/p CABG): Continue home ASA and Atrovastatin - Started on Metoprolol 12.5 BID for perioperative risk reduction   - Daily ASA  CKD III: Stable, Cr at baseline. A-fib: Sinus rhythm here. Not on any anticoagulation at home. HLD: Continue home  Atrovastatin Hypothyroidism: Continue home Synthroid 56mcg Daily  FEN: HH, CM VTE ppx: Heparin Code Status: FULL  Dispo: Anticipated discharge pending surgical workup.   Neva Seat, MD 06/10/2018, 7:44 AM Pager: [phone]

## 2018-06-10 NOTE — Progress Notes (Signed)
DAILY PROGRESS NOTE   Patient Name: [name] Date of Encounter: 06/10/2018  Chief Complaint   Chest pain overnight  Patient Profile   [name] is a 83 y.o. male with a hx of PAD, chronic diastolic heart failure, CKD stage IV, Afib, CAD s/p CABG (2010), IDDM, HTN, HLD, hypothyroidism, and anemia who is being seen today for the evaluation of cardiac clearance for amputation at the request of Dr. Dareen Piano.  Subjective   He had sharp chest pain last night and this morning - this is described as a sharp pain, stabbing, lasted only 1 second, then gone. EKG personally reviewed, no new ischemic changes. Plan for OR today. He is on IV heparin.   Objective   Vitals:   06/09/18 2035 06/10/18 0425 06/10/18 0500 06/10/18 0757  BP: 135/67 (!) 171/56 (!) 178/82 (!) 145/66  Pulse: 79 65 (!) 55 62  Resp: '19 20 20 18  ' Temp: 98.5 F (36.9 C) 98.4 F (36.9 C)  98 F (36.7 C)  TempSrc: Oral Oral  Oral  SpO2: 92% 91% 94% 95%  Weight:      Height:        Intake/Output Summary (Last 24 hours) at 06/10/2018 3818 Last data filed at 06/10/2018 0800 Gross per 24 hour  Intake 2082.69 ml  Output 1100 ml  Net 982.69 ml   Filed Weights   06/07/18 1245 06/08/18 0534 06/09/18 0655  Weight: 191 lb (86.6 kg) 179 lb 10.8 oz (81.5 kg) 182 lb 15.7 oz (83 kg)    Physical Exam   General appearance: alert and no distress Lungs: clear to auscultation bilaterally Heart: regular rate and rhythm and systolic murmur: early systolic 2/6, crescendo at 2nd right intercostal space Extremities: gangrenous 3-5th digits on the left foot Neurologic: Grossly normal  Inpatient Medications    Scheduled Meds: . amLODipine  2.5 mg Oral Daily  . aspirin  81 mg Oral QHS  . atorvastatin  40 mg Oral QHS  . feeding supplement (GLUCERNA SHAKE)  237 mL Oral BID BM  . furosemide  40 mg Oral BID  . insulin aspart  0-15 Units Subcutaneous TID WC  . insulin glargine  20 Units Subcutaneous BID  .  levothyroxine  50 mcg Oral QAC breakfast  . metoprolol tartrate  12.5 mg Oral BID  . nutrition supplement (JUVEN)  1 packet Oral BID BM  . oxyCODONE  5 mg Oral Q4H    Continuous Infusions: . sodium chloride 75 mL/hr at 06/09/18 2107  . heparin 1,350 Units/hr (06/10/18 0800)  . piperacillin-tazobactam (ZOSYN)  IV 3.375 g (06/10/18 0620)    PRN Meds: acetaminophen **OR** acetaminophen, gi cocktail, HYDROmorphone (DILAUDID) injection, ondansetron **OR** ondansetron (ZOFRAN) IV, polyethylene glycol   Labs   Results for orders placed or performed during the hospital encounter of 06/07/18 (from the past 48 hour(s))  MRSA PCR Screening     Status: None   Collection Time: 06/08/18 10:02 AM  Result Value Ref Range   MRSA by PCR NEGATIVE NEGATIVE    Comment:        The GeneXpert MRSA Assay (FDA approved for NASAL specimens only), is one component of a comprehensive MRSA colonization surveillance program. It is not intended to diagnose MRSA infection nor to guide or monitor treatment for MRSA infections. Performed at [facility], [place] [address], [place], [place] [address]   Heparin level (unfractionated)     Status: None   Collection Time: 06/08/18 11:09 AM  Result Value  Ref Range   Heparin Unfractionated 0.34 0.30 - 0.70 IU/mL    Comment: (NOTE) If heparin results are below expected values, and patient dosage has  been confirmed, suggest follow up testing of antithrombin III levels. Performed at [facility], [place] [address], [place], [place] [address]   CBC     Status: Abnormal   Collection Time: 06/08/18 11:09 AM  Result Value Ref Range   WBC 11.6 (H) 4.0 - 10.5 K/uL   RBC 2.63 (L) 4.22 - 5.81 MIL/uL   Hemoglobin 7.6 (L) 13.0 - 17.0 g/dL   HCT 25.0 (L) 39.0 - 52.0 %   MCV 95.1 78.0 - 100.0 fL   MCH 28.9 26.0 - 34.0 pg   MCHC 30.4 30.0 - 36.0 g/dL   RDW 13.6 11.5 - 15.5 %   Platelets 217 150 - 400 K/uL    Comment: Performed at [facility], [place] [address], [place], [place] [address]  Glucose, capillary     Status: Abnormal   Collection Time: 06/08/18 11:29 AM  Result Value Ref Range   Glucose-Capillary 200 (H) 70 - 99 mg/dL   Comment 1 Notify RN   Glucose, capillary     Status: Abnormal   Collection Time: 06/08/18  4:17 PM  Result Value Ref Range   Glucose-Capillary 184 (H) 70 - 99 mg/dL   Comment 1 Notify RN   Glucose, capillary     Status: Abnormal   Collection Time: 06/08/18  9:27 PM  Result Value Ref Range   Glucose-Capillary 272 (H) 70 - 99 mg/dL   Comment 1 Notify RN    Comment 2 Document in Chart   CBC     Status: Abnormal   Collection Time: 06/09/18  5:02 AM  Result Value Ref Range   WBC 9.8 4.0 - 10.5 K/uL   RBC 2.74 (L) 4.22 - 5.81 MIL/uL   Hemoglobin 7.9 (L) 13.0 - 17.0 g/dL   HCT 26.0 (L) 39.0 - 52.0 %   MCV 94.9 78.0 - 100.0 fL   MCH 28.8 26.0 - 34.0 pg   MCHC 30.4 30.0 - 36.0 g/dL   RDW 13.4 11.5 - 15.5 %   Platelets 234 150 - 400 K/uL    Comment: Performed at [facility], [place] [address], [place], [place] [address]  Heparin level (unfractionated)     Status: None   Collection Time: 06/09/18  5:02 AM  Result Value Ref Range   Heparin Unfractionated 0.31 0.30 - 0.70 IU/mL    Comment: (NOTE) If heparin results are below expected values, and patient dosage has  been confirmed, suggest follow up testing of antithrombin III levels. Performed at [facility], [place] [address] [place], [place] [address]   Basic metabolic panel     Status: Abnormal   Collection Time: 06/09/18  5:02 AM  Result Value Ref Range   Sodium 136 135 - 145 mmol/L   Potassium 3.6 3.5 - 5.1 mmol/L   Chloride 102 98 - 111 mmol/L    Comment: Please note change in reference range.   CO2 24 22 - 32 mmol/L   Glucose, Bld 223 (H) 70 - 99 mg/dL    Comment: Please note change in reference range.   BUN 35 (H) 8 - 23 mg/dL    Comment: Please note change in reference range.   Creatinine, Ser 2.36 (H) 0.61 - 1.24 mg/dL   Calcium 8.3  (L) 8.9 - 10.3 mg/dL   GFR  calc non Af Amer 24 (L) >60 mL/min   GFR calc Af Amer 28 (L) >60 mL/min    Comment: (NOTE) The eGFR has been calculated using the CKD EPI equation. This calculation has not been validated in all clinical situations. eGFR's persistently <60 mL/min signify possible Chronic Kidney Disease.    Anion gap 10 5 - 15    Comment: Performed at [place] [address], [place], [place] [address]  Glucose, capillary     Status: Abnormal   Collection Time: 06/09/18  6:16 AM  Result Value Ref Range   Glucose-Capillary 216 (H) 70 - 99 mg/dL  Glucose, capillary     Status: Abnormal   Collection Time: 06/09/18 11:23 AM  Result Value Ref Range   Glucose-Capillary 197 (H) 70 - 99 mg/dL  Glucose, capillary     Status: Abnormal   Collection Time: 06/09/18  4:05 PM  Result Value Ref Range   Glucose-Capillary 155 (H) 70 - 99 mg/dL   Comment 1 Notify RN   Glucose, capillary     Status: Abnormal   Collection Time: 06/09/18  9:30 PM  Result Value Ref Range   Glucose-Capillary 192 (H) 70 - 99 mg/dL   Comment 1 Notify RN    Comment 2 Document in Chart   CBC     Status: Abnormal   Collection Time: 06/10/18  4:20 AM  Result Value Ref Range   WBC 8.9 4.0 - 10.5 K/uL   RBC 2.83 (L) 4.22 - 5.81 MIL/uL   Hemoglobin 8.1 (L) 13.0 - 17.0 g/dL   HCT 26.6 (L) 39.0 - 52.0 %   MCV 94.0 78.0 - 100.0 fL   MCH 28.6 26.0 - 34.0 pg   MCHC 30.5 30.0 - 36.0 g/dL   RDW 13.3 11.5 - 15.5 %   Platelets 232 150 - 400 K/uL    Comment: Performed at [facility], [place] [address] [place], [place] [address]  Heparin level (unfractionated)     Status: None   Collection Time: 06/10/18  4:20 AM  Result Value Ref Range   Heparin Unfractionated 0.35 0.30 - 0.70 IU/mL    Comment: (NOTE) If heparin results are below expected values, and patient dosage has  been confirmed, suggest follow up testing of antithrombin III levels. Performed at [facility], [place] [address]  [place], [place] [address]   Basic metabolic panel     Status: Abnormal   Collection Time: 06/10/18  4:20 AM  Result Value Ref Range   Sodium 136 135 - 145 mmol/L   Potassium 3.3 (L) 3.5 - 5.1 mmol/L   Chloride 103 98 - 111 mmol/L    Comment: Please note change in reference range.   CO2 25 22 - 32 mmol/L   Glucose, Bld 165 (H) 70 - 99 mg/dL    Comment: Please note change in reference range.   BUN 30 (H) 8 - 23 mg/dL    Comment: Please note change in reference range.   Creatinine, Ser 2.10 (H) 0.61 - 1.24 mg/dL   Calcium 8.2 (L) 8.9 - 10.3 mg/dL   GFR calc non Af Amer 28 (L) >60 mL/min   GFR calc Af Amer 32 (L) >60 mL/min    Comment: (NOTE) The eGFR has been calculated using the CKD EPI equation. This calculation has not been validated in all clinical situations. eGFR's persistently <60 mL/min signify possible Chronic Kidney Disease.    Anion gap 8 5 - 15  Comment: Performed at [facility], [place] [address], [place], [place] [address]  Glucose, capillary     Status: Abnormal   Collection Time: 06/10/18  6:13 AM  Result Value Ref Range   Glucose-Capillary 174 (H) 70 - 99 mg/dL    ECG   NSR with prior inferior infarct pattern, no new ischemic changes - Personally Reviewed  Telemetry   Sinus rhythm - Personally Reviewed  Radiology    No results found.  Cardiac Studies   N/A  Assessment   1. Active Problems: 2.   Atherosclerosis of native arteries of extremities with gangrene, left leg (HCC) 3.   Necrotic toes ([place]) 4.   PAD (peripheral artery disease) ([place]) 5.   Preoperative cardiovascular examination 6.   S/P CABG (coronary artery bypass graft) 7.   Severe protein-calorie malnutrition ([place]) 8.   Uncontrolled type 2 diabetes mellitus with polyneuropathy ([place]) 9.   Plan   1. Chest pain is atypical for angina - advise proceeding with revascularization attempt to minimize amputation if possible. On ASA, BB and heparin.  Time Spent Directly with Patient:  I  have spent a total of 25 minutes with the patient reviewing hospital notes, telemetry, EKGs, labs and examining the patient as well as establishing an assessment and plan that was discussed personally with the patient. > 50% of time was spent in direct patient care.  Length of Stay:  LOS: 3 days   Pixie Casino, MD, [facility], [place] Director of the Advanced Lipid Disorders &  Cardiovascular Risk Reduction Clinic Diplomate of the American Board of Clinical Lipidology Attending Cardiologist  Direct Dial: [phone]  Fax: [phone]  Website:  [email or web address]  Nadean Corwin Korie Brabson 06/10/2018, 8:21 AM

## 2018-06-10 NOTE — Anesthesia Procedure Notes (Signed)
Procedure Name: Intubation Date/Time: 06/10/2018 1:44 PM Performed by: Freddie Breech, CRNA Pre-anesthesia Checklist: Patient identified, Emergency Drugs available, Suction available and Patient being monitored Patient Re-evaluated:Patient Re-evaluated prior to induction Oxygen Delivery Method: Circle System Utilized Preoxygenation: Pre-oxygenation with 100% oxygen Induction Type: IV induction Ventilation: Mask ventilation without difficulty Laryngoscope Size: Miller and 2 Grade View: Grade I Tube type: Oral Tube size: 7.0 mm Number of attempts: 1 Airway Equipment and Method: Stylet and Oral airway Placement Confirmation: ETT inserted through vocal cords under direct vision,  positive ETCO2 and breath sounds checked- equal and bilateral Tube secured with: Tape Dental Injury: Teeth and Oropharynx as per pre-operative assessment

## 2018-06-10 NOTE — Progress Notes (Signed)
Pharmacy Antibiotic Note  [name] is a 83 y.o. male admitted on 06/07/2018 with foot pain.  Pharmacy has been consulted for zosyn dosing for gangrene. Pt is afebrile and WBC now WNL. SCr is also elevated. Pt is undergoing revascularization procedure 6/27, will follow-up the need for further procedures and length of  antibiotic therapy.  Plan: Zosyn 3.375gm IV Q8H (4 hr inf) F/u renal fxn, C&S, clinical status   Height: 5\' 5"  (165.1 cm) Weight: 182 lb 15.7 oz (83 kg) IBW/kg (Calculated) : 61.5  Temp (24hrs), Avg:98.3 F (36.8 C), Min:98 F (36.7 C), Max:98.5 F (36.9 C)  Recent Labs  Lab 06/03/18 1136 06/07/18 1257 06/07/18 1450 06/08/18 0049 06/08/18 1109 06/09/18 0502 06/10/18 0420  WBC  --  17.1*  --  12.1* 11.6* 9.8 8.9  CREATININE 2.60* 2.58*  --  2.50*  --  2.36* 2.10*  LATICACIDVEN  --  0.9 1.0  --   --   --   --     Estimated Creatinine Clearance: 27.4 mL/min (A) (by C-G formula based on SCr of 2.1 mg/dL (H)).    No Known Allergies  Antimicrobials this admission: Zosyn 6/24>>  Dose adjustments this admission: N/A  Microbiology results: UCx: Ecoli, R ampicillin, Unasyn  Thank you for allowing pharmacy to be a part of this patient's care.  Jodean Lima Alvin Rubano 06/10/2018 10:24 AM

## 2018-06-10 NOTE — Progress Notes (Signed)
CRITICAL VALUE ALERT  Critical Value:  Troponin 0.03  Date & Time Notied:  06/10/18 0915  Provider Notified: Riley Churches

## 2018-06-10 NOTE — Transfer of Care (Signed)
Immediate Anesthesia Transfer of Care Note  Patient: [name]  Procedure(s) Performed: LEFT COMMON FEMORAL TO BELOW KNEE POPLITEAL BYPASS GRAFT WITH PROPATEN (Left Leg Lower)  Patient Location: PACU  Anesthesia Type:General  Level of Consciousness: awake, alert  and oriented  Airway & Oxygen Therapy: Patient Spontanous Breathing and Patient connected to nasal cannula oxygen  Post-op Assessment: Report given to RN and Post -op Vital signs reviewed and stable  Post vital signs: Reviewed and stable  Last Vitals:  Vitals Value Taken Time  BP 139/69 06/10/2018  4:58 PM  Temp    Pulse 80 06/10/2018  5:02 PM  Resp 21 06/10/2018  5:02 PM  SpO2 94 % 06/10/2018  5:02 PM  Vitals shown include unvalidated device data.  Last Pain:  Vitals:   06/10/18 1658  TempSrc:   PainSc: (P) 0-No pain      Patients Stated Pain Goal: 0 (59/93/57 0177)  Complications: No apparent anesthesia complications

## 2018-06-10 NOTE — Progress Notes (Signed)
Pt with some intermittent CP overnight, rates 7/10 sharp pain over heart. Maalox given with little relief. EKG obtained this AM. Internal medicine notified, Dickie La notified, Roby Lofts PA-C notified. Will continue to monitor.  Clyde Canterbury, RN

## 2018-06-10 NOTE — Progress Notes (Signed)
Internal Medicine Attending:   I saw and examined the patient. I reviewed the resident's note and I agree with the resident's findings and plan as documented in the resident's note.  Patient complained of some intermittent chest pain overnight.  Patient describes the pain as sharp in his left lower sternal border lasting only 1 to 2 seconds at a time and then resolving spontaneously.  Patient was initially admitted for worsening pain in his left foot with associated gangrene in his third, fourth and fifth toes.  Vascular surgery follow-up and recommendations appreciated.  Patient scheduled to undergo revascularization procedure today.  If patient is able to achieve adequate revascularization with this procedure he will then go for a transmetatarsal amputation with Dr. Sharol Given.  If he is unable to achieve adequate revascularization he may need an AKA or BKA.  Continue with pain control and IV Zosyn for now.  Heparin drip on hold in preparation for his procedure.  Of note, patient complained of intermittent chest pain overnight.  This chest pain is atypical for angina.  His troponin was 0.03 and his EKG showed no acute ST/T wave changes.  Cardiology follow-up and recommendations appreciated.  Patient okay for proceeding with revascularization procedure. No further work-up at this time.

## 2018-06-10 NOTE — Progress Notes (Signed)
Per Dr. Bridgett Larsson, stop Heparin gtt.

## 2018-06-10 NOTE — Op Note (Signed)
OPERATIVE NOTE   PROCEDURE: left common femoral artery to below-the-knee popliteal artery bypass with Propaten  PRE-OPERATIVE DIAGNOSIS: left foot gangrene  POST-OPERATIVE DIAGNOSIS: same as above   SURGEON: Adele Barthel, MD  ASSISTANT(S): Leontine Locket, PAC   ANESTHESIA: general  ESTIMATED BLOOD LOSS: 400 cc  FINDING(S): 1.  Calcified common femoral artery and below-the-knee popliteal artery  2.  Dopplerable posterior tibial artery at end of case  SPECIMEN(S):  none  INDICATIONS:   [name] is a 83 y.o. male who presents with left foot gangrene.  Patient has known left superficial femoral artery occlusion with single vessel runoff via peroneal artery.  I offered the patient a left femoropopliteal bypass with Propaten.  The patient risk stratified to high risk from a cardiac viewpoint.  The patient is aware he is at higher risk of mortality.  I estimated 5-10% given his multiple co-morbidities.  The risk, benefits, and alternative for bypass operations were discussed with the patient.  The patient is aware the risks include but are not limited to: bleeding, infection, myocardial infarction, stroke, limb loss, nerve damage, limb edema, need for additional procedures in the future, wound complications, and inability to complete the bypass. I discussed with the patient that he has significant tibial artery disease with only peroneal artery runoff.  He also has significant digital artery disease in the foot that may prevent salvage of his foot.  The patient is aware of these risks and agreed to proceed.     DESCRIPTION: After full informed written consent was obtained, the patient was brought back to the operating room and placed supine upon the operating table.  Prior to induction, the patient was given intravenous antibiotics.  After obtaining adequate anesthesia, the patient was prepped and draped in the standard fashion for a femoral to popliteal bypass operation.  Attention  was turned to the left groin.  An oblique incision was made over the left common femoral artery.  Using blunt dissection and electrocautery, the common femoral artery artery was dissected out.  I limited the dissection to purely the common femoral artery to speed up the procedure, due to his cardiac risks.  This common femoral artery was found on exam to be calcified but compressible.  At this point, attention was turned to the calf.  An longitudinal incision was made one finger-width posterior to the tibia.  Using blunt dissection and electrocautery, a plane was developed through the subcutaneous tissue and fascia down to the popliteal space.  The popliteal vein was dissected out and retracted medially and posteriorly.  The below-the-knee popliteal artery was dissected away from lateral popliteal vein.  This popliteal artery was found on exam to be calcified but compressible proximally.    At this point, I bluntly dissected a space between the femoral condyles adjacent to the below-the-knee popliteal vessels.  I bluntly passed the long Gore metal tunneler between the femoral condyles in a subsartorial fashion to the groin incision.  The bullet on the tunneler was removed and then 6 mm Propaten was sewn to the inner cannula with a 2-0 Silk.  I passed the conduit through the metal tunnel, taking care to maintain the orientation of the conduit.    At this point, the patient was given 8500 units of Heparin intravenously, which was a therapeutic bolus.  After waiting three minutes, I clamped the left common femoral artery proximally and distally.  An incision was made in the common femoral artery and extended proximally and distally with  a Engineer, drilling.  The proximal Propaten graft was spatulated to the dimensions of the arteriotomy.  The conduit was sewn to the common femoral artery with a running stitch of CV-5.  Prior to completing this anastomosis, I pulled up on the suture line with a nerve hook.  I also  allowed both ends of the common femoral artery to bleed.   No thrombus was noted.  The anastomosis was completed in the usual fashion.  I packed the left groin with Avitene.  Attention was then turned to the popliteal exposure.   I reset the exposure of the popliteal space.  I verified the popliteal artery was appropriately marked.  I determine the target segment for the anastomosis.  I applied tension to the artery proximally and distally with vessel loops.  I made an incision with a 11-blade in the artery and extended it proximally and distally with a Potts scissor.  This below-the-knee popliteal artery was diseased with significant calcific in the wall.  I pulled the Propaten graft to appropriate tension and length, taking into account straightening out the leg.  I adjusted the length of the conduit sharply.  I spatulated this graft to meet the dimensions of the arteriotomy.  The conduit was sewn to the common femoral artery with a running stitch of CV-6.  Prior to completing this anastomosis, all vessels were backbled.  No thrombus was noted from any vessels and backbleeding was: minimal.  The bypass conduit was allowed to bleed in an antegrade fashion.  The bleeding was: pulsatile.  The anastomosis was completed in the usual fashion.  At this point, all incisions were washed out and Avitene was placed into both incisions.  The continuous doppler exam demonstrated distally: dopplerable posterior tibial artery signal.  This signal disappeared with compression of the graft.  At this point, bleeding in both incisions were controlled with electrocautery and suture ligature.  There were multiple areas in the below-the-knee anastomosis that had to be repaired.  The diseased popliteal artery had developed some tears with full arterial pressure.  I repaired this tears with CV-6 stitches.    I gave the patient 50 mg of Protamine to reverse anticoagulation.  After few rounds of Avitene, no further active bleeding  was present.  I washed out both incisions.  The calf was repaired with interrupted 2-0 Vicryl deep sutures to reapproximate the deep muscles and a running stitch of 3-0 Vicryl in the subcutaneous tissue.  The skin was reapproximated with a running subcuticular of 4-0 Vicryl.  After cleaning and drying the skin, the skin was reinforced with Dermabond.  Attention was turned to the left groin.  The groin was repaired with a double layer of 2-0 Vicryl immediately superficial to the bypass conduit.  The superficial subcutaneous tissue was reapproximated with a double layer of 3-0 Vicryl.  The skin was reapproximated with a running subcuticular of 4-0 Vicryl.  The skin was cleaned, dried, and reinforced with Dermabond.    COMPLICATIONS: none  CONDITION: stable   Adele Barthel, MD, [facility] Office: [phone] Pager: [phone]  06/10/2018, 4:43 PM

## 2018-06-10 NOTE — Discharge Instructions (Signed)
 [facility]  Discharge instructions  Lower Extremity Bypass Surgery  Please refer to the following instruction for your post-procedure care. Your surgeon or physician assistant will discuss any changes with you.  Activity  You are encouraged to walk as much as you can. You can slowly return to normal activities during the month after your surgery. Avoid strenuous activity and heavy lifting until your doctor tells you it's OK. Avoid activities such as vacuuming or swinging a golf club. Do not drive until your doctor give the OK and you are no longer taking prescription pain medications. It is also normal to have difficulty with sleep habits, eating and bowel movement after surgery. These will go away with time.  Bathing/Showering  Shower daily after you go home. Do not soak in a bathtub, hot tub, or swim until the incision heals completely.  Incision Care  Clean your incision with mild soap and water. Shower every day. Pat the area dry with a clean towel. You do not need a bandage unless otherwise instructed. Do not apply any ointments or creams to your incision. If you have open wounds you will be instructed how to care for them or a visiting nurse may be arranged for you. If you have staples or sutures along your incision they will be removed at your post-op appointment. You may have skin glue on your incision. Do not peel it off. It will come off on its own in about one week.  Wash the groin wound with soap and water daily and pat dry. (No tub bath-only shower)  Then put a dry gauze or washcloth in the groin to keep this area dry to help prevent wound infection.  Do this daily and as needed.  Do not use Vaseline or neosporin on your incisions.  Only use soap and water on your incisions and then protect and keep dry.  Diet  Resume your normal diet. There are no special food restrictions following this procedure. A low fat/ low cholesterol diet is  recommended for all patients with vascular disease. In order to heal from your surgery, it is CRITICAL to get adequate nutrition. Your body requires vitamins, minerals, and protein. Vegetables are the best source of vitamins and minerals. Vegetables also provide the perfect balance of protein. Processed food has little nutritional value, so try to avoid this.  Medications  Resume taking all your medications unless your doctor or physician assistant tells you not to. If your incision is causing pain, you may take over-the-counter pain relievers such as acetaminophen (Tylenol). If you were prescribed a stronger pain medication, please aware these medication can cause nausea and constipation. Prevent nausea by taking the medication with a snack or meal. Avoid constipation by drinking plenty of fluids and eating foods with high amount of fiber, such as fruits, vegetables, and grains. Take Colace 100 mg (an over-the-counter stool softener) twice a day as needed for constipation.  Do not take Tylenol if you are taking prescription pain medications.  Follow Up  Our office will schedule a follow up appointment 2-3 weeks following discharge.  Please call us immediately for any of the following conditions  Severe or worsening pain in your legs or feet while at rest or while walking Increase pain, redness, warmth, or drainage (pus) from your incision site(s) Fever of 101 degree or higher The swelling in your leg with the bypass suddenly worsens and becomes more painful than when you were in the hospital If you have   been instructed to feel your graft pulse then you should do so every day. If you can no longer feel this pulse, call the office immediately. Not all patients are given this instruction.  Leg swelling is common after leg bypass surgery.  The swelling should improve over a few months following surgery. To improve the swelling, you may elevate your legs above the level of your heart while you are  sitting or resting. Your surgeon or physician assistant may ask you to apply an ACE wrap or wear compression (TED) stockings to help to reduce swelling.  Reduce your risk of vascular disease  Stop smoking. If you would like help call [facility] at 1-800-QUIT-NOW (1-800-784-8669) or [facility] at 336-586-4000.  Manage your cholesterol Maintain a desired weight Control your diabetes weight Control your diabetes Keep your blood pressure down  If you have any questions, please call the office at [phone]  

## 2018-06-10 NOTE — Anesthesia Preprocedure Evaluation (Signed)
Anesthesia Evaluation  Patient identified by MRN, date of birth, ID band Patient awake    Reviewed: Allergy & Precautions, H&P , NPO status , Patient's Chart, lab work & pertinent test results  Airway Mallampati: II  TM Distance: >3 FB Neck ROM: Full    Dental no notable dental hx. (+) Partial Upper, Partial Lower, Dental Advisory Given   Pulmonary neg pulmonary ROS,    Pulmonary exam normal breath sounds clear to auscultation       Cardiovascular hypertension, Pt. on medications + CAD, + Past MI, + CABG, + Peripheral Vascular Disease and +CHF  + dysrhythmias Atrial Fibrillation  Rhythm:Regular Rate:Normal     Neuro/Psych negative neurological ROS  negative psych ROS   GI/Hepatic negative GI ROS, Neg liver ROS,   Endo/Other  diabetes, Insulin DependentHypothyroidism   Renal/GU Renal InsufficiencyRenal disease  negative genitourinary   Musculoskeletal   Abdominal   Peds  Hematology negative hematology ROS (+) anemia ,   Anesthesia Other Findings   Reproductive/Obstetrics negative OB ROS                             Anesthesia Physical Anesthesia Plan  ASA: III  Anesthesia Plan: General   Post-op Pain Management:    Induction: Intravenous  PONV Risk Score and Plan: 3 and Ondansetron and Treatment may vary due to age or medical condition  Airway Management Planned: Oral ETT  Additional Equipment:   Intra-op Plan:   Post-operative Plan: Extubation in OR  Informed Consent: I have reviewed the patients History and Physical, chart, labs and discussed the procedure including the risks, benefits and alternatives for the proposed anesthesia with the patient or authorized representative who has indicated his/her understanding and acceptance.   Dental advisory given  Plan Discussed with: CRNA  Anesthesia Plan Comments:         Anesthesia Quick Evaluation

## 2018-06-11 ENCOUNTER — Inpatient Hospital Stay (HOSPITAL_COMMUNITY): Payer: Medicare HMO

## 2018-06-11 ENCOUNTER — Encounter (HOSPITAL_COMMUNITY): Payer: Self-pay | Admitting: Vascular Surgery

## 2018-06-11 ENCOUNTER — Telehealth: Payer: Self-pay | Admitting: Vascular Surgery

## 2018-06-11 DIAGNOSIS — I96 Gangrene, not elsewhere classified: Secondary | ICD-10-CM

## 2018-06-11 DIAGNOSIS — Z9862 Peripheral vascular angioplasty status: Secondary | ICD-10-CM

## 2018-06-11 LAB — GLUCOSE, CAPILLARY
GLUCOSE-CAPILLARY: 240 mg/dL — AB (ref 70–99)
Glucose-Capillary: 279 mg/dL — ABNORMAL HIGH (ref 70–99)
Glucose-Capillary: 287 mg/dL — ABNORMAL HIGH (ref 70–99)
Glucose-Capillary: 195 mg/dL — ABNORMAL HIGH (ref 70–99)

## 2018-06-11 LAB — TYPE AND SCREEN
ABO/RH(D): AB POS
Antibody Screen: NEGATIVE
UNIT DIVISION: 0
Unit division: 0

## 2018-06-11 LAB — BPAM RBC
BLOOD PRODUCT EXPIRATION DATE: 201907212359
Blood Product Expiration Date: 201907212359
ISSUE DATE / TIME: 201906271414
ISSUE DATE / TIME: 201906271414
UNIT TYPE AND RH: 8400
Unit Type and Rh: 8400

## 2018-06-11 LAB — CBC
HCT: 33.5 % — ABNORMAL LOW (ref 39.0–52.0)
Hemoglobin: 10.8 g/dL — ABNORMAL LOW (ref 13.0–17.0)
MCH: 29.2 pg (ref 26.0–34.0)
MCHC: 32.2 g/dL (ref 30.0–36.0)
MCV: 90.5 fL (ref 78.0–100.0)
PLATELETS: 185 10*3/uL (ref 150–400)
RBC: 3.7 MIL/uL — AB (ref 4.22–5.81)
RDW: 14.5 % (ref 11.5–15.5)
WBC: 10.5 10*3/uL (ref 4.0–10.5)

## 2018-06-11 LAB — BASIC METABOLIC PANEL
Anion gap: 11 (ref 5–15)
BUN: 33 mg/dL — AB (ref 8–23)
CO2: 23 mmol/L (ref 22–32)
Calcium: 8.4 mg/dL — ABNORMAL LOW (ref 8.9–10.3)
Chloride: 103 mmol/L (ref 98–111)
Creatinine, Ser: 2.18 mg/dL — ABNORMAL HIGH (ref 0.61–1.24)
GFR calc Af Amer: 31 mL/min — ABNORMAL LOW (ref >60)
GFR, EST NON AFRICAN AMERICAN: 27 mL/min — AB (ref >60)
Glucose, Bld: 300 mg/dL — ABNORMAL HIGH (ref 70–99)
POTASSIUM: 4.5 mmol/L (ref 3.5–5.1)
SODIUM: 137 mmol/L (ref 135–145)

## 2018-06-11 MED ORDER — INSULIN GLARGINE 100 UNIT/ML ~~LOC~~ SOLN
25.0000 [IU] | Freq: Two times a day (BID) | SUBCUTANEOUS | Status: DC
  Administered 2018-06-11 – 2018-06-15 (×8): 25 [IU] via SUBCUTANEOUS
  Filled 2018-06-11 (×8): qty 0.25

## 2018-06-11 NOTE — Progress Notes (Signed)
Internal Medicine Attending:   I saw and examined the patient. I reviewed the resident's note and I agree with the resident's findings and plan as documented in the resident's note.  Patient is status post left femoropopliteal bypass yesterday by vascular surgery.  Patient states that he feels well with no new complaints.  He has persistent pain in his left foot which he states is well controlled with his current pain medications.  Patient was initially admitted with worsening left foot pain and gangrene of his third fourth and fifth toes on that side.  Vascular surgery follow-up recommendations appreciated.  Patient now has a dopplerable left anterior tibial and left posterior tibial arteries.  Ortho to follow-up to assess if patient is a candidate for TMA or whether he would need an AKA or BKA.  Continue with pain control.  Continue with IV antibiotics (Zosyn). Blood cultures remain negative to date.  No further work-up at this time.  Cardiology follow-up recommendations appreciated.  Patient with no peri-op symptoms of ACS.  He will need to follow-up with cardiology as an outpatient.

## 2018-06-11 NOTE — Evaluation (Signed)
Physical Therapy Evaluation Patient Details Name: [name] MRN: [id number] DOB: [birth year] Today's Date: 06/11/2018   History of Present Illness  Pt adm with lt foot gangrene. Underwent lt femoral to below knee popliteal bypass on 06/10/18. PMH - cad, afib, dm, chf, ckd, htn, pad, cabg  Clinical Impression  Pt presents to PT with decr mobility after LLE bypass and necrotic toes. Currently pt able to amb some but pt likely to have amputation involving lt foot. Expect a decline in mobility post amputation especially if weight bearing is limited. Therefore expect pt may need ST-SNF if that occurs.     Follow Up Recommendations SNF;Supervision/Assistance - 24 hour(if pt's weight bearing is limited after amputation)    Equipment Recommendations  Other (comment)(To be assessed)    Recommendations for Other Services       Precautions / Restrictions Precautions Precautions: Fall Restrictions Weight Bearing Restrictions: No      Mobility  Bed Mobility Overal bed mobility: Needs Assistance Bed Mobility: Supine to Sit     Supine to sit: Min assist     General bed mobility comments: Assist to elevate trunk and bring hips to EOB.  Transfers Overall transfer level: Needs assistance Equipment used: Rolling walker (2 wheeled) Transfers: Sit to/from Stand Sit to Stand: Mod assist         General transfer comment: Assist to bring hips up and for balance. Posterior lean initially.  Ambulation/Gait Ambulation/Gait assistance: Min assist Gait Distance (Feet): 25 Feet Assistive device: Rolling walker (2 wheeled) Gait Pattern/deviations: Step-to pattern;Decreased step length - right;Decreased step length - left;Trunk flexed Gait velocity: decr Gait velocity interpretation: <1.31 ft/sec, indicative of household ambulator General Gait Details: Assist for balance and support. Initially verbal cues for gait sequence. Occasional posterior lean.  Stairs            Wheelchair  Mobility    Modified Rankin (Stroke Patients Only)       Balance Overall balance assessment: Needs assistance Sitting-balance support: Bilateral upper extremity supported;Feet supported Sitting balance-Leahy Scale: Poor Sitting balance - Comments: Initial min assist and then supervision but UE support Postural control: Posterior lean Standing balance support: Bilateral upper extremity supported;No upper extremity supported Standing balance-Leahy Scale: Poor Standing balance comment: Initial mod assist due to posterior lean and then improved to min assist for static standing                             Pertinent Vitals/Pain Pain Assessment: Faces Faces Pain Scale: Hurts even more Pain Location: lt foot Pain Descriptors / Indicators: Guarding Pain Intervention(s): Limited activity within patient's tolerance;Monitored during session;Premedicated before session    Home Living Family/patient expects to be discharged to:: Private residence Living Arrangements: Alone Available Help at Discharge: Family;Available 24 hours/day Type of Home: House Home Access: Level entry     Home Layout: One level Home Equipment: Walker - 2 wheels      Prior Function Level of Independence: Independent               Hand Dominance        Extremity/Trunk Assessment   Upper Extremity Assessment Upper Extremity Assessment: Defer to OT evaluation    Lower Extremity Assessment Lower Extremity Assessment: Generalized weakness;LLE deficits/detail LLE Deficits / Details: Limited by pain       Communication   Communication: No difficulties  Cognition Arousal/Alertness: Awake/alert Behavior During Therapy: WFL for tasks assessed/performed Overall Cognitive Status: Within Functional Limits  for tasks assessed                                        General Comments      Exercises     Assessment/Plan    PT Assessment Patient needs continued PT services   PT Problem List Decreased strength;Decreased activity tolerance;Decreased balance;Decreased mobility;Decreased knowledge of use of DME;Pain       PT Treatment Interventions DME instruction;Gait training;Functional mobility training;Therapeutic activities;Therapeutic exercise;Balance training;Patient/family education    PT Goals (Current goals can be found in the Care Plan section)  Acute Rehab PT Goals Patient Stated Goal: return home PT Goal Formulation: With patient Time For Goal Achievement: 06/25/18 Potential to Achieve Goals: Good    Frequency Min 3X/week   Barriers to discharge        Co-evaluation               AM-PAC PT "6 Clicks" Daily Activity  Outcome Measure Difficulty turning over in bed (including adjusting bedclothes, sheets and blankets)?: Unable Difficulty moving from lying on back to sitting on the side of the bed? : Unable Difficulty sitting down on and standing up from a chair with arms (e.g., wheelchair, bedside commode, etc,.)?: Unable Help needed moving to and from a bed to chair (including a wheelchair)?: A Lot Help needed walking in hospital room?: A Little Help needed climbing 3-5 steps with a railing? : Total 6 Click Score: 9    End of Session Equipment Utilized During Treatment: Gait belt Activity Tolerance: Patient tolerated treatment well Patient left: in bed;with call bell/phone within reach;with family/visitor present Nurse Communication: Mobility status PT Visit Diagnosis: Unsteadiness on feet (R26.81);Other abnormalities of gait and mobility (R26.89);Muscle weakness (generalized) (M62.81);Pain Pain - Right/Left: Left Pain - part of body: Ankle and joints of foot    Time: 0354-6568 PT Time Calculation (min) (ACUTE ONLY): 41 min   Charges:   PT Evaluation $PT Eval Moderate Complexity: 1 Mod PT Treatments $Gait Training: 23-37 mins   PT G Codes:        [facility] PT [place] 06/11/2018, 1:18 PM

## 2018-06-11 NOTE — Progress Notes (Signed)
Inpatient Diabetes Program Recommendations  AACE/ADA: New Consensus Statement on Inpatient Glycemic Control (2015)  Target Ranges:  Prepandial:   less than 140 mg/dL      Peak postprandial:   less than 180 mg/dL (1-2 hours)      Critically ill patients:  140 - 180 mg/dL   Lab Results  Component Value Date   GLUCAP 287 (H) 06/11/2018   HGBA1C 8.3 (H) 05/31/2018    Diabetes history: Type 2 DM  Outpatient Diabetes medications: Lantus 40 units bid, Novolog 5-15 units tid with meals Current orders for Inpatient glycemic control:  Novolog moderate tid with meals, Lantus 25 units bid  Inpatient Diabetes Program Recommendations:   Consider adding Novolog meal coverage 4 units tid with meals (hold if patient eats less than 50%).   Thanks,  Adah Perl, RN, BC-ADM Inpatient Diabetes Coordinator Pager [phone] (8a-5p)

## 2018-06-11 NOTE — Care Management Important Message (Signed)
Important Message  Patient Details  Name: [name] MRN: [id number] Date of Birth: 09/28/1935   Medicare Important Message Given:  Yes    [name] 06/11/2018, 3:27 PM

## 2018-06-11 NOTE — Progress Notes (Signed)
OT Cancellation Note  Patient Details Name: [name] MRN: [id number] DOB: [birth year]   Cancelled Treatment:    Reason Eval/Treat Not Completed: Patient at procedure or test/ unavailable. Pt off unit for vascular US.  Emmit Alexanders [facility] 06/11/2018, 1:03 PM

## 2018-06-11 NOTE — Telephone Encounter (Signed)
sch appt spk to pt daughter 06/30/18 3pm p/o MD

## 2018-06-11 NOTE — Progress Notes (Addendum)
Subjective: Mr Raczka was seen resting in his bed this morning. He underwent L fem-pop bypass yesterday by vascular surgery. He states he feels okay today. He denies any further chest pain. He statyes his pain overall has remained adequately controlled at 6-7/10 on current regimen. He is to be evaluated again by orthopedics for possble TMA. Dopplerable pulses today in Bil[place] LE. He has no other questions or concerns this AM.  Objective:  Vital signs in last 24 hours: Vitals:   06/10/18 2153 06/11/18 0500 06/11/18 0541 06/11/18 0839  BP: (!) 124/58  (!) 153/73 (!) 101/57  Pulse: 61  (!) 54 (!) 55  Resp:   16   Temp:   (!) 97.5 F (36.4 C)   TempSrc:   Oral   SpO2:   97%   Weight:  182 lb 8.7 oz (82.8 kg)    Height:       Physical Exam  Constitutional: He is oriented to person, place, and time. He appears well-developed and well-nourished. No distress.  HENT:  Head: Normocephalic and atraumatic.  Eyes: EOM are normal. Right eye exhibits no discharge. Left eye exhibits no discharge.  Cardiovascular: Normal rate, regular rhythm and intact distal pulses.  Systolic Murmur  Pulmonary/Chest: Effort normal and breath sounds normal. No respiratory distress.  Abdominal: Soft. Bowel sounds are normal. He exhibits no distension. There is no tenderness.  Musculoskeletal: He exhibits no edema.  Worsening necrosis of 3rd 4th and 5th digits of LLE  Neurological: He is alert and oriented to person, place, and time.  Skin: Skin is warm and dry.   Assessment/Plan:  Active Problems:   Atherosclerosis of native arteries of extremities with gangrene, left leg (HCC)   Necrotic toes (HCC)   PAD (peripheral artery disease) (HCC)   Preoperative cardiovascular examination   S/P CABG (coronary artery bypass graft)   Severe protein-calorie malnutrition ([place])   Uncontrolled type 2 diabetes mellitus with polyneuropathy (HCC)  Cellulitis Atherosclerosis of native arteries of extremities with gangrene,  left leg: Recent angiography in the setting of LLE ulcer showed occlusions. 2-3 days after admission he and family noticed significant worsening of the ulcers and worsening pain. Wound has been necrotic on exam with foul odor, spreading to other toes this admission. Surrounding skin erythematous and edematous consistent with surronding cellulitis, this has been improving. Vascular surgery performed successful LLE Fem-Pop bypass on 6/27, ATA and PTA pulses detected by doppler on 6/28. Patient to be reevaluated by ortho for TMA. > Cardiology has classified patient as High Risk > Blood Cultures: No Growth in 4 Days - Appreciate Vascular Surgery; Orthopedics; and Cardiology recommendations - Echocardiogram:  EF 55-60%; G2DD - Oxycodone 5mg  q4h - Dilaudid 1mg  q3h PRN - Continue Zosyn - Heparin Sub-q - AM CBC and BMP  Chest Pain: Resolved. Patient described intermittent sharp chest pain last only a second without associated symptoms overnight. No signs of ischemia on EKG. > No further chest pain - Continue to monitor  Anemia: Hgb stable at 10.8 this morning > Hgb stable at 8.1 this AM - Will continue to monitor  DM: On Lantus 40U BID and Novolog 5-15U TID at home - CBGs 100s this am - Lantus 25U BID - SSI-M  UTI: UA with large Leukocytes and many bacteria. > Ur Culture: E. Coli > 100,000 - Patient covered with zosyn  Hypokalemia: K now 4.5 on AM Labs. Will continue to monitor - AM BMP  HTN: On amlodipine 5mg  Daily at home. - Reduced to 2.5mg   Daily as patient starting on BB  CHF (EF 60-65%): > Weight up 3lbs this admission - Lasix 40mg  Daily - Started on Metoprolol 12.5 BID for perioperative risk reduction, dose held this morning due to HR in the 50s   CAD (s/p CABG): Continue home ASA and Atrovastatin - Started on Metoprolol 12.5 BID for perioperative risk reduction   - Daily ASA  CKD III: Stable, Cr at baseline. A-fib: Sinus rhythm here. Not on any anticoagulation at  home. HLD: Continue home Atrovastatin Hypothyroidism: Continue home Synthroid 29mcg Daily  FEN: HH, CM VTE ppx: Heparin Code Status: FULL  Dispo: Anticipated discharge pending surgical workup.   Neva Seat, MD 06/11/2018, 8:41 AM Pager: [phone]

## 2018-06-11 NOTE — Progress Notes (Signed)
  Progress Note    06/11/2018 7:47 AM 1 Day Post-Op  Subjective:  Soreness to L popliteal incision   Vitals:   06/10/18 2153 06/11/18 0541  BP: (!) 124/58 (!) 153/73  Pulse: 61 (!) 54  Resp:  16  Temp:  (!) 97.5 F (36.4 C)  SpO2:  97%   Physical Exam: Cardiac: irregular Lungs:  Non labored on  Incisions:  L groin incision c/d/i; popliteal incision mild firmness and tenderness to touch Extremities:  Edematous LLE to level of the mid shin; L ATA and PTA by doppler Abdomen:  Soft non tender Neurologic: A&O  CBC    Component Value Date/Time   WBC 10.5 06/11/2018 0539   RBC 3.70 (L) 06/11/2018 0539   HGB 10.8 (L) 06/11/2018 0539   HCT 33.5 (L) 06/11/2018 0539   PLT 185 06/11/2018 0539   MCV 90.5 06/11/2018 0539   MCH 29.2 06/11/2018 0539   MCHC 32.2 06/11/2018 0539   RDW 14.5 06/11/2018 0539   LYMPHSABS 1.3 06/07/2018 1257   MONOABS 1.4 (H) 06/07/2018 1257   EOSABS 0.2 06/07/2018 1257   BASOSABS 0.0 06/07/2018 1257    BMET    Component Value Date/Time   NA 137 06/11/2018 0539   K 4.5 06/11/2018 0539   CL 103 06/11/2018 0539   CO2 23 06/11/2018 0539   GLUCOSE 300 (H) 06/11/2018 0539   BUN 33 (H) 06/11/2018 0539   CREATININE 2.18 (H) 06/11/2018 0539   CALCIUM 8.4 (L) 06/11/2018 0539   GFRNONAA 27 (L) 06/11/2018 0539   GFRAA 31 (L) 06/11/2018 0539    INR    Component Value Date/Time   INR 1.37 06/08/2018 0049     Intake/Output Summary (Last 24 hours) at 06/11/2018 0747 Last data filed at 06/11/2018 4970 Gross per 24 hour  Intake 2561.33 ml  Output 1330 ml  Net 1231.33 ml     Assessment/Plan:  83 y.o. male is s/p L femoral to BK popliteal bypass with PTFE 1 Day Post-Op   Patent bypass with dopplerable ATA and PTA D/c foley Mild firmness L popliteal incision; continue to monitor Management of L foot gangrene per Dr. Etter Sjogren, PA-C [facility] [phone] 06/11/2018 7:47 AM

## 2018-06-11 NOTE — Progress Notes (Signed)
No apparently perioperative cardiac complications - dopplerable ATA and PTA pulses. Hopefully this will allow healing of lower level amputation. Continue current cardiac medications. Would recommend follow-up with cardiology in [place] after discharge. He has an appointment with Bernerd Pho, PA-C on 7/3 already scheduled.  No further suggestions at this time. Cardiology will sign-off. Call with questions.  Pixie Casino, MD, [facility], [place] Director of the Advanced Lipid Disorders &  Cardiovascular Risk Reduction Clinic Diplomate of the American Board of Clinical Lipidology Attending Cardiologist  Direct Dial: [phone]  Fax: [phone]  Website:  [email or web address]

## 2018-06-11 NOTE — Progress Notes (Addendum)
Nutrition Consult/Follow Up  DOCUMENTATION CODES:   Not applicable  INTERVENTION:    Glucerna Shake po BID, each supplement provides 220 kcal and 10 grams of protein   Juven BID, each packet provides 80 calories, 8 grams of carbohydrate, and 14 grams of amino acids; supplement contains CaHMB, glutamine, and arginine, to promote wound healing  NUTRITION DIAGNOSIS:   Increased nutrient needs related to wound healing as evidenced by estimated needs, ongoing  GOAL:   Patient will meet greater than or equal to 90% of their needs, progressing  MONITOR:   PO intake, Supplement acceptance, Labs, Weight trends, Skin, I & O's  ASSESSMENT:   [name] is an 83 yo M with Hx of PAD, CHF (EF 60-65%), CKD III, A-fib, CAD (s/p CABG), DM, HTN, HLD, Hypothyroidism, and Anemia who presented with pain and necrosis of his distal left lower extremity.    RD re-consulted for nutrition assessment. Initial assessment completed 6/25.  Pt s/p procedure 6/27: COMMON FEMORAL TO BELOW KNEE POPLITEAL BYPASS GRAFT (Left Leg Lower)  Pt working with therapy at time of RD visit. Spoke with RN. Pt taking Juven supplements in his beverages. PO intake at meals has been 75% per flowsheet records.  Pt is also receiving Glucerna Shakes. RN to offer later today. He is to be evaluated for possible transmetatarsal amputation (TMA). Labs and medications reviewed.  CBG's [phone].  Diet Order:   Diet Order           Diet renal/carb modified with fluid restriction Diet-HS Snack? Nothing; Fluid restriction: 1200 mL Fluid; Room service appropriate? Yes; Fluid consistency: Thin  Diet effective now         EDUCATION NEEDS:   Education needs have been addressed  Skin:  Skin Assessment: Skin Integrity Issues: Skin Integrity Issues:: Incisions Incisions: rt groin; 3rd, 4th, and 5th lt necrotic toes with surrounding cellulits  Last BM:  6/26  Height:   Ht Readings from Last 1 Encounters:  06/10/18 5\' 5"   (1.651 m)   Weight:   Wt Readings from Last 1 Encounters:  06/11/18 182 lb 8.7 oz (82.8 kg)   Ideal Body Weight:  61.8 kg  BMI:  Body mass index is 30.38 kg/m.  Estimated Nutritional Needs:   Kcal:  1850-2050  Protein:  90-105 grams  Fluid:  > 1.8 L  Arthur Holms, RD, LDN Pager #: [phone] After-Hours Pager #: [phone]

## 2018-06-11 NOTE — Progress Notes (Signed)
ABI's have been completed. Right 0.75 Left Noncompressible  06/11/18 1:51 PM [name] RVT

## 2018-06-12 LAB — CBC
HCT: 29.8 % — ABNORMAL LOW (ref 39.0–52.0)
Hemoglobin: 9.4 g/dL — ABNORMAL LOW (ref 13.0–17.0)
MCH: 28.9 pg (ref 26.0–34.0)
MCHC: 31.5 g/dL (ref 30.0–36.0)
MCV: 91.7 fL (ref 78.0–100.0)
PLATELETS: 187 10*3/uL (ref 150–400)
RBC: 3.25 MIL/uL — ABNORMAL LOW (ref 4.22–5.81)
RDW: 14.2 % (ref 11.5–15.5)
WBC: 9.4 10*3/uL (ref 4.0–10.5)

## 2018-06-12 LAB — GLUCOSE, CAPILLARY
GLUCOSE-CAPILLARY: 146 mg/dL — AB (ref 70–99)
Glucose-Capillary: 235 mg/dL — ABNORMAL HIGH (ref 70–99)
Glucose-Capillary: 146 mg/dL — ABNORMAL HIGH (ref 70–99)
Glucose-Capillary: 125 mg/dL — ABNORMAL HIGH (ref 70–99)

## 2018-06-12 LAB — BASIC METABOLIC PANEL
Anion gap: 8 (ref 5–15)
BUN: 44 mg/dL — AB (ref 8–23)
CALCIUM: 8.3 mg/dL — AB (ref 8.9–10.3)
CO2: 24 mmol/L (ref 22–32)
CREATININE: 2.26 mg/dL — AB (ref 0.61–1.24)
Chloride: 104 mmol/L (ref 98–111)
GFR calc non Af Amer: 26 mL/min — ABNORMAL LOW (ref >60)
GFR, EST AFRICAN AMERICAN: 30 mL/min — AB (ref >60)
Glucose, Bld: 256 mg/dL — ABNORMAL HIGH (ref 70–99)
Potassium: 3.9 mmol/L (ref 3.5–5.1)
SODIUM: 136 mmol/L (ref 135–145)

## 2018-06-12 LAB — CULTURE, BLOOD (ROUTINE X 2)
CULTURE: NO GROWTH
Culture: NO GROWTH
SPECIAL REQUESTS: ADEQUATE
Special Requests: ADEQUATE

## 2018-06-12 MED ORDER — INSULIN ASPART 100 UNIT/ML ~~LOC~~ SOLN
4.0000 [IU] | Freq: Three times a day (TID) | SUBCUTANEOUS | Status: DC
  Administered 2018-06-12 – 2018-06-18 (×5): 4 [IU] via SUBCUTANEOUS

## 2018-06-12 NOTE — Progress Notes (Signed)
     Subjective: 2 Days Post-Op Procedure(s) (LRB): LEFT COMMON FEMORAL TO BELOW KNEE POPLITEAL BYPASS GRAFT WITH PROPATEN (Left) Left femoral artery to popliteal artery bypass,2 days post procedure,primary care requests follow up.  Patient reports pain as moderate.    Objective:   VITALS:  Temp:  [97.7 F (36.5 C)-98 F (36.7 C)] 98 F (36.7 C) (06/29 1140) Pulse Rate:  [62-74] 74 (06/29 1140) Resp:  [17-20] 19 (06/29 1140) BP: (110-145)/(58-61) 139/61 (06/29 1140) SpO2:  [97 %-98 %] 98 % (06/29 1140) Weight:  [183 lb 10.3 oz (83.3 kg)] 183 lb 10.3 oz (83.3 kg) (06/29 0339)  ABD soft Neurovascular intact Sensation intact distally Compartment soft Left foot is warm except for lateral 3 toes that are mumified with dry gangrene. No sign of infection. Demarcating left lateral forefoot zone of distal dysvascular necrosis   LABS Recent Labs    06/10/18 0420 06/10/18 2033 06/11/18 0539 06/12/18 0318  HGB 8.1* 11.3* 10.8* 9.4*  WBC 8.9 14.0* 10.5 9.4  PLT 232 219 185 187   Recent Labs    06/11/18 0539 06/12/18 0318  NA 137 136  K 4.5 3.9  CL 103 104  CO2 23 24  BUN 33* 44*  CREATININE 2.18* 2.26*  GLUCOSE 300* 256*   No results for input(s): LABPT, INR in the last 72 hours.   Assessment/Plan: 2 Days Post-Op Procedure(s) (LRB): LEFT COMMON FEMORAL TO BELOW KNEE POPLITEAL BYPASS GRAFT WITH PROPATEN (Left)  Advance diet Up with therapy  Dr. Sharol Given will see and likely he will need a transmetatarsal  Amputation vs. BKA.   Basil Dess 06/12/2018, 3:20 PMPatient ID: [name], male   DOB: 09/28/1935, 84 y.o.   MRN: [id number]

## 2018-06-12 NOTE — Plan of Care (Signed)
  Problem: Activity: Goal: Ability to tolerate increased activity will improve Outcome: Progressing   Problem: Clinical Measurements: Goal: Signs and symptoms of graft occlusion will improve Outcome: Progressing   Problem: Pain Managment: Goal: General experience of comfort will improve Outcome: Progressing

## 2018-06-12 NOTE — Clinical Social Work Note (Signed)
Clinical Social Work Assessment  Patient Details  Name: [name] MRN: [id number] Date of Birth: 1935/12/06  Date of referral:  06/12/18               Reason for consult:  Facility Placement, Discharge Planning                Permission sought to share information with:  Family Supports Permission granted to share information::  Yes, Verbal Permission Granted  Name::     [employer]  Agency::  snf  Relationship::  son  Contact Information:  [phone]  Housing/Transportation Living arrangements for the past 2 months:  [place] of Information:  Patient Patient Interpreter Needed:  None Criminal Activity/Legal Involvement Pertinent to Current Situation/Hospitalization:  No - Comment as needed Significant Relationships:  Adult Children Lives with:  Self Do you feel safe going back to the place where you live?  Yes Need for family participation in patient care:  Yes (Comment)  Care giving concerns:  No family at bedside. Patient stated he lives by himself in [place] but has a lot of support from adult children. Patient stated he is waiting for children to come and visit him.   Social Worker assessment / plan:  CSW met patient at bedside to discuss discharge plan and to offer support during his hospitalization stay. Patient stated he will be willing to discharge to a short term rehab in [place]. Patient stated he is eager to get back on his feet and progress with his treatment. CSW is awaiting orthopedics consult to see if patient will need an amputation. Patient is aware that he may have to have an amputation and he seemed to be content with plan.  Employment status:  Retired Nurse, adult PT Recommendations:  [place] / Referral to community resources:  [place]  Patient/Family's Response to care: Patient very pleasant during assessment and agreeable with discharge plan. Patient awaiting MD  consult to see if amputation will be needed   Patient/Family's Understanding of and Emotional Response to Diagnosis, Current Treatment, and Prognosis:  CSW will continue to follow for support   Emotional Assessment Appearance:  Appears stated age Attitude/Demeanor/Rapport:  Engaged Affect (typically observed):  Accepting Orientation:  Oriented to Self, Oriented to Place, Oriented to  Time, Oriented to Situation Alcohol / Substance use:  Not Applicable Psych involvement (Current and /or in the community):  No (Comment)  Discharge Needs  Concerns to be addressed:  Care Coordination Readmission within the last 30 days:  No Current discharge risk:  Legal Concerns Barriers to Discharge:  Continued Medical Work up   [employer], LCSW 06/12/2018, 10:36 AM

## 2018-06-12 NOTE — Plan of Care (Signed)
Care plans reviewed and patient is progressing.  

## 2018-06-12 NOTE — NC FL2 (Signed)
[place] MEDICAID FL2 LEVEL OF CARE SCREENING TOOL     IDENTIFICATION  Patient Name: [name] Birthdate: [birth year] Sex: male Admission Date (Current Location): 06/07/2018  [facility] and [place] Number:  Herbalist and Address:  [facility], [place] [address], [place], [place] [address]      Provider Number: [id number]  Attending Physician Name and Address:  Aldine Contes, MD  Relative Name and Phone Number:  [facility], [phone]    Current Level of Care: Hospital Recommended Level of Care: [place] Prior Approval Number:    Date Approved/Denied:   PASRR Number: [id number]  Discharge Plan: SNF    Current Diagnoses: Patient Active Problem List   Diagnosis Date Noted  . Severe protein-calorie malnutrition ([place])   . Uncontrolled type 2 diabetes mellitus with polyneuropathy ([place])   . Necrotic toes ([place])   . PAD (peripheral artery disease) ([employer])   . Preoperative cardiovascular examination   . S/P CABG (coronary artery bypass graft)   . Atherosclerosis of native arteries of extremities with gangrene, left leg ([place]) 06/07/2018  . Atherosclerosis of native arteries of the extremities with ulceration ([place]) 05/26/2018  . Acute respiratory failure with hypoxia ([place])   . Chronic ulcer of left foot ([place]) 01/08/2018  . Hypothyroidism 01/08/2018  . Cellulitis 12/24/2017  . Nausea and vomiting 12/23/2017  . DM type 2 causing vascular disease ([place]) 05/07/2016  . Essential hypertension, benign 05/07/2016  . Other specified hypothyroidism 05/07/2016  . Bilateral carotid bruits 03/11/2016  . Elevated troponin   . Acute on chronic diastolic CHF (congestive heart failure) ([place])   . Bacteremia 02/24/2016  . Sepsis ([place]) 02/22/2016  . Uncontrolled type 2 diabetes mellitus with stage 4 chronic kidney disease ([place]) 02/22/2016  . Demand ischemia ([place]) 02/22/2016  . UTI (urinary tract infection) 02/22/2016  . Acute renal  failure superimposed on stage 3 chronic kidney disease ([place])   . Pyrexia   . Arterial hypotension   . Hypoxia   . UTI (lower urinary tract infection)   . OBESITY, UNSPECIFIED 08/24/2009  . Hyperlipidemia 08/15/2009  . Essential hypertension 08/15/2009  . CAD, ARTERY BYPASS GRAFT 08/15/2009  . Atrial fibrillation ([place]) 08/15/2009    Orientation RESPIRATION BLADDER Height & Weight     Self, Time, Situation, Place  O2(2L) Continent Weight: 183 lb 10.3 oz (83.3 kg) Height:  5\' 5"  (165.1 cm)  BEHAVIORAL SYMPTOMS/MOOD NEUROLOGICAL BOWEL NUTRITION STATUS      Continent Diet(renal carb modified )  AMBULATORY STATUS COMMUNICATION OF NEEDS Skin   Limited Assist Verbally                         Personal Care Assistance Level of Assistance  Bathing, Feeding, Dressing Bathing Assistance: Limited assistance Feeding assistance: Independent Dressing Assistance: Limited assistance     Functional Limitations Info  Sight, Hearing, Speech Sight Info: Adequate Hearing Info: Adequate Speech Info: Adequate    SPECIAL CARE FACTORS FREQUENCY  PT (By licensed PT), OT (By licensed OT)     PT Frequency: 5x wk OT Frequency: 5x wk            Contractures Contractures Info: Not present    Additional Factors Info  Code Status, Allergies, Insulin Sliding Scale Code Status Info: Full code  Allergies Info: No Know Allergies   Insulin Sliding Scale Info: novolog       Current Medications (06/12/2018):  This is the current hospital active medication list  Current Facility-Administered Medications  Medication Dose Route Frequency Provider Last Rate Last Dose  . 0.9 %  sodium chloride infusion (Manually program via Guardrails IV Fluids)   Intravenous Once Rhyne, Samantha J, PA-C      . acetaminophen (TYLENOL) tablet 650 mg  650 mg Oral Q6H PRN Rhyne, Samantha J, PA-C       Or  . acetaminophen (TYLENOL) suppository 650 mg  650 mg Rectal Q6H PRN Rhyne, Samantha J, PA-C      . amLODipine  (NORVASC) tablet 2.5 mg  2.5 mg Oral Daily Rhyne, Samantha J, PA-C   2.5 mg at 06/11/18 0839  . aspirin chewable tablet 81 mg  81 mg Oral QHS Gabriel Earing, PA-C   81 mg at 06/11/18 2125  . atorvastatin (LIPITOR) tablet 40 mg  40 mg Oral QHS Rhyne, Samantha J, PA-C   40 mg at 06/11/18 2125  . feeding supplement (GLUCERNA SHAKE) (GLUCERNA SHAKE) liquid 237 mL  237 mL Oral BID BM Rhyne, Samantha J, PA-C   237 mL at 06/11/18 1725  . furosemide (LASIX) tablet 40 mg  40 mg Oral BID Rhyne, Hulen Shouts, PA-C   40 mg at 06/12/18 0904  . gi cocktail (Maalox,Lidocaine,Donnatal)  30 mL Oral Daily PRN Gabriel Earing, PA-C   30 mL at 06/10/18 0105  . heparin injection 5,000 Units  5,000 Units Subcutaneous Q8H Rhyne, Samantha J, PA-C   5,000 Units at 06/12/18 0641  . HYDROmorphone (DILAUDID) injection 0.5 mg  0.5 mg Intravenous Q3H PRN Rhyne, Samantha J, PA-C   0.5 mg at 06/12/18 0214  . insulin aspart (novoLOG) injection 0-15 Units  0-15 Units Subcutaneous TID WC Rhyne, Samantha J, PA-C   5 Units at 06/12/18 [phone]  . insulin glargine (LANTUS) injection 25 Units  25 Units Subcutaneous BID Neva Seat, MD   25 Units at 06/11/18 2125  . levothyroxine (SYNTHROID, LEVOTHROID) tablet 50 mcg  50 mcg Oral QAC breakfast Gabriel Earing, PA-C   50 mcg at 06/12/18 6578  . metoprolol tartrate (LOPRESSOR) tablet 12.5 mg  12.5 mg Oral BID Rhyne, Samantha J, PA-C   12.5 mg at 06/11/18 2125  . nutrition supplement (JUVEN) (JUVEN) powder packet 1 packet  1 packet Oral BID BM Rhyne, Samantha J, PA-C   1 packet at 06/11/18 1541  . ondansetron (ZOFRAN) tablet 4 mg  4 mg Oral Q6H PRN Rhyne, Samantha J, PA-C       Or  . ondansetron (ZOFRAN) injection 4 mg  4 mg Intravenous Q6H PRN Rhyne, Samantha J, PA-C      . oxyCODONE (Oxy IR/ROXICODONE) immediate release tablet 5 mg  5 mg Oral Q4H Rhyne, Samantha J, PA-C   5 mg at 06/12/18 0641  . pantoprazole (PROTONIX) EC tablet 40 mg  40 mg Oral Daily Rhyne, Hulen Shouts, PA-C   40  mg at 06/11/18 0839  . phenol (CHLORASEPTIC) mouth spray 1 spray  1 spray Mouth/Throat PRN Rhyne, Samantha J, PA-C      . piperacillin-tazobactam (ZOSYN) IVPB 3.375 g  3.375 g Intravenous Q8H Rhyne, Samantha J, PA-C 12.5 mL/hr at 06/12/18 0646 3.375 g at 06/12/18 0646  . polyethylene glycol (MIRALAX / GLYCOLAX) packet 17 g  17 g Oral Daily PRN Gabriel Earing, PA-C         Discharge Medications: Please see discharge summary for a list of discharge medications.  Relevant Imaging Results:  Relevant Lab Results:   Additional Information SS# [id number] possibly amputation pending ortho consult (on  iv zosyn)  Wende Neighbors, LCSW

## 2018-06-12 NOTE — Progress Notes (Signed)
Internal Medicine Attending:   I saw and examined the patient. I reviewed the resident's note and I agree with the resident's findings and plan as documented in the resident's note.  Patient feels well today with no new complaints.  He still has persistent pain in his left foot but feels better overall.  Patient was initially admitted with worsening left foot pain with associated gangrene in his left third fourth and fifth toes.  Patient is status post left femoropopliteal bypass with vascular surgery. Patient is awaiting orthopedic follow-up.  Ortho will decide if patient is a candidate for transmetatarsal amputation versus an AKA/BKA.  Continue with IV Zosyn for now.  Continue pain control.  No further work-up at this time.  We will continue to monitor him closely.

## 2018-06-12 NOTE — Progress Notes (Signed)
    Subjective  - POD #2  C/o mild pain   Physical Exam:  Incisions clean and dry Dry gangrene to left toes Doppler signal in left foot       Assessment/Plan:  POD #2  For amputation by Dr. Sharol Given next week Mob[place]ze SQ heparin for DVT prophylaxis IV abx  [place] Treyshawn Muldrew 06/12/2018 2:20 PM --  Vitals:   06/12/18 0700 06/12/18 1140  BP:  139/61  Pulse:  74  Resp: 20 19  Temp:  98 F (36.7 C)  SpO2:  98%    Intake/Output Summary (Last 24 hours) at 06/12/2018 1420 Last data filed at 06/12/2018 1400 Gross per 24 hour  Intake 1732.29 ml  Output 1370 ml  Net 362.29 ml     Laboratory CBC    Component Value Date/Time   WBC 9.4 06/12/2018 0318   HGB 9.4 (L) 06/12/2018 0318   HCT 29.8 (L) 06/12/2018 0318   PLT 187 06/12/2018 0318    BMET    Component Value Date/Time   NA 136 06/12/2018 0318   K 3.9 06/12/2018 0318   CL 104 06/12/2018 0318   CO2 24 06/12/2018 0318   GLUCOSE 256 (H) 06/12/2018 0318   BUN 44 (H) 06/12/2018 0318   CREATININE 2.26 (H) 06/12/2018 0318   CALCIUM 8.3 (L) 06/12/2018 0318   GFRNONAA 26 (L) 06/12/2018 0318   GFRAA 30 (L) 06/12/2018 0318    COAG Lab Results  Component Value Date   INR 1.37 06/08/2018   INR 1.32 06/07/2018   INR 1.11 12/24/2017   No results found for: PTT  Antibiotics Anti-infectives (From admission, onward)   Start     Dose/Rate Route Frequency Ordered Stop   06/10/18 1400  piperacillin-tazobactam (ZOSYN) IVPB 3.375 g  Status:  Discontinued     3.375 g 100 mL/hr over 30 Minutes Intravenous To Surgery 06/10/18 1400 06/10/18 1814   06/07/18 2300  piperacillin-tazobactam (ZOSYN) IVPB 3.375 g     3.375 g 12.5 mL/hr over 240 Minutes Intravenous Every 8 hours 06/07/18 1755     06/07/18 1615  piperacillin-tazobactam (ZOSYN) IVPB 3.375 g     3.375 g 100 mL/hr over 30 Minutes Intravenous  Once 06/07/18 1607 06/07/18 1721       V. Leia Alf, M.D. [facility] Office:  [phone] Pager:  [phone]

## 2018-06-12 NOTE — Progress Notes (Addendum)
Subjective: [name] was seen resting in his bed this morning. He states his pain remains adequately controlled at a 6/10 wit current regimen. He state she is overall still feeling okay and he was able to get up and walk a short distance with PT yesterday. He has no other questions or concerns this AM.  Objective:  Vital signs in last 24 hours: Vitals:   06/11/18 0839 06/11/18 1413 06/11/18 2005 06/12/18 0339  BP: (!) 101/57 115/71 (!) 110/58 (!) 145/58  Pulse: (!) 55 (!) 103 70 62  Resp:  19 19 17   Temp:  97.7 F (36.5 C) 97.9 F (36.6 C) 97.7 F (36.5 C)  TempSrc:  Oral Oral Oral  SpO2:  97% 97% 98%  Weight:    183 lb 10.3 oz (83.3 kg)  Height:       Physical Exam  Constitutional: He is oriented to person, place, and time. He appears well-developed and well-nourished. No distress.  HENT:  Head: Normocephalic and atraumatic.  Eyes: EOM are normal. Right eye exhibits no discharge. Left eye exhibits no discharge.  Cardiovascular: Normal rate, regular rhythm and intact distal pulses.  Systolic Murmur  Pulmonary/Chest: Effort normal and breath sounds normal. No respiratory distress.  Abdominal: Soft. Bowel sounds are normal. He exhibits no distension. There is no tenderness.  Musculoskeletal: He exhibits edema.  Worsening necrosis of 3rd 4th and 5th digits of LLE Trace bilateral LE edema L>R  Neurological: He is alert and oriented to person, place, and time.  Skin: Skin is warm and dry.   Assessment/Plan:  Active Problems:   Atherosclerosis of native arteries of extremities with gangrene, left leg (HCC)   Necrotic toes (HCC)   PAD (peripheral artery disease) (HCC)   Preoperative cardiovascular examination   S/P CABG (coronary artery bypass graft)   Severe protein-calorie malnutrition ([place])   Uncontrolled type 2 diabetes mellitus with polyneuropathy (HCC)  Cellulitis Atherosclerosis of native arteries of extremities with gangrene, left leg: Recent angiography in the  setting of LLE ulcer showed occlusions. 2-3 days after admission he and family noticed significant worsening of the ulcers and worsening pain. Wound has been necrotic on exam with foul odor, spreading to other toes this admission. Surrounding skin erythematous and edematous consistent with surronding cellulitis, this has been improving. Vascular surgery performed successful LLE Fem-Pop bypass on 6/27, ATA and PTA pulses detected by doppler on 6/28. Patient to be reevaluated by ortho for TMA, surgery likely in next few days > Cardiology has classified patient as High Risk > Blood Cultures: No Growth, Final - Appreciate Vascular Surgery; Orthopedics; and Cardiology recommendations - Echocardiogram:  EF 55-60%; G2DD - Oxycodone 5mg  q4h - Dilaudid 1mg  q3h PRN - Continue Zosyn - Heparin Sub-q - AM CBC and BMP  DM: On Lantus 40U BID and Novolog 5-15U TID at home - CBGs 100s this am - Lantus 25U BID - Novolog 4U TID AC - SSI-M  Anemia: Hgb 9.4 this am from 10.8, suspect dilutional du to ongoing IVF - Will continue to monitor - Discontinue IVF  HTN: On amlodipine 5mg  Daily at home. - Reduced to 2.5mg  Daily as patient starting on BB  CHF (EF 60-65%): > Weight up 3lbs this admission - Lasix 40mg  Daily - Started on Metoprolol 12.5 BID for perioperative risk reduction, dose held this morning due to HR in the 50s   CAD (s/p CABG): Continue home ASA and Atrovastatin - Started on Metoprolol 12.5 BID for perioperative risk reduction   - Daily ASA  UTI: UA with large Leukocytes and many bacteria. > Ur Culture: E. Coli > 100,000 - Patient covered with zosyn, completed adequate course for this  Chest Pain: Resolved. Patient described intermittent sharp chest pain last only a second without associated symptoms overnight 6/27. No signs of ischemia on EKG. > No further chest pain - Continue to monitor  Hypokalemia: K now 4.5 on AM Labs. Will continue to monitor - AM BMP  CKD III: Stable, Cr at  baseline. A-fib: Sinus rhythm here. Not on any anticoagulation at home. HLD: Continue home Atrovastatin Hypothyroidism: Continue home Synthroid 54mcg Daily  FEN: HH, CM VTE ppx: Heparin Code Status: FULL  Dispo: Anticipated discharge pending surgical workup.   Neva Seat, MD 06/12/2018, 11:38 AM Pager: [phone]

## 2018-06-13 LAB — BASIC METABOLIC PANEL
Anion gap: 6 (ref 5–15)
BUN: 49 mg/dL — ABNORMAL HIGH (ref 8–23)
CALCIUM: 8.3 mg/dL — AB (ref 8.9–10.3)
CO2: 28 mmol/L (ref 22–32)
CREATININE: 2.45 mg/dL — AB (ref 0.61–1.24)
Chloride: 104 mmol/L (ref 98–111)
GFR calc Af Amer: 27 mL/min — ABNORMAL LOW (ref >60)
GFR calc non Af Amer: 23 mL/min — ABNORMAL LOW (ref >60)
Glucose, Bld: 102 mg/dL — ABNORMAL HIGH (ref 70–99)
Potassium: 3.3 mmol/L — ABNORMAL LOW (ref 3.5–5.1)
SODIUM: 138 mmol/L (ref 135–145)

## 2018-06-13 LAB — CBC
HCT: 30.8 % — ABNORMAL LOW (ref 39.0–52.0)
Hemoglobin: 9.7 g/dL — ABNORMAL LOW (ref 13.0–17.0)
MCH: 29 pg (ref 26.0–34.0)
MCHC: 31.5 g/dL (ref 30.0–36.0)
MCV: 92.2 fL (ref 78.0–100.0)
Platelets: 208 10*3/uL (ref 150–400)
RBC: 3.34 MIL/uL — ABNORMAL LOW (ref 4.22–5.81)
RDW: 14.1 % (ref 11.5–15.5)
WBC: 9.8 10*3/uL (ref 4.0–10.5)

## 2018-06-13 LAB — GLUCOSE, CAPILLARY
GLUCOSE-CAPILLARY: 74 mg/dL (ref 70–99)
Glucose-Capillary: 96 mg/dL (ref 70–99)
Glucose-Capillary: 121 mg/dL — ABNORMAL HIGH (ref 70–99)
Glucose-Capillary: 138 mg/dL — ABNORMAL HIGH (ref 70–99)
Glucose-Capillary: 155 mg/dL — ABNORMAL HIGH (ref 70–99)

## 2018-06-13 MED ORDER — FUROSEMIDE 40 MG PO TABS
40.0000 mg | ORAL_TABLET | Freq: Two times a day (BID) | ORAL | Status: DC
  Administered 2018-06-14 – 2018-06-15 (×3): 40 mg via ORAL
  Filled 2018-06-13 (×3): qty 1

## 2018-06-13 MED ORDER — POLYETHYLENE GLYCOL 3350 17 G PO PACK
17.0000 g | PACK | Freq: Every day | ORAL | Status: DC
  Administered 2018-06-13 – 2018-06-17 (×4): 17 g via ORAL
  Filled 2018-06-13 (×6): qty 1

## 2018-06-13 MED ORDER — SENNA 8.6 MG PO TABS
1.0000 | ORAL_TABLET | Freq: Two times a day (BID) | ORAL | Status: DC
  Administered 2018-06-13 – 2018-06-17 (×8): 8.6 mg via ORAL
  Filled 2018-06-13 (×11): qty 1

## 2018-06-13 MED ORDER — POTASSIUM CHLORIDE CRYS ER 20 MEQ PO TBCR
40.0000 meq | EXTENDED_RELEASE_TABLET | Freq: Once | ORAL | Status: AC
  Administered 2018-06-13: 40 meq via ORAL
  Filled 2018-06-13: qty 2

## 2018-06-13 MED ORDER — OXYCODONE HCL 5 MG PO TABS
5.0000 mg | ORAL_TABLET | Freq: Four times a day (QID) | ORAL | Status: DC | PRN
  Administered 2018-06-14 – 2018-06-18 (×13): 5 mg via ORAL
  Filled 2018-06-13 (×14): qty 1

## 2018-06-13 MED ORDER — OXYCODONE HCL 5 MG PO TABS
5.0000 mg | ORAL_TABLET | ORAL | Status: DC | PRN
  Administered 2018-06-13 – 2018-06-18 (×10): 5 mg via ORAL
  Filled 2018-06-13 (×9): qty 1

## 2018-06-13 MED ORDER — OXYCODONE-ACETAMINOPHEN 5-325 MG PO TABS
1.0000 | ORAL_TABLET | Freq: Four times a day (QID) | ORAL | Status: DC | PRN
  Administered 2018-06-13 – 2018-06-18 (×15): 1 via ORAL
  Filled 2018-06-13 (×15): qty 1

## 2018-06-13 NOTE — Progress Notes (Addendum)
Subjective:  Patient evaluated at bedside; he states he is still having significant pain that current regimen is not appropriately covering; he gets the most relief with dilaudid however this lasts only 30 mins; he states at home he is on Percocet 10-325, however here has been receiving oxy ir 5 q4hr scheduled which he does not think is helping. He endorses constipation, last BM 3 days ago. He denies chest pain, shortness of breath, abd pain, N/V, fevers, chills.  Objective:  Vital signs in last 24 hours: Vitals:   06/12/18 1926 06/12/18 2200 06/13/18 0326 06/13/18 0416  BP: (!) 108/55   115/67  Pulse: (!) 58 65  (!) 58  Resp: (!) 21 18  20   Temp: 98.4 F (36.9 C)   98.7 F (37.1 C)  TempSrc: Oral   Oral  SpO2: 97% 99%  91%  Weight:   177 lb 4 oz (80.4 kg)   Height:      Assessment/Plan:  PE: Constitutional: NAD CV: RRR Resp: CTAB on anterior lung fields, no increased work of breathing Abd: soft, NDNT, +BS Ext: DP/PT pulses not palpable, warm extremities bil; L digits 3-5 with wet gangrene; trace LLE edema  Active Problems:   Atherosclerosis of native arteries of extremities with gangrene, left leg (HCC)   Necrotic toes (HCC)   PAD (peripheral artery disease) (HCC)   Preoperative cardiovascular examination   S/P CABG (coronary artery bypass graft)   Severe protein-calorie malnutrition (HCC)   Uncontrolled type 2 diabetes mellitus with polyneuropathy (HCC)  Atherosclerosis of native arteries of extremities with gangrene, left leg:  Recent angiography in the setting of LLE ulcer showed occlusions. 2-3 days after admission he and family noticed significant worsening of the ulcers and worsening pain. Wound has been necrotic on exam with foul odor, spreading to other toes this admission. Surrounding skin erythematous and edematous consistent with surronding cellulitis, this has been improving. Vascular surgery performed successful LLE Fem-Pop bypass on 6/27, ATA and PTA pulses  detected by doppler on 6/28. Patient to be reevaluated by ortho for TMA, surgery likely in next few days. BCx is neg (final). > Cardiology has classified patient as High Risk; will need f/u rescheduled as currently 7/3 appt. - Appreciate Vascular Surgery; Orthopedics; and Cardiology recommendations - Echocardiogram:  EF 55-60%; G2DD - Oxy-apap 10-325mg  q6hr prn - Oxycodone 5mg  q4h prn for breakthrough - Dilaudid 0.5mg  q3h PRN for severe breakthrough - Continue Zosyn - Heparin sq - AM CBC and BMP  DM: On Lantus 40U BID and Novolog 5-15U TID at home - Lantus 25U BID - Novolog 4U TID AC - SSI-M  Anemia: Hgb 9.7 this am - Will continue to monitor  HTN: On amlodipine 5mg  daily at home. - Reduced to 2.5mg  daily as patient starting on BB  CHF (EF 60-65%): > Weight up 3lbs this admission - Lasix 40mg  BID; held evening dose in setting of slightly inc Cr; resume in AM as before - Started on Metoprolol 12.5 BID for perioperative risk reduction, dose held this morning due to HR in the 44s  - KDurg 33mEq for K of 3.3 this AM  CAD (s/p CABG): Continue home ASA and Atrovastatin - Started on Metoprolol 12.5 BID for perioperative risk reduction   - Daily ASA  UTI: UA with large Leukocytes and many bacteria. > Ur Culture: E. Coli > 100,000 - Patient covered with zosyn, completed adequate course for this  Chest Pain: Resolved. Patient described intermittent sharp chest pain last only a second without  associated symptoms overnight 6/27. No signs of ischemia on EKG. > No further chest pain - Continue to monitor  CKD III: Stable, Cr at baseline. A-fib: Sinus rhythm here. Not on any anticoagulation at home. HLD: Continue home Atrovastatin Hypothyroidism: Continue home Synthroid 2mcg Daily Long term opioid therapy: Per database and pt, on oxy-apap 10-325 TID at home  FEN: HH, CM VTE ppx: Heparin Code Status: FULL  Dispo: Anticipated discharge pending surgical workup and management.    Alphonzo Grieve, MD 06/13/2018, 9:31 AM Pager: [phone]

## 2018-06-13 NOTE — Progress Notes (Addendum)
  Progress Note    06/13/2018 9:53 AM 3 Days Post-Op  Subjective:  No new complaints   Vitals:   06/12/18 2200 06/13/18 0416  BP:  115/67  Pulse: 65 (!) 58  Resp: 18 20  Temp:  98.7 F (37.1 C)  SpO2: 99% 91%   Physical Exam: Lungs:  Non labored Incisions:  L popliteal incision and groin incision unremarkable Extremities:  L AT and PT by doppler; gangrene L foot Abdomen:  soft Neurologic: A&O  CBC    Component Value Date/Time   WBC 9.8 06/13/2018 0509   RBC 3.34 (L) 06/13/2018 0509   HGB 9.7 (L) 06/13/2018 0509   HCT 30.8 (L) 06/13/2018 0509   PLT 208 06/13/2018 0509   MCV 92.2 06/13/2018 0509   MCH 29.0 06/13/2018 0509   MCHC 31.5 06/13/2018 0509   RDW 14.1 06/13/2018 0509   LYMPHSABS 1.3 06/07/2018 1257   MONOABS 1.4 (H) 06/07/2018 1257   EOSABS 0.2 06/07/2018 1257   BASOSABS 0.0 06/07/2018 1257    BMET    Component Value Date/Time   NA 138 06/13/2018 0509   K 3.3 (L) 06/13/2018 0509   CL 104 06/13/2018 0509   CO2 28 06/13/2018 0509   GLUCOSE 102 (H) 06/13/2018 0509   BUN 49 (H) 06/13/2018 0509   CREATININE 2.45 (H) 06/13/2018 0509   CALCIUM 8.3 (L) 06/13/2018 0509   GFRNONAA 23 (L) 06/13/2018 0509   GFRAA 27 (L) 06/13/2018 0509    INR    Component Value Date/Time   INR 1.37 06/08/2018 0049     Intake/Output Summary (Last 24 hours) at 06/13/2018 0953 Last data filed at 06/13/2018 0800 Gross per 24 hour  Intake 445 ml  Output 2320 ml  Net -1875 ml     Assessment/Plan:  83 y.o. male is s/p L femoral to BK pop bypass with PTFE 3 Days Post-Op   Perfusing L foot with AT and PT by doppler Plans noted by Dr. Sharol Given for L TMA Wednesday OOB with assistance   Dagoberto Ligas, PA-C [facility] [phone] 06/13/2018 9:53 AM  I agree with the above and have sen and examined the patient.  His incisions are c/d/i.  Left foot is warm.  Plans for amputation of toes by Dr. Sharol Given on Wednesday  Annamarie Major

## 2018-06-13 NOTE — Evaluation (Signed)
Occupational Therapy Evaluation Patient Details Name: [name] MRN: [id number] DOB: 1935/01/19 Today's Date: 06/13/2018    History of Present Illness Pt adm with lt foot gangrene. Underwent lt femoral to below knee popliteal bypass on 06/10/18. PMH - cad, afib, dm, chf, ckd, htn, pad, cabg   Clinical Impression   Pt admitted with above. He demonstrates the below listed deficits and will benefit from continued OT to maximize safety and independence with BADLs.  Pt presents to OT with generalized weakness, decreased activity tolerance, pain, impaired balance.  He currently requires set up - min A for UB ADLs, and mod - max A for LB ADLs.  He is severely limited by pain.   PTA, he lived alone and was mod I with ADLs.  Recommend SNF level rehab at discharge/        Follow Up Recommendations  SNF;Supervision/Assistance - 24 hour    Equipment Recommendations  None recommended by OT    Recommendations for Other Services       Precautions / Restrictions Precautions Precautions: Fall      Mobility Bed Mobility               General bed mobility comments: pt on BSC > recliner   Transfers Overall transfer level: Needs assistance Equipment used: Rolling walker (2 wheeled) Transfers: Sit to/from [facility] Sit to Stand: Mod assist;+2 safety/equipment Stand pivot transfers: Max assist;+2 safety/equipment       General transfer comment: Pt requires cues for hand placement and sequencing.  Assist to move into standing and assist to steady     Balance Overall balance assessment: Needs assistance Sitting-balance support: Bilateral upper extremity supported;Feet supported Sitting balance-Leahy Scale: Poor     Standing balance support: Bilateral upper extremity supported;No upper extremity supported Standing balance-Leahy Scale: Poor Standing balance comment: bil. UE support and mod A                            ADL either performed or  assessed with clinical judgement   ADL Overall ADL's : Needs assistance/impaired Eating/Feeding: Independent   Grooming: Wash/dry hands;Wash/dry face;Oral care;Brushing hair;Set up;Sitting   Upper Body Bathing: Minimal assistance;Sitting   Lower Body Bathing: Maximal assistance;Sit to/from stand   Upper Body Dressing : Minimal assistance;Sitting   Lower Body Dressing: Total assistance;Sit to/from stand   Toilet Transfer: Moderate assistance;+2 for safety/equipment;Stand-pivot;BSC;RW   Toileting- Clothing Manipulation and Hygiene: Total assistance;Sit to/from stand       Functional mobility during ADLs: Moderate assistance;+2 for safety/equipment;Rolling walker General ADL Comments: Pt limited by severe pain Lt LE      Vision         Perception     Praxis      Pertinent Vitals/Pain Pain Assessment: Faces Faces Pain Scale: Hurts whole lot Pain Location: lt foot Pain Descriptors / Indicators: Constant;Grimacing;Guarding;Restless Pain Intervention(s): Premedicated before session;Limited activity within patient's tolerance;Repositioned;Monitored during session;Patient requesting pain meds-RN notified     Hand Dominance Right   Extremity/Trunk Assessment Upper Extremity Assessment Upper Extremity Assessment: Generalized weakness   Lower Extremity Assessment Lower Extremity Assessment: Defer to PT evaluation   Cervical / Trunk Assessment Cervical / Trunk Assessment: Normal   Communication Communication Communication: No difficulties   Cognition Arousal/Alertness: Awake/alert Behavior During Therapy: Anxious;WFL for tasks assessed/performed Overall Cognitive Status: Within Functional Limits for tasks assessed  General Comments       Exercises     Shoulder Instructions      Home Living Family/patient expects to be discharged to:: Private residence Living Arrangements: Alone Available Help at Discharge:  Family;Available 24 hours/day Type of Home: House Home Access: Level entry     Home Layout: One level     Bathroom Shower/Tub: Teacher, early years/pre: Standard     Home Equipment: Environmental consultant - 2 wheels          Prior Functioning/Environment Level of Independence: Independent                 OT Problem List: Decreased strength;Decreased activity tolerance;Impaired balance (sitting and/or standing);Decreased safety awareness;Decreased knowledge of use of DME or AE;Pain      OT Treatment/Interventions: Self-care/ADL training;Therapeutic exercise;Energy conservation;DME and/or AE instruction;Therapeutic activities;Patient/family education;Balance training    OT Goals(Current goals can be found in the care plan section) Acute Rehab OT Goals Patient Stated Goal: to get this foot taken off to reduce pain  OT Goal Formulation: With patient Time For Goal Achievement: 06/27/18 Potential to Achieve Goals: Good ADL Goals Pt Will Perform Grooming: with min guard assist;standing Pt Will Perform Lower Body Bathing: with min assist;sit to/from stand Pt Will Perform Lower Body Dressing: with min assist;sit to/from stand Pt Will Transfer to Toilet: with min assist;ambulating;regular height toilet;bedside commode;grab bars Pt Will Perform Toileting - Clothing Manipulation and hygiene: sit to/from stand;with min assist  OT Frequency: Min 2X/week   Barriers to D/C: Decreased caregiver support          Co-evaluation              AM-PAC PT "6 Clicks" Daily Activity     Outcome Measure Help from another person eating meals?: None Help from another person taking care of personal grooming?: A Little Help from another person toileting, which includes using toliet, bedpan, or urinal?: A Lot Help from another person bathing (including washing, rinsing, drying)?: A Lot Help from another person to put on and taking off regular upper body clothing?: A Little Help from another  person to put on and taking off regular lower body clothing?: Total 6 Click Score: 15   End of Session Equipment Utilized During Treatment: Rolling walker;Gait belt Nurse Communication: Mobility status  Activity Tolerance: Patient tolerated treatment well Patient left: in chair;with call bell/phone within reach;with nursing/sitter in room  OT Visit Diagnosis: Unsteadiness on feet (R26.81);Pain Pain - Right/Left: Left Pain - part of body: Ankle and joints of foot                Time: 1420-1438 OT Time Calculation (min): 18 min Charges:  OT General Charges $OT Visit: 1 Visit OT Evaluation $OT Eval Moderate Complexity: 1 Mod G-Codes:     [facility], OTR/L [phone]   Lucille Passy M 06/13/2018, 3:01 PM

## 2018-06-13 NOTE — Progress Notes (Signed)
Pharmacy Antibiotic Note  [name] is a 83 y.o. male admitted on 06/07/2018 with foot pain.  Pharmacy has been consulted for zosyn dosing for gangrene. Pt is afebrile and WBC now WNL. SCr is also elevated. Pt is underwent revascularization on 6/27, now planning left transmetatarsal amputation on 7/3. Will follow-up the length of antibiotic therapy post amputation.  Plan: Zosyn 3.375gm IV Q8H (4 hr inf) F/u renal fxn, C&S, clinical status   Height: 5\' 5"  (165.1 cm) Weight: 177 lb 4 oz (80.4 kg) IBW/kg (Calculated) : 61.5  Temp (24hrs), Avg:98.5 F (36.9 C), Min:98 F (36.7 C), Max:98.7 F (37.1 C)  Recent Labs  Lab 06/07/18 1257 06/07/18 1450  06/09/18 0502 06/10/18 0420 06/10/18 2033 06/11/18 0539 06/12/18 0318 06/13/18 0509  WBC 17.1*  --    < > 9.8 8.9 14.0* 10.5 9.4 9.8  CREATININE 2.58*  --    < > 2.36* 2.10*  --  2.18* 2.26* 2.45*  LATICACIDVEN 0.9 1.0  --   --   --   --   --   --   --    < > = values in this interval not displayed.    Estimated Creatinine Clearance: 23.1 mL/min (A) (by C-G formula based on SCr of 2.45 mg/dL (H)).    No Known Allergies  Antimicrobials this admission: Zosyn 6/24>>  Dose adjustments this admission: N/A  Microbiology results: UCx: Ecoli, R ampicillin, Unasyn  Thank you for allowing pharmacy to be a part of this patient's care.  Jodean Lima Estel Scholze 06/13/2018 1:36 PM

## 2018-06-13 NOTE — Progress Notes (Addendum)
     Subjective: 3 Days Post-Op Procedure(s) (LRB): LEFT COMMON FEMORAL TO BELOW KNEE POPLITEAL BYPASS GRAFT WITH PROPATEN (Left) Spoke with Dr. Sharol Given and he is aware of patient and will  See on rounds. Per Dr. Trula Slade left transmetatarsal amputation on Wed is the plan.   Patient reports pain as marked.    Objective:   VITALS:  Temp:  [98 F (36.7 C)-98.7 F (37.1 C)] 98.7 F (37.1 C) (06/30 1157) Pulse Rate:  [58-75] 67 (06/30 1157) Resp:  [12-21] 12 (06/30 1157) BP: (108-137)/(55-75) 132/75 (06/30 1157) SpO2:  [91 %-99 %] 91 % (06/30 0416) Weight:  [177 lb 4 oz (80.4 kg)] 177 lb 4 oz (80.4 kg) (06/30 0326)  Neurologically intact ABD soft Neurovascular intact Sensation intact distally Dorsiflexion/Plantar flexion intact left foot demarcating along the forefoot lateral 3 digits. Marland Kitchen   LABS Recent Labs    06/10/18 2033 06/11/18 0539 06/12/18 0318 06/13/18 0509  HGB 11.3* 10.8* 9.4* 9.7*  WBC 14.0* 10.5 9.4 9.8  PLT 219 185 187 208   Recent Labs    06/12/18 0318 06/13/18 0509  NA 136 138  K 3.9 3.3*  CL 104 104  CO2 24 28  BUN 44* 49*  CREATININE 2.26* 2.45*  GLUCOSE 256* 102*   No results for input(s): LABPT, INR in the last 72 hours.   Assessment/Plan: 3 Days Post-Op Procedure(s) (LRB): LEFT COMMON FEMORAL TO BELOW KNEE POPLITEAL BYPASS GRAFT WITH PROPATEN (Left)  Advance diet Surgery scheduled for Wed with Dr.Duda  Discussed with Dr. Sharol Given.   Basil Dess 06/13/2018, 12:07 PMPatient ID: [name], male   DOB: 08/02/1935, 83 y.o.   MRN: [id number]

## 2018-06-13 NOTE — Plan of Care (Signed)
Care plans reviewed and patient is progressing.  

## 2018-06-14 ENCOUNTER — Other Ambulatory Visit (INDEPENDENT_AMBULATORY_CARE_PROVIDER_SITE_OTHER): Payer: Self-pay | Admitting: Orthopedic Surgery

## 2018-06-14 DIAGNOSIS — Z79891 Long term (current) use of opiate analgesic: Secondary | ICD-10-CM

## 2018-06-14 DIAGNOSIS — Z95820 Peripheral vascular angioplasty status with implants and grafts: Secondary | ICD-10-CM

## 2018-06-14 DIAGNOSIS — I96 Gangrene, not elsewhere classified: Secondary | ICD-10-CM

## 2018-06-14 LAB — CBC
HEMATOCRIT: 29.7 % — AB (ref 39.0–52.0)
HEMOGLOBIN: 9.3 g/dL — AB (ref 13.0–17.0)
MCH: 29 pg (ref 26.0–34.0)
MCHC: 31.3 g/dL (ref 30.0–36.0)
MCV: 92.5 fL (ref 78.0–100.0)
PLATELETS: 194 10*3/uL (ref 150–400)
RBC: 3.21 MIL/uL — ABNORMAL LOW (ref 4.22–5.81)
RDW: 13.7 % (ref 11.5–15.5)
WBC: 10 10*3/uL (ref 4.0–10.5)

## 2018-06-14 LAB — BASIC METABOLIC PANEL
ANION GAP: 8 (ref 5–15)
BUN: 55 mg/dL — ABNORMAL HIGH (ref 8–23)
CHLORIDE: 101 mmol/L (ref 98–111)
CO2: 29 mmol/L (ref 22–32)
Calcium: 8.5 mg/dL — ABNORMAL LOW (ref 8.9–10.3)
Creatinine, Ser: 2.71 mg/dL — ABNORMAL HIGH (ref 0.61–1.24)
GFR calc Af Amer: 24 mL/min — ABNORMAL LOW (ref >60)
GFR, EST NON AFRICAN AMERICAN: 20 mL/min — AB (ref >60)
GLUCOSE: 91 mg/dL (ref 70–99)
POTASSIUM: 4 mmol/L (ref 3.5–5.1)
Sodium: 138 mmol/L (ref 135–145)

## 2018-06-14 LAB — GLUCOSE, CAPILLARY
Glucose-Capillary: 70 mg/dL (ref 70–99)
Glucose-Capillary: 122 mg/dL — ABNORMAL HIGH (ref 70–99)
Glucose-Capillary: 138 mg/dL — ABNORMAL HIGH (ref 70–99)
Glucose-Capillary: 161 mg/dL — ABNORMAL HIGH (ref 70–99)

## 2018-06-14 MED ORDER — SODIUM CHLORIDE 0.9 % IV SOLN
INTRAVENOUS | Status: DC | PRN
  Administered 2018-06-14: 100 mL via INTRAVENOUS
  Administered 2018-06-15 – 2018-06-16 (×3): via INTRAVENOUS

## 2018-06-14 MED ORDER — HYDROMORPHONE HCL 1 MG/ML IJ SOLN
1.0000 mg | INTRAMUSCULAR | Status: DC | PRN
Start: 2018-06-14 — End: 2018-06-16
  Administered 2018-06-14 – 2018-06-16 (×10): 1 mg via INTRAVENOUS
  Filled 2018-06-14 (×10): qty 1

## 2018-06-14 NOTE — Progress Notes (Addendum)
  Progress Note    06/14/2018 7:15 AM 4 Days Post-Op  Subjective:  No new complaints.  Some pain on bottom of L foot.   Vitals:   06/14/18 0500 06/14/18 0600  BP:    Pulse:    Resp: 17 19  Temp:    SpO2:     Physical Exam: Lungs:  Non labored on [place] Incisions:  L groin incision c/d/i; L popliteal incision c/d/i Extremities: L ATA by doppler; dampened L PTA by doppler; foot warm to touch Abdomen:  Soft Neurologic: A&O  CBC    Component Value Date/Time   WBC 10.0 06/14/2018 0437   RBC 3.21 (L) 06/14/2018 0437   HGB 9.3 (L) 06/14/2018 0437   HCT 29.7 (L) 06/14/2018 0437   PLT 194 06/14/2018 0437   MCV 92.5 06/14/2018 0437   MCH 29.0 06/14/2018 0437   MCHC 31.3 06/14/2018 0437   RDW 13.7 06/14/2018 0437   LYMPHSABS 1.3 06/07/2018 1257   MONOABS 1.4 (H) 06/07/2018 1257   EOSABS 0.2 06/07/2018 1257   BASOSABS 0.0 06/07/2018 1257    BMET    Component Value Date/Time   NA 138 06/14/2018 0437   K 4.0 06/14/2018 0437   CL 101 06/14/2018 0437   CO2 29 06/14/2018 0437   GLUCOSE 91 06/14/2018 0437   BUN 55 (H) 06/14/2018 0437   CREATININE 2.71 (H) 06/14/2018 0437   CALCIUM 8.5 (L) 06/14/2018 0437   GFRNONAA 20 (L) 06/14/2018 0437   GFRAA 24 (L) 06/14/2018 0437    INR    Component Value Date/Time   INR 1.37 06/08/2018 0049     Intake/Output Summary (Last 24 hours) at 06/14/2018 0715 Last data filed at 06/14/2018 0655 Gross per 24 hour  Intake 895 ml  Output 1900 ml  Net -1005 ml     Assessment/Plan:  83 y.o. male is s/p L femoral to BK popliteal bypass with PTFE 4 Days Post-Op   -Order placed to keep dry gauze in L groin -Doppler signals intact however L PTA dampened today; foot remains warm to touch with motor function intact -Pharmacy dosing IV abx; recheck BMP in am -Plan is for L TMA by Dr. Sharol Given on Wednesday   Dagoberto Ligas, PA-C [facility] [phone] 06/14/2018 7:15 AM   I have examined the patient, reviewed and agree with  above.  Surgical incisions healing.  2+ popliteal pulse.  Having continued severe ischemic pain in his foot.  Have increased his Dilaudid from 0.5 mg to 1 mg every 3 hours  Curt Jews, MD 06/14/2018 5:48 PM

## 2018-06-14 NOTE — Progress Notes (Signed)
Subjective:  Overnight: He reports of increased Left foot pain at the gangrene site. Rates at 7/10  Mr. Mesmer was evaluated at bedside this morning with a family member present. He continued to endorse Left foot pain but states was improved from last night. He is currently on Dilaudid 0.5mg  IV q3 hours prn for severe pain. Currently he denies headaches, vision changes, shortness of breath, abdominal pain, nausea or vomiting.   Objective:  Vital signs in last 24 hours: Vitals:   06/14/18 0600 06/14/18 0857 06/14/18 1217 06/14/18 1218  BP:  129/68 119/78   Pulse:  61 (!) 49   Resp: 19  18   Temp:   97.9 F (36.6 C) 97.9 F (36.6 C)  TempSrc:   Oral Oral  SpO2:   90%   Weight:      Height:       Physical Exams:  Constitutional: Pleasant gentleman, in moderate distress secondary to left foot pain HEENT: Atraumatic, normocephalic  Eyes: Non-icteric  Respiration: Clear to auscultation bilaterally in anterior and posterior lung fields Cardio: Normal S1 and S2, RRR Abdomen: Soft, positive bowel sounds, non-distended  Extremities: LLE pulses non palpable (Dorsalis pedis), foot is warm to touch, Left 3rd, 4th, 5th metatarsals gangrenous with clear discharge, malodorous  Assessment/Plan:  Active Problems:   Atherosclerosis of native arteries of extremities with gangrene, left leg (HCC)   Necrotic toes (HCC)   PAD (peripheral artery disease) (HCC)   Preoperative cardiovascular examination   S/P CABG (coronary artery bypass graft)   Severe protein-calorie malnutrition (HCC)   Uncontrolled type 2 diabetes mellitus with polyneuropathy [facility])   Mr. Edgington is an 83 year old male with PMHx significant for PAD, HFpEF (EF 60%-65%), CKD III, A-Fib, CAD s/p CABG, DM, HTN, HLD, Hypothyroidism and amenia who presented on 06/07/18 with left foot pain and gangrenous toes.    Atherosclerosis of native arteries of extremities with gangrene, left leg:  Since admission, he has had worsening of  left 3rd, 4th and 5th metatarsal necrosis. In addition, necrosis seems to be spreading medially to 2nd metatarsal. Upon further evaluation, there was concern for cellulitis because surrounding tissue was erythematous and warm but has since improved. He was evaluated by Vascular surgery and was found to have occluded left SFA, popliteal, anterior tibial and posterior tibial arteries and is s/p LLE Fem-Pop bypass on 6/27. ATA and PTA pulses were detected by doppler on 6/28. Upon evaluation by Orthopedics, he is scheduled for a transmetatarsal amputation  with application of wound vac on 06/16/18 by Dr. Sharol Given.  - Cardiology evaluated patient on 06/08/18 and considered patient high risk for surgery but noted that amputation was lower risk. It was recommended to start patient on a beta-blocker therapy for perioperative cardiovascular risk reduction.  - Scheduled for a transmetatarsal amputation  with application of wound vac on 06/16/18 by Dr. Sharol Given.  - Echocardiogram:  EF 55-60%; G2DD - Oxy-apap 10-325mg  q6hr prn - Oxycodone 5mg  q4h prn for breakthrough - Dilaudid 0.5mg  q3h PRN for severe breakthrough - Continue Zosyn 3.375g IV/12.29mL/hr - Heparin 5000U SQ q8 - AM CBC and BMP  DM: On Lantus 40U BID and Novolog 5-15U TID at home - Lantus 25U BID - Novolog 4U TID  - Novolog Sliding scale   Anemia:  - Hgb 9.3 this am (was 9.7 on 06/13/18) - Will continue to monitor  HTN:  - On amlodipine 5mg  daily at home. - Reduced to 2.5mg  daily as patient starting on BB per cardiology recommendation  CHF (EF 60-65%): > Weight up 2lbs this admission - Lasix 40mg  BID (Cr 2.71 <-- 2.45); Lasix was held  Overnight (6/30) but resumed this morning  - Started on Metoprolol 12.5 BID for perioperative risk reduction, dose held this morning due to HR in the 74s  - KDurg 44mEq for K of 4.0 this AM  CAD (s/p CABG):  - Continue home ASA and Atrovastatin - Started on Metoprolol 12.5 BID for perioperative risk reduction     - Daily ASA  UTI:  - UA with large Leukocytes and many bacteria. > Ur Culture: E. Coli > 100,000 - Patient covered with zosyn, completed adequate course for this - Afebrile   Chest Pain:  - Resolved. Patient described intermittent sharp chest pain last only a second without associated symptoms overnight 6/27. No signs of ischemia on EKG. > No further chest pain - Continue to monitor  CKD III: Stable, Cr at baseline. (2.71 <-- 2.45 <-- 2.26) A-fib: Sinus rhythm here. Not on any anticoagulation at home. HLD: Continue home Atrovastatin Hypothyroidism: Continue home Synthroid 13mcg Daily Long term opioid therapy: Per database and pt, on oxy-apap 10-325 TID at home  FEN: HH, CM VTE ppx: Heparin Code Status: FULL  Dispo: Anticipated discharge pending transmetatarsal amputation and further management.   Jean Rosenthal, MD 06/14/2018, 3:34 PM Pager: [phone]

## 2018-06-14 NOTE — Progress Notes (Signed)
  Date: 06/14/2018  Patient name: [name]  Medical record number: [id number]  Date of birth: 1935-05-20   I have seen and evaluated this patient and I have discussed the plan of care with the house staff. Please see their note for complete details. I concur with their findings with the following additions/corrections: [name] was seen on AM rounds. His wife was at bedside.   1. Gangrene L 3rd - 5th toes - s/p revascularization. For transmetatarsal amputation on the 3rd. After source control, ABX (zosyn) may be stopped. Dr Sharol Given indicates ppt will be non weight bearing so will likely need SNF but PT on board. Pain well controlled.  2. High peri-operative cardiac risk - reviewed card's note. Due to high risk, they started peri-op BB on the 25th. This may decrease peri-op cardiac complication but may increase stroke and mortality risk. Will need to ensure he does not develop hypotension.   3. DM - seems to have tight control with current insulin regimen.  4. PAD - on statin, ASA.  5. CRF - Cr slow rise up over past  Several days to 2.71 today. Likely multifactorial. Hopefully, once source control obtained, will decrease.   Bartholomew Crews, MD 06/14/2018, 4:48 PM

## 2018-06-14 NOTE — H&P (View-Only) (Signed)
Patient ID: [name], male   DOB: 07-25-1935, 83 y.o.   MRN: [id number] Patient is status post revascularization of the left lower extremity with gangrene of the third fourth and fifth toes.  Patient has had excellent revascularization to the left lower extremity his foot is warm there is no ascending cellulitis.  I have discussed with patient's family proceeding with a transmetatarsal amputation on Wednesday application of incisional wound VAC nonweightbearing postoperatively.  Patient states he understands wished to proceed with surgery.

## 2018-06-14 NOTE — Progress Notes (Signed)
Patient ID: [name], male   DOB: 1935/12/03, 83 y.o.   MRN: [id number] Patient is status post revascularization of the left lower extremity with gangrene of the third fourth and fifth toes.  Patient has had excellent revascularization to the left lower extremity his foot is warm there is no ascending cellulitis.  I have discussed with patient's family proceeding with a transmetatarsal amputation on Wednesday application of incisional wound VAC nonweightbearing postoperatively.  Patient states he understands wished to proceed with surgery.

## 2018-06-14 NOTE — Progress Notes (Signed)
Physical Therapy Treatment Patient Details Name: [name] MRN: [id number] DOB: 04-20-1935 Today's Date: 06/14/2018    History of Present Illness Pt adm with lt foot gangrene. Underwent lt femoral to below knee popliteal bypass on 06/10/18. Pt to have transmetatarsal amputation on 7/3. PMH - cad, afib, dm, chf, ckd, htn, pad, cabg    PT Comments    Pt making slow progress. Pt plans to have transmetatarsal amputation on 7/3 which will affect mobility status. Continue to recommend ST-SNF.    Follow Up Recommendations  SNF;Supervision/Assistance - 24 hour(if pt's weight bearing is limited after amputation)     Equipment Recommendations  Other (comment)(To be assessed)    Recommendations for Other Services       Precautions / Restrictions Precautions Precautions: Fall Restrictions Weight Bearing Restrictions: No    Mobility  Bed Mobility Overal bed mobility: Needs Assistance Bed Mobility: Supine to Sit;Sit to Supine     Supine to sit: Min assist Sit to supine: Min assist   General bed mobility comments: Assist to elevate trunk into sitting. Assist to bring legs back up into bed  Transfers Overall transfer level: Needs assistance Equipment used: Rolling walker (2 wheeled) Transfers: Sit to/from Stand Sit to Stand: Min assist;+2 safety/equipment Stand pivot transfers: Min assist;+2 safety/equipment       General transfer comment: Assist to bring hips up and for balance.  Ambulation/Gait Ambulation/Gait assistance: Min assist Gait Distance (Feet): 15 Feet Assistive device: Rolling walker (2 wheeled) Gait Pattern/deviations: Step-to pattern;Decreased step length - right;Decreased step length - left;Trunk flexed;Decreased stance time - left;Decreased weight shift to left;Antalgic Gait velocity: decr Gait velocity interpretation: <1.31 ft/sec, indicative of household ambulator General Gait Details: Assist for balance and support. Verbal cues to place weight on heel  on lt to decr pain   Stairs             Wheelchair Mobility    Modified Rankin (Stroke Patients Only)       Balance Overall balance assessment: Needs assistance Sitting-balance support: Bilateral upper extremity supported;Feet supported Sitting balance-Leahy Scale: Poor Sitting balance - Comments: Initial min assist and then supervision but UE support Postural control: Posterior lean Standing balance support: Bilateral upper extremity supported;No upper extremity supported Standing balance-Leahy Scale: Poor Standing balance comment: walker and min assist for static standing                            Cognition Arousal/Alertness: Awake/alert Behavior During Therapy: WFL for tasks assessed/performed Overall Cognitive Status: Within Functional Limits for tasks assessed                                        Exercises      General Comments        Pertinent Vitals/Pain Pain Assessment: Faces Faces Pain Scale: Hurts whole lot Pain Location: lt foot Pain Descriptors / Indicators: Grimacing;Guarding Pain Intervention(s): Limited activity within patient's tolerance;Monitored during session;Premedicated before session;Repositioned    Home Living                      Prior Function            PT Goals (current goals can now be found in the care plan section) Progress towards PT goals: Progressing toward goals    Frequency    Min 3X/week  PT Plan Current plan remains appropriate    Co-evaluation              AM-PAC PT "6 Clicks" Daily Activity  Outcome Measure  Difficulty turning over in bed (including adjusting bedclothes, sheets and blankets)?: Unable Difficulty moving from lying on back to sitting on the side of the bed? : Unable Difficulty sitting down on and standing up from a chair with arms (e.g., wheelchair, bedside commode, etc,.)?: Unable Help needed moving to and from a bed to chair (including a  wheelchair)?: A Little Help needed walking in hospital room?: A Little Help needed climbing 3-5 steps with a railing? : Total 6 Click Score: 10    End of Session Equipment Utilized During Treatment: Gait belt Activity Tolerance: Patient tolerated treatment well Patient left: in bed;with call bell/phone within reach;with family/visitor present Nurse Communication: Mobility status PT Visit Diagnosis: Unsteadiness on feet (R26.81);Other abnormalities of gait and mobility (R26.89);Muscle weakness (generalized) (M62.81);Pain Pain - Right/Left: Left Pain - part of body: Ankle and joints of foot     Time: 9539-6728 PT Time Calculation (min) (ACUTE ONLY): 20 min  Charges:  $Gait Training: 8-22 mins                    G Codes:       [facility] PT [place] 06/14/2018, 2:20 PM

## 2018-06-15 LAB — CBC
HCT: 29 % — ABNORMAL LOW (ref 39.0–52.0)
Hemoglobin: 9.1 g/dL — ABNORMAL LOW (ref 13.0–17.0)
MCH: 29.3 pg (ref 26.0–34.0)
MCHC: 31.4 g/dL (ref 30.0–36.0)
MCV: 93.2 fL (ref 78.0–100.0)
PLATELETS: 212 10*3/uL (ref 150–400)
RBC: 3.11 MIL/uL — ABNORMAL LOW (ref 4.22–5.81)
RDW: 14 % (ref 11.5–15.5)
WBC: 10 10*3/uL (ref 4.0–10.5)

## 2018-06-15 LAB — BASIC METABOLIC PANEL
ANION GAP: 10 (ref 5–15)
BUN: 53 mg/dL — ABNORMAL HIGH (ref 8–23)
CALCIUM: 8.4 mg/dL — AB (ref 8.9–10.3)
CO2: 26 mmol/L (ref 22–32)
Chloride: 100 mmol/L (ref 98–111)
Creatinine, Ser: 2.63 mg/dL — ABNORMAL HIGH (ref 0.61–1.24)
GFR, EST AFRICAN AMERICAN: 25 mL/min — AB (ref >60)
GFR, EST NON AFRICAN AMERICAN: 21 mL/min — AB (ref >60)
Glucose, Bld: 110 mg/dL — ABNORMAL HIGH (ref 70–99)
POTASSIUM: 3.3 mmol/L — AB (ref 3.5–5.1)
SODIUM: 136 mmol/L (ref 135–145)

## 2018-06-15 LAB — GLUCOSE, CAPILLARY
GLUCOSE-CAPILLARY: 135 mg/dL — AB (ref 70–99)
Glucose-Capillary: 116 mg/dL — ABNORMAL HIGH (ref 70–99)
Glucose-Capillary: 150 mg/dL — ABNORMAL HIGH (ref 70–99)
Glucose-Capillary: 152 mg/dL — ABNORMAL HIGH (ref 70–99)

## 2018-06-15 MED ORDER — INSULIN GLARGINE 100 UNIT/ML ~~LOC~~ SOLN
15.0000 [IU] | Freq: Two times a day (BID) | SUBCUTANEOUS | Status: DC
  Administered 2018-06-15 – 2018-06-16 (×2): 15 [IU] via SUBCUTANEOUS
  Filled 2018-06-15 (×4): qty 0.15

## 2018-06-15 MED ORDER — POTASSIUM CHLORIDE CRYS ER 20 MEQ PO TBCR
40.0000 meq | EXTENDED_RELEASE_TABLET | Freq: Two times a day (BID) | ORAL | Status: AC
  Administered 2018-06-15 (×2): 40 meq via ORAL
  Filled 2018-06-15 (×2): qty 2

## 2018-06-15 MED ORDER — CHLORHEXIDINE GLUCONATE 4 % EX LIQD
60.0000 mL | Freq: Once | CUTANEOUS | Status: AC
  Administered 2018-06-16: 4 via TOPICAL
  Filled 2018-06-15: qty 60

## 2018-06-15 NOTE — Progress Notes (Signed)
Subjective: Hospital Day: 8  Overnight: Patient was seen by Dr. Sharol Given (Orthopedics) and Dr. Donnetta Hutching (Vascular Surgery) to discuss surgery with patient. He complained of mild pain which was relieved after he received his pain medication.  Today, [name] was eating breakfast at the time of evaluation. He reports improvement of Left foot pain. Rates pain at 5/10. He currently denies fevers, chills, palpitation, chest pain or shortness of breath.  Objective:  Vital signs in last 24 hours: Vitals:   06/14/18 1218 06/14/18 1943 06/15/18 0504 06/15/18 0840  BP:  (!) 110/55 (!) 121/58 (!) 119/59  Pulse:  (!) 57 (!) 51   Resp:  15 20   Temp: 97.9 F (36.6 C) 98.1 F (36.7 C) 98.2 F (36.8 C)   TempSrc: Oral Oral Oral   SpO2:  95% 100%   Weight:   185 lb 10 oz (84.2 kg)   Height:        Physical Exams:  Constitutional: In no acute distress HEENT: Atraumatic, normocephalic  Cardiovascular: RRR, no murmurs, gallops, rubs Respiratory: CTABL, no wheezes, crackles, rhonchi  Abdomen: Soft, non-distended, non tender  Extremities: Left foot is warm to touch; Left 3rd, 4th, 5th metatarsals gangrenous , malodorous    Assessment/Plan:  Active Problems:   Atherosclerosis of native arteries of extremities with gangrene, left leg (HCC)   Necrotic toes (HCC)   PAD (peripheral artery disease) (HCC)   Preoperative cardiovascular examination   S/P CABG (coronary artery bypass graft)   Severe protein-calorie malnutrition (HCC)   Uncontrolled type 2 diabetes mellitus with polyneuropathy [facility])   [name] is an 83 year old male with PMHx significant for PAD, HFpEF (EF 60%-65%), CKD III, A-Fib, CAD s/p CABG, DM, HTN, HLD, Hypothyroidism and amenia who presented on 06/07/18 with left foot pain and gangrenous toes.    Atherosclerosis of native arteries of extremities with gangrene, left leg:  Since admission, he has had worsening of left 3rd, 4th and 5th metatarsal necrosis. In addition,  necrosis seems to be spreading medially to 2nd metatarsal. Upon further evaluation, there was concern for cellulitis because surrounding tissue was erythematous and warm but has since improved. He was evaluated by Vascular surgery and was found to have occluded left SFA, popliteal, anterior tibial and posterior tibial arteries and is s/p LLE Fem-Pop bypass on 6/27. ATA and PTA pulses were detected by doppler on 6/28. Upon evaluation by Orthopedics, he is scheduled for a transmetatarsal amputation  with application of wound vac on 06/16/18 by Dr. Sharol Given.  - Cardiology evaluated patient on 06/08/18 and considered patient high risk for surgery but noted that amputation was lower risk. It was recommended to start patient on a beta-blocker therapy for perioperative cardiovascular risk reduction.  - Scheduled for a transmetatarsal amputation  with application of wound vac on 06/16/18 by Dr. Sharol Given.  - Echocardiogram: EF 55-60%; G2DD - Oxy-apap 10-325mg  q6hr prn - Oxycodone 5mg  q4hprn for breakthrough - Dilaudid changed from 0.5mg  Q3h prn to 1mg  Q3prn severe breakthrough - Continue Zosyn 3.375g IV/12.73mL/hr - Heparin5000U SQ q8 - AM CBC and BMP  DM:  - Adjusted due to scheduled TMA by ortho on 06/15/18 - Lantus 25U BID to 15U BID - Novolog 4U TID  - Novolog Sliding scale   Anemia:  - Hgb 9.1this am (was 9.3 on 06/14/18) - Will continue to monitor  HTN:  - Patients blood pressure has been stable in the 120s/50s. His pulse in the last 24 hours has ranged from 49-57 bpm and  given his scheduled surgery on 06/16/18, we are holding his Amlodipine 2.5mg  PO daily and Metoprolol 12.5mg  BID   CHF (EF 60-65%): > Weight up 2lbs this admission - Lasix 40mg BID (HELD)  - KDurg 75mEq for K of 3.3 this AM  CAD (s/p CABG):  - Continue home ASA and Atrovastatin -Metoprolol 12.5 BID (HELD) due to HR in the 50s and scheduled surgery  - Daily ASA  UTI:  - UA with large Leukocytes and many bacteria. > Ur Culture:  E. Coli > 100,000 - Patient covered with zosyn, completed adequate course for this. Will consider weaning off after surgery once source control is achieved  - Afebrile   Chest Pain: - Resolved.Patient described intermittent sharp chest pain last only a second without associated symptoms overnight 6/27. No signs of ischemia on EKG. > No further chest pain - Continue to monitor  CKD III: Stable, Cr at baseline. (2.63 <--2.71 <-- 2.45) A-fib: Sinus rhythm here. Not on any anticoagulation at home. HLD: Continue home Atrovastatin Hypothyroidism: Continue home Synthroid 58mcg Daily Long term opioid therapy: Per database and pt, on oxy-apap 10-325 TID at home  FEN: HH, CM VTE ppx: Heparin Code Status: FULL  Dispo: Anticipated discharge pending transmetatarsal amputation and further management.   Victor Rosenthal, MD 06/15/2018, 11:24 AM Pager: [phone]

## 2018-06-15 NOTE — Progress Notes (Addendum)
  Progress Note    06/15/2018 7:44 AM 5 Days Post-Op  Subjective:  Patient more comfortable this morning   Vitals:   06/14/18 1943 06/15/18 0504  BP: (!) 110/55 (!) 121/58  Pulse: (!) 57 (!) 51  Resp: 15 20  Temp: 98.1 F (36.7 C) 98.2 F (36.8 C)  SpO2: 95% 100%   Physical Exam: Lungs:  Non labored on RA Incisions:  L groin incision c/d/i; L popliteal incision c/d/i Extremities:  Palpable popliteal pulse LLE; gangrene L foot Abdomen:  Soft Neurologic: A&O  CBC    Component Value Date/Time   WBC 10.0 06/15/2018 0256   RBC 3.11 (L) 06/15/2018 0256   HGB 9.1 (L) 06/15/2018 0256   HCT 29.0 (L) 06/15/2018 0256   PLT 212 06/15/2018 0256   MCV 93.2 06/15/2018 0256   MCH 29.3 06/15/2018 0256   MCHC 31.4 06/15/2018 0256   RDW 14.0 06/15/2018 0256   LYMPHSABS 1.3 06/07/2018 1257   MONOABS 1.4 (H) 06/07/2018 1257   EOSABS 0.2 06/07/2018 1257   BASOSABS 0.0 06/07/2018 1257    BMET    Component Value Date/Time   NA 136 06/15/2018 0256   K 3.3 (L) 06/15/2018 0256   CL 100 06/15/2018 0256   CO2 26 06/15/2018 0256   GLUCOSE 110 (H) 06/15/2018 0256   BUN 53 (H) 06/15/2018 0256   CREATININE 2.63 (H) 06/15/2018 0256   CALCIUM 8.4 (L) 06/15/2018 0256   GFRNONAA 21 (L) 06/15/2018 0256   GFRAA 25 (L) 06/15/2018 0256    INR    Component Value Date/Time   INR 1.37 06/08/2018 0049     Intake/Output Summary (Last 24 hours) at 06/15/2018 0744 Last data filed at 06/14/2018 1700 Gross per 24 hour  Intake 645.04 ml  Output 875 ml  Net -229.96 ml     Assessment/Plan:  83 y.o. male is s/p L femoral to popliteal bypass with PTFE 5 Days Post-Op   Patent bypass with palpable popliteal pulse Incisions healing well; continue dry gauze to L groin fold Dr. Sharol Given planning TMA tomorrow 06/16/18   Dagoberto Ligas, PA-C [facility] [phone] 06/15/2018 7:44 AM

## 2018-06-15 NOTE — Progress Notes (Signed)
  Date: 06/15/2018  Patient name: DSHAWN MCNAY  Medical record number: [id number]  Date of birth: [birth year]   I have seen and evaluated this patient and I have discussed the plan of care with the house staff. Please see their note for complete details. I concur with their findings with the following additions/corrections: Mr Lorusso was seen on AM rounds. Daughter Estill Bamberg was at bedside. Mr Parmer is ready for surgery tomorrow. Dr Eileen Stanford is adjust meds accordingly. Once source control completed, ABX may be stopped. He will likely need SNF at D/C for post op PT.  Bartholomew Crews, MD 06/15/2018, 2:41 PM

## 2018-06-15 NOTE — Progress Notes (Signed)
Visited Patient on Monday after first surgery.  He is preparing for another surgery on Wednesday.  Patient shared concerns. Chaplain provided spiritual support, listening, prayer and encouragement.   Conard Novak, Chaplain   06/15/18 1000  Clinical Encounter Type  Visited With Patient  Visit Type Spiritual support  Referral From Nurse  Consult/Referral To Chaplain  Spiritual Encounters  Spiritual Needs Prayer;Emotional  Stress Factors  Patient Stress Factors Not reviewed  Family Stress Factors Not reviewed

## 2018-06-15 NOTE — Care Management Note (Signed)
Case Management Note Marvetta Gibbons RN, BSN Unit 4E-Case Manager [phone]  Patient Details  Name: [name] MRN: [id number] Date of Birth: Aug 04, 1935  Subjective/Objective:  Pt admitted with gangrene s/p revascularization with left fempop bypass per vascular on 6/27- plan for return to OR on 7/3 for TMA per Dr. Sharol Given. With wound VAC placement                 Action/Plan: PTA pt lived at home, per PT/OT evals recommendation for STSNF post op- CSW following for placement needs.   Expected Discharge Date:                  Expected Discharge Plan:  Skilled Nursing Facility  In-House Referral:  Clinical Social Work  Discharge planning Services  CM Consult  Post Acute Care Choice:  NA Choice offered to:     DME Arranged:    DME Agency:     HH Arranged:    [place] Agency:     Status of Service:  In process, will continue to follow  If discussed at Long Length of Stay Meetings, dates discussed:    Discharge Disposition: skilled facility   Additional Comments:  Dawayne Patricia, RN 06/15/2018, 11:10 AM

## 2018-06-15 NOTE — Progress Notes (Signed)
Nutrition Follow-up  INTERVENTION:   - d/c Glucerna shake and Juven as pt does not like them  - RD ordered 8:00 pm snack: [place] and cheese sandwich  - RD ordered 2% milk with meal trays per pt request and to maximize protein intake to promote wound healing as pt does not like supplements  NUTRITION DIAGNOSIS:   Increased nutrient needs related to wound healing as evidenced by estimated needs.  Ongoing, being addressed with oral nutrition supplements  GOAL:   Patient will meet greater than or equal to 90% of their needs  Progressing  MONITOR:   PO intake, Supplement acceptance, Labs, Weight trends, Skin, I & O's  REASON FOR ASSESSMENT:   Malnutrition Screening Tool    ASSESSMENT:   [name] is an 83 yo M with Hx of PAD, CHF (EF 60-65%), CKD III, A-fib, CAD (s/p CABG), DM, HTN, HLD, Hypothyroidism, and Anemia who presented with pain and necrosis of his distal left lower extremity.   6/27 - s/p common femoral to below knee popliteal bypass graft  Noted plan for left transmetatarsal amputation on Wednesday with application of wound VAC.  Spoke with pt and granddaughter [name] at bedside. Pt states that his appetite "comes and goes." Pt reports eating well at lunch: chicken and dumplings with asparagus. Discussed with RN who reports pt is consistently eating about 50%.  Pt states that the Glucerna and Juven supplements are "disgusting" and that he will not drink them. Pt is agreeable to having chocolate milk with meal trays to maximize protein intake. Discussed with RN.  RD ordered 8:00 pm Nourishment per pt request: [place] and cheese sandwich.  Pt and granddaughter are very pleased with the staff at [facility] and are extremely appreciative.  Meal Completion: 25-100% since 06/13/18  Medications reviewed and include: Glucerna BID (pt is refusing), sliding scale Novolog, 4 units Novolog TID with meals, 15 units Lantus BID, 50 mcg levothyroxine daily, Juven BID, 40 mg Protonix  daily, Miralax daily, 40 mEq K-dur BID, Senokot daily  Labs reviewed: potassium 3.3 (L), BUN 53 (H), creatinine 2.63 (H), hemoglobin 9.1 (L), HCT 29.0 (L) CBG's: 150, 116, 161, 128 x 24 hours  UOP: 875 ml x 24 hours I/O's: +1.3 L since admission   Diet Order:   Diet Order           Diet NPO time specified  Diet effective midnight        Diet NPO time specified  Diet effective midnight        Diet renal/carb modified with fluid restriction Diet-HS Snack? Nothing; Fluid restriction: 1200 mL Fluid; Room service appropriate? Yes; Fluid consistency: Thin  Diet effective now          EDUCATION NEEDS:   Education needs have been addressed  Skin:  Skin Assessment: Reviewed RN Assessment Skin Integrity Issues:: Other (Comment), Incisions Incisions: R groin, buttocks, L groin, L leg Other: 3rd, 4th, and 5th L necrotic toes  Last BM:  06/11/18  Height:   Ht Readings from Last 1 Encounters:  06/10/18 5\' 5"  (1.651 m)    Weight:   Wt Readings from Last 1 Encounters:  06/15/18 185 lb 10 oz (84.2 kg)    Ideal Body Weight:  61.8 kg  BMI:  Body mass index is 30.89 kg/m.  Estimated Nutritional Needs:   Kcal:  1850-2050 kcal/day  Protein:  90-105 grams/day  Fluid:  > 1.8 L/day    Gaynell Face, MS, RD, LDN Pager: [phone] Weekend/After Hours: [phone]

## 2018-06-15 NOTE — Progress Notes (Signed)
Occupational Therapy Treatment Patient Details Name: [name] MRN: [id number] DOB: 1935-09-11 Today's Date: 06/15/2018    History of present illness Pt adm with lt foot gangrene. Underwent lt femoral to below knee popliteal bypass on 06/10/18. Pt to have transmetatarsal amputation on 7/3. PMH - cad, afib, dm, chf, ckd, htn, pad, cabg   OT comments  Pt progressing towards acute OT goals. Session limited by LLE pain 8/10, premedicated. D/c plan remains appropriate.    Follow Up Recommendations  SNF;Supervision/Assistance - 24 hour    Equipment Recommendations  None recommended by OT    Recommendations for Other Services      Precautions / Restrictions Precautions Precautions: Fall Restrictions Weight Bearing Restrictions: Yes       Mobility Bed Mobility Overal bed mobility: Needs Assistance Bed Mobility: Supine to Sit;Sit to Supine     Supine to sit: Min assist Sit to supine: Min assist   General bed mobility comments: Assist to elevate trunk into sitting. Assist to bring legs back up into bed  Transfers Overall transfer level: Needs assistance Equipment used: Rolling walker (2 wheeled) Transfers: Sit to/from Stand Sit to Stand: Min assist;+2 safety/equipment         General transfer comment: assist to powerup and steady    Balance Overall balance assessment: Needs assistance Sitting-balance support: Bilateral upper extremity supported;Feet supported Sitting balance-Leahy Scale: Poor Sitting balance - Comments: UE support and min A to supervision assist.  Postural control: Posterior lean Standing balance support: Bilateral upper extremity supported;No upper extremity supported Standing balance-Leahy Scale: Poor Standing balance comment: walker and min assist for static standing                           ADL either performed or assessed with clinical judgement   ADL Overall ADL's : Needs assistance/impaired                                       General ADL Comments: Pt limited by severe pain Lt LE. Bed mobility and stood 1x, minimal sidesteps along EOB.      Vision       Perception     Praxis      Cognition Arousal/Alertness: Awake/alert Behavior During Therapy: WFL for tasks assessed/performed Overall Cognitive Status: Within Functional Limits for tasks assessed                                          Exercises     Shoulder Instructions       General Comments      Pertinent Vitals/ Pain       Pain Assessment: 0-10 Pain Score: 8  Pain Location: lt foot Pain Descriptors / Indicators: Grimacing;Guarding Pain Intervention(s): Limited activity within patient's tolerance;Monitored during session;Premedicated before session;Repositioned  Home Living                                          Prior Functioning/Environment              Frequency  Min 2X/week        Progress Toward Goals  OT Goals(current goals can now be found in the care plan  section)  Progress towards OT goals: Progressing toward goals  Acute Rehab OT Goals Patient Stated Goal: to get this foot taken off to reduce pain  OT Goal Formulation: With patient Time For Goal Achievement: 06/27/18 Potential to Achieve Goals: Good ADL Goals Pt Will Perform Grooming: with min guard assist;standing Pt Will Perform Lower Body Bathing: with min assist;sit to/from stand Pt Will Perform Lower Body Dressing: with min assist;sit to/from stand Pt Will Transfer to Toilet: with min assist;ambulating;regular height toilet;bedside commode;grab bars Pt Will Perform Toileting - Clothing Manipulation and hygiene: sit to/from stand;with min assist  Plan Discharge plan remains appropriate    Co-evaluation                 AM-PAC PT "6 Clicks" Daily Activity     Outcome Measure   Help from another person eating meals?: None Help from another person taking care of personal grooming?: A  Little Help from another person toileting, which includes using toliet, bedpan, or urinal?: A Lot Help from another person bathing (including washing, rinsing, drying)?: A Lot Help from another person to put on and taking off regular upper body clothing?: A Little Help from another person to put on and taking off regular lower body clothing?: Total 6 Click Score: 15    End of Session Equipment Utilized During Treatment: Gait belt;Rolling walker;Oxygen  OT Visit Diagnosis: Unsteadiness on feet (R26.81);Pain Pain - Right/Left: Left Pain - part of body: Ankle and joints of foot   Activity Tolerance Patient limited by pain   Patient Left in bed;with call bell/phone within reach;with bed alarm set;with family/visitor present   Nurse Communication Patient requests pain meds        Time: 0034-9179 OT Time Calculation (min): 15 min  Charges: OT General Charges $OT Visit: 1 Visit OT Treatments $Self Care/Home Management : 8-22 mins     Hortencia Pilar 06/15/2018, 1:36 PM

## 2018-06-16 ENCOUNTER — Inpatient Hospital Stay (HOSPITAL_COMMUNITY): Payer: Medicare HMO | Admitting: Anesthesiology

## 2018-06-16 ENCOUNTER — Encounter (HOSPITAL_COMMUNITY)
Admission: EM | Disposition: A | Discharge status: Skilled Nursing Facility | Payer: Self-pay | Source: Home / Self Care | Attending: Internal Medicine

## 2018-06-16 ENCOUNTER — Ambulatory Visit: Payer: Medicare HMO | Admitting: Student

## 2018-06-16 DIAGNOSIS — I96 Gangrene, not elsewhere classified: Secondary | ICD-10-CM

## 2018-06-16 DIAGNOSIS — N184 Chronic kidney disease, stage 4 (severe): Secondary | ICD-10-CM

## 2018-06-16 DIAGNOSIS — L03116 Cellulitis of left lower limb: Secondary | ICD-10-CM

## 2018-06-16 HISTORY — PX: AMPUTATION: SHX166

## 2018-06-16 LAB — BASIC METABOLIC PANEL
Anion gap: 9 (ref 5–15)
BUN: 53 mg/dL — AB (ref 8–23)
CO2: 25 mmol/L (ref 22–32)
Calcium: 8.2 mg/dL — ABNORMAL LOW (ref 8.9–10.3)
Chloride: 104 mmol/L (ref 98–111)
Creatinine, Ser: 2.71 mg/dL — ABNORMAL HIGH (ref 0.61–1.24)
GFR calc Af Amer: 24 mL/min — ABNORMAL LOW (ref >60)
GFR calc non Af Amer: 20 mL/min — ABNORMAL LOW (ref >60)
GLUCOSE: 102 mg/dL — AB (ref 70–99)
Potassium: 3.6 mmol/L (ref 3.5–5.1)
SODIUM: 138 mmol/L (ref 135–145)

## 2018-06-16 LAB — CBC
HEMATOCRIT: 28.2 % — AB (ref 39.0–52.0)
Hemoglobin: 8.8 g/dL — ABNORMAL LOW (ref 13.0–17.0)
MCH: 28.9 pg (ref 26.0–34.0)
MCHC: 31.2 g/dL (ref 30.0–36.0)
MCV: 92.8 fL (ref 78.0–100.0)
Platelets: 200 10*3/uL (ref 150–400)
RBC: 3.04 MIL/uL — AB (ref 4.22–5.81)
RDW: 13.9 % (ref 11.5–15.5)
WBC: 8 10*3/uL (ref 4.0–10.5)

## 2018-06-16 LAB — SURGICAL PCR SCREEN
MRSA, PCR: NEGATIVE
STAPHYLOCOCCUS AUREUS: NEGATIVE

## 2018-06-16 LAB — GLUCOSE, CAPILLARY
GLUCOSE-CAPILLARY: 95 mg/dL (ref 70–99)
GLUCOSE-CAPILLARY: 135 mg/dL — AB (ref 70–99)
Glucose-Capillary: 114 mg/dL — ABNORMAL HIGH (ref 70–99)
Glucose-Capillary: 135 mg/dL — ABNORMAL HIGH (ref 70–99)
Glucose-Capillary: 171 mg/dL — ABNORMAL HIGH (ref 70–99)
Glucose-Capillary: 126 mg/dL — ABNORMAL HIGH (ref 70–99)

## 2018-06-16 LAB — TYPE AND SCREEN
ABO/RH(D): AB POS
Antibody Screen: NEGATIVE

## 2018-06-16 SURGERY — AMPUTATION, FOOT, PARTIAL
Anesthesia: General | Site: Foot | Laterality: Left

## 2018-06-16 MED ORDER — METOCLOPRAMIDE HCL 5 MG/ML IJ SOLN: 5.0000 mg | Freq: Three times a day (TID) | INTRAMUSCULAR | Status: DC | PRN

## 2018-06-16 MED ORDER — METHOCARBAMOL 500 MG PO TABS
500.0000 mg | ORAL_TABLET | Freq: Four times a day (QID) | ORAL | Status: DC | PRN
  Administered 2018-06-16: 500 mg via ORAL
  Filled 2018-06-16: qty 1

## 2018-06-16 MED ORDER — ONDANSETRON HCL 4 MG PO TABS: 4.0000 mg | ORAL_TABLET | Freq: Four times a day (QID) | ORAL | Status: DC | PRN

## 2018-06-16 MED ORDER — SODIUM CHLORIDE 0.9 % IV SOLN
INTRAVENOUS | Status: DC
  Administered 2018-06-16 – 2018-06-18 (×2): via INTRAVENOUS

## 2018-06-16 MED ORDER — HYDROMORPHONE HCL 1 MG/ML IJ SOLN
0.5000 mg | INTRAMUSCULAR | Status: DC | PRN
  Administered 2018-06-16 – 2018-06-17 (×2): 0.5 mg via INTRAVENOUS
  Administered 2018-06-17 (×2): 1 mg via INTRAVENOUS
  Administered 2018-06-18: 0.5 mg via INTRAVENOUS
  Filled 2018-06-16 (×5): qty 1

## 2018-06-16 MED ORDER — CEFAZOLIN SODIUM-DEXTROSE 2-4 GM/100ML-% IV SOLN
2.0000 g | INTRAVENOUS | Status: AC
  Administered 2018-06-16: 2 g via INTRAVENOUS
  Filled 2018-06-16 (×2): qty 100

## 2018-06-16 MED ORDER — LIDOCAINE HCL (CARDIAC) PF 100 MG/5ML IV SOSY
PREFILLED_SYRINGE | INTRAVENOUS | Status: DC | PRN
  Administered 2018-06-16: 30 mg via INTRAVENOUS

## 2018-06-16 MED ORDER — OXYCODONE-ACETAMINOPHEN 5-325 MG PO TABS
ORAL_TABLET | ORAL | Status: AC
  Filled 2018-06-16: qty 1

## 2018-06-16 MED ORDER — METOCLOPRAMIDE HCL 5 MG PO TABS: 5.0000 mg | ORAL_TABLET | Freq: Three times a day (TID) | ORAL | Status: DC | PRN

## 2018-06-16 MED ORDER — FENTANYL CITRATE (PF) 100 MCG/2ML IJ SOLN
INTRAMUSCULAR | Status: AC
  Filled 2018-06-16: qty 2

## 2018-06-16 MED ORDER — MAGNESIUM CITRATE PO SOLN: 1.0000 | Freq: Once | ORAL | Status: DC | PRN

## 2018-06-16 MED ORDER — FENTANYL CITRATE (PF) 100 MCG/2ML IJ SOLN
25.0000 ug | INTRAMUSCULAR | Status: DC | PRN
  Administered 2018-06-16 (×3): 50 ug via INTRAVENOUS

## 2018-06-16 MED ORDER — PROPOFOL 10 MG/ML IV BOLUS
INTRAVENOUS | Status: DC | PRN
  Administered 2018-06-16: 120 mg via INTRAVENOUS

## 2018-06-16 MED ORDER — 0.9 % SODIUM CHLORIDE (POUR BTL) OPTIME
TOPICAL | Status: DC | PRN
  Administered 2018-06-16: 1000 mL

## 2018-06-16 MED ORDER — CHLORHEXIDINE GLUCONATE CLOTH 2 % EX PADS: 6.0000 | MEDICATED_PAD | Freq: Once | CUTANEOUS | Status: DC

## 2018-06-16 MED ORDER — PHENYLEPHRINE HCL 10 MG/ML IJ SOLN
INTRAMUSCULAR | Status: DC | PRN
  Administered 2018-06-16: 100 ug via INTRAVENOUS

## 2018-06-16 MED ORDER — BISACODYL 10 MG RE SUPP: 10.0000 mg | Freq: Every day | RECTAL | Status: DC | PRN

## 2018-06-16 MED ORDER — POLYETHYLENE GLYCOL 3350 17 G PO PACK: 17.0000 g | PACK | Freq: Every day | ORAL | Status: DC | PRN

## 2018-06-16 MED ORDER — ONDANSETRON HCL 4 MG/2ML IJ SOLN
INTRAMUSCULAR | Status: DC | PRN
  Administered 2018-06-16: 4 mg via INTRAVENOUS

## 2018-06-16 MED ORDER — ONDANSETRON HCL 4 MG/2ML IJ SOLN: 4.0000 mg | Freq: Once | INTRAMUSCULAR | Status: DC | PRN

## 2018-06-16 MED ORDER — ONDANSETRON HCL 4 MG/2ML IJ SOLN: 4.0000 mg | Freq: Four times a day (QID) | INTRAMUSCULAR | Status: DC | PRN

## 2018-06-16 MED ORDER — DOCUSATE SODIUM 100 MG PO CAPS
100.0000 mg | ORAL_CAPSULE | Freq: Two times a day (BID) | ORAL | Status: DC
  Administered 2018-06-17 (×2): 100 mg via ORAL
  Filled 2018-06-16 (×4): qty 1

## 2018-06-16 MED ORDER — METHOCARBAMOL 1000 MG/10ML IJ SOLN
500.0000 mg | Freq: Four times a day (QID) | INTRAVENOUS | Status: DC | PRN
  Filled 2018-06-16: qty 5

## 2018-06-16 MED ORDER — OXYCODONE HCL 5 MG PO TABS
ORAL_TABLET | ORAL | Status: AC
  Filled 2018-06-16: qty 1

## 2018-06-16 MED ORDER — HYDROMORPHONE HCL 1 MG/ML IJ SOLN
0.5000 mg | INTRAMUSCULAR | Status: DC | PRN
  Administered 2018-06-16: 1 mg via INTRAVENOUS
  Filled 2018-06-16 (×2): qty 1

## 2018-06-16 SURGICAL SUPPLY — 36 items
APL SKNCLS STERI-STRIP NONHPOA (GAUZE/BANDAGES/DRESSINGS) ×4
BENZOIN TINCTURE PRP APPL 2/3 (GAUZE/BANDAGES/DRESSINGS) ×9 IMPLANT
BLADE SAW SGTL HD 18.5X60.5X1. (BLADE) ×3 IMPLANT
BLADE SURG 21 STRL SS (BLADE) ×3 IMPLANT
BNDG COHESIVE 4X5 TAN STRL (GAUZE/BANDAGES/DRESSINGS) IMPLANT
BNDG GAUZE ELAST 4 BULKY (GAUZE/BANDAGES/DRESSINGS) IMPLANT
CANISTER WOUNDNEG PRESSURE 500 (CANNISTER) ×2 IMPLANT
COVER SURGICAL LIGHT HANDLE (MISCELLANEOUS) ×3 IMPLANT
DRAPE INCISE IOBAN 66X45 STRL (DRAPES) ×3 IMPLANT
DRAPE U-SHAPE 47X51 STRL (DRAPES) ×3 IMPLANT
DRSG ADAPTIC 3X8 NADH LF (GAUZE/BANDAGES/DRESSINGS) IMPLANT
DRSG PAD ABDOMINAL 8X10 ST (GAUZE/BANDAGES/DRESSINGS) IMPLANT
DURAPREP 26ML APPLICATOR (WOUND CARE) ×3 IMPLANT
ELECT REM PT RETURN 9FT ADLT (ELECTROSURGICAL) ×3
ELECTRODE REM PT RTRN 9FT ADLT (ELECTROSURGICAL) ×1 IMPLANT
GAUZE SPONGE 4X4 12PLY STRL (GAUZE/BANDAGES/DRESSINGS) IMPLANT
GLOVE BIOGEL PI IND STRL 9 (GLOVE) ×1 IMPLANT
GLOVE BIOGEL PI INDICATOR 9 (GLOVE) ×2
GLOVE SURG ORTHO 9.0 STRL STRW (GLOVE) ×3 IMPLANT
GOWN STRL REUS W/ TWL XL LVL3 (GOWN DISPOSABLE) ×3 IMPLANT
GOWN STRL REUS W/TWL XL LVL3 (GOWN DISPOSABLE) ×9
KIT BASIN OR (CUSTOM PROCEDURE TRAY) ×3 IMPLANT
KIT PREVENA INCISION MGT 13 (CANNISTER) ×2 IMPLANT
KIT TURNOVER KIT B (KITS) ×3 IMPLANT
NS IRRIG 1000ML POUR BTL (IV SOLUTION) ×3 IMPLANT
PACK ORTHO EXTREMITY (CUSTOM PROCEDURE TRAY) ×3 IMPLANT
PAD ARMBOARD 7.5X6 YLW CONV (MISCELLANEOUS) ×6 IMPLANT
SPONGE LAP 18X18 X RAY DECT (DISPOSABLE) IMPLANT
SUT ETHILON 2 0 PSLX (SUTURE) ×6 IMPLANT
SUT VIC AB 2-0 CTB1 (SUTURE) IMPLANT
TOWEL OR 17X24 6PK STRL BLUE (TOWEL DISPOSABLE) ×3 IMPLANT
TOWEL OR 17X26 10 PK STRL BLUE (TOWEL DISPOSABLE) ×3 IMPLANT
TUBE CONNECTING 12'X1/4 (SUCTIONS) ×1
TUBE CONNECTING 12X1/4 (SUCTIONS) ×2 IMPLANT
WATER STERILE IRR 1000ML POUR (IV SOLUTION) ×3 IMPLANT
YANKAUER SUCT BULB TIP NO VENT (SUCTIONS) ×3 IMPLANT

## 2018-06-16 NOTE — Progress Notes (Signed)
PT Cancellation Note  Patient Details Name: [name] MRN: [id number] DOB: Mar 18, 1935   Cancelled Treatment:    Reason Eval/Treat Not Completed: Patient at procedure or test/unavailable. Pt currently undergoing transmetatarsal amputation. Will check back as time allow post op.  Benjiman Core, PTA Pager [phone] Acute Rehab   Allena Katz 06/16/2018, 10:51 AM

## 2018-06-16 NOTE — Interval H&P Note (Signed)
History and Physical Interval Note:  06/16/2018 6:44 AM  [name]  has presented today for surgery, with the diagnosis of Gangrene Left Foot  The various methods of treatment have been discussed with the patient and family. After consideration of risks, benefits and other options for treatment, the patient has consented to  Procedure(s): LEFT TRANSMETATARSAL AMPUTATION (Left) as a surgical intervention .  The patient's history has been reviewed, patient examined, no change in status, stable for surgery.  I have reviewed the patient's chart and labs.  Questions were answered to the patient's satisfaction.     [name]

## 2018-06-16 NOTE — Anesthesia Postprocedure Evaluation (Signed)
Anesthesia Post Note  Patient: [name]  Procedure(s) Performed: LEFT TRANSMETATARSAL AMPUTATION (Left Foot)     Patient location during evaluation: PACU Anesthesia Type: General Level of consciousness: awake and alert Pain management: pain level controlled Vital Signs Assessment: post-procedure vital signs reviewed and stable Respiratory status: spontaneous breathing, nonlabored ventilation, respiratory function stable and patient connected to nasal cannula oxygen Cardiovascular status: blood pressure returned to baseline and stable Postop Assessment: no apparent nausea or vomiting Anesthetic complications: no    Last Vitals:  Vitals:   06/16/18 1330 06/16/18 1400  BP:  (!) 101/52  Pulse: (!) 56 (!) 55  Resp: (!) 22 16  Temp:    SpO2: 94% (!) 86%    Last Pain:  Vitals:   06/16/18 1323  TempSrc: Oral  PainSc:                  Chadd Tollison P Jozelynn Danielson

## 2018-06-16 NOTE — Discharge Summary (Addendum)
Name: [name] MRN: [id number] DOB: 08/27/1935 83 y.o. PCP: Medicine, [facility] Internal  Date of Admission: 06/07/2018 12:38 PM Date of Discharge: 06/18/2018 Attending Physician: Bartholomew Crews, MD  Discharge Diagnosis:  1. Gangrene Left Foot s/p Left transmetatarsal amputation  Discharge Medications: Allergies as of 06/18/2018   No Known Allergies     Medication List    STOP taking these medications   amLODipine 5 MG tablet Commonly known as:  NORVASC   furosemide 40 MG tablet Commonly known as:  LASIX     TAKE these medications   acetaminophen 325 MG tablet Commonly known as:  TYLENOL Take 2 tablets (650 mg total) by mouth every 6 (six) hours as needed for mild pain (or Fever >/= 101).   aspirin 81 MG tablet Take 81 mg by mouth at bedtime.   atorvastatin 40 MG tablet Commonly known as:  LIPITOR Take 40 mg by mouth at bedtime.   insulin aspart 100 UNIT/ML injection Commonly known as:  novoLOG Inject 5-15 Units into the skin 3 (three) times daily as needed for high blood sugar (CBG >200).   insulin glargine 100 UNIT/ML injection Commonly known as:  LANTUS Inject 0.25 mLs (25 Units total) into the skin 2 (two) times daily. What changed:  how much to take   levothyroxine 50 MCG tablet Commonly known as:  SYNTHROID, LEVOTHROID Take 50 mcg by mouth daily.   ondansetron 4 MG tablet Commonly known as:  ZOFRAN Take 1 tablet (4 mg total) by mouth every 6 (six) hours as needed for nausea.   piperacillin-tazobactam 3.375 GM/50ML IVPB Commonly known as:  ZOSYN Inject 50 mLs (3.375 g total) into the vein every 8 (eight) hours for 1 day.   senna 8.6 MG Tabs tablet Commonly known as:  SENOKOT Take 1 tablet (8.6 mg total) by mouth daily.       Disposition and follow-up:   Mr.[name] was discharged from [facility] in [place] condition.  At the hospital follow up visit please address:  Severe peripheral arterial disease of left leg  with gangrene foot (3rd, 4th and 5th metatarsal) s/p transmetatarsal amputation.  - Ensure follow up with vascular surgery and orthopedics  Diabetes:  - His insulin regimen was decreased from Lantus 40U BID TO 25U BID as he was put on a modified Card diet. - He was ordered a Modified Card diet at discharge to SNF and Lantus 25U BID was kept.  - Please optimize insulin regimen as needed   CAD CHF Hypertension with intermittent bradycardia  - Please educate patient on appropriate antihypertensives  - Patient started on Metoprolol 12.5 BID to reduce peri-operative risk but was discontinued post-op - Amlodipine reduced to 2.5mg  Daily this admission (Was discontinued at admission as patient was normotensive. He will follow up with PCP) - Continue to evaluate and optimize regimen as need - Monitor for bradycardia and symptoms  Anemia - CBC to ensure stability of Hemoglobin  2.  Labs / imaging needed at time of follow-up: CBC  3.  Pending labs/ test needing follow-up: None   Follow-up Appointments: Follow-up Information    Conrad [place], MD In 3 weeks.   Specialties:  Vascular Surgery, Cardiology Why:  Office will call you to arrange your appt (sent) Contact information: [address] [place] [address] [phone]        [name], MD In 2 weeks.   Specialty:  Orthopedic Surgery Contact information: [place]  [place] by problem list:  Cellulitis S/P Left Transmetatarsal Amputation Atherosclerosis of native arteries of extremities with gangrene, left leg: Patient presented with 2-3 days of worsening ulcers and pain of his left digits following recent angiography to evaluate PAD due to these ulcers which showed significant occlusions. His wounds were noted to be necrotic on exam with foul odor with signs of overlying cellulitis. Patient initially received supportive care, pain control, and IV  antibiotics. He was evaluated by vascular surgery and orthopedics who decided to pursue revascularization and L TMA in order to avoid higher level amputation. He underwent L femoral popliteal bypass on 6/27; with adequate pulses follow this procedure. He under went successful L TMA on 06/16/2018. Antibiotic were continued for 72 hours following his TMA, based on orthopedic recommendations. Patient was discharged to rehab facility with Tylenol for pain control and 1 additional day of IV Zosyn (Until 06/19/2018). HE is to follow up with Vascular surgery and Orthopedics. Per orthopedic note, opioid pathway was ordered after L..TMA   HTN: CAD (s/p CABG)  CHF (EF 60-65%) Patient was evaluated by cardiology for risk stratification at the request of vascular surgery. He was deemed high-risk and was started on Metoprolol 12.5mg  BID for perioperative risk reduction. He was continued on his home amlodipine, but this was reduced to 2.5mg  daily given the addition of metoprolol to his regimen. His home Lasix 40mg  BID was initially held as BP was soft, but was restarted once patient was noted to have several pounds of weights gain. Lasix was discontinued at discharge when patient's kidney function was gradually improving. He was discharged on no anti-hypertensives as his BP was stable at 127/57.   Anemia: Patient was noted to have chronic anemia, which was stable a 9 on admission this did reach a nadir of 7.6 (believed to be dilutional due to IV fluids and not receiving lasix initially. He did received 2 units of pRBCs during Revascularization procedure on 6/27. Hgb remained stable between 8 and 11 following this with a slight drop following TMA procedure. Hgb 8.4 on day of discharge. Patient remained asymptomatic from his anemia throughout his hospitalization.  Discharge Vitals:   BP (!) 127/57 (BP Location: Right Arm)   Pulse 61   Temp 98.7 F (37.1 C) (Oral)   Resp 19   Ht 5\' 5"  (1.651 m)   Wt 177 lb 7.5 oz (80.5  kg)   SpO2 99%   BMI 29.53 kg/m   Pertinent Labs, Studies, and Procedures:  CBC Latest Ref Rng & Units 06/18/2018 06/17/2018 06/16/2018  WBC 4.0 - 10.5 K/uL 6.7 7.1 8.0  Hemoglobin 13.0 - 17.0 g/dL 8.4(L) 8.0(L) 8.8(L)  Hematocrit 39.0 - 52.0 % 27.3(L) 25.8(L) 28.2(L)  Platelets 150 - 400 K/uL 175 169 200   CMP Latest Ref Rng & Units 06/18/2018 06/17/2018 06/16/2018  Glucose 70 - 99 mg/dL 97 208(H) 102(H)  BUN 8 - 23 mg/dL 40(H) 47(H) 53(H)  Creatinine 0.61 - 1.24 mg/dL 2.46(H) 2.52(H) 2.71(H)  Sodium 135 - 145 mmol/L 139 137 138  Potassium 3.5 - 5.1 mmol/L 3.6 4.0 3.6  Chloride 98 - 111 mmol/L 105 104 104  CO2 22 - 32 mmol/L 25 25 25   Calcium 8.9 - 10.3 mg/dL 8.3(L) 8.1(L) 8.2(L)  Total Protein 6.5 - 8.1 g/dL - - -  Total Bilirubin 0.3 - 1.2 mg/dL - - -  Alkaline Phos 38 - 126 U/L - - -  AST  15 - 41 U/L - - -  ALT 0 - 44 U/L - - -   Admission EKG:  EKG Interpretation  Date/Time:  Monday June 07 2018 12:41:35 EDT Ventricular Rate:  93 PR Interval:    QRS Duration: 112 QT Interval:  381 QTC Calculation: 474 R Axis:   104 Text Interpretation:  Sinus rhythm Left posterior fascicular block LVH by voltage Inferior infarct, old Anterior Q waves, possibly due to LVH Baseline wander When compared with ECG of 01/08/2018 No significant change was found Confirmed by Francine Graven [phone]) on 06/07/2018 1:49:34 PM      CXR 06/07/18: IMPRESSION: Poor inspiratory effort with crowding vascular markings. No focal infiltrate is seen.  Left Common Femoral to Below Knee Popliteal bypass 06/10/18  Left Transmetatarsal Amputation 06/16/18  Discharge Instructions: Discharge Instructions    Call MD for:  difficulty breathing, headache or visual disturbances   Complete by:  As directed    Call MD for:  extreme fatigue   Complete by:  As directed    Call MD for:  hives   Complete by:  As directed    Call MD for:  persistant dizziness or light-headedness   Complete by:  As directed    Call MD for:   persistant nausea and vomiting   Complete by:  As directed    Call MD for:  redness, tenderness, or signs of infection (pain, swelling, redness, odor or green/yellow discharge around incision site)   Complete by:  As directed    Call MD for:  severe uncontrolled pain   Complete by:  As directed    Call MD for:  temperature >100.4   Complete by:  As directed    Diet Carb Modified   Complete by:  As directed    Discharge instructions   Complete by:  As directed    You were seen at the hospital for left foot gangrene and had surgery performed. You are being discharged to a skilled nursing facility   Please follow up with Orthopedic Dr. Sharol Given and Vascular Surgeon Dr. Donnetta Hutching after discharge  Please follow up with your primary care physician for management of diabetes and hypertension   Increase activity slowly   Complete by:  As directed    Leave dressing on - Keep it clean, dry, and intact until clinic visit   Complete by:  As directed    Patient will follow up with orthopedic surgeon Dr. Rosalie Gums      Signed: Jean Rosenthal, MD 06/18/2018, 12:22 PM   Pager: [phone]

## 2018-06-16 NOTE — Op Note (Signed)
06/16/2018  12:55 PM  PATIENT:  [name]    PRE-OPERATIVE DIAGNOSIS:  Gangrene Left Foot  POST-OPERATIVE DIAGNOSIS:  Same  PROCEDURE:  LEFT TRANSMETATARSAL AMPUTATION Application Praveena wound VAC  SURGEON:  [name], MD  PHYSICIAN ASSISTANT:None ANESTHESIA:   General  PREOPERATIVE INDICATIONS:  [name] is a  83 y.o. male with a diagnosis of Gangrene Left Foot who failed conservative measures and elected for surgical management.    The risks benefits and alternatives were discussed with the patient preoperatively including but not limited to the risks of infection, bleeding, nerve injury, cardiopulmonary complications, the need for revision surgery, among others, and the patient was willing to proceed.  OPERATIVE IMPLANTS: Praveena wound VAC.  @ENCIMAGES @  OPERATIVE FINDINGS: Necrotic muscle and soft tissue plantar aspect left foot  OPERATIVE PROCEDURE: Patient was brought the operating room underwent a general anesthetic.  After adequate levels of anesthesia were obtained patient's left lower extremity was prepped using DuraPrep draped into a sterile field a timeout was called.  A fishmouth incision was made proximal to the dry gangrene.  The necrotic tissue extended more proximal and this incision was carried more proximal.  A transmetatarsal amputation was performed with an oscillating saw.  The muscle and soft tissue was necrotic with abscess and this was excised.  The wound was irrigated with normal saline there was good bleeding in the remaining tissue.  The incision was closed using 2-0 nylon and a Praveena wound VAC was applied this had a good suction fit.   DISCHARGE PLANNING:  Antibiotic duration: Continue IV antibiotics for 72 hours postoperatively.  Weightbearing: Strict nonweightbearing on the left  Pain medication: Opioid pathway ordered   Dressing care/ Wound VAC: Continue the wound VAC to discharge with the Praveena plus portable wound VAC  pump  Ambulatory devices: Walker  Discharge to: Skilled nursing facility  Follow-up: In the office 1 week post operative.

## 2018-06-16 NOTE — Progress Notes (Signed)
Pharmacy Antibiotic Note  [name] is a 83 y.o. male admitted on 06/07/2018 with foot pain.  Pharmacy has been consulted for Zosyn dosing for gangrene.  Patient underwent revascularization on 6/27, and is now s/p Left transmetatarsal amputation on 7/3. Per Dr. Jess Barters OR note, to continue abx for 72h post-op.  SCr remains elevated. WBC normal, afebrile.  Plan: Zosyn 3.375g IV Q8H (4 hr inf) through 7/6 Pharmacy to sign off as no dose adjustments anticipated. If SCr worsens more, pharmacy has scope to adjust dosing.  Height: 5\' 5"  (165.1 cm) Weight: 182 lb 5.1 oz (82.7 kg) IBW/kg (Calculated) : 61.5  Temp (24hrs), Avg:97.8 F (36.6 C), Min:97.6 F (36.4 C), Max:98.1 F (36.7 C)  Recent Labs  Lab 06/12/18 0318 06/13/18 0509 06/14/18 0437 06/15/18 0256 06/16/18 0312  WBC 9.4 9.8 10.0 10.0 8.0  CREATININE 2.26* 2.45* 2.71* 2.63* 2.71*    Estimated Creatinine Clearance: 21.2 mL/min (A) (by C-G formula based on SCr of 2.71 mg/dL (H)).    No Known Allergies  Antimicrobials this admission: Zosyn 6/24>>(7/6)   Microbiology results: UCx: Ecoli, R ampicillin, Unasyn  Thank you for allowing pharmacy to be a part of this patient's care. Bralen Wiltgen D. Maresa Morash, PharmD, BCPS Clinical Pharmacist [phone] Please check AMION for all [place] numbers 06/16/2018 1:45 PM

## 2018-06-16 NOTE — Progress Notes (Addendum)
  Progress Note    06/16/2018 7:49 AM 6 Days Post-Op  Subjective:  Increase in pain in L foot overnight   Vitals:   06/15/18 1900 06/16/18 0518  BP: (!) 127/59 112/64  Pulse: (!) 56 (!) 53  Resp: 18 20  Temp: 98.1 F (36.7 C) 97.7 F (36.5 C)  SpO2: 99% 100%   Physical Exam: Cardiac:  Irregular Lungs:  Non labored Incisions:  L groin healing well; L popliteal incision healing well Extremities:  Gangrene L foot; ATA multiphasic by doppler; PTA monphasic by doppler Abdomen:  Soft Neurologic: A&O  CBC    Component Value Date/Time   WBC 8.0 06/16/2018 0312   RBC 3.04 (L) 06/16/2018 0312   HGB 8.8 (L) 06/16/2018 0312   HCT 28.2 (L) 06/16/2018 0312   PLT 200 06/16/2018 0312   MCV 92.8 06/16/2018 0312   MCH 28.9 06/16/2018 0312   MCHC 31.2 06/16/2018 0312   RDW 13.9 06/16/2018 0312   LYMPHSABS 1.3 06/07/2018 1257   MONOABS 1.4 (H) 06/07/2018 1257   EOSABS 0.2 06/07/2018 1257   BASOSABS 0.0 06/07/2018 1257    BMET    Component Value Date/Time   NA 138 06/16/2018 0312   K 3.6 06/16/2018 0312   CL 104 06/16/2018 0312   CO2 25 06/16/2018 0312   GLUCOSE 102 (H) 06/16/2018 0312   BUN 53 (H) 06/16/2018 0312   CREATININE 2.71 (H) 06/16/2018 0312   CALCIUM 8.2 (L) 06/16/2018 0312   GFRNONAA 20 (L) 06/16/2018 0312   GFRAA 24 (L) 06/16/2018 0312    INR    Component Value Date/Time   INR 1.37 06/08/2018 0049     Intake/Output Summary (Last 24 hours) at 06/16/2018 0749 Last data filed at 06/16/2018 0000 Gross per 24 hour  Intake 1174.17 ml  Output 875 ml  Net 299.17 ml     Assessment/Plan:  83 y.o. male is s/p L femoral to BK popliteal bypass with PTFE 6 Days Post-Op   Increase in pain L foot however bypass still patent with distal doppler signals intact L TMA planned today by Dr. Etter Sjogren, PA-C [facility] [phone] 06/16/2018 7:49 AM

## 2018-06-16 NOTE — Progress Notes (Signed)
Subjective: Hospital day 9  Overnight: no acute event reported. He was made NPO in preparation for left Transmetatarsal amputation   Today, Mr. Marhefka was seen at bedside. He reports that he is doing well and states of significant improvement with left foot pain. Rating pain at 2/10. He denies fevers, chills, chest pain, shortness of breath, palpitation, abdominal pain, nausea or vomiting. He is well aware of his surgery today and in good spirits.   Objective:  Vital signs in last 24 hours: Vitals:   06/15/18 0840 06/15/18 1253 06/15/18 1900 06/16/18 0518  BP: (!) 119/59 126/67 (!) 127/59 112/64  Pulse:  (!) 55 (!) 56 (!) 53  Resp:  15 18 20   Temp:  98.2 F (36.8 C) 98.1 F (36.7 C) 97.7 F (36.5 C)  TempSrc:  Oral Oral Oral  SpO2:  100% 99% 100%  Weight:    182 lb 5.1 oz (82.7 kg)  Height:       Physical Exams:  Constitutional: In no acute distress HEENT: Atraumatic, normocephalic  Cardiovascular: RRR, no murmurs, gallops, rubs Respiratory: CTABL, no wheezes, crackles, rhonchi  Abdomen: Soft, non-distended, non tender  Extremities: Left foot is warm to touch; Left 3rd, 4th, 5th metatarsals gangrenous , malodorous  Assessment/Plan:  Active Problems:   Atherosclerosis of native arteries of extremities with gangrene, left leg (HCC)   Necrotic toes (HCC)   PAD (peripheral artery disease) (HCC)   Preoperative cardiovascular examination   S/P CABG (coronary artery bypass graft)   Severe protein-calorie malnutrition ([place])   Uncontrolled type 2 diabetes mellitus with polyneuropathy ([place])  Atherosclerosis of native arteries of extremities with gangrene, left leg: Since admission, he has had worsening of left 3rd, 4th and 5th metatarsal necrosis. In addition, necrosis seems to be spreading medially to 2nd metatarsal. Upon further evaluation, there was concern for cellulitis because surrounding tissue was erythematous and warm but has since improved. He was evaluated by  Vascular surgery and was found to have occludedleft SFA, popliteal, anterior tibial and posterior tibial arteriesand is s/p LLE Fem-Pop bypass on 6/27. ATA and PTA pulses were detected by doppler on 6/28. Upon evaluation by Orthopedics, he is scheduled for a transmetatarsal amputation with application of wound vac on 06/16/18 by Dr. Sharol Given. Patient was evaluated by cardiology on 06/08/18 and considered patient high risk for surgery but noted that amputation was lower risk. It was recommended to start patient on a beta-blocker therapy for perioperative cardiovascular risk reduction.  - Scheduled for a transmetatarsal amputation with application of wound vac on 06/16/18 by Dr. Sharol Given.  - Podiatry recommends to continue antibiotics for 72 hours post op  - Oxy-apap 10-325mg  q6hr prn - Oxycodone 5mg  q4hprn for breakthrough - Dilaudid changed from 0.5mg  Q3h prn to 1mg  Q3prn severe breakthrough - Continue Zosyn3.375g IV/12.40mL/hr - Heparin5000U SQ q8 - AM CBC and BMP  DM:  - Adjusted due to scheduled TMA by ortho on 06/16/18 - Lantus (reduced) 25U BID to 15U BID - Novolog 4U TID  -Novolog Sliding scale  Anemia: -Hgb 8.8this am(was 9.1 on 06/15/18) - Asymptomatic  - Will continue to monitor  HTN: - Patients blood pressure has been stable in the 100s/50s. His pulse in the last 24 hours has ranged from 55-88 bpm and given his scheduled surgery on 06/16/18, we are holding his Amlodipine 2.5mg  PO daily and Metoprolol 12.5mg  BID  CHF (EF 60-65%): > Weight up2lbs this admission - Lasix 40mg BID(held)  - KDurg 33mEq for K of3.3this AM  CAD (s/p CABG): -  Echocardiogram: EF 55-60%; G2DD -Continue home ASA and Atrovastatin -Metoprolol 12.5 BID (held) due to HR in the 50s and scheduled surgery  - Daily ASA  UTI: -UA with large Leukocytes and many bacteria. > Ur Culture: E. Coli > 100,000 - Patient covered with zosyn, completed adequate course. Will consider weaning off after surgery  once source control is achieved  - Afebrile  Chest Pain, resolved: -Patient described intermittent sharp chest pain last only a second without associated symptoms overnight 6/27. No signs of ischemia on EKG. - No further chest pain - Continue to monitor  CKD G IV: Stable [GFR 20], Cr at baseline.(2.71 <--2.63 <-- 2.71) A-fib: Sinus rhythm here. Not on any anticoagulation at home. HLD: Continue home Atrovastatin Hypothyroidism: Continue home Synthroid 8mcg Daily Long term opioid therapy: Per database and pt, on oxy-apap 10-325 TID at home  FEN: HH, CM VTE ppx: Heparin Code Status: FULL  Dispo: Anticipated discharge pendingtransmetatarsal amputation and further management.   Jean Rosenthal, MD 06/16/2018, 7:23 AM Pager: [phone]

## 2018-06-16 NOTE — Anesthesia Preprocedure Evaluation (Signed)
Anesthesia Evaluation  Patient identified by MRN, date of birth, ID band Patient awake    Reviewed: Allergy & Precautions, NPO status , Patient's Chart, lab work & pertinent test results  Airway Mallampati: II  TM Distance: >3 FB Neck ROM: Full    Dental  (+) Missing   Pulmonary neg pulmonary ROS,    Pulmonary exam normal breath sounds clear to auscultation       Cardiovascular hypertension, Pt. on medications + CAD and + CABG (x 4 in 2010 )  Normal cardiovascular exam Rhythm:Regular Rate:Normal  ECG: SR, rate 65  ECHO: LV EF: 55% - 60%   Neuro/Psych negative neurological ROS  negative psych ROS   GI/Hepatic negative GI ROS, Neg liver ROS,   Endo/Other  diabetes, Insulin DependentHypothyroidism   Renal/GU CRFRenal disease     Musculoskeletal negative musculoskeletal ROS (+)   Abdominal   Peds  Hematology  (+) anemia , HLD   Anesthesia Other Findings Gangrene Left Foot  Reproductive/Obstetrics                             Anesthesia Physical Anesthesia Plan  ASA: III  Anesthesia Plan: General   Post-op Pain Management:    Induction: Intravenous  PONV Risk Score and Plan: 2 and Ondansetron and Treatment may vary due to age or medical condition  Airway Management Planned: LMA  Additional Equipment:   Intra-op Plan:   Post-operative Plan: Extubation in OR  Informed Consent: I have reviewed the patients History and Physical, chart, labs and discussed the procedure including the risks, benefits and alternatives for the proposed anesthesia with the patient or authorized representative who has indicated his/her understanding and acceptance.   Dental advisory given  Plan Discussed with: CRNA  Anesthesia Plan Comments: Lyla Son)        Anesthesia Quick Evaluation

## 2018-06-16 NOTE — Transfer of Care (Signed)
Immediate Anesthesia Transfer of Care Note  Patient: [name]  Procedure(s) Performed: LEFT TRANSMETATARSAL AMPUTATION (Left Foot)  Patient Location: PACU  Anesthesia Type:General  Level of Consciousness: awake and alert   Airway & Oxygen Therapy: Patient Spontanous Breathing  Post-op Assessment: Report given to RN and Post -op Vital signs reviewed and stable  Post vital signs: Reviewed and stable  Last Vitals:  Vitals Value Taken Time  BP    Temp    Pulse 57 06/16/2018 12:57 PM  Resp 12 06/16/2018 12:57 PM  SpO2 96 % 06/16/2018 12:57 PM  Vitals shown include unvalidated device data.  Last Pain:  Vitals:   06/16/18 0745  TempSrc:   PainSc: 6       Patients Stated Pain Goal: 1 (23/70/23 0172)  Complications: No apparent anesthesia complications

## 2018-06-16 NOTE — Progress Notes (Signed)
  Date: 06/16/2018  Patient name: [name]  Medical record number: [id number]  Date of birth: Jul 28, 1935   I have seen and evaluated this patient and I have discussed the plan of care with the house staff. Please see their note for complete details. I concur with their findings with the following additions/corrections: [name] was seen on morning rounds with the team.  He has been n.p.o. since midnight for his surgery today with Dr. Sharol Given for a left transmetatarsal amputation.  His pain is well controlled.  He understands he will have physical therapy postop to help determine whether he can safely return home or whether he needs to go to a SNF temporarily.  Dr. Sharol Given prefers antibiotics for 72 hours postop which we will arrange.  Victor Crews, MD 06/16/2018, 2:43 PM

## 2018-06-16 NOTE — Anesthesia Procedure Notes (Signed)
Procedure Name: LMA Insertion Date/Time: 06/16/2018 12:24 PM Performed by: Eligha Bridegroom, CRNA Pre-anesthesia Checklist: Patient identified, Emergency Drugs available, Suction available, Patient being monitored and Timeout performed Patient Re-evaluated:Patient Re-evaluated prior to induction Oxygen Delivery Method: Circle system utilized Preoxygenation: Pre-oxygenation with 100% oxygen Induction Type: IV induction LMA: LMA flexible inserted LMA Size: 4.0 Number of attempts: 1 Placement Confirmation: positive ETCO2 and breath sounds checked- equal and bilateral Tube secured with: Tape Dental Injury: Teeth and Oropharynx as per pre-operative assessment

## 2018-06-17 ENCOUNTER — Encounter (HOSPITAL_COMMUNITY): Payer: Self-pay | Admitting: Orthopedic Surgery

## 2018-06-17 DIAGNOSIS — Z89422 Acquired absence of other left toe(s): Secondary | ICD-10-CM

## 2018-06-17 DIAGNOSIS — Z978 Presence of other specified devices: Secondary | ICD-10-CM

## 2018-06-17 LAB — CBC
HCT: 25.8 % — ABNORMAL LOW (ref 39.0–52.0)
Hemoglobin: 8 g/dL — ABNORMAL LOW (ref 13.0–17.0)
MCH: 29.3 pg (ref 26.0–34.0)
MCHC: 31 g/dL (ref 30.0–36.0)
MCV: 94.5 fL (ref 78.0–100.0)
PLATELETS: 169 10*3/uL (ref 150–400)
RBC: 2.73 MIL/uL — ABNORMAL LOW (ref 4.22–5.81)
RDW: 13.9 % (ref 11.5–15.5)
WBC: 7.1 10*3/uL (ref 4.0–10.5)

## 2018-06-17 LAB — BASIC METABOLIC PANEL
Anion gap: 8 (ref 5–15)
BUN: 47 mg/dL — AB (ref 8–23)
CALCIUM: 8.1 mg/dL — AB (ref 8.9–10.3)
CO2: 25 mmol/L (ref 22–32)
Chloride: 104 mmol/L (ref 98–111)
Creatinine, Ser: 2.52 mg/dL — ABNORMAL HIGH (ref 0.61–1.24)
GFR calc Af Amer: 26 mL/min — ABNORMAL LOW (ref >60)
GFR, EST NON AFRICAN AMERICAN: 22 mL/min — AB (ref >60)
GLUCOSE: 208 mg/dL — AB (ref 70–99)
Potassium: 4 mmol/L (ref 3.5–5.1)
Sodium: 137 mmol/L (ref 135–145)

## 2018-06-17 LAB — GLUCOSE, CAPILLARY
GLUCOSE-CAPILLARY: 138 mg/dL — AB (ref 70–99)
Glucose-Capillary: 164 mg/dL — ABNORMAL HIGH (ref 70–99)
Glucose-Capillary: 133 mg/dL — ABNORMAL HIGH (ref 70–99)
Glucose-Capillary: 74 mg/dL (ref 70–99)

## 2018-06-17 MED ORDER — INSULIN GLARGINE 100 UNIT/ML ~~LOC~~ SOLN
25.0000 [IU] | Freq: Two times a day (BID) | SUBCUTANEOUS | Status: DC
  Administered 2018-06-17 – 2018-06-18 (×3): 25 [IU] via SUBCUTANEOUS
  Filled 2018-06-17 (×4): qty 0.25

## 2018-06-17 NOTE — Progress Notes (Signed)
  Date: 06/17/2018  Patient name: [name]  Medical record number: [id number]  Date of birth: July 15, 1935   I have seen and evaluated this patient and I have discussed the plan of care with the house staff. Please see their note for complete details. I concur with their findings with the following additions/corrections: [name] was seen on AM rounds. His pain is well controlled post op and has no N/V. PT agrees SNF is best suited for D/C plan and pt is not opposed so we will arrange. We are cont Zosyn for 72 hrs post op per Dr JEAD''G request. Dr Eileen Stanford adding back meds we help pre-op as appropriate. Fasting CBG high this AM but otherwise well controlled despite the [employer].   Bartholomew Crews, MD 06/17/2018, 11:57 AM

## 2018-06-17 NOTE — Progress Notes (Addendum)
[facility] [place]  Subjective  - Doing well over all.     Objective (!) 100/51 68 98.1 F (36.7 C) (Oral) (!) 23 98%  Intake/Output Summary (Last 24 hours) at 06/17/2018 0947 Last data filed at 06/17/2018 0900 Gross per 24 hour  Intake 476.56 ml  Output 1310 ml  Net -833.44 ml   Doppler PT/peroneal signals. Wound vac in place s/p TMA left LE Left groin soft with out hematoma, leg incision healing well Heart irregular Lungs non labored breathing   Assessment/Planning: 83 y.o. male is s/p L femoral to BK popliteal bypass with PTFE 7 Days Post-Op S/P left TMA by Dr. Sharol Given POD# 1  PT/OT mobility  Heparin SQ for DVT prophylaxis.  Roxy Horseman 06/17/2018 9:47 AM --  Laboratory Lab Results: Recent Labs    06/16/18 0312 06/17/18 0432  WBC 8.0 7.1  HGB 8.8* 8.0*  HCT 28.2* 25.8*  PLT 200 169   BMET Recent Labs    06/16/18 0312 06/17/18 0432  NA 138 137  K 3.6 4.0  CL 104 104  CO2 25 25  GLUCOSE 102* 208*  BUN 53* 47*  CREATININE 2.71* 2.52*  CALCIUM 8.2* 8.1*    COAG Lab Results  Component Value Date   INR 1.37 06/08/2018   INR 1.32 06/07/2018   INR 1.11 12/24/2017   No results found for: PTT  I have examined the patient, reviewed and agree with above.Palp popliteal pulse   Curt Jews, MD 06/17/2018 10:31 AM

## 2018-06-17 NOTE — Progress Notes (Signed)
Subjective: Hospital Day 10 ; POD 1  Overnight: Underwent Left Transmetatarsal Amputation with application of sound Vac  Today, [name] was seen at bedside and reports that he is doing well post op. He states his pain is well controlled. He has been able to pass flatulence. Denies nausea, vomiting, chest pain, palpitation, shortness of breath, abdominal pain.     Objective:  Vital signs in last 24 hours: Vitals:   06/16/18 2200 06/17/18 0000 06/17/18 0340 06/17/18 0350  BP: (!) 93/59 (!) 122/51 (!) 100/51   Pulse: (!) 56 68    Resp: 18 14 (!) 23   Temp:  98.4 F (36.9 C) 98.1 F (36.7 C)   TempSrc:  Oral Oral   SpO2: 96% 99% 98%   Weight:    181 lb 10.5 oz (82.4 kg)  Height:        Physical Exams:  Constitutional: In no acute distress, lying in bed HEENT: Atraumatic, normocephalic  Cardiovascular: RRR, no murmurs, gallops, rubs Respiratory: CTABL, no wheezes, crackles, rhonchi  Abdomen: Soft, non-distended, non tender Extremities: Post of dressing in place with wound vac in place. The dressing is clean with no discharge.    Assessment/Plan:  Active Problems:   Atherosclerosis of native arteries of extremities with gangrene, left leg (HCC)   Necrotic toes (HCC)   PAD (peripheral artery disease) (HCC)   Preoperative cardiovascular examination   S/P CABG (coronary artery bypass graft)   Severe protein-calorie malnutrition ([place])   Uncontrolled type 2 diabetes mellitus with polyneuropathy (HCC)   Cellulitis of left foot   Gangrene of left foot (HCC)   Atherosclerosis of native arteries of extremities with gangrene, left leg: Since admission, he has had worsening of left 3rd, 4th and 5th metatarsal necrosis. In addition, necrosis seems to be spreading medially to 2nd metatarsal. He was evaluated by Vascular surgery and was found to have occludedleft SFA, popliteal, anterior tibial and posterior tibial arteriesand is s/p LLE Fem-Pop bypass on 6/27. ATA and PTA  pulses were detected by doppler on 6/28. Upon evaluation by Orthopedics, he was scheduled for a transmetatarsal amputation with application of wound vac on 06/16/18 by Dr. Sharol Given. Patient was evaluated by cardiology on 06/08/18 and considered patient high risk for surgery but noted that amputation was lower risk. It was recommended to start patient on a beta-blocker therapy for perioperative cardiovascular risk reduction.  - S/P transmetatarsal amputation with application of wound vac on 06/16/18 by Dr. Sharol Given who recommends to continue antibiotics for 72 hours post op (7/3-7/5) - Continue Zosyn3.375g IV/12.40mL/hr (Day 2) -Dilaudidchanged from 0.5mg -1mg  Q3h prn to 1mg  Q3prnsevere breakthrough - Oxy-apap 5-325mg  q6hr prn - Oxycodone 5mg  q4hprn for breakthrough - AM CBC and BMP  DM: - Insulin regimen resumed as BMP this morning showed glucose of 208 - Lantus 25U BID - Novolog 4U TID  -Novolog Sliding scale  Anemia: -Hgb8this am from 8.8on 06/15/18 likely from blood loss during surgery  - Asymptomatic  - Will continue to monitor  HTN: - Patients blood pressure has been stable in the 100s/50s. His pulse in the last 24 hours has ranged from 53-69 bpm - BP this morning was 100/51. We will continue to hold his Amlodipine 2.5mg  PO daily and Metoprolol 12.5mg  BID  CHF (EF 60-65%): - Weight stable during this admission  - Lasix 40mg BID(held) - Will continue to monitor fluid status and renal function  CAD (s/p CABG): - Echocardiogram: EF 55-60%; G2DD -Continue home ASA and Atrovastatin -Metoprolol 12.5 BID(held) due to  HR in the 50s and low BPs - Daily ASA  UTI: -UA with large Leukocytes and many bacteria. > Ur Culture: E. Coli > 100,000 - Patient covered with zosyn, completed adequate course.  - Afebrile  Chest Pain, resolved: -Patient described intermittent sharp chest pain last only a second without associated symptoms overnight 6/27. No signs of ischemia on  EKG. - No further chest pain - Continue to monitor  CKD G IV: Stable [GFR 20], Cr at baseline.(2.52<--2.71 <--2.63) A-fib: Sinus rhythm here. Not on any anticoagulation at home. HLD: Continue home Atrovastatin Hypothyroidism: Continue home Synthroid 36mcg Daily Long term opioid therapy: Per database and pt, on oxy-apap 10-325 TID at home  FEN: HH, CM VTE ppx: Heparin Code Status: FULL  Dispo: Anticipated discharge in 1-2 days.   Jean Rosenthal, MD 06/17/2018, 9:27 AM Pager: [phone]

## 2018-06-17 NOTE — Progress Notes (Signed)
Physical Therapy Treatment/RE-evaluation Patient Details Name: [name] MRN: [id number] DOB: 04-Sep-1935 Today's Date: 06/17/2018    History of Present Illness Pt adm with lt foot gangrene. Underwent lt femoral to below knee popliteal bypass on 06/10/18. Pt to have transmetatarsal amputation on 7/3. PMH - cad, afib, dm, chf, ckd, htn, pad, cabg. Pt underwent L transmet amp on 7/3 with wound vac placement    PT Comments    Pt now with L transmet amputation and wound vac and L LE NWB. Pt requiring more assist for transfers and is unable to ambulate at this time due to L LE pain and NWB. Pt able to transfer to chair with minAx2. Pt was living home alone PTA. Pt to benefit from ST-SNF upon d/c to maximize functional return and achieve safe mod I level of function prior to transition home.  Follow Up Recommendations  SNF;Supervision/Assistance - 24 hour     Equipment Recommendations  (TBD at next venue)    Recommendations for Other Services       Precautions / Restrictions Precautions Precautions: Fall Precaution Comments: L wound vac on foot Restrictions Weight Bearing Restrictions: Yes LLE Weight Bearing: Non weight bearing    Mobility  Bed Mobility Overal bed mobility: Needs Assistance Bed Mobility: Supine to Sit     Supine to sit: Mod assist     General bed mobility comments: pt able to bring bilat LE off EOB but modA required for trunk elevation and modA x2 to scoot to EOB  Transfers Overall transfer level: Needs assistance Equipment used: Rolling walker (2 wheeled) Transfers: Sit to/from [facility] Sit to Stand: Min assist;+2 safety/equipment Stand pivot transfers: Min assist;+2 safety/equipment       General transfer comment: max directional verbal cues, pt able to maintain L LE NWB, pt initiallly trying to slide R foot but then was able to complete 3 small hops with minimal foot clearance to the chair  Ambulation/Gait              General Gait Details: unable at this time   Stairs             Wheelchair Mobility    Modified Rankin (Stroke Patients Only)       Balance Overall balance assessment: Needs assistance Sitting-balance support: Bilateral upper extremity supported;Feet supported Sitting balance-Leahy Scale: Poor Sitting balance - Comments: UE support and min A to supervision assist.  Postural control: Posterior lean Standing balance support: Bilateral upper extremity supported;No upper extremity supported Standing balance-Leahy Scale: Poor Standing balance comment: needs UE support                            Cognition Arousal/Alertness: Awake/alert Behavior During Therapy: WFL for tasks assessed/performed Overall Cognitive Status: Within Functional Limits for tasks assessed                                 General Comments: pt very guarded and cautious due to pain and very anxious regarding someone hitting his L foot      Exercises General Exercises - Lower Extremity Ankle Circles/Pumps: AROM;Both;10 reps;Supine Quad Sets: AROM;Both;10 reps;Supine Heel Slides: Both;10 reps;Supine;AAROM    General Comments General comments (skin integrity, edema, etc.): pt with L LE wound vac intact      Pertinent Vitals/Pain Pain Assessment: 0-10 Pain Score: 8  Pain Location: Left foot Pain Descriptors / Indicators: Hervey Ard  Pain Intervention(s): Limited activity within patient's tolerance    Home Living                      Prior Function            PT Goals (current goals can now be found in the care plan section) Acute Rehab PT Goals Patient Stated Goal: stop the pain PT Goal Formulation: With patient Time For Goal Achievement: 07/01/18 Potential to Achieve Goals: Good Progress towards PT goals: Progressing toward goals    Frequency    Min 3X/week      PT Plan Current plan remains appropriate    Co-evaluation              AM-PAC PT  "6 Clicks" Daily Activity  Outcome Measure  Difficulty turning over in bed (including adjusting bedclothes, sheets and blankets)?: Unable Difficulty moving from lying on back to sitting on the side of the bed? : Unable Difficulty sitting down on and standing up from a chair with arms (e.g., wheelchair, bedside commode, etc,.)?: Unable Help needed moving to and from a bed to chair (including a wheelchair)?: A Lot Help needed walking in hospital room?: A Lot Help needed climbing 3-5 steps with a railing? : Total 6 Click Score: 8    End of Session Equipment Utilized During Treatment: Gait belt Activity Tolerance: Patient tolerated treatment well Patient left: in chair;with call bell/phone within reach;with family/visitor present Nurse Communication: Mobility status PT Visit Diagnosis: Unsteadiness on feet (R26.81);Other abnormalities of gait and mobility (R26.89);Muscle weakness (generalized) (M62.81);Pain Pain - Right/Left: Left Pain - part of body: Leg     Time: 0941-1010 PT Time Calculation (min) (ACUTE ONLY): 29 min  Charges:  $Therapeutic Exercise: 8-22 mins                    G Codes:       Kittie Plater, PT, DPT Pager #: [phone] Office #: [phone]    Laporsche Hoeger M Evamae Rowen 06/17/2018, 11:30 AM

## 2018-06-18 DIAGNOSIS — R159 Full incontinence of feces: Secondary | ICD-10-CM

## 2018-06-18 DIAGNOSIS — Z9889 Other specified postprocedural states: Secondary | ICD-10-CM

## 2018-06-18 LAB — GLUCOSE, CAPILLARY
GLUCOSE-CAPILLARY: 102 mg/dL — AB (ref 70–99)
Glucose-Capillary: 117 mg/dL — ABNORMAL HIGH (ref 70–99)
Glucose-Capillary: 144 mg/dL — ABNORMAL HIGH (ref 70–99)

## 2018-06-18 LAB — BASIC METABOLIC PANEL
Anion gap: 9 (ref 5–15)
BUN: 40 mg/dL — AB (ref 8–23)
CO2: 25 mmol/L (ref 22–32)
Calcium: 8.3 mg/dL — ABNORMAL LOW (ref 8.9–10.3)
Chloride: 105 mmol/L (ref 98–111)
Creatinine, Ser: 2.46 mg/dL — ABNORMAL HIGH (ref 0.61–1.24)
GFR calc Af Amer: 27 mL/min — ABNORMAL LOW (ref >60)
GFR calc non Af Amer: 23 mL/min — ABNORMAL LOW (ref >60)
Glucose, Bld: 97 mg/dL (ref 70–99)
Potassium: 3.6 mmol/L (ref 3.5–5.1)
Sodium: 139 mmol/L (ref 135–145)

## 2018-06-18 LAB — CBC
HEMATOCRIT: 27.3 % — AB (ref 39.0–52.0)
Hemoglobin: 8.4 g/dL — ABNORMAL LOW (ref 13.0–17.0)
MCH: 28.8 pg (ref 26.0–34.0)
MCHC: 30.8 g/dL (ref 30.0–36.0)
MCV: 93.5 fL (ref 78.0–100.0)
Platelets: 175 10*3/uL (ref 150–400)
RBC: 2.92 MIL/uL — ABNORMAL LOW (ref 4.22–5.81)
RDW: 13.8 % (ref 11.5–15.5)
WBC: 6.7 10*3/uL (ref 4.0–10.5)

## 2018-06-18 MED ORDER — ACETAMINOPHEN 325 MG PO TABS: 650.0000 mg | ORAL_TABLET | Freq: Four times a day (QID) | ORAL | 0 refills | Status: DC | PRN

## 2018-06-18 MED ORDER — SENNA 8.6 MG PO TABS: 1.0000 | ORAL_TABLET | Freq: Every day | ORAL | 0 refills | Status: DC

## 2018-06-18 MED ORDER — PIPERACILLIN-TAZOBACTAM 3.375 G IVPB: 3.3750 g | Freq: Three times a day (TID) | INTRAVENOUS | 0 refills | Status: AC

## 2018-06-18 MED ORDER — INSULIN GLARGINE 100 UNIT/ML ~~LOC~~ SOLN: 25.0000 [IU] | Freq: Two times a day (BID) | SUBCUTANEOUS | 1 refills | Status: DC

## 2018-06-18 MED ORDER — OXYCODONE HCL 5 MG PO TABS: 5.0000 mg | ORAL_TABLET | Freq: Four times a day (QID) | ORAL | 0 refills | Status: DC | PRN

## 2018-06-18 MED ORDER — OXYCODONE HCL 5 MG PO TABS: 5.0000 mg | ORAL_TABLET | ORAL | 0 refills | Status: DC | PRN

## 2018-06-18 MED ORDER — OXYCODONE-ACETAMINOPHEN 5-325 MG PO TABS: 1.0000 | ORAL_TABLET | Freq: Four times a day (QID) | ORAL | 0 refills | Status: DC | PRN

## 2018-06-18 MED ORDER — ONDANSETRON HCL 4 MG PO TABS: 4.0000 mg | ORAL_TABLET | Freq: Four times a day (QID) | ORAL | 0 refills | Status: DC | PRN

## 2018-06-18 NOTE — Progress Notes (Addendum)
  Progress Note    06/18/2018 7:34 AM 2 Days Post-Op  Subjective:  Patient states he is much more comfortable since having TMA   Vitals:   06/18/18 0423 06/18/18 0500  BP: (!) 127/57   Pulse:  61  Resp:  19  Temp: 98.7 F (37.1 C)   SpO2:  99%   Physical Exam: Cardiac:  Irregular Lungs:  Non labored Incisions:  L groin and popliteal healing well; wound vac in place L foot Extremities:  L PTA and ATA by doppler Abdomen:  Soft Neurologic: A&O  CBC    Component Value Date/Time   WBC 6.7 06/18/2018 0337   RBC 2.92 (L) 06/18/2018 0337   HGB 8.4 (L) 06/18/2018 0337   HCT 27.3 (L) 06/18/2018 0337   PLT 175 06/18/2018 0337   MCV 93.5 06/18/2018 0337   MCH 28.8 06/18/2018 0337   MCHC 30.8 06/18/2018 0337   RDW 13.8 06/18/2018 0337   LYMPHSABS 1.3 06/07/2018 1257   MONOABS 1.4 (H) 06/07/2018 1257   EOSABS 0.2 06/07/2018 1257   BASOSABS 0.0 06/07/2018 1257    BMET    Component Value Date/Time   NA 139 06/18/2018 0337   K 3.6 06/18/2018 0337   CL 105 06/18/2018 0337   CO2 25 06/18/2018 0337   GLUCOSE 97 06/18/2018 0337   BUN 40 (H) 06/18/2018 0337   CREATININE 2.46 (H) 06/18/2018 0337   CALCIUM 8.3 (L) 06/18/2018 0337   GFRNONAA 23 (L) 06/18/2018 0337   GFRAA 27 (L) 06/18/2018 0337    INR    Component Value Date/Time   INR 1.37 06/08/2018 0049     Intake/Output Summary (Last 24 hours) at 06/18/2018 0734 Last data filed at 06/18/2018 0617 Gross per 24 hour  Intake 1186 ml  Output 1700 ml  Net -514 ml     Assessment/Plan:  83 y.o. male is s/p L fem-pop with subsequent L TMA 2 Days Post-Op   Patent bypass with dopplerable ATA and PTA Groin and popliteal incisions healing well Wound care L foot per Dr. Harrie Foreman for discharge from vascular standpoint   Dagoberto Ligas, PA-C [facility] [phone] 06/18/2018 7:34 AM   I have examined the patient, reviewed and agree with above.  Left groin and popliteal incision healing.  Easily  palpable popliteal graft pulse.  Will not follow over the weekend.  Dr. Bridgett Larsson will see again on Monday  Curt Jews, MD 06/18/2018 10:46 AM

## 2018-06-18 NOTE — Progress Notes (Signed)
Physical Therapy Treatment Patient Details Name: [name] MRN: [id number] DOB: 1935/06/11 Today's Date: 06/18/2018    History of Present Illness Pt adm with lt foot gangrene. Underwent lt femoral to below knee popliteal bypass on 06/10/18. Pt to have transmetatarsal amputation on 7/3. PMH - cad, afib, dm, chf, ckd, htn, pad, cabg. Pt underwent L transmet amp on 7/3 with wound vac placement    PT Comments    Patient progressing slowly, but feels ready to transfer to rehab hopefully today.  Improved bed mobility with min a needed to get up to sitting and able to pivot with still +2 A needed for safety due to balance and lines.  Remains appropriate for SNF level rehab at d/c.    Follow Up Recommendations  SNF;Supervision/Assistance - 24 hour     Equipment Recommendations  Other (comment)(TBA)    Recommendations for Other Services       Precautions / Restrictions Precautions Precautions: Fall Precaution Comments: L wound vac on foot Restrictions Weight Bearing Restrictions: Yes LLE Weight Bearing: Non weight bearing    Mobility  Bed Mobility Overal bed mobility: Needs Assistance Bed Mobility: Supine to Sit     Supine to sit: Min assist     General bed mobility comments: assist for lifting trunk, cues to scoot to EOB  Transfers Overall transfer level: Needs assistance Equipment used: Rolling walker (2 wheeled) Transfers: Sit to/from [facility] Sit to Stand: Min assist;+2 safety/equipment Stand pivot transfers: Min assist;+2 safety/equipment       General transfer comment: cues for hand placement, assist for balance to transition hands to walker, scooting pivot with R foot with RW minA +2 for safety, balance and lines  Ambulation/Gait                 Stairs             Wheelchair Mobility    Modified Rankin (Stroke Patients Only)       Balance Overall balance assessment: Needs assistance Sitting-balance support: Bilateral  upper extremity supported;Feet supported Sitting balance-Leahy Scale: Fair Sitting balance - Comments: sits unsupported at EOB   Standing balance support: Bilateral upper extremity supported Standing balance-Leahy Scale: Poor Standing balance comment: needs UE support due to L NWB                            Cognition Arousal/Alertness: Awake/alert Behavior During Therapy: WFL for tasks assessed/performed Overall Cognitive Status: Within Functional Limits for tasks assessed                                        Exercises General Exercises - Lower Extremity Ankle Circles/Pumps: AROM;Both;10 reps;Supine    General Comments General comments (skin integrity, edema, etc.): son in room      Pertinent Vitals/Pain Faces Pain Scale: Hurts little more Pain Location: Left foot Pain Descriptors / Indicators: Aching Pain Intervention(s): Monitored during session;Repositioned;Premedicated before session    Home Living                      Prior Function            PT Goals (current goals can now be found in the care plan section) Progress towards PT goals: Progressing toward goals    Frequency    Min 3X/week      PT Plan  Current plan remains appropriate    Co-evaluation              AM-PAC PT "6 Clicks" Daily Activity  Outcome Measure  Difficulty turning over in bed (including adjusting bedclothes, sheets and blankets)?: A Little Difficulty moving from lying on back to sitting on the side of the bed? : Unable Difficulty sitting down on and standing up from a chair with arms (e.g., wheelchair, bedside commode, etc,.)?: Unable Help needed moving to and from a bed to chair (including a wheelchair)?: A Lot Help needed walking in hospital room?: Total Help needed climbing 3-5 steps with a railing? : Total 6 Click Score: 9    End of Session Equipment Utilized During Treatment: Gait belt Activity Tolerance: Patient tolerated  treatment well Patient left: with call bell/phone within reach;in chair;with family/visitor present   PT Visit Diagnosis: Unsteadiness on feet (R26.81);Other abnormalities of gait and mobility (R26.89);Muscle weakness (generalized) (M62.81);Pain Pain - Right/Left: Left Pain - part of body: Leg     Time: 5189-8421 PT Time Calculation (min) (ACUTE ONLY): 18 min  Charges:  $Therapeutic Activity: 8-22 mins                    G CodesMagda Kiel, [place] [phone] 06/18/2018    Reginia Naas 06/18/2018, 11:10 AM

## 2018-06-18 NOTE — Care Management Note (Signed)
Case Management Note Marvetta Gibbons RN, BSN Unit 4E-Case Manager [phone]  Patient Details  Name: [name] MRN: [id number] Date of Birth: 05-18-1935  Subjective/Objective:  Pt admitted with gangrene s/p revascularization with left fempop bypass per vascular on 6/27- plan for return to OR on 7/3 for TMA per Dr. Sharol Given. With wound VAC placement                 Action/Plan: PTA pt lived at home, per PT/OT evals recommendation for STSNF post op- CSW following for placement needs.   Expected Discharge Date:  06/18/18               Expected Discharge Plan:  [place]  In-House Referral:  Clinical Social Work  Discharge planning Services  CM Consult  Post Acute Care Choice:  NA Choice offered to:     DME Arranged:  Vac DME Agency:  [facility]  HH Arranged:    [place] Agency:     Status of Service:  Completed, signed off  If discussed at [employer] of Stay Meetings, dates discussed:    Discharge Disposition: skilled facility   Additional Comments:  06/18/18- 1330- Siani Utke RN, CM- pt for d/c today to [place] following for placement to [facility]- pt to d/c with Prevena Plus wound VAC- pt has VAC at bedside- bedside RN to change over to [facility] prior to pt's transition to STSNF today. No further CM needs noted for transition to [employer], Romeo Rabon, RN 06/18/2018, 1:39 PM

## 2018-06-18 NOTE — Progress Notes (Signed)
  Date: 06/18/2018  Patient name: [name]  Medical record number: [id number]  Date of birth: Apr 10, 1935   I have seen and evaluated this patient and I have discussed the plan of care with the house staff. Please see their note for complete details. I concur with their findings with the following additions/corrections: Mr Kulikowski was seen on AM rounds. His brother was at bedside. His pain is well controlled. He states [facility] has a bed ready for him. Will need about another 24 hrs of zosyn. Cont wound vac. Hold anti-HTN for now and cont lower dose insulin as on carb limited diet. D/C today if bed open.  Bartholomew Crews, MD 06/18/2018, 11:17 AM

## 2018-06-18 NOTE — Progress Notes (Signed)
Clinical Social Worker facilitated patient discharge including contacting patient family and facility to confirm patient discharge plans.  Clinical information faxed to facility and family agreeable with plan.  CSW arranged ambulance transport via [facility] to [facility] .  RN to call [phone] (ask for 100 hall RN, pt will go in rm#[phone]) for report prior to discharge.  Clinical Social Worker will sign off for now as social work intervention is no longer needed. Please consult [place] again if new need arises.  Rhea Pink, MSW, [place]

## 2018-06-18 NOTE — NC FL2 (Signed)
[place] MEDICAID FL2 LEVEL OF CARE SCREENING TOOL     IDENTIFICATION  Patient Name: [name] Birthdate: Feb 25, 1935 Sex: male Admission Date (Current Location): 06/07/2018  [facility] and [place] Number:  Herbalist and Address:  [facility], [place] [address] [place], [place] [address]      Provider Number: [id number]  Attending Physician Name and Address:  Bartholomew Crews, MD  Relative Name and Phone Number:  [facility], [phone]    Current Level of Care: Hospital Recommended Level of Care: [place] Prior Approval Number:    Date Approved/Denied:   PASRR Number: [id number]  Discharge Plan: SNF    Current Diagnoses: Patient Active Problem List   Diagnosis Date Noted  . Cellulitis of left foot   . Gangrene of left foot ([place])   . Severe protein-calorie malnutrition ([place])   . Uncontrolled type 2 diabetes mellitus with polyneuropathy ([place])   . Necrotic toes ([place])   . PAD (peripheral artery disease) ([place])   . Preoperative cardiovascular examination   . S/P CABG (coronary artery bypass graft)   . Atherosclerosis of native arteries of extremities with gangrene, left leg ([place]) 06/07/2018  . Atherosclerosis of native arteries of the extremities with ulceration ([place]) 05/26/2018  . Acute respiratory failure with hypoxia ([place])   . Chronic ulcer of left foot ([place]) 01/08/2018  . Hypothyroidism 01/08/2018  . Cellulitis 12/24/2017  . Nausea and vomiting 12/23/2017  . DM type 2 causing vascular disease ([place]) 05/07/2016  . Essential hypertension, benign 05/07/2016  . Other specified hypothyroidism 05/07/2016  . Bilateral carotid bruits 03/11/2016  . Elevated troponin   . Acute on chronic diastolic CHF (congestive heart failure) ([place])   . Bacteremia 02/24/2016  . Sepsis ([place]) 02/22/2016  . Uncontrolled type 2 diabetes mellitus with stage 4 chronic kidney disease ([place]) 02/22/2016  . Demand ischemia ([place])  02/22/2016  . UTI (urinary tract infection) 02/22/2016  . Acute renal failure superimposed on stage 3 chronic kidney disease ([place])   . Pyrexia   . Arterial hypotension   . Hypoxia   . UTI (lower urinary tract infection)   . OBESITY, UNSPECIFIED 08/24/2009  . Hyperlipidemia 08/15/2009  . Essential hypertension 08/15/2009  . CAD, ARTERY BYPASS GRAFT 08/15/2009  . Atrial fibrillation ([place]) 08/15/2009    Orientation RESPIRATION BLADDER Height & Weight     Self, Time, Situation, Place  O2(2L) Continent Weight: 177 lb 7.5 oz (80.5 kg) Height:  5\' 5"  (165.1 cm)  BEHAVIORAL SYMPTOMS/MOOD NEUROLOGICAL BOWEL NUTRITION STATUS      Continent Diet(renal carb modified )  AMBULATORY STATUS COMMUNICATION OF NEEDS Skin   Limited Assist Verbally Surgical wounds(amputation)                       Personal Care Assistance Level of Assistance  Bathing, Feeding, Dressing Bathing Assistance: Limited assistance Feeding assistance: Independent Dressing Assistance: Limited assistance     Functional Limitations Info  Sight, Hearing, Speech Sight Info: Adequate Hearing Info: Adequate Speech Info: Adequate    SPECIAL CARE FACTORS FREQUENCY  PT (By licensed PT), OT (By licensed OT)     PT Frequency: 5x wk OT Frequency: 5x wk            Contractures Contractures Info: Not present    Additional Factors Info  Code Status, Allergies, Insulin Sliding Scale Code Status Info: Full code  Allergies Info: No Know Allergies   Insulin Sliding Scale Info: novolog  Current Medications (06/18/2018):  This is the current hospital active medication list Current Facility-Administered Medications  Medication Dose Route Frequency Provider Last Rate Last Dose  . 0.9 %  sodium chloride infusion   Intravenous PRN [name], MD 0 mL/hr at 06/15/18 1525    . 0.9 %  sodium chloride infusion   Intravenous Continuous [name], MD 10 mL/hr at 06/18/18 [phone]    . acetaminophen (TYLENOL) tablet  650 mg  650 mg Oral Q6H PRN [name], MD   650 mg at 06/16/18 2041   Or  . acetaminophen (TYLENOL) suppository 650 mg  650 mg Rectal Q6H PRN [name], MD      . aspirin chewable tablet 81 mg  81 mg Oral QHS [name], MD   81 mg at 06/17/18 2221  . atorvastatin (LIPITOR) tablet 40 mg  40 mg Oral QHS [name], MD   40 mg at 06/17/18 2221  . bisacodyl (DULCOLAX) suppository 10 mg  10 mg Rectal Daily PRN [name], MD      . docusate sodium (COLACE) capsule 100 mg  100 mg Oral BID [name], MD   100 mg at 06/17/18 2221  . gi cocktail (Maalox,Lidocaine,Donnatal)  30 mL Oral Daily PRN [name], MD   30 mL at 06/10/18 0105  . heparin injection 5,000 Units  5,000 Units Subcutaneous Q8H [name], MD   5,000 Units at 06/18/18 1433  . HYDROmorphone (DILAUDID) injection 0.5-1 mg  0.5-1 mg Intravenous Q3H PRN Ledell Noss, MD   0.5 mg at 06/18/18 1108  . insulin aspart (novoLOG) injection 0-15 Units  0-15 Units Subcutaneous TID WC [name], MD   2 Units at 06/17/18 1254  . insulin aspart (novoLOG) injection 4 Units  4 Units Subcutaneous TID WC [name], MD   4 Units at 06/17/18 1253  . insulin glargine (LANTUS) injection 25 Units  25 Units Subcutaneous BID Alphonzo Grieve, MD   25 Units at 06/18/18 1093  . levothyroxine (SYNTHROID, LEVOTHROID) tablet 50 mcg  50 mcg Oral QAC breakfast [name], MD   50 mcg at 06/18/18 0608  . magnesium citrate solution 1 Bottle  1 Bottle Oral Once PRN [name], MD      . methocarbamol (ROBAXIN) tablet 500 mg  500 mg Oral Q6H PRN [name], MD   500 mg at 06/16/18 1637   Or  . methocarbamol (ROBAXIN) 500 mg in dextrose 5 % 50 mL IVPB  500 mg Intravenous Q6H PRN [name], MD      . metoCLOPramide (REGLAN) tablet 5-10 mg  5-10 mg Oral Q8H PRN [name], MD       Or  . metoCLOPramide (REGLAN) injection 5-10 mg  5-10 mg Intravenous Q8H PRN [name], MD      . ondansetron [facility]) tablet 4 mg   4 mg Oral Q6H PRN [name], MD       Or  . ondansetron [facility]) injection 4 mg  4 mg Intravenous Q6H PRN [name], MD      . ondansetron [facility]) tablet 4 mg  4 mg Oral Q6H PRN [name], MD       Or  . ondansetron [facility]) injection 4 mg  4 mg Intravenous Q6H PRN [name], MD      . oxyCODONE-acetaminophen (PERCOCET/ROXICET) 5-325 MG per tablet 1 tablet  1 tablet  Oral Q6H PRN [name], MD   1 tablet at 06/18/18 1433   And  . oxyCODONE (Oxy IR/ROXICODONE) immediate release tablet 5 mg  5 mg Oral Q6H PRN [name], MD   5 mg at 06/18/18 1435  . oxyCODONE (Oxy IR/ROXICODONE) immediate release tablet 5 mg  5 mg Oral Q4H PRN [name], MD   5 mg at 06/18/18 0900  . pantoprazole (PROTONIX) EC tablet 40 mg  40 mg Oral Daily [name], MD   40 mg at 06/18/18 6213  . phenol (CHLORASEPTIC) mouth spray 1 spray  1 spray Mouth/Throat PRN [name], MD      . piperacillin-tazobactam (ZOSYN) IVPB 3.375 g  3.375 g Intravenous Q8H [name], MD 12.5 mL/hr at 06/18/18 1433 3.375 g at 06/18/18 1433  . polyethylene glycol (MIRALAX / GLYCOLAX) packet 17 g  17 g Oral Daily [name], MD   17 g at 06/17/18 0858  . polyethylene glycol (MIRALAX / GLYCOLAX) packet 17 g  17 g Oral Daily PRN [name], MD      . senna [facility]) tablet 8.6 mg  1 tablet Oral BID [name], MD   8.6 mg at 06/17/18 2221     Discharge Medications: Please see discharge summary for a list of discharge medications.  Relevant Imaging Results:  Relevant Lab Results:   Additional Information SS# [id number]  (on iv zosyn) will have [place], LCSW

## 2018-06-18 NOTE — Progress Notes (Addendum)
Subjective: Hospital Day 11; POD 2  Overnight: He has 1 episode of accidental bowel movement, non-foul smelling and no abdominal pain. The son, [name], at bedside indicates that his dad occasionally has these episodes.   Today, [name] was evaluated at bedside, His reports that his pain is table at 4/10 with appropriate pain regimen. He denies fevers, chills, palpitation, SOB, chest pain, nausea, vomiting.   Objective:  Vital signs in last 24 hours: Vitals:   06/18/18 0246 06/18/18 0400 06/18/18 0423 06/18/18 0500  BP:   (!) 127/57   Pulse:  (!) 53  61  Resp:  20  19  Temp:   98.7 F (37.1 C)   TempSrc:   Oral   SpO2:  97%  99%  Weight: 177 lb 7.5 oz (80.5 kg)     Height:       Physical Exams:  Constitutional: In no acute distress, lying in bed HEENT: Atraumatic, normocephalic  Cardiovascular: RRR, no murmurs, gallops, rubs Respiratory: CTABL, no wheezes, crackles, rhonchi  Abdomen: Soft, non-distended, non tender Extremities: Post op dressing in place with wound vac in place. The dressing is clean with no discharge.     Assessment/Plan:  Active Problems:   Atherosclerosis of native arteries of extremities with gangrene, left leg (HCC)   Necrotic toes (HCC)   PAD (peripheral artery disease) (HCC)   Preoperative cardiovascular examination   S/P CABG (coronary artery bypass graft)   Severe protein-calorie malnutrition (HCC)   Uncontrolled type 2 diabetes mellitus with polyneuropathy (HCC)   Cellulitis of left foot   Gangrene of left foot [facility])   [name] is an 83 year old male with PMHx significant for PAD, HFpEF (EF 60%-65%), CKD III, A-Fib, CAD s/p CABG, DM, HTN, HLD, Hypothyroidism and amenia who presented on 06/07/18 with left foot pain and gangrenous toes.  Atherosclerosis of native arteries of extremities with gangrene, left leg: Initially during this admission, he was having worsening of left 3rd, 4th and 5th metatarsal necrosis. In addition, necrosis  seems to be spreading medially to 2nd metatarsal. He was evaluated by Vascular surgery and was found to have occludedleft SFA, popliteal, anterior tibial and posterior tibial arteriesand is s/p LLE Fem-Pop bypass on 6/27. ATA and PTA pulses were detected by doppler on 6/28. Upon evaluation by Orthopedics, he was scheduled for a transmetatarsal amputation with application of wound vac on 06/16/18 by Dr. Sharol Given. Patient was evaluated by cardiology on 06/08/18 and considered patient high risk for surgery but noted that amputation was lower risk. It was recommended to start patient on a beta-blocker therapy for perioperative cardiovascular risk reduction.  - S/P transmetatarsal amputation with application of wound vac on 06/16/18 by Dr. Sharol Given who recommends to continue antibiotics for 72 hours post op(7/4-7/6) - Continue Zosyn3.375g IV/12.65mL/hr (Day 2) --> Last dose 0600AM on 06/19/2018 - PT: recommended SNF and patient already has a bed at [facility] from 0.5mg -1mg  Q3h prn to 1mg  Q3prnsevere breakthrough - Oxy-apap 5-325mg  q6hr prn - Oxycodone 5mg  q4hprn for breakthrough  DM, stable: - Insulin regimen resumed  - Lantus25U BID - Novolog 4U TID  -Novolog Sliding scale  Anemia, stable: -Hgb8.4this am from 8  - Asymptomatic - Will continue to monitor  HTN: - Patients blood pressure has been stable in the 120s/50s. His pulse in the last 24 hours has ranged from53-64bpm - BP this morning was 127/57. We will continue to hold his Amlodipine 2.5mg  PO daily and Metoprolol 12.5mg  BID - Will follow  up with PCP for BP control   UTI, resolved:  -UA with large Leukocytes and many bacteria. > Ur Culture: E. Coli > 100,000 - Patient covered with zosyn, completed adequatecourse.  - Afebrile  Chest Pain, resolved: -Patient described intermittent sharp chest pain last only a second without associated symptoms overnight 6/27. No signs of ischemia on EKG. -No further  chest pain - Continue to monitor  CKDGIV: Stable[GFR 20], Cr at baseline.(2.46 <--2.52<--2.71) A-fib: Sinus rhythm here. Not on any anticoagulation at home. HLD: Continue home Atrovastatin Hypothyroidism: Continue home Synthroid 33mcg Daily Long term opioid therapy: Per database and pt, on oxy-apap 10-325 TID at home  FEN: HH, CM VTE ppx: Heparin Code Status: FULL  Dispo: Anticipated discharge in 1 day.     Victor Rosenthal, MD 06/18/2018, 6:27 AM Pager: [phone]

## 2018-06-18 NOTE — NC FL2 (Signed)
[place] MEDICAID FL2 LEVEL OF CARE SCREENING TOOL     IDENTIFICATION  Patient Name: [name] Birthdate: 1935/07/26 Sex: male Admission Date (Current Location): 06/07/2018  [facility] and [place] Number:  Herbalist and Address:  [facility]. [facility], [place] [address] [place], [place] [address]      Provider Number: [id number]  Attending Physician Name and Address:  Bartholomew Crews, MD  Relative Name and Phone Number:  [facility], [phone]    Current Level of Care: Hospital Recommended Level of Care: [place] Prior Approval Number:    Date Approved/Denied:   PASRR Number: [id number]  Discharge Plan: SNF    Current Diagnoses: Patient Active Problem List   Diagnosis Date Noted  . Cellulitis of left foot   . Gangrene of left foot ([place])   . Severe protein-calorie malnutrition ([place])   . Uncontrolled type 2 diabetes mellitus with polyneuropathy ([place])   . Necrotic toes ([place])   . PAD (peripheral artery disease) ([place])   . Preoperative cardiovascular examination   . S/P CABG (coronary artery bypass graft)   . Atherosclerosis of native arteries of extremities with gangrene, left leg ([place]) 06/07/2018  . Atherosclerosis of native arteries of the extremities with ulceration ([place]) 05/26/2018  . Acute respiratory failure with hypoxia ([place])   . Chronic ulcer of left foot ([place]) 01/08/2018  . Hypothyroidism 01/08/2018  . Cellulitis 12/24/2017  . Nausea and vomiting 12/23/2017  . DM type 2 causing vascular disease ([place]) 05/07/2016  . Essential hypertension, benign 05/07/2016  . Other specified hypothyroidism 05/07/2016  . Bilateral carotid bruits 03/11/2016  . Elevated troponin   . Acute on chronic diastolic CHF (congestive heart failure) ([place])   . Bacteremia 02/24/2016  . Sepsis ([place]) 02/22/2016  . Uncontrolled type 2 diabetes mellitus with stage 4 chronic kidney disease ([place]) 02/22/2016  . Demand ischemia ([place])  02/22/2016  . UTI (urinary tract infection) 02/22/2016  . Acute renal failure superimposed on stage 3 chronic kidney disease ([place])   . Pyrexia   . Arterial hypotension   . Hypoxia   . UTI (lower urinary tract infection)   . OBESITY, UNSPECIFIED 08/24/2009  . Hyperlipidemia 08/15/2009  . Essential hypertension 08/15/2009  . CAD, ARTERY BYPASS GRAFT 08/15/2009  . Atrial fibrillation ([place]) 08/15/2009    Orientation RESPIRATION BLADDER Height & Weight     Self, Time, Situation, Place  O2(2L) Continent Weight: 177 lb 7.5 oz (80.5 kg) Height:  5\' 5"  (165.1 cm)  BEHAVIORAL SYMPTOMS/MOOD NEUROLOGICAL BOWEL NUTRITION STATUS      Continent Diet(renal carb modified )  AMBULATORY STATUS COMMUNICATION OF NEEDS Skin   Limited Assist Verbally Surgical wounds(amputation)                       Personal Care Assistance Level of Assistance  Bathing, Feeding, Dressing Bathing Assistance: Limited assistance Feeding assistance: Independent Dressing Assistance: Limited assistance     Functional Limitations Info  Sight, Hearing, Speech Sight Info: Adequate Hearing Info: Adequate Speech Info: Adequate    SPECIAL CARE FACTORS FREQUENCY  PT (By licensed PT), OT (By licensed OT)     PT Frequency: 5x wk OT Frequency: 5x wk            Contractures Contractures Info: Not present    Additional Factors Info  Code Status, Allergies, Insulin Sliding Scale Code Status Info: Full code  Allergies Info: No Know Allergies   Insulin Sliding Scale Info: novolog  Current Medications (06/18/2018):  This is the current hospital active medication list Current Facility-Administered Medications  Medication Dose Route Frequency Provider Last Rate Last Dose  . 0.9 %  sodium chloride infusion   Intravenous PRN [name], MD 0 mL/hr at 06/15/18 1525    . 0.9 %  sodium chloride infusion   Intravenous Continuous [name], MD 10 mL/hr at 06/18/18 [phone]    . acetaminophen (TYLENOL) tablet  650 mg  650 mg Oral Q6H PRN [name], MD   650 mg at 06/16/18 2041   Or  . acetaminophen (TYLENOL) suppository 650 mg  650 mg Rectal Q6H PRN [name], MD      . aspirin chewable tablet 81 mg  81 mg Oral QHS [name], MD   81 mg at 06/17/18 2221  . atorvastatin (LIPITOR) tablet 40 mg  40 mg Oral QHS [name], MD   40 mg at 06/17/18 2221  . bisacodyl (DULCOLAX) suppository 10 mg  10 mg Rectal Daily PRN [name], MD      . docusate sodium (COLACE) capsule 100 mg  100 mg Oral BID [name], MD   100 mg at 06/17/18 2221  . gi cocktail (Maalox,Lidocaine,Donnatal)  30 mL Oral Daily PRN [name], MD   30 mL at 06/10/18 0105  . heparin injection 5,000 Units  5,000 Units Subcutaneous Q8H [name], MD   5,000 Units at 06/18/18 [phone]  . HYDROmorphone (DILAUDID) injection 0.5-1 mg  0.5-1 mg Intravenous Q3H PRN Ledell Noss, MD   1 mg at 06/17/18 1441  . insulin aspart (novoLOG) injection 0-15 Units  0-15 Units Subcutaneous TID WC [name], MD   2 Units at 06/17/18 1254  . insulin aspart (novoLOG) injection 4 Units  4 Units Subcutaneous TID WC [name], MD   4 Units at 06/17/18 1253  . insulin glargine (LANTUS) injection 25 Units  25 Units Subcutaneous BID Alphonzo Grieve, MD   25 Units at 06/18/18 3664  . levothyroxine (SYNTHROID, LEVOTHROID) tablet 50 mcg  50 mcg Oral QAC breakfast [name], MD   50 mcg at 06/18/18 0608  . magnesium citrate solution 1 Bottle  1 Bottle Oral Once PRN [name], MD      . methocarbamol (ROBAXIN) tablet 500 mg  500 mg Oral Q6H PRN [name], MD   500 mg at 06/16/18 1637   Or  . methocarbamol (ROBAXIN) 500 mg in dextrose 5 % 50 mL IVPB  500 mg Intravenous Q6H PRN [name], MD      . metoCLOPramide (REGLAN) tablet 5-10 mg  5-10 mg Oral Q8H PRN [name], MD       Or  . metoCLOPramide (REGLAN) injection 5-10 mg  5-10 mg Intravenous Q8H PRN [name], MD      . ondansetron [facility]) tablet 4 mg   4 mg Oral Q6H PRN [name], MD       Or  . ondansetron [facility]) injection 4 mg  4 mg Intravenous Q6H PRN [name], MD      . ondansetron [facility]) tablet 4 mg  4 mg Oral Q6H PRN [name], MD       Or  . ondansetron [facility]) injection 4 mg  4 mg Intravenous Q6H PRN [name], MD      . oxyCODONE-acetaminophen (PERCOCET/ROXICET) 5-325 MG per tablet 1 tablet  1 tablet  Oral Q6H PRN [name], MD   1 tablet at 06/18/18 4503   And  . oxyCODONE (Oxy IR/ROXICODONE) immediate release tablet 5 mg  5 mg Oral Q6H PRN [name], MD   5 mg at 06/18/18 0615  . oxyCODONE (Oxy IR/ROXICODONE) immediate release tablet 5 mg  5 mg Oral Q4H PRN [name], MD   5 mg at 06/18/18 0900  . pantoprazole (PROTONIX) EC tablet 40 mg  40 mg Oral Daily [name], MD   40 mg at 06/18/18 8882  . phenol (CHLORASEPTIC) mouth spray 1 spray  1 spray Mouth/Throat PRN [name], MD      . piperacillin-tazobactam (ZOSYN) IVPB 3.375 g  3.375 g Intravenous Q8H [name], MD 12.5 mL/hr at 06/18/18 0609 3.375 g at 06/18/18 0609  . polyethylene glycol (MIRALAX / GLYCOLAX) packet 17 g  17 g Oral Daily [name], MD   17 g at 06/17/18 0858  . polyethylene glycol (MIRALAX / GLYCOLAX) packet 17 g  17 g Oral Daily PRN [name], MD      . senna [facility]) tablet 8.6 mg  1 tablet Oral BID [name], MD   8.6 mg at 06/17/18 2221     Discharge Medications: Please see discharge summary for a list of discharge medications.  Relevant Imaging Results:  Relevant Lab Results:   Additional Information SS# [id number]  (on iv zosyn)  Wende Neighbors, LCSW

## 2018-06-18 NOTE — Progress Notes (Signed)
Clinical Social Worker following patient for support and discharge need. Patient has a bed at [facility] and authorization through insurance has been started. At this time CSW is still awaiting insurance approval before patient can discharge.   Rhea Pink, MSW,  [place]

## 2018-06-18 NOTE — Progress Notes (Signed)
Report given to Anderson Malta, RN at [facility]; all questions were answered. Pt to discharge with peripheral IV for antibiotic administration. Facility aware. Pt's wound vac switched to [place]. Pt awaiting PTAR for transportation to facility.    Ara Kussmaul BSN, RN

## 2018-06-22 ENCOUNTER — Telehealth (INDEPENDENT_AMBULATORY_CARE_PROVIDER_SITE_OTHER): Payer: Self-pay | Admitting: Orthopedic Surgery

## 2018-06-22 NOTE — Telephone Encounter (Signed)
[facility] in [place] , calling stating that pts daughter is concerned about the dressing being changed and nurse has orders that wound vac stays in place until next ov.. Please call to verify the orders...callback [phone]

## 2018-06-22 NOTE — Telephone Encounter (Signed)
Pt is s/p a transmet amputation. I called and advised that the vac is to remain intact for 7 days which would be tomorrow. Remove and apply dry dressing change daily and keep appt for 06/30/18. To call with any questions.

## 2018-06-23 NOTE — Telephone Encounter (Signed)
Left Foot Amputation  F/u 7/17 12:15pm     Pt son left Vm would like to speak with Nurse,has questions about drain tube.

## 2018-06-23 NOTE — Telephone Encounter (Signed)
I called and sw pt's son earlier to advise that the SNF would d/c vac today ( 7 day disposable vac) and apply a dry dressing change daily until his first post op appt. To call with questions.

## 2018-06-29 NOTE — Progress Notes (Signed)
    Postoperative Visit   History of Present Illness   [name] is a 83 y.o. year old male who presents for postoperative follow-up for: L CFA to BK pop BPG w/ Propaten (06/10/18) for L foot gangrene.  Dr. Sharol Given did a L TMA on 06/16/18.    The patient's wounds are NOT healed.  L TMA was seen by Dr. Sharol Given today: ischemic skin is being managed with wound care.  Pt complaining of uncontrolled L foot pain.   For VQI Use Only   PRE-ADM LIVING: Home  AMB STATUS: Wheelchair   Physical Examination   Vitals:   06/30/18 1520  BP: 138/76  Pulse: 77  Temp: (!) 97.2 F (36.2 C)  TempSrc: [place]  SpO2: 95%  Weight: 174 lb (78.9 kg)  Height: 5\' 5"  (1.651 m)    LLE: Incisions are healing, L TMA: plantar skin looks ischemic, no frank drainage, faintly palpable DP   Medical Decision Making   [name] is a 83 y.o. year old male who presents s/p L CFA to BK pop BPG with Propaten, s/p L TMA.   The patient's bypass incisions are healing appropriately with resolution of pre-operative symptoms.  I will defer L TMA mgmt to Dr. Sharol Given.  If TMA fails to heal, pt will need to consider L BKA. Will have pt come back in 3 weeks for wound check. Thank you for allowing [place] to participate in this patient's care.   Adele Barthel, MD, FACS [facility] Office: [phone] Pager: [phone]

## 2018-06-30 ENCOUNTER — Encounter: Payer: Self-pay | Admitting: Vascular Surgery

## 2018-06-30 ENCOUNTER — Other Ambulatory Visit: Payer: Self-pay

## 2018-06-30 ENCOUNTER — Ambulatory Visit (INDEPENDENT_AMBULATORY_CARE_PROVIDER_SITE_OTHER): Payer: Medicare HMO | Admitting: Family

## 2018-06-30 ENCOUNTER — Ambulatory Visit (INDEPENDENT_AMBULATORY_CARE_PROVIDER_SITE_OTHER): Payer: Self-pay | Admitting: Vascular Surgery

## 2018-06-30 ENCOUNTER — Encounter (INDEPENDENT_AMBULATORY_CARE_PROVIDER_SITE_OTHER): Payer: Self-pay | Admitting: Family

## 2018-06-30 VITALS — BP 138/76 | HR 77 | Temp 97.2°F | Ht 65.0 in | Wt 174.0 lb

## 2018-06-30 DIAGNOSIS — I70262 Atherosclerosis of native arteries of extremities with gangrene, left leg: Secondary | ICD-10-CM

## 2018-06-30 DIAGNOSIS — Z89432 Acquired absence of left foot: Secondary | ICD-10-CM

## 2018-06-30 MED ORDER — NITROGLYCERIN 0.2 MG/HR TD PT24: 0.2000 mg | MEDICATED_PATCH | Freq: Every day | TRANSDERMAL | 12 refills | Status: DC

## 2018-06-30 MED ORDER — OXYCODONE-ACETAMINOPHEN 5-325 MG PO TABS: 1.0000 | ORAL_TABLET | ORAL | 0 refills | Status: DC | PRN

## 2018-06-30 MED ORDER — SILVER SULFADIAZINE 1 % EX CREA: 1.0000 "application " | TOPICAL_CREAM | Freq: Every day | CUTANEOUS | 0 refills | Status: DC

## 2018-06-30 NOTE — Progress Notes (Signed)
Post-Op Visit Note   Patient: [name]           Date of Birth: 1935-04-26           MRN: [id number] Visit Date: 06/30/2018 PCP: Medicine, [facility]  Chief Complaint:  Chief Complaint  Patient presents with  . Left Foot - Routine Post Op    HPI:  HPI The patient is an 83 year old gentleman seen today status post left transmetatarsal amputation. Family concerned for black area to bottom of foot. Is at [facility].   Did have fem pop by pass by Bridgett Larsson on 06/10/18.  Ortho Exam Incision is well approximated with sutures. No gaping drainage or erythema. Does have ischemic changes to plantar lateral aspect.   Does have palpable DP pulse.   Visit Diagnoses: No diagnosis found.  Plan: will begin silvadene dressings to ischemic wound. Apply nitroglycerin patch daily. nonweight bearing. Follow up in office in 2 weeks.   Follow-Up Instructions: No follow-ups on file.   Imaging: No results found.  Orders:  No orders of the defined types were placed in this encounter.  Meds ordered this encounter  Medications  . nitroGLYCERIN (NITRODUR - DOSED IN MG/24 HR) 0.2 mg/hr patch    Sig: Place 1 patch (0.2 mg total) onto the skin daily.    Dispense:  30 patch    Refill:  12     PMFS History: Patient Active Problem List   Diagnosis Date Noted  . Cellulitis of left foot   . Gangrene of left foot ([place])   . Severe protein-calorie malnutrition ([place])   . Uncontrolled type 2 diabetes mellitus with polyneuropathy ([place])   . Necrotic toes ([place])   . PAD (peripheral artery disease) ([place])   . Preoperative cardiovascular examination   . S/P CABG (coronary artery bypass graft)   . Atherosclerosis of native arteries of extremities with gangrene, left leg ([place]) 06/07/2018  . Atherosclerosis of native arteries of the extremities with ulceration ([place]) 05/26/2018  . Acute respiratory failure with hypoxia ([place])   . Chronic ulcer of left foot ([place]) 01/08/2018  . Hypothyroidism 01/08/2018    . Cellulitis 12/24/2017  . Nausea and vomiting 12/23/2017  . DM type 2 causing vascular disease ([place]) 05/07/2016  . Essential hypertension, benign 05/07/2016  . Other specified hypothyroidism 05/07/2016  . Bilateral carotid bruits 03/11/2016  . Elevated troponin   . Acute on chronic diastolic CHF (congestive heart failure) ([place])   . Bacteremia 02/24/2016  . Sepsis ([place]) 02/22/2016  . Uncontrolled type 2 diabetes mellitus with stage 4 chronic kidney disease ([place]) 02/22/2016  . Demand ischemia ([place]) 02/22/2016  . UTI (urinary tract infection) 02/22/2016  . Acute renal failure superimposed on stage 3 chronic kidney disease ([place])   . Pyrexia   . Arterial hypotension   . Hypoxia   . UTI (lower urinary tract infection)   . OBESITY, UNSPECIFIED 08/24/2009  . Hyperlipidemia 08/15/2009  . Essential hypertension 08/15/2009  . CAD, ARTERY BYPASS GRAFT 08/15/2009  . Atrial fibrillation ([place]) 08/15/2009   Past Medical History:  Diagnosis Date  . Anemia   . Carotid artery disease ([place])   . CHF (congestive heart failure) ([place])   . CKD (chronic kidney disease), stage IV ([place])   . Coronary artery disease 2010   Status post coronary artery bypass grafting  . Diabetes mellitus   . Dysrhythmia   . Foot ulcer ([place])   . Hyperlipidemia   . Hypertension   . Hypothyroidism   .  Myocardial infarction ([place]) 2010  . PAD (peripheral artery disease) ([place])   . Postoperative atrial fibrillation ([place])   . Thrombocytopenia ([place])     Family History  Problem Relation Age of Onset  . Heart attack Father   . Heart attack Brother     Past Surgical History:  Procedure Laterality Date  . AMPUTATION Left 06/16/2018   Procedure: LEFT TRANSMETATARSAL AMPUTATION;  Surgeon: [name], MD;  Location: [place];  Service: Orthopedics;  Laterality: Left;  . AORTOGRAM Left 06/03/2018   Procedure: ABDOMINAL AORTOGRAM WITH CARBON DIOXIDE LEFT LOWER EXTREMITY RUNOFF;  Surgeon: Conrad [place], MD;  Location: [place] [facility];   Service: Vascular;  Laterality: Left;  . carotid insuff    . CORONARY ARTERY BYPASS GRAFT  2010   LIMA to LAD, SVG to diagonal, SVG to circumflex, marginal, SVG to posterior descending branch.  . FEMORAL-POPLITEAL BYPASS GRAFT Left 06/10/2018   Procedure: LEFT COMMON FEMORAL TO BELOW KNEE POPLITEAL BYPASS GRAFT WITH PROPATEN;  Surgeon: Conrad [place], MD;  Location: [place];  Service: Vascular;  Laterality: Left;   Social History   Occupational History  . Not on file  Tobacco Use  . Smoking status: Never Smoker  . Smokeless tobacco: Never Used  Substance and Sexual Activity  . Alcohol use: Not Currently    Alcohol/week: 0.0 oz  . Drug use: No  . Sexual activity: Not on file

## 2018-07-01 ENCOUNTER — Telehealth (INDEPENDENT_AMBULATORY_CARE_PROVIDER_SITE_OTHER): Payer: Self-pay | Admitting: Orthopedic Surgery

## 2018-07-01 NOTE — Telephone Encounter (Signed)
Pt was evaluated in the office yesterday. Silvadene to transmet, nitro patch and non-weight bearing. Called and was not able to reach Amy. Advised to please have her return my call.

## 2018-07-01 NOTE — Telephone Encounter (Signed)
[name] called back and she needed to know the instructions for nitro patch. 0.2% apply to alternating site around wound daily. Will call with any other questions.

## 2018-07-01 NOTE — Telephone Encounter (Signed)
Amy Maness from the [facility] in [place] called left voicemail message needing clarification on the treatment order for patient's left foot. The number to contact Amy is [phone]

## 2018-07-13 ENCOUNTER — Telehealth (INDEPENDENT_AMBULATORY_CARE_PROVIDER_SITE_OTHER): Payer: Self-pay

## 2018-07-13 NOTE — Telephone Encounter (Signed)
Estill Bamberg called stating that when she changed patient's dressing, she noticed more blood than normal on his left foot. Patient has appointment on 07/14/2018, but would like to know what she needs to do until he is seen tomorrow.  Cb# is [phone].  Please advise.  Thank you.

## 2018-07-14 ENCOUNTER — Encounter (INDEPENDENT_AMBULATORY_CARE_PROVIDER_SITE_OTHER): Payer: Self-pay | Admitting: Family

## 2018-07-14 ENCOUNTER — Other Ambulatory Visit: Payer: Self-pay | Admitting: Internal Medicine

## 2018-07-14 ENCOUNTER — Ambulatory Visit (INDEPENDENT_AMBULATORY_CARE_PROVIDER_SITE_OTHER): Payer: Medicare HMO | Admitting: Orthopedic Surgery

## 2018-07-14 ENCOUNTER — Other Ambulatory Visit (INDEPENDENT_AMBULATORY_CARE_PROVIDER_SITE_OTHER): Payer: Self-pay | Admitting: Orthopedic Surgery

## 2018-07-14 DIAGNOSIS — Z89432 Acquired absence of left foot: Secondary | ICD-10-CM

## 2018-07-14 DIAGNOSIS — I96 Gangrene, not elsewhere classified: Secondary | ICD-10-CM

## 2018-07-14 DIAGNOSIS — E43 Unspecified severe protein-calorie malnutrition: Secondary | ICD-10-CM

## 2018-07-14 NOTE — Progress Notes (Signed)
Office Visit Note   Patient: [name]           Date of Birth: [birth year]           MRN: [id number] Visit Date: 07/14/2018              Requested by: Medicine, [facility] Internal [address] [place], [place] [address] PCP: Medicine, [facility]  Chief Complaint  Patient presents with  . Left Foot - Routine Post Op, Wound Check      HPI: Patient is an 83 year old gentleman who presents status post limb salvage intervention with a transmetatarsal amputation.  Patient is status post revascularization to the left lower extremity with a femoropopliteal bypass graft with Dr. Bridgett Larsson.  Patient at the time of surgery was discussed the high likelihood of revision surgery.  Patient states he understood and wished to proceed with limb salvage intervention.  At this time patient has progressive pain wound dehiscence and gangrenous changes.  Assessment & Plan: Visit Diagnoses:  1. History of transmetatarsal amputation of left foot ([place])   2. Gangrene of left foot ([place])     Plan: Patient will need to proceed with a transtibial amputation.  Risks and benefits were discussed including infection nonhealing the wound need for additional surgery.  Patient and family state they understand and wish to proceed at this time.  Patient will need to restore wound VAC postoperatively and family states that they will care for the patient at home anticipate discharge to home on Monday.  Follow-Up Instructions: Return in about 2 weeks (around 07/28/2018).   Ortho Exam  Patient is alert, oriented, no adenopathy, well-dressed, normal affect, normal respiratory effort. Examination patient's foot has a palpable dorsalis pedis pulse he has progressive ischemic changes across the transmetatarsal amputation.  The plantar skin is black and gangrenous.  There is necrotic tissue draining from the wound.  There is no ascending cellulitis.  Patient's last albumin shows severe protein caloric malnutrition with most recent  hemoglobin A1c of 8.3.  Imaging: No results found. No images are attached to the encounter.  Labs: Lab Results  Component Value Date   HGBA1C 8.3 (H) 05/31/2018   HGBA1C 10.4 (H) 12/23/2017   HGBA1C 9.6 03/26/2016   ESRSEDRATE 82 (H) 12/23/2017   CRP 17.9 (H) 12/22/2017   REPTSTATUS 06/10/2018 FINAL 06/07/2018   CULT >=100,000 COLONIES/mL ESCHERICHIA COLI (A) 06/07/2018   LABORGA ESCHERICHIA COLI (A) 06/07/2018     Lab Results  Component Value Date   ALBUMIN 1.9 (L) 06/08/2018   ALBUMIN 2.5 (L) 06/07/2018   ALBUMIN 3.6 01/09/2018    There is no height or weight on file to calculate BMI.  Orders:  No orders of the defined types were placed in this encounter.  No orders of the defined types were placed in this encounter.    Procedures: No procedures performed  Clinical Data: No additional findings.  ROS:  All other systems negative, except as noted in the HPI. Review of Systems  Objective: Vital Signs: There were no vitals taken for this visit.  Specialty Comments:  No specialty comments available.  PMFS History: Patient Active Problem List   Diagnosis Date Noted  . History of transmetatarsal amputation of left foot ([place]) 06/30/2018  . Cellulitis of left foot   . Severe protein-calorie malnutrition ([place])   . Uncontrolled type 2 diabetes mellitus with polyneuropathy ([place])   . PAD (peripheral artery disease) ([place])   . Preoperative cardiovascular examination   . S/P CABG (coronary artery  bypass graft)   . Atherosclerosis of native arteries of extremities with gangrene, left leg ([place]) 06/07/2018  . Atherosclerosis of native arteries of the extremities with ulceration ([place]) 05/26/2018  . Acute respiratory failure with hypoxia ([place])   . Hypothyroidism 01/08/2018  . Nausea and vomiting 12/23/2017  . DM type 2 causing vascular disease ([place]) 05/07/2016  . Essential hypertension, benign 05/07/2016  . Other specified hypothyroidism 05/07/2016  . Bilateral  carotid bruits 03/11/2016  . Elevated troponin   . Acute on chronic diastolic CHF (congestive heart failure) ([place])   . Bacteremia 02/24/2016  . Sepsis ([place]) 02/22/2016  . Uncontrolled type 2 diabetes mellitus with stage 4 chronic kidney disease ([place]) 02/22/2016  . Demand ischemia ([place]) 02/22/2016  . UTI (urinary tract infection) 02/22/2016  . Acute renal failure superimposed on stage 3 chronic kidney disease ([place])   . Pyrexia   . Arterial hypotension   . Hypoxia   . UTI (lower urinary tract infection)   . OBESITY, UNSPECIFIED 08/24/2009  . Hyperlipidemia 08/15/2009  . Essential hypertension 08/15/2009  . CAD, ARTERY BYPASS GRAFT 08/15/2009  . Atrial fibrillation ([place]) 08/15/2009   Past Medical History:  Diagnosis Date  . Anemia   . Carotid artery disease ([place])   . CHF (congestive heart failure) ([place])   . CKD (chronic kidney disease), stage IV ([place])   . Coronary artery disease 2010   Status post coronary artery bypass grafting  . Diabetes mellitus   . Dysrhythmia   . Foot ulcer ([place])   . Hyperlipidemia   . Hypertension   . Hypothyroidism   . Myocardial infarction ([place]) 2010  . PAD (peripheral artery disease) ([place])   . Postoperative atrial fibrillation ([place])   . Thrombocytopenia ([place])     Family History  Problem Relation Age of Onset  . Heart attack Father   . Heart attack Brother     Past Surgical History:  Procedure Laterality Date  . AMPUTATION Left 06/16/2018   Procedure: LEFT TRANSMETATARSAL AMPUTATION;  Surgeon: [name], MD;  Location: [facility];  Service: Orthopedics;  Laterality: Left;  . AORTOGRAM Left 06/03/2018   Procedure: ABDOMINAL AORTOGRAM WITH CARBON DIOXIDE LEFT LOWER EXTREMITY RUNOFF;  Surgeon: Conrad [place], MD;  Location: [facility];  Service: Vascular;  Laterality: Left;  . carotid insuff    . CORONARY ARTERY BYPASS GRAFT  2010   LIMA to LAD, SVG to diagonal, SVG to circumflex, marginal, SVG to posterior descending branch.  . FEMORAL-POPLITEAL BYPASS  GRAFT Left 06/10/2018   Procedure: LEFT COMMON FEMORAL TO BELOW KNEE POPLITEAL BYPASS GRAFT WITH PROPATEN;  Surgeon: Conrad , MD;  Location: [place];  Service: Vascular;  Laterality: Left;   Social History   Occupational History  . Not on file  Tobacco Use  . Smoking status: Never Smoker  . Smokeless tobacco: Never Used  Substance and Sexual Activity  . Alcohol use: Not Currently    Alcohol/week: 0.0 oz  . Drug use: No  . Sexual activity: Not on file

## 2018-07-14 NOTE — Telephone Encounter (Signed)
Will address today

## 2018-07-15 ENCOUNTER — Encounter (HOSPITAL_COMMUNITY): Payer: Self-pay | Admitting: *Deleted

## 2018-07-15 ENCOUNTER — Other Ambulatory Visit: Payer: Self-pay

## 2018-07-15 NOTE — Progress Notes (Signed)
Mr Whittaker asked that I speak to his grand-daughter and care giver, Elyn Peers.  Estill Bamberg asked patient questions and gave me his answers.  Patient does not complain of chest pain, does have shortness of breath with activity. Mr Lavalley has type II diabetes, Estill Bamberg reports that it has been elevated for the past couple days, 250- 320. I instructed Estill Bamberg to give patient 1/2 of Lantus dose tonioght- 12 units anda if CBG > 70 in am 12 units then also. I instructed Estill Bamberg that if CBG is greater than 300 to call pre- op desk and ask for instructions.  Patient's sliding is 70/30 Insulin.     Thank you, Jan PAT

## 2018-07-16 ENCOUNTER — Encounter (HOSPITAL_COMMUNITY)
Admission: RE | Disposition: A | Discharge status: Skilled Nursing Facility | Payer: Self-pay | Source: Ambulatory Visit | Attending: Orthopedic Surgery

## 2018-07-16 ENCOUNTER — Inpatient Hospital Stay (HOSPITAL_COMMUNITY): Payer: Medicare HMO | Admitting: Anesthesiology

## 2018-07-16 ENCOUNTER — Inpatient Hospital Stay (HOSPITAL_COMMUNITY)
Admission: RE | Admit: 2018-07-16 | Discharge: 2018-07-24 | DRG: 475 | Disposition: A | Discharge status: Skilled Nursing Facility | Payer: Medicare HMO | Source: Ambulatory Visit | Attending: Orthopedic Surgery | Admitting: Orthopedic Surgery

## 2018-07-16 ENCOUNTER — Encounter (HOSPITAL_COMMUNITY): Payer: Self-pay | Admitting: Certified Registered Nurse Anesthetist

## 2018-07-16 DIAGNOSIS — L89322 Pressure ulcer of left buttock, stage 2: Secondary | ICD-10-CM | POA: Diagnosis not present

## 2018-07-16 DIAGNOSIS — D696 Thrombocytopenia, unspecified: Secondary | ICD-10-CM | POA: Diagnosis present

## 2018-07-16 DIAGNOSIS — I509 Heart failure, unspecified: Secondary | ICD-10-CM

## 2018-07-16 DIAGNOSIS — IMO0002 Reserved for concepts with insufficient information to code with codable children: Secondary | ICD-10-CM

## 2018-07-16 DIAGNOSIS — Z951 Presence of aortocoronary bypass graft: Secondary | ICD-10-CM | POA: Diagnosis not present

## 2018-07-16 DIAGNOSIS — N183 Chronic kidney disease, stage 3 unspecified: Secondary | ICD-10-CM

## 2018-07-16 DIAGNOSIS — M1990 Unspecified osteoarthritis, unspecified site: Secondary | ICD-10-CM | POA: Diagnosis present

## 2018-07-16 DIAGNOSIS — T8781 Dehiscence of amputation stump: Principal | ICD-10-CM | POA: Diagnosis present

## 2018-07-16 DIAGNOSIS — E1122 Type 2 diabetes mellitus with diabetic chronic kidney disease: Secondary | ICD-10-CM | POA: Diagnosis present

## 2018-07-16 DIAGNOSIS — Z794 Long term (current) use of insulin: Secondary | ICD-10-CM | POA: Diagnosis not present

## 2018-07-16 DIAGNOSIS — E785 Hyperlipidemia, unspecified: Secondary | ICD-10-CM | POA: Diagnosis present

## 2018-07-16 DIAGNOSIS — E039 Hypothyroidism, unspecified: Secondary | ICD-10-CM | POA: Diagnosis present

## 2018-07-16 DIAGNOSIS — G8918 Other acute postprocedural pain: Secondary | ICD-10-CM | POA: Diagnosis not present

## 2018-07-16 DIAGNOSIS — N184 Chronic kidney disease, stage 4 (severe): Secondary | ICD-10-CM | POA: Diagnosis present

## 2018-07-16 DIAGNOSIS — I739 Peripheral vascular disease, unspecified: Secondary | ICD-10-CM | POA: Diagnosis not present

## 2018-07-16 DIAGNOSIS — I2510 Atherosclerotic heart disease of native coronary artery without angina pectoris: Secondary | ICD-10-CM | POA: Diagnosis present

## 2018-07-16 DIAGNOSIS — Z7982 Long term (current) use of aspirin: Secondary | ICD-10-CM | POA: Diagnosis not present

## 2018-07-16 DIAGNOSIS — E875 Hyperkalemia: Secondary | ICD-10-CM

## 2018-07-16 DIAGNOSIS — D62 Acute posthemorrhagic anemia: Secondary | ICD-10-CM

## 2018-07-16 DIAGNOSIS — E1165 Type 2 diabetes mellitus with hyperglycemia: Secondary | ICD-10-CM

## 2018-07-16 DIAGNOSIS — I130 Hypertensive heart and chronic kidney disease with heart failure and stage 1 through stage 4 chronic kidney disease, or unspecified chronic kidney disease: Secondary | ICD-10-CM | POA: Diagnosis present

## 2018-07-16 DIAGNOSIS — Z89512 Acquired absence of left leg below knee: Secondary | ICD-10-CM | POA: Diagnosis not present

## 2018-07-16 DIAGNOSIS — Z8249 Family history of ischemic heart disease and other diseases of the circulatory system: Secondary | ICD-10-CM | POA: Diagnosis not present

## 2018-07-16 DIAGNOSIS — I252 Old myocardial infarction: Secondary | ICD-10-CM | POA: Diagnosis not present

## 2018-07-16 DIAGNOSIS — E1152 Type 2 diabetes mellitus with diabetic peripheral angiopathy with gangrene: Secondary | ICD-10-CM | POA: Diagnosis present

## 2018-07-16 DIAGNOSIS — L8990 Pressure ulcer of unspecified site, unspecified stage: Secondary | ICD-10-CM

## 2018-07-16 DIAGNOSIS — I25810 Atherosclerosis of coronary artery bypass graft(s) without angina pectoris: Secondary | ICD-10-CM | POA: Diagnosis not present

## 2018-07-16 DIAGNOSIS — I96 Gangrene, not elsewhere classified: Secondary | ICD-10-CM

## 2018-07-16 HISTORY — DX: Dyspnea, unspecified: R06.00

## 2018-07-16 HISTORY — PX: BELOW KNEE LEG AMPUTATION: SUR23

## 2018-07-16 HISTORY — PX: AMPUTATION: SHX166

## 2018-07-16 HISTORY — DX: Unspecified osteoarthritis, unspecified site: M19.90

## 2018-07-16 HISTORY — DX: Personal history of other medical treatment: Z92.89

## 2018-07-16 LAB — BASIC METABOLIC PANEL
Anion gap: 12 (ref 5–15)
BUN: 42 mg/dL — AB (ref 8–23)
CALCIUM: 8.7 mg/dL — AB (ref 8.9–10.3)
CO2: 22 mmol/L (ref 22–32)
Chloride: 102 mmol/L (ref 98–111)
Creatinine, Ser: 2.33 mg/dL — ABNORMAL HIGH (ref 0.61–1.24)
GFR calc Af Amer: 29 mL/min — ABNORMAL LOW (ref >60)
GFR, EST NON AFRICAN AMERICAN: 25 mL/min — AB (ref >60)
Glucose, Bld: 190 mg/dL — ABNORMAL HIGH (ref 70–99)
POTASSIUM: 5.7 mmol/L — AB (ref 3.5–5.1)
Sodium: 136 mmol/L (ref 135–145)

## 2018-07-16 LAB — GLUCOSE, CAPILLARY
GLUCOSE-CAPILLARY: 196 mg/dL — AB (ref 70–99)
GLUCOSE-CAPILLARY: 159 mg/dL — AB (ref 70–99)
GLUCOSE-CAPILLARY: 173 mg/dL — AB (ref 70–99)
Glucose-Capillary: 198 mg/dL — ABNORMAL HIGH (ref 70–99)

## 2018-07-16 LAB — CBC
HEMATOCRIT: 32.6 % — AB (ref 39.0–52.0)
Hemoglobin: 10.1 g/dL — ABNORMAL LOW (ref 13.0–17.0)
MCH: 29.5 pg (ref 26.0–34.0)
MCHC: 31 g/dL (ref 30.0–36.0)
MCV: 95.3 fL (ref 78.0–100.0)
PLATELETS: 129 10*3/uL — AB (ref 150–400)
RBC: 3.42 MIL/uL — ABNORMAL LOW (ref 4.22–5.81)
RDW: 14.6 % (ref 11.5–15.5)
WBC: 9.4 10*3/uL (ref 4.0–10.5)

## 2018-07-16 SURGERY — AMPUTATION BELOW KNEE
Anesthesia: General | Laterality: Left

## 2018-07-16 MED ORDER — HYDROMORPHONE HCL 1 MG/ML IJ SOLN
0.5000 mg | INTRAMUSCULAR | Status: DC | PRN
  Administered 2018-07-16 – 2018-07-18 (×9): 1 mg via INTRAVENOUS
  Filled 2018-07-16 (×11): qty 1

## 2018-07-16 MED ORDER — ONDANSETRON HCL 4 MG/2ML IJ SOLN
INTRAMUSCULAR | Status: DC | PRN
  Administered 2018-07-16: 4 mg via INTRAVENOUS

## 2018-07-16 MED ORDER — METHOCARBAMOL 500 MG PO TABS
ORAL_TABLET | ORAL | Status: AC
Start: 2018-07-16 — End: 2018-07-16
  Administered 2018-07-16: 500 mg via ORAL
  Filled 2018-07-16: qty 1

## 2018-07-16 MED ORDER — POLYETHYLENE GLYCOL 3350 17 G PO PACK
17.0000 g | PACK | Freq: Every day | ORAL | Status: DC | PRN
  Administered 2018-07-20: 17 g via ORAL
  Filled 2018-07-16: qty 1

## 2018-07-16 MED ORDER — SODIUM CHLORIDE 0.9 % IR SOLN
Status: DC | PRN
  Administered 2018-07-16: 1000 mL

## 2018-07-16 MED ORDER — ONDANSETRON HCL 4 MG PO TABS: 4.0000 mg | ORAL_TABLET | Freq: Four times a day (QID) | ORAL | Status: DC | PRN

## 2018-07-16 MED ORDER — FENTANYL CITRATE (PF) 250 MCG/5ML IJ SOLN
INTRAMUSCULAR | Status: AC
  Filled 2018-07-16: qty 5

## 2018-07-16 MED ORDER — MAGNESIUM CITRATE PO SOLN: 1.0000 | Freq: Once | ORAL | Status: DC | PRN

## 2018-07-16 MED ORDER — CHLORHEXIDINE GLUCONATE 4 % EX LIQD: 60.0000 mL | Freq: Once | CUTANEOUS | Status: DC

## 2018-07-16 MED ORDER — POTASSIUM CHLORIDE CRYS ER 10 MEQ PO TBCR
10.0000 meq | EXTENDED_RELEASE_TABLET | Freq: Every day | ORAL | Status: DC
Start: 2018-07-16 — End: 2018-07-16

## 2018-07-16 MED ORDER — INSULIN ASPART 100 UNIT/ML ~~LOC~~ SOLN
0.0000 [IU] | Freq: Three times a day (TID) | SUBCUTANEOUS | Status: DC
  Administered 2018-07-16: 3 [IU] via SUBCUTANEOUS
  Administered 2018-07-17 (×3): 5 [IU] via SUBCUTANEOUS
  Administered 2018-07-18: 11 [IU] via SUBCUTANEOUS
  Administered 2018-07-18: 3 [IU] via SUBCUTANEOUS
  Administered 2018-07-18 – 2018-07-19 (×2): 5 [IU] via SUBCUTANEOUS
  Administered 2018-07-19: 8 [IU] via SUBCUTANEOUS
  Administered 2018-07-19: 3 [IU] via SUBCUTANEOUS
  Administered 2018-07-20 (×3): 8 [IU] via SUBCUTANEOUS
  Administered 2018-07-21: 3 [IU] via SUBCUTANEOUS
  Administered 2018-07-21: 11 [IU] via SUBCUTANEOUS
  Administered 2018-07-21: 8 [IU] via SUBCUTANEOUS
  Administered 2018-07-22 (×3): 5 [IU] via SUBCUTANEOUS
  Administered 2018-07-23: 8 [IU] via SUBCUTANEOUS

## 2018-07-16 MED ORDER — SODIUM CHLORIDE 0.9 % IV SOLN
INTRAVENOUS | Status: DC
Start: 2018-07-16 — End: 2018-07-24
  Administered 2018-07-16: 17:00:00 via INTRAVENOUS

## 2018-07-16 MED ORDER — INSULIN ASPART 100 UNIT/ML ~~LOC~~ SOLN
4.0000 [IU] | Freq: Three times a day (TID) | SUBCUTANEOUS | Status: DC
  Administered 2018-07-16 – 2018-07-23 (×20): 4 [IU] via SUBCUTANEOUS

## 2018-07-16 MED ORDER — HYDROMORPHONE HCL 1 MG/ML IJ SOLN
0.2500 mg | INTRAMUSCULAR | Status: DC | PRN
  Administered 2018-07-16 (×4): 0.5 mg via INTRAVENOUS

## 2018-07-16 MED ORDER — METOCLOPRAMIDE HCL 5 MG PO TABS: 5.0000 mg | ORAL_TABLET | Freq: Three times a day (TID) | ORAL | Status: DC | PRN

## 2018-07-16 MED ORDER — SODIUM CHLORIDE 0.9 % IV SOLN
INTRAVENOUS | Status: DC
  Administered 2018-07-16: 11:00:00 via INTRAVENOUS

## 2018-07-16 MED ORDER — OXYCODONE HCL 5 MG PO TABS
ORAL_TABLET | ORAL | Status: AC
  Administered 2018-07-16: 10 mg via ORAL
  Filled 2018-07-16: qty 2

## 2018-07-16 MED ORDER — OXYCODONE HCL 5 MG PO TABS
10.0000 mg | ORAL_TABLET | ORAL | Status: DC | PRN
  Administered 2018-07-16 – 2018-07-19 (×5): 10 mg via ORAL
  Administered 2018-07-19 – 2018-07-21 (×3): 15 mg via ORAL
  Administered 2018-07-21: 10 mg via ORAL
  Administered 2018-07-22: 15 mg via ORAL
  Administered 2018-07-22: 10 mg via ORAL
  Administered 2018-07-22: 15 mg via ORAL
  Administered 2018-07-23 – 2018-07-24 (×5): 10 mg via ORAL
  Filled 2018-07-16: qty 3
  Filled 2018-07-16 (×3): qty 2
  Filled 2018-07-16: qty 3
  Filled 2018-07-16: qty 2
  Filled 2018-07-16: qty 3
  Filled 2018-07-16 (×3): qty 2
  Filled 2018-07-16 (×2): qty 3

## 2018-07-16 MED ORDER — PROPOFOL 10 MG/ML IV BOLUS
INTRAVENOUS | Status: DC | PRN
  Administered 2018-07-16: 150 mg via INTRAVENOUS

## 2018-07-16 MED ORDER — AMLODIPINE BESYLATE 5 MG PO TABS
5.0000 mg | ORAL_TABLET | Freq: Every day | ORAL | Status: DC
  Administered 2018-07-16 – 2018-07-24 (×9): 5 mg via ORAL
  Filled 2018-07-16 (×9): qty 1

## 2018-07-16 MED ORDER — ACETAMINOPHEN 10 MG/ML IV SOLN
1000.0000 mg | Freq: Four times a day (QID) | INTRAVENOUS | Status: DC
  Administered 2018-07-16: 1000 mg via INTRAVENOUS

## 2018-07-16 MED ORDER — ONDANSETRON HCL 4 MG/2ML IJ SOLN: 4.0000 mg | Freq: Four times a day (QID) | INTRAMUSCULAR | Status: DC | PRN

## 2018-07-16 MED ORDER — ATORVASTATIN CALCIUM 80 MG PO TABS
80.0000 mg | ORAL_TABLET | Freq: Every day | ORAL | Status: DC
  Administered 2018-07-16 – 2018-07-23 (×8): 80 mg via ORAL
  Filled 2018-07-16 (×8): qty 1

## 2018-07-16 MED ORDER — CEFAZOLIN SODIUM-DEXTROSE 1-4 GM/50ML-% IV SOLN
1.0000 g | Freq: Two times a day (BID) | INTRAVENOUS | Status: AC
  Administered 2018-07-16 (×2): 1 g via INTRAVENOUS
  Filled 2018-07-16 (×2): qty 50

## 2018-07-16 MED ORDER — ASPIRIN 81 MG PO CHEW
81.0000 mg | CHEWABLE_TABLET | Freq: Every day | ORAL | Status: DC
  Administered 2018-07-16 – 2018-07-23 (×8): 81 mg via ORAL
  Filled 2018-07-16 (×8): qty 1

## 2018-07-16 MED ORDER — ACETAMINOPHEN 10 MG/ML IV SOLN
INTRAVENOUS | Status: AC
  Filled 2018-07-16: qty 100

## 2018-07-16 MED ORDER — CEFAZOLIN SODIUM-DEXTROSE 2-4 GM/100ML-% IV SOLN
2.0000 g | INTRAVENOUS | Status: AC
  Administered 2018-07-16: 2 g via INTRAVENOUS

## 2018-07-16 MED ORDER — LEVOTHYROXINE SODIUM 50 MCG PO TABS
50.0000 ug | ORAL_TABLET | Freq: Every day | ORAL | Status: DC
  Administered 2018-07-17 – 2018-07-24 (×8): 50 ug via ORAL
  Filled 2018-07-16 (×8): qty 1

## 2018-07-16 MED ORDER — HYDROMORPHONE HCL 1 MG/ML IJ SOLN
INTRAMUSCULAR | Status: AC
  Administered 2018-07-16: 0.5 mg via INTRAVENOUS
  Filled 2018-07-16: qty 1

## 2018-07-16 MED ORDER — EPHEDRINE SULFATE 50 MG/ML IJ SOLN
INTRAMUSCULAR | Status: DC | PRN
  Administered 2018-07-16: 5 mg via INTRAVENOUS

## 2018-07-16 MED ORDER — MEPERIDINE HCL 50 MG/ML IJ SOLN: 6.2500 mg | INTRAMUSCULAR | Status: DC | PRN

## 2018-07-16 MED ORDER — BISACODYL 10 MG RE SUPP: 10.0000 mg | Freq: Every day | RECTAL | Status: DC | PRN

## 2018-07-16 MED ORDER — METHOCARBAMOL 500 MG PO TABS
500.0000 mg | ORAL_TABLET | Freq: Four times a day (QID) | ORAL | Status: DC | PRN
  Administered 2018-07-16 – 2018-07-24 (×10): 500 mg via ORAL
  Filled 2018-07-16 (×9): qty 1

## 2018-07-16 MED ORDER — ENSURE ENLIVE PO LIQD
237.0000 mL | Freq: Two times a day (BID) | ORAL | Status: DC
  Administered 2018-07-17: 237 mL via ORAL

## 2018-07-16 MED ORDER — OXYCODONE HCL 5 MG PO TABS
5.0000 mg | ORAL_TABLET | ORAL | Status: DC | PRN
  Administered 2018-07-16 – 2018-07-17 (×2): 10 mg via ORAL
  Administered 2018-07-17: 5 mg via ORAL
  Administered 2018-07-18 (×3): 10 mg via ORAL
  Administered 2018-07-19: 5 mg via ORAL
  Administered 2018-07-19 – 2018-07-20 (×2): 10 mg via ORAL
  Administered 2018-07-20: 5 mg via ORAL
  Administered 2018-07-20 – 2018-07-24 (×8): 10 mg via ORAL
  Filled 2018-07-16 (×5): qty 2
  Filled 2018-07-16: qty 1
  Filled 2018-07-16 (×4): qty 2
  Filled 2018-07-16: qty 1
  Filled 2018-07-16 (×3): qty 2
  Filled 2018-07-16: qty 1
  Filled 2018-07-16 (×7): qty 2

## 2018-07-16 MED ORDER — METHOCARBAMOL 1000 MG/10ML IJ SOLN
500.0000 mg | Freq: Four times a day (QID) | INTRAVENOUS | Status: DC | PRN
  Filled 2018-07-16: qty 5

## 2018-07-16 MED ORDER — ONDANSETRON HCL 4 MG/2ML IJ SOLN
INTRAMUSCULAR | Status: AC
  Filled 2018-07-16: qty 2

## 2018-07-16 MED ORDER — CEFAZOLIN SODIUM-DEXTROSE 2-4 GM/100ML-% IV SOLN
INTRAVENOUS | Status: AC
  Filled 2018-07-16: qty 100

## 2018-07-16 MED ORDER — FUROSEMIDE 80 MG PO TABS
80.0000 mg | ORAL_TABLET | Freq: Every day | ORAL | Status: DC
  Filled 2018-07-16: qty 1

## 2018-07-16 MED ORDER — FENTANYL CITRATE (PF) 100 MCG/2ML IJ SOLN
INTRAMUSCULAR | Status: DC | PRN
  Administered 2018-07-16 (×2): 25 ug via INTRAVENOUS

## 2018-07-16 MED ORDER — LIDOCAINE HCL (CARDIAC) PF 100 MG/5ML IV SOSY
PREFILLED_SYRINGE | INTRAVENOUS | Status: DC | PRN
  Administered 2018-07-16: 100 mg via INTRAVENOUS

## 2018-07-16 MED ORDER — ACETAMINOPHEN 325 MG PO TABS
325.0000 mg | ORAL_TABLET | Freq: Four times a day (QID) | ORAL | Status: DC | PRN
  Administered 2018-07-24: 650 mg via ORAL
  Filled 2018-07-16: qty 2

## 2018-07-16 MED ORDER — ONDANSETRON HCL 4 MG/2ML IJ SOLN: 4.0000 mg | Freq: Once | INTRAMUSCULAR | Status: DC | PRN

## 2018-07-16 MED ORDER — DOCUSATE SODIUM 100 MG PO CAPS
100.0000 mg | ORAL_CAPSULE | Freq: Two times a day (BID) | ORAL | Status: DC
  Administered 2018-07-16 – 2018-07-24 (×13): 100 mg via ORAL
  Filled 2018-07-16 (×17): qty 1

## 2018-07-16 MED ORDER — INSULIN GLARGINE 100 UNIT/ML ~~LOC~~ SOLN
10.0000 [IU] | Freq: Every day | SUBCUTANEOUS | Status: DC
  Administered 2018-07-16 – 2018-07-20 (×5): 10 [IU] via SUBCUTANEOUS
  Filled 2018-07-16 (×5): qty 0.1

## 2018-07-16 MED ORDER — METOCLOPRAMIDE HCL 5 MG/ML IJ SOLN: 5.0000 mg | Freq: Three times a day (TID) | INTRAMUSCULAR | Status: DC | PRN

## 2018-07-16 SURGICAL SUPPLY — 36 items
APL SKNCLS STERI-STRIP NONHPOA (GAUZE/BANDAGES/DRESSINGS) ×4
BENZOIN TINCTURE PRP APPL 2/3 (GAUZE/BANDAGES/DRESSINGS) ×12 IMPLANT
BLADE SAW RECIP 87.9 MT (BLADE) ×3 IMPLANT
BLADE SURG 21 STRL SS (BLADE) ×3 IMPLANT
BNDG COHESIVE 6X5 TAN STRL LF (GAUZE/BANDAGES/DRESSINGS) ×4 IMPLANT
COVER SURGICAL LIGHT HANDLE (MISCELLANEOUS) ×3 IMPLANT
CUFF TOURNIQUET SINGLE 34IN LL (TOURNIQUET CUFF) IMPLANT
CUFF TOURNIQUET SINGLE 44IN (TOURNIQUET CUFF) IMPLANT
DRAPE INCISE IOBAN 66X45 STRL (DRAPES) ×2 IMPLANT
DRAPE U-SHAPE 47X51 STRL (DRAPES) ×3 IMPLANT
DRESSING PREVENA PLUS CUSTOM (GAUZE/BANDAGES/DRESSINGS) ×1 IMPLANT
DRSG PREVENA PLUS CUSTOM (GAUZE/BANDAGES/DRESSINGS) ×3
DURAPREP 26ML APPLICATOR (WOUND CARE) ×3 IMPLANT
ELECT REM PT RETURN 9FT ADLT (ELECTROSURGICAL) ×3
ELECTRODE REM PT RTRN 9FT ADLT (ELECTROSURGICAL) ×1 IMPLANT
GLOVE BIOGEL PI IND STRL 9 (GLOVE) ×1 IMPLANT
GLOVE BIOGEL PI INDICATOR 9 (GLOVE) ×2
GLOVE SURG ORTHO 9.0 STRL STRW (GLOVE) ×3 IMPLANT
GOWN STRL REUS W/ TWL XL LVL3 (GOWN DISPOSABLE) ×2 IMPLANT
GOWN STRL REUS W/TWL XL LVL3 (GOWN DISPOSABLE) ×6
KIT BASIN OR (CUSTOM PROCEDURE TRAY) ×3 IMPLANT
KIT TURNOVER KIT B (KITS) ×3 IMPLANT
MANIFOLD NEPTUNE II (INSTRUMENTS) ×3 IMPLANT
NS IRRIG 1000ML POUR BTL (IV SOLUTION) ×3 IMPLANT
PACK ORTHO EXTREMITY (CUSTOM PROCEDURE TRAY) ×3 IMPLANT
PAD ARMBOARD 7.5X6 YLW CONV (MISCELLANEOUS) ×3 IMPLANT
SPONGE LAP 18X18 X RAY DECT (DISPOSABLE) IMPLANT
STAPLER VISISTAT 35W (STAPLE) ×2 IMPLANT
STOCKINETTE IMPERVIOUS LG (DRAPES) ×3 IMPLANT
SUT SILK 2 0 (SUTURE)
SUT SILK 2-0 18XBRD TIE 12 (SUTURE) ×1 IMPLANT
SUT VIC AB 1 CTX 27 (SUTURE) ×4 IMPLANT
TOWEL OR 17X26 10 PK STRL BLUE (TOWEL DISPOSABLE) ×3 IMPLANT
TUBE CONNECTING 12'X1/4 (SUCTIONS) ×1
TUBE CONNECTING 12X1/4 (SUCTIONS) ×2 IMPLANT
YANKAUER SUCT BULB TIP NO VENT (SUCTIONS) ×3 IMPLANT

## 2018-07-16 NOTE — H&P (Signed)
[name] is an 83 y.o. male.   Chief Complaint: Progressive gangrenous changes with odor and drainage left foot status post limb salvage and HPI: Patient is an 83 year old gentleman who presents status post limb salvage intervention with a transmetatarsal amputation.  Patient is status post revascularization to the left lower extremity with a femoropopliteal bypass graft with Dr. Bridgett Larsson.  Patient at the time of surgery was discussed the high likelihood of revision surgery.  Patient states he understood and wished to proceed with limb salvage intervention.  At this time patient has progressive pain wound dehiscence and gangrenous changes.    Past Medical History:  Diagnosis Date  . Anemia   . Arthritis   . Carotid artery disease ([place])   . CHF (congestive heart failure) ([place])   . CKD (chronic kidney disease), stage IV ([place])   . Coronary artery disease 2010   Status post coronary artery bypass grafting  . Diabetes mellitus    Type II  . Dyspnea   . Dysrhythmia   . Foot ulcer ([place])   . History of blood transfusion 05/2018  . Hyperlipidemia   . Hypertension   . Hypothyroidism   . Myocardial infarction ([place]) 2010  . PAD (peripheral artery disease) ([place])   . Postoperative atrial fibrillation ([place])   . Thrombocytopenia ([place])     Past Surgical History:  Procedure Laterality Date  . AMPUTATION Left 06/16/2018   Procedure: LEFT TRANSMETATARSAL AMPUTATION;  Surgeon: [name], MD;  Location: [facility];  Service: Orthopedics;  Laterality: Left;  . AORTOGRAM Left 06/03/2018   Procedure: ABDOMINAL AORTOGRAM WITH CARBON DIOXIDE LEFT LOWER EXTREMITY RUNOFF;  Surgeon: Conrad [place]burg, MD;  Location: [facility];  Service: Vascular;  Laterality: Left;  . carotid insuff    . CORONARY ARTERY BYPASS GRAFT  2010   LIMA to LAD, SVG to diagonal, SVG to circumflex, marginal, SVG to posterior descending branch.  . FEMORAL-POPLITEAL BYPASS GRAFT Left 06/10/2018   Procedure: LEFT COMMON FEMORAL TO BELOW KNEE  POPLITEAL BYPASS GRAFT WITH PROPATEN;  Surgeon: Conrad [place]ing[place], MD;  Location: [facility] OR;  Service: Vascular;  Laterality: Left;    Family History  Problem Relation Age of Onset  . Heart attack Father   . Heart attack Brother    Social History:  reports that he has never smoked. He has never used smokeless tobacco. He reports that he drank alcohol. He reports that he does not use drugs.  Allergies: No Known Allergies  No medications prior to admission.    No results found for this or any previous visit (from the past 48 hour(s)). No results found.  Review of Systems  All other systems reviewed and are negative.   There were no vitals taken for this visit. Physical Exam  Patient is alert, oriented, no adenopathy, well-dressed, normal affect, normal respiratory effort. Examination patient's foot has a palpable dorsalis pedis pulse he has progressive ischemic changes across the transmetatarsal amputation.  The plantar skin is black and gangrenous.  There is necrotic tissue draining from the wound.  There is no ascending cellulitis.  Patient's last albumin shows severe protein caloric malnutrition with most recent hemoglobin A1c of 8.3.    Assessment/Plan 1. History of transmetatarsal amputation of left foot ([place])   2. Gangrene of left foot ([place])     Plan: Patient will need to proceed with a transtibial amputation.  Risks and benefits were discussed including infection nonhealing the wound need for additional surgery.  Patient and family state  they understand and wish to proceed at this time.  Patient will need to restore wound VAC postoperatively and family states that they will care for the patient at home anticipate discharge to home on Monday.      [name], MD 07/16/2018, 7:37 AM

## 2018-07-16 NOTE — Anesthesia Procedure Notes (Signed)
Procedure Name: LMA Insertion Date/Time: 07/16/2018 12:37 PM Performed by: Karder Goodin T, CRNA Pre-anesthesia Checklist: Patient identified, Emergency Drugs available, Suction available and Patient being monitored Patient Re-evaluated:Patient Re-evaluated prior to induction Oxygen Delivery Method: Circle system utilized Preoxygenation: Pre-oxygenation with 100% oxygen Induction Type: IV induction LMA Size: 4.0 Dental Injury: Teeth and Oropharynx as per pre-operative assessment

## 2018-07-16 NOTE — Op Note (Signed)
   Date of Surgery: 07/16/2018  INDICATIONS: Mr. Pautz is a 83 y.o.-year-old male who is status post revascularization to the left lower extremity and a transmetatarsal amputation for attempted foot salvage.  Patient has had progressive dehiscence and gangrenous changes of the left foot amputation and presents at this time for transtibial amputation.Marland Kitchen  PREOPERATIVE DIAGNOSIS: Gangrene left lower extremity status post foot salvage intervention status post revascularization.  POSTOPERATIVE DIAGNOSIS: Same.  PROCEDURE: Transtibial amputation Application of Prevena wound VAC  SURGEON: Sharol Given, M.D.  ANESTHESIA:  general  IV FLUIDS AND URINE: See anesthesia.  ESTIMATED BLOOD LOSS: Minimal mL.  COMPLICATIONS: None.  DESCRIPTION OF PROCEDURE: The patient was brought to the operating room and underwent a general anesthetic. After adequate levels of anesthesia were obtained patient's lower extremity was prepped using DuraPrep draped into a sterile field. A timeout was called. The foot was draped out of the sterile field with impervious stockinette. A transverse incision was made 11 cm distal to the tibial tubercle. This curved proximally and a large posterior flap was created. The tibia was transected 1 cm proximal to the skin incision. The fibula was transected just proximal to the tibial incision. The tibia was beveled anteriorly. A large posterior flap was created. The sciatic nerve was pulled cut and allowed to retract. The vascular bundles were suture ligated with 2-0 silk. The deep and superficial fascial layers were closed using #1 Vicryl. The skin was closed using staples and 2-0 nylon. The wound was covered with a Prevena wound VAC. There was a good suction fit. A prosthetic shrinker was applied. Patient was extubated taken to the PACU in stable condition.   DISCHARGE PLANNING:  Antibiotic duration: 24 hours  Weightbearing: Nonweightbearing on the left  Pain medication: High-dose opiate  pathway  Dressing care/ Wound VAC: Continue wound VAC for 1 week after discharge  Discharge to: Skilled nursing facility  Follow-up: In the office 1 week post operative.  Meridee Score, MD [place] 1:07 PM

## 2018-07-16 NOTE — Anesthesia Preprocedure Evaluation (Addendum)
Anesthesia Evaluation  Patient identified by MRN, date of birth, ID band Patient awake    Reviewed: Allergy & Precautions, NPO status , Patient's Chart, lab work & pertinent test results  Airway Mallampati: I  TM Distance: >3 FB Neck ROM: Full    Dental  (+) Dental Advisory Given   Pulmonary    Pulmonary exam normal        Cardiovascular hypertension, Pt. on medications + CAD, + Past MI and + CABG  Normal cardiovascular exam+ dysrhythmias Atrial Fibrillation      Neuro/Psych    GI/Hepatic   Endo/Other  diabetes, Type 2, Insulin Dependent  Renal/GU Renal InsufficiencyRenal disease     Musculoskeletal   Abdominal   Peds  Hematology   Anesthesia Other Findings   Reproductive/Obstetrics                            Anesthesia Physical Anesthesia Plan  ASA: III  Anesthesia Plan: General   Post-op Pain Management:    Induction: Intravenous  PONV Risk Score and Plan: 2  Airway Management Planned: LMA  Additional Equipment:   Intra-op Plan:   Post-operative Plan: Extubation in OR  Informed Consent: I have reviewed the patients History and Physical, chart, labs and discussed the procedure including the risks, benefits and alternatives for the proposed anesthesia with the patient or authorized representative who has indicated his/her understanding and acceptance.     Plan Discussed with: CRNA and Surgeon  Anesthesia Plan Comments:         Anesthesia Quick Evaluation

## 2018-07-16 NOTE — Progress Notes (Signed)
Orthopedic Tech Progress Note Patient Details:  [name] 1934-12-18 [id number]  Patient ID: [name], male   DOB: [birth year], 83 y.o.   MRN: [id number]   [name] 07/16/2018, 5:35 PMCalled [employer] for left stump shrinker and limb protector.

## 2018-07-16 NOTE — Transfer of Care (Signed)
Immediate Anesthesia Transfer of Care Note  Patient: [name]  Procedure(s) Performed: LEFT BELOW KNEE AMPUTATION (Left )  Patient Location: PACU  Anesthesia Type:General  Level of Consciousness: awake, alert  and oriented  Airway & Oxygen Therapy: Patient Spontanous Breathing and Patient connected to nasal cannula oxygen  Post-op Assessment: Report given to RN and Post -op Vital signs reviewed and stable  Post vital signs: Reviewed and stable  Last Vitals:  Vitals Value Taken Time  BP 127/116 07/16/2018  1:04 PM  Temp    Pulse 65 07/16/2018  1:06 PM  Resp 16 07/16/2018  1:06 PM  SpO2 100 % 07/16/2018  1:06 PM  Vitals shown include unvalidated device data.  Last Pain:  Vitals:   07/16/18 1016  TempSrc: Oral      Patients Stated Pain Goal: 3 (47/39/58 4417)  Complications: No apparent anesthesia complications

## 2018-07-17 LAB — GLUCOSE, CAPILLARY
GLUCOSE-CAPILLARY: 202 mg/dL — AB (ref 70–99)
Glucose-Capillary: 245 mg/dL — ABNORMAL HIGH (ref 70–99)
Glucose-Capillary: 248 mg/dL — ABNORMAL HIGH (ref 70–99)
Glucose-Capillary: 203 mg/dL — ABNORMAL HIGH (ref 70–99)

## 2018-07-17 MED ORDER — ADULT MULTIVITAMIN W/MINERALS CH
1.0000 | ORAL_TABLET | Freq: Every day | ORAL | Status: DC
  Administered 2018-07-17 – 2018-07-24 (×8): 1 via ORAL
  Filled 2018-07-17 (×8): qty 1

## 2018-07-17 MED ORDER — PRO-STAT SUGAR FREE PO LIQD
30.0000 mL | Freq: Three times a day (TID) | ORAL | Status: DC
  Administered 2018-07-17 – 2018-07-24 (×18): 30 mL via ORAL
  Filled 2018-07-17 (×16): qty 30

## 2018-07-17 NOTE — Progress Notes (Signed)
Patient ID: [name], male   DOB: 1935/03/16, 83 y.o.   MRN: [id number] Patient is postoperative day 1 transtibial amputation on the left.  The wound VAC has a good suction fit currently has a restore VAC.  [employer] to follow there is no drainage in the wound VAC canister plan for discharge to skilled nursing.

## 2018-07-17 NOTE — Progress Notes (Addendum)
Initial Nutrition Assessment  DOCUMENTATION CODES:  Obesity unspecified  INTERVENTION:  D/C ensure, change to Will order 30 mL Prostat TID, each supplement provides 100 kcal and 15 grams of protein.  Gave pt/family general education for wound healing diet->Control both bg and increase Protein  Reviewed how to utilize menu  MVI with minerals.   NUTRITION DIAGNOSIS:  Increased nutrient needs related to wound healing as evidenced by estimated nutritional requirements for this condition  GOAL:  Patient will meet greater than or equal to 90% of their needs  MONITOR:  PO intake, Supplement acceptance, Diet advancement, Labs, Weight trends  REASON FOR ASSESSMENT:  Malnutrition Screening Tool    ASSESSMENT:  83 y/o male PMHx HF, CKD4, DM2, CAD s/p CABG, PAD, AFIB, MI, HTN/HLD. Presented with progressive gangrenous changes w/ odor and drainage to L foot. Failed limb salvage attempts. 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   Patient seen with his daughter, grandson and pastor in room. He reports that he "just has not had any appetite" for about the past month. He eats 2-3 meals each day, but feels these portions are not large.An example, he may have a couple pieces of bacon and a egg for breakfast and then a small sandwich for lunch.  At baseline, he does not take any oral nutritional supplements, vitamins, or minerals. He admits to following a liberal diet. While he does "not eat sweets", He has a large regular pepsi bottle on his bedside tray. He says his BGs at home run from 150-220 and he manages this with sliding scale humalog.  He says he has lost 15-20 lbs, but does not provide timeframe. Per chart, patient weighed ~197-203 this past January, falling to 180 in June and now has presented at a weigh of 172 lbs. His loss of~13% bw in 7 months is consistent with a clinically significant rate of loss.   Current bed weight is actually prior than his pre-BKA weight, likely r/t swelling.   RD reviewed  importance of BOTH bg management and appropriate protein intake for wound healing. If he must, he should think of food as medicine. RD did not recommend he consume regular sodas at this time. RD asked him to prioritize the protein on his trays. BGs currently >200 and K is 5.7. Will d/c ensure and start PROSTAT to meet protein needs and minimize sugar/K intake.   Reviewed how to utilize menu. Noted to family they are welcome to bring in food from outside, but prefer it to be High pro/lower carb. Will add mvi given poor intake w/o supplementation PTA  Labs: BG 160-245, BUN/Creat:42/2.33, K:5.7 Meds: Colace, Ensure, Novolog, IVF  NUTRITION - FOCUSED PHYSICAL EXAM:   Most Recent Value  Orbital Region  No depletion  Upper Arm Region  No depletion  Thoracic and Lumbar Region  No depletion  Buccal Region  No depletion  Temple Region  No depletion  Clavicle Bone Region  No depletion  Clavicle and Acromion Bone Region  No depletion  Scapular Bone Region  Unable to assess  Dorsal Hand  No depletion  Patellar Region  No depletion  Anterior Thigh Region  No depletion  Posterior Calf Region  No depletion  Edema (RD Assessment)  None  Hair  Reviewed  Eyes  Reviewed  Mouth  Reviewed  Skin  Reviewed  Nails  Reviewed     Diet Order:   Diet Order           Diet Carb Modified Fluid consistency: Thin; Room service appropriate?  Yes  Diet effective now         EDUCATION NEEDS:  Education needs have been addressed  Skin:  New L bka, Wound Vac in place.   Last BM:  8/1  Height:  Ht Readings from Last 1 Encounters:  07/17/18 5\' 5"  (1.651 m)   Weight:  Wt Readings from Last 1 Encounters:  07/17/18 171 lb 15.3 oz (78 kg)   Wt Readings from Last 10 Encounters:  07/17/18 171 lb 15.3 oz (78 kg)  06/30/18 174 lb (78.9 kg)  06/18/18 177 lb 7.5 oz (80.5 kg)  06/03/18 191 lb (86.6 kg)  05/26/18 191 lb (86.6 kg)  01/10/18 192 lb 8 oz (87.3 kg)  01/08/18 208 lb (94.3 kg)  12/24/17 203 lb 14.8  oz (92.5 kg)  08/01/16 200 lb (90.7 kg)  05/07/16 214 lb (97.1 kg)   Ideal Body Weight:  57.8 kg(Adjusted for BKA)  BMI:  Adjusted Body mass index is 30.6 kg/m.  Estimated Nutritional Needs:  Kcal:  1800-1950 kcals (23-25 kcal/kg bw) Protein:  87-104 g (1.5-1.8g/kg ibw) Fluid:  1.8-2 L fluid (81ml/kcal)  Burtis Junes RD, LDN, CNSC Clinical Nutrition Available Tues-Sat via Pager: [phone] 07/17/2018 11:46 AM

## 2018-07-17 NOTE — Plan of Care (Signed)

## 2018-07-17 NOTE — Anesthesia Postprocedure Evaluation (Signed)
Anesthesia Post Note  Patient: Mihran Lebarron Wedig  Procedure(s) Performed: LEFT BELOW KNEE AMPUTATION (Left )     Patient location during evaluation: PACU Anesthesia Type: General Level of consciousness: awake and alert Pain management: pain level controlled Vital Signs Assessment: post-procedure vital signs reviewed and stable Respiratory status: spontaneous breathing, nonlabored ventilation, respiratory function stable and patient connected to nasal cannula oxygen Cardiovascular status: blood pressure returned to baseline and stable Postop Assessment: no apparent nausea or vomiting Anesthetic complications: no    Last Vitals:  Vitals:   07/17/18 2034 07/17/18 2038  BP: (!) 161/56 (!) 169/63  Pulse: 62 61  Resp: 16   Temp: 37.4 C   SpO2: 100% 100%    Last Pain:  Vitals:   07/17/18 2137  TempSrc:   PainSc: 0-No pain                 Tyanna Hach DAVID

## 2018-07-17 NOTE — Progress Notes (Signed)
Wrong patient, I am sorry.

## 2018-07-17 NOTE — Evaluation (Signed)
Physical Therapy Evaluation Patient Details Name: [name] MRN: [id number] DOB: 10-06-1935 Today's Date: 07/17/2018   History of Present Illness  Pt is a 83 y.o. M with significant PMH of CAD, CHF, CKD, DM, and MI. 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  Clinical Impression  Pt admitted with above diagnosis. Pt currently with functional limitations due to the deficits listed below (see PT Problem List). At baseline, patient is independent with ADL's and transfers into wheelchair; mainly uses wheelchair for mobility. Currently, patient evaluation very limited by pain. Requiring maximal assistance to perform low pivot to the right from bed to chair. Displaying generalized weakness in addition to gross L residual limb weakness, decreased range of motion, and decreased activity tolerance. Highly recommend SNF at time of discharge to maximize functional independence and decrease caregiver burden. Pt daughter in agreement with recommendation. Patient may benefit from trialing slideboard transfer for next session. Pt will benefit from skilled PT to increase their independence and safety with mobility to allow discharge to the venue listed below.       Follow Up Recommendations SNF    Equipment Recommendations  Other (comment)(defer )    Recommendations for Other Services OT consult     Precautions / Restrictions Precautions Precautions: Fall Precaution Comments: wound vac Restrictions Weight Bearing Restrictions: Yes LLE Weight Bearing: Non weight bearing      Mobility  Bed Mobility Overal bed mobility: Needs Assistance Bed Mobility: Supine to Sit     Supine to sit: Max assist;+2 for physical assistance     General bed mobility comments: Patient able to progress legs over to edge of bed minimally. Max assistance with use of bed pad to bring hips to edge of bed and elevate trunk  Transfers Overall transfer level: Needs assistance   Transfers: Squat Pivot Transfers      Squat pivot transfers: Max assist     General transfer comment: Max assist for low pivot to right, while blocking right knee. Required multiple scoots. Cues for hand placement for efficient push off but patient minimally using BUE's.  Ambulation/Gait                Stairs            Wheelchair Mobility    Modified Rankin (Stroke Patients Only)       Balance Overall balance assessment: Needs assistance Sitting-balance support: Bilateral upper extremity supported;Feet unsupported Sitting balance-Leahy Scale: Fair                                       Pertinent Vitals/Pain Pain Assessment: Faces Faces Pain Scale: Hurts whole lot Pain Location: left residual limb Pain Descriptors / Indicators: Grimacing;Moaning;Discomfort Pain Intervention(s): Limited activity within patient's tolerance;Monitored during session;Premedicated before session    Home Living Family/patient expects to be discharged to:: Private residence Living Arrangements: Other relatives(granddaughter) Available Help at Discharge: Family;Available 24 hours/day Type of Home: House Home Access: Level entry     Home Layout: One level Home Equipment: Walker - 2 wheels;Wheelchair - manual      Prior Function Level of Independence: Independent with assistive device(s)         Comments: Independent with transfers and uses wheelchair for mobility. Indep with ADL's     Hand Dominance   Dominant Hand: Right    Extremity/Trunk Assessment   Upper Extremity Assessment Upper Extremity Assessment: Generalized weakness    Lower  Extremity Assessment Lower Extremity Assessment: Generalized weakness;LLE deficits/detail LLE Deficits / Details: s/p L BKA. grossly 2/5, limited by pain LLE: Unable to fully assess due to pain       Communication   Communication: No difficulties  Cognition Arousal/Alertness: Awake/alert Behavior During Therapy: WFL for tasks  assessed/performed Overall Cognitive Status: Within Functional Limits for tasks assessed                                        General Comments General comments (skin integrity, edema, etc.): patient family in room    Exercises Amputee Exercises Quad Sets: Left;10 reps;Supine   Assessment/Plan    PT Assessment Patient needs continued PT services  PT Problem List Decreased strength;Decreased range of motion;Decreased activity tolerance;Decreased balance;Decreased mobility;Pain       PT Treatment Interventions DME instruction;Gait training;Functional mobility training;Therapeutic activities;Therapeutic exercise;Balance training;Patient/family education;Wheelchair mobility training    PT Goals (Current goals can be found in the Care Plan section)  Acute Rehab PT Goals Patient Stated Goal: get a prosthetic PT Goal Formulation: With patient Time For Goal Achievement: 07/31/18 Potential to Achieve Goals: Fair    Frequency Min 2X/week   Barriers to discharge        Co-evaluation               AM-PAC PT "6 Clicks" Daily Activity  Outcome Measure Difficulty turning over in bed (including adjusting bedclothes, sheets and blankets)?: Unable Difficulty moving from lying on back to sitting on the side of the bed? : Unable Difficulty sitting down on and standing up from a chair with arms (e.g., wheelchair, bedside commode, etc,.)?: Unable Help needed moving to and from a bed to chair (including a wheelchair)?: A Lot Help needed walking in hospital room?: Total Help needed climbing 3-5 steps with a railing? : Total 6 Click Score: 7    End of Session Equipment Utilized During Treatment: Gait belt Activity Tolerance: Patient limited by pain Patient left: in chair;with call bell/phone within reach;with family/visitor present Nurse Communication: Patient requests pain meds PT Visit Diagnosis: Other abnormalities of gait and mobility (R26.89);Difficulty in  walking, not elsewhere classified (R26.2);Pain Pain - Right/Left: Left Pain - part of body: Leg    Time: 1117-1150 PT Time Calculation (min) (ACUTE ONLY): 33 min   Charges:   PT Evaluation $PT Eval Moderate Complexity: 1 Mod PT Treatments $Therapeutic Activity: 8-22 mins        Ellamae Sia, PT, DPT Acute Rehabilitation Services  Pager: [place] 07/17/2018, 1:11 PM

## 2018-07-18 ENCOUNTER — Encounter (HOSPITAL_COMMUNITY): Payer: Self-pay | Admitting: Orthopedic Surgery

## 2018-07-18 LAB — GLUCOSE, CAPILLARY
GLUCOSE-CAPILLARY: 235 mg/dL — AB (ref 70–99)
GLUCOSE-CAPILLARY: 191 mg/dL — AB (ref 70–99)
Glucose-Capillary: 323 mg/dL — ABNORMAL HIGH (ref 70–99)
Glucose-Capillary: 156 mg/dL — ABNORMAL HIGH (ref 70–99)

## 2018-07-18 NOTE — Progress Notes (Signed)
Patient ID: [name], male   DOB: 05-Oct-1935, 83 y.o.   MRN: [id number] Patient is status post transtibial amputation.  There is no drainage in the wound VAC canister he has the restore KCI dressing compression stocking.  Patient and family are interested in discharge to home but patient was full assist prior to his amputation and I doubt that he would be safe going home physical therapy is recommended skilled nursing facility we will plan for discharge to skilled nursing.

## 2018-07-18 NOTE — Plan of Care (Signed)
  Problem: Education: Goal: Knowledge of General Education information will improve Description Including pain rating scale, medication(s)/side effects and non-pharmacologic comfort measures Outcome: Progressing   Problem: Activity: Goal: Risk for activity intolerance will decrease Outcome: Progressing   Problem: Safety: Goal: Ability to remain free from injury will improve Outcome: Progressing   Problem: Elimination: Goal: Will not experience complications related to bowel motility Outcome: Progressing

## 2018-07-18 NOTE — Progress Notes (Signed)
Inpatient Rehabilitation  Per OT request, patient was screened by Gunnar Fusi for appropriateness for an Inpatient Acute Rehab consult.  At this time note that PT is recommending SNF and OT is recommending CIR.  MD if you would like patient to be considered for CIR please place consult order.    Carmelia Roller., CCC/SLP Admission Coordinator  [place]  Cell [phone]

## 2018-07-18 NOTE — NC FL2 (Signed)
MEDICAID FL2 LEVEL OF CARE SCREENING TOOL     IDENTIFICATION  Patient Name: [name] Birthdate: September 05, 1935 Sex: male Admission Date (Current Location): 07/16/2018  [facility] and [place] Number:  Herbalist and Address:  [facility], [place] [address] [place], [place] [address]      Provider Number: [id number]  Attending Physician Name and Address:  [name], MD  Relative Name and Phone Number:       Current Level of Care: Hospital Recommended Level of Care: [place] Prior Approval Number:    Date Approved/Denied:   PASRR Number: [id number]  Discharge Plan: SNF    Current Diagnoses: Patient Active Problem List   Diagnosis Date Noted  . Below knee amputation status, left ([place]) 07/16/2018  . History of transmetatarsal amputation of left foot ([place]) 06/30/2018  . Cellulitis of left foot   . Gangrene of left foot ([place])   . Severe protein-calorie malnutrition ([place])   . Uncontrolled type 2 diabetes mellitus with polyneuropathy ([place])   . PAD (peripheral artery disease) ([place])   . Preoperative cardiovascular examination   . S/P CABG (coronary artery bypass graft)   . Atherosclerosis of native arteries of extremities with gangrene, left leg ([place]) 06/07/2018  . Atherosclerosis of native arteries of the extremities with ulceration ([place]) 05/26/2018  . Acute respiratory failure with hypoxia ([place])   . Hypothyroidism 01/08/2018  . Nausea and vomiting 12/23/2017  . DM type 2 causing vascular disease ([place]) 05/07/2016  . Essential hypertension, benign 05/07/2016  . Other specified hypothyroidism 05/07/2016  . Bilateral carotid bruits 03/11/2016  . Elevated troponin   . Acute on chronic diastolic CHF (congestive heart failure) ([place])   . Bacteremia 02/24/2016  . Sepsis ([place]) 02/22/2016  . Uncontrolled type 2 diabetes mellitus with stage 4 chronic kidney disease ([place]) 02/22/2016  . Demand ischemia ([place]) 02/22/2016   . UTI (urinary tract infection) 02/22/2016  . Acute renal failure superimposed on stage 3 chronic kidney disease ([place])   . Pyrexia   . Arterial hypotension   . Hypoxia   . UTI (lower urinary tract infection)   . OBESITY, UNSPECIFIED 08/24/2009  . Hyperlipidemia 08/15/2009  . Essential hypertension 08/15/2009  . CAD, ARTERY BYPASS GRAFT 08/15/2009  . Atrial fibrillation ([place]) 08/15/2009    Orientation RESPIRATION BLADDER Height & Weight     Self, Time, Situation, Place  O2(Nasal Cannula 2L) Continent Weight: 171 lb 15.3 oz (78 kg) Height:  5\' 5"  (165.1 cm)  BEHAVIORAL SYMPTOMS/MOOD NEUROLOGICAL BOWEL NUTRITION STATUS      Continent Diet(Carb modified, thin liquids)  AMBULATORY STATUS COMMUNICATION OF NEEDS Skin   Extensive Assist Verbally Wound Vac(Wound vac to left leg, 125 mmHg. Closed incision right groin.)                       Personal Care Assistance Level of Assistance  Dressing, Feeding Bathing Assistance: Maximum assistance Feeding assistance: Limited assistance Dressing Assistance: Maximum assistance     Functional Limitations Info  Sight, Hearing, Speech Sight Info: Adequate Hearing Info: Adequate Speech Info: Adequate    SPECIAL CARE FACTORS FREQUENCY  PT (By licensed PT), OT (By licensed OT)     PT Frequency: 2x OT Frequency: 2x            Contractures Contractures Info: Not present    Additional Factors Info  Code Status, Allergies Code Status Info: Full Code Allergies Info: No known allergies  Current Medications (07/18/2018):  This is the current hospital active medication list Current Facility-Administered Medications  Medication Dose Route Frequency Provider Last Rate Last Dose  . 0.9 %  sodium chloride infusion   Intravenous Continuous [name], MD 10 mL/hr at 07/17/18 [phone]    . acetaminophen (TYLENOL) tablet 325-650 mg  325-650 mg Oral Q6H PRN [name], MD      . amLODipine (NORVASC) tablet 5 mg  5 mg Oral  Daily [name], MD   5 mg at 07/18/18 0848  . aspirin chewable tablet 81 mg  81 mg Oral QHS [name], MD   81 mg at 07/17/18 2040  . atorvastatin (LIPITOR) tablet 80 mg  80 mg Oral QHS [name], MD   80 mg at 07/17/18 2040  . bisacodyl (DULCOLAX) suppository 10 mg  10 mg Rectal Daily PRN [name], MD      . docusate sodium (COLACE) capsule 100 mg  100 mg Oral BID [name], MD   100 mg at 07/18/18 0848  . feeding supplement (PRO-STAT SUGAR FREE 64) liquid 30 mL  30 mL Oral TID [name], MD   30 mL at 07/18/18 0848  . HYDROmorphone (DILAUDID) injection 0.5-1 mg  0.5-1 mg Intravenous Q4H PRN [name], MD   1 mg at 07/18/18 1348  . insulin aspart (novoLOG) injection 0-15 Units  0-15 Units Subcutaneous TID WC [name], MD   5 Units at 07/18/18 1232  . insulin aspart (novoLOG) injection 4 Units  4 Units Subcutaneous TID WC [name], MD   4 Units at 07/18/18 1232  . insulin glargine (LANTUS) injection 10 Units  10 Units Subcutaneous QHS [name], MD   10 Units at 07/17/18 2041  . levothyroxine (SYNTHROID, LEVOTHROID) tablet 50 mcg  50 mcg Oral QAC breakfast [name], MD   50 mcg at 07/18/18 0848  . methocarbamol (ROBAXIN) tablet 500 mg  500 mg Oral Q6H PRN [name], MD   500 mg at 07/17/18 1635   Or  . methocarbamol (ROBAXIN) 500 mg in dextrose 5 % 50 mL IVPB  500 mg Intravenous Q6H PRN [name], MD      . metoCLOPramide (REGLAN) tablet 5-10 mg  5-10 mg Oral Q8H PRN [name], MD       Or  . metoCLOPramide (REGLAN) injection 5-10 mg  5-10 mg Intravenous Q8H PRN [name], MD      . multivitamin with minerals tablet 1 tablet  1 tablet Oral Daily [name], MD   1 tablet at 07/18/18 0848  . ondansetron (ZOFRAN) tablet 4 mg  4 mg Oral Q6H PRN [name], MD       Or  . ondansetron [facility] [place] [facility]) injection 4 mg  4 mg Intravenous Q6H PRN [name], MD      . oxyCODONE (Oxy IR/ROXICODONE) immediate release tablet  10-15 mg  10-15 mg Oral Q4H PRN [name], MD   10 mg at 07/18/18 0519  . oxyCODONE (Oxy IR/ROXICODONE) immediate release tablet 5-10 mg  5-10 mg Oral Q4H PRN [name], MD   10 mg at 07/18/18 1108  . polyethylene glycol (MIRALAX / GLYCOLAX) packet 17 g  17 g Oral Daily PRN [name], MD         Discharge Medications: Please see discharge summary for a list of discharge medications.  Relevant Imaging  Results:  Relevant Lab Results:   Additional Information SS# [id number]    Eileen Stanford, LCSW

## 2018-07-18 NOTE — Evaluation (Signed)
Occupational Therapy Evaluation Patient Details Name: [name] MRN: [id number] DOB: 15-Jan-1935 Today's Date: 07/18/2018    History of Present Illness Pt is a 83 y.o. M with significant PMH of CAD, CHF, CKD, DM, and MI. 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   Clinical Impression   PTA, pt was living with his granddaughter and was performing ADLs and transfers. Currently, pt requiring Min A for UB ADLs, Max A for LB ADLs, and Min A for lateral scoots towards drop arm recliner. Pt highly motivated to participate in therapy to increase independence and pt's family very supportive. Educating pt on ROM and stretching in preparation for use of prosthetic; pt and family verbalize understanding. Due to high motivation, family support, and change in functional performance, recommend dc to CIR for intensive OT to optimize safety and independence with ADLs and functional mobility as well as decrease caregiver burden.     Follow Up Recommendations  CIR;Supervision/Assistance - 24 hour    Equipment Recommendations  3 in 1 bedside commode;Other (comment)(Drop arm BSC)    Recommendations for Other Services PT consult;Rehab consult     Precautions / Restrictions Precautions Precautions: Fall Precaution Comments: wound vac Restrictions Weight Bearing Restrictions: Yes LLE Weight Bearing: Non weight bearing      Mobility Bed Mobility Overal bed mobility: Needs Assistance Bed Mobility: Supine to Sit     Supine to sit: Min assist     General bed mobility comments: Min A for bringing hips towards EOB with bed pad. Pt determined to perform majority of transfer himself. Cues for use of bed rail to pull towards EOB.   Transfers Overall transfer level: Needs assistance Equipment used: None Transfers: Lateral/Scoot Transfers          Lateral/Scoot Transfers: Min assist General transfer comment: Min A for balance and weight shifting. requiring cues for sequencing and proper hand  placement.     Balance Overall balance assessment: Needs assistance Sitting-balance support: Bilateral upper extremity supported;Feet unsupported Sitting balance-Leahy Scale: Fair Sitting balance - Comments: Pt requiring Min A for initial sitting balance with posterior lean. Able to gain balance at EOB. Postural control: Posterior lean                                 ADL either performed or assessed with clinical judgement   ADL Overall ADL's : Needs assistance/impaired Eating/Feeding: Independent;Sitting   Grooming: Set up;Supervision/safety;Sitting Grooming Details (indicate cue type and reason): supported sitting Upper Body Bathing: Minimal assistance;Sitting   Lower Body Bathing: Maximal assistance;Sitting/lateral leans;Bed level   Upper Body Dressing : Minimal assistance;Sitting   Lower Body Dressing: Maximal assistance;Sitting/lateral leans;Bed level   Toilet Transfer: Minimal assistance;Transfer board;Cueing for sequencing;Cueing for safety;Requires drop arm(Lateral scoot to drop arm recliner) Toilet Transfer Details (indicate cue type and reason): Min A for balance and weight shifting. Pt performing lateral scoot to right towards drop arm recliner. Right knee blocked throughout transfer for safety.         Functional mobility during ADLs: Minimal assistance;Cueing for sequencing;Cueing for safety(lateral scoot) General ADL Comments: Pt demonstrating increased motivation to participate in therapy. Performing lateral scoot to drop arm recliner.     Vision Baseline Vision/History: Wears glasses Patient Visual Report: No change from baseline       Perception     Praxis      Pertinent Vitals/Pain Pain Assessment: Faces Faces Pain Scale: Hurts little more Pain Location: left residual  limb Pain Descriptors / Indicators: Grimacing;Moaning;Discomfort Pain Intervention(s): Monitored during session;Limited activity within patient's tolerance;Premedicated  before session;Repositioned     Hand Dominance Right   Extremity/Trunk Assessment Upper Extremity Assessment Upper Extremity Assessment: Overall WFL for tasks assessed   Lower Extremity Assessment Lower Extremity Assessment: Defer to PT evaluation;LLE deficits/detail LLE Deficits / Details: s/p L BKA.  LLE: Unable to fully assess due to pain   Cervical / Trunk Assessment Cervical / Trunk Assessment: Normal   Communication Communication Communication: No difficulties   Cognition Arousal/Alertness: Awake/alert Behavior During Therapy: WFL for tasks assessed/performed Overall Cognitive Status: Within Functional Limits for tasks assessed                                 General Comments: Motivated to participate in therapy and go home   General Comments  Granddaughter, son, and daughter in law present during session    Exercises Exercises: Other exercises Other Exercises Other Exercises: Educating pt on ROM and strength at left knee and hip in preparation for prosthetic   Shoulder Instructions      Home Living Family/patient expects to be discharged to:: Private residence Living Arrangements: Other relatives(granddaughter) Available Help at Discharge: Family;Available 24 hours/day Type of Home: House Home Access: Level entry     Home Layout: One level     Bathroom Shower/Tub: Walk-in shower(Handicap roll in shower)   Bathroom Toilet: Handicapped height Bathroom Accessibility: Yes   Home Equipment: Walker - 2 wheels;Wheelchair - manual;Grab bars - toilet;Grab bars - tub/shower;Shower seat          Prior Functioning/Environment Level of Independence: Independent with assistive device(s)        Comments: Independent with transfers and uses wheelchair for mobility. Indep with ADL's        OT Problem List: Decreased strength;Decreased range of motion;Decreased activity tolerance;Impaired balance (sitting and/or standing);Decreased knowledge of  use of DME or AE;Decreased knowledge of precautions;Pain      OT Treatment/Interventions: Self-care/ADL training;Therapeutic exercise;Energy conservation;DME and/or AE instruction;Therapeutic activities;Patient/family education    OT Goals(Current goals can be found in the care plan section) Acute Rehab OT Goals Patient Stated Goal: "Walk again with a prosthetic" OT Goal Formulation: With patient Time For Goal Achievement: 08/01/18 Potential to Achieve Goals: Good ADL Goals Pt Will Perform Upper Body Dressing: sitting;with modified independence Pt Will Perform Lower Body Dressing: with modified independence;sitting/lateral leans;bed level Pt Will Transfer to Toilet: with modified independence;bedside commode;with transfer board(drop arm) Pt Will Perform Toileting - Clothing Manipulation and hygiene: with modified independence;sitting/lateral leans  OT Frequency: Min 3X/week   Barriers to D/C:            Co-evaluation              AM-PAC PT "6 Clicks" Daily Activity     Outcome Measure Help from another person eating meals?: None Help from another person taking care of personal grooming?: A Little Help from another person toileting, which includes using toliet, bedpan, or urinal?: A Lot Help from another person bathing (including washing, rinsing, drying)?: A Lot Help from another person to put on and taking off regular upper body clothing?: A Little Help from another person to put on and taking off regular lower body clothing?: A Lot 6 Click Score: 16   End of Session Equipment Utilized During Treatment: Gait belt Nurse Communication: Mobility status;Precautions;Weight bearing status  Activity Tolerance: Patient tolerated treatment well Patient left: in chair;with  call bell/phone within reach;with family/visitor present  OT Visit Diagnosis: Unsteadiness on feet (R26.81);Other abnormalities of gait and mobility (R26.89);Muscle weakness (generalized) (M62.81);Pain Pain -  Right/Left: Left Pain - part of body: Leg                Time: 1430-1456 OT Time Calculation (min): 26 min Charges:  OT General Charges $OT Visit: 1 Visit OT Evaluation $OT Eval Moderate Complexity: 1 Mod OT Treatments $Self Care/Home Management : 8-22 mins  Taylore Hinde MSOT, OTR/L Acute Rehab Pager: [phone] Office: [place] 07/18/2018, 4:54 PM

## 2018-07-19 DIAGNOSIS — Z89512 Acquired absence of left leg below knee: Secondary | ICD-10-CM

## 2018-07-19 DIAGNOSIS — I739 Peripheral vascular disease, unspecified: Secondary | ICD-10-CM

## 2018-07-19 DIAGNOSIS — N183 Chronic kidney disease, stage 3 unspecified: Secondary | ICD-10-CM

## 2018-07-19 DIAGNOSIS — G8918 Other acute postprocedural pain: Secondary | ICD-10-CM

## 2018-07-19 DIAGNOSIS — E875 Hyperkalemia: Secondary | ICD-10-CM

## 2018-07-19 DIAGNOSIS — I25810 Atherosclerosis of coronary artery bypass graft(s) without angina pectoris: Secondary | ICD-10-CM

## 2018-07-19 DIAGNOSIS — I509 Heart failure, unspecified: Secondary | ICD-10-CM

## 2018-07-19 DIAGNOSIS — D62 Acute posthemorrhagic anemia: Secondary | ICD-10-CM

## 2018-07-19 DIAGNOSIS — E1165 Type 2 diabetes mellitus with hyperglycemia: Secondary | ICD-10-CM

## 2018-07-19 LAB — GLUCOSE, CAPILLARY
GLUCOSE-CAPILLARY: 180 mg/dL — AB (ref 70–99)
GLUCOSE-CAPILLARY: 233 mg/dL — AB (ref 70–99)
Glucose-Capillary: 300 mg/dL — ABNORMAL HIGH (ref 70–99)
Glucose-Capillary: 226 mg/dL — ABNORMAL HIGH (ref 70–99)

## 2018-07-19 NOTE — Progress Notes (Addendum)
Inpatient Rehabilitation  Please see consult, by Dr. Posey Pronto for full details.  I requested PT see 8/6 and await updated PT notes prior to initiating insurance with [facility].  Plan to follow up tomorrow. Call if questions.  Carmelia Roller., CCC/SLP Admission Coordinator  [place]  Cell [phone]

## 2018-07-19 NOTE — Consult Note (Signed)
Physical Medicine and Rehabilitation Consult   Reason for Consult: L-BKA with functional deficits Referring Physician:  Dr. Sharol Given    HPI: [name] is a 83 y.o. male with history of CKD, CAD s/p CABG, T2DM, CHF, PAD LLE with claudication and left foot wound with gangrenous changes. History taken from chart review, granddaughter, and patient. He failed attempts at limb salvage and admitted on 07/16/18 for L-BKA by Dr. Sharol Given. Post op blood sugars noted to be poorly controlled and pain control improving. CIR recommended due to functional deficits.  PTA- patient was able to perform transfers independently. Granddaughter has moved in to assist as needed since last surgery.   Review of Systems  Constitutional: Negative for chills and fever.  HENT: Positive for hearing loss.   Eyes: Negative for blurred vision and double vision.  Respiratory: Negative for hemoptysis and shortness of breath.   Cardiovascular: Negative for chest pain and palpitations.  Gastrointestinal: Negative for abdominal pain, constipation, heartburn and nausea.  Genitourinary: Negative for dysuria.  Musculoskeletal: Positive for myalgias. Negative for neck pain.  Skin: Negative for rash.  Neurological: Negative for sensory change and speech change.  Psychiatric/Behavioral: Negative for memory loss. The patient does not have insomnia.   All other systems reviewed and are negative.     Past Medical History:  Diagnosis Date  . Anemia   . Arthritis   . Carotid artery disease ([place])   . CHF (congestive heart failure) ([place])   . CKD (chronic kidney disease), stage IV ([place])   . Coronary artery disease 2010   Status post coronary artery bypass grafting  . Diabetes mellitus    Type II  . Dyspnea   . Dysrhythmia   . Foot ulcer ([place])   . History of blood transfusion 05/2018  . Hyperlipidemia   . Hypertension   . Hypothyroidism   . Myocardial infarction ([place]) 2010  . PAD (peripheral artery disease) ([place])     . Postoperative atrial fibrillation ([place])   . Thrombocytopenia ([place])     Past Surgical History:  Procedure Laterality Date  . AMPUTATION Left 06/16/2018   Procedure: LEFT TRANSMETATARSAL AMPUTATION;  Surgeon: [name], MD;  Location: [facility];  Service: Orthopedics;  Laterality: Left;  . AMPUTATION Left 07/16/2018   Procedure: LEFT BELOW KNEE AMPUTATION;  Surgeon: [name], MD;  Location: [place] [facility];  Service: Orthopedics;  Laterality: Left;  . AORTOGRAM Left 06/03/2018   Procedure: ABDOMINAL AORTOGRAM WITH CARBON DIOXIDE LEFT LOWER EXTREMITY RUNOFF;  Surgeon: Conrad [place], MD;  Location: [facility];  Service: Vascular;  Laterality: Left;  . BELOW KNEE LEG AMPUTATION Left 07/16/2018  . carotid insuff    . CORONARY ARTERY BYPASS GRAFT  2010   LIMA to LAD, SVG to diagonal, SVG to circumflex, marginal, SVG to posterior descending branch.  . FEMORAL-POPLITEAL BYPASS GRAFT Left 06/10/2018   Procedure: LEFT COMMON FEMORAL TO BELOW KNEE POPLITEAL BYPASS GRAFT WITH PROPATEN;  Surgeon: Conrad Cooke, MD;  Location: [facility] OR;  Service: Vascular;  Laterality: Left;    Family History  Problem Relation Age of Onset  . Heart attack Father   . Heart attack Brother     Social History:  Lives with family--granddaughter assisted PTA. He was independent for transfers and ALDs but has been on ambulatory due to WB restrictions on LLE. He reports that he has never smoked. He has never used smokeless tobacco. He reports that he does not use alcohol. He reports that he  does not use drugs.    Allergies: No Known Allergies    Medications Prior to Admission  Medication Sig Dispense Refill  . acetaminophen (TYLENOL) 325 MG tablet Take 2 tablets (650 mg total) by mouth every 6 (six) hours as needed for mild pain (or Fever >/= 101). 30 tablet 0  . amLODipine (NORVASC) 5 MG tablet Take 5 mg by mouth daily.    Marland Kitchen aspirin 81 MG tablet Take 81 mg by mouth at bedtime.     Marland Kitchen atorvastatin (LIPITOR) 80 MG tablet Take 80 mg  by mouth at bedtime.   3  . furosemide (LASIX) 40 MG tablet Take 80 mg by mouth daily.    . insulin glargine (LANTUS) 100 UNIT/ML injection Inject 0.25 mLs (25 Units total) into the skin 2 (two) times daily. 10 mL 1  . insulin NPH-regular Human (HUMULIN 70/30) (70-30) 100 UNIT/ML injection Inject 0-6 Units into the skin 3 (three) times daily.    Marland Kitchen levothyroxine (SYNTHROID, LEVOTHROID) 50 MCG tablet Take 50 mcg by mouth daily before breakfast.     . nitroGLYCERIN (NITRODUR - DOSED IN MG/24 HR) 0.2 mg/hr patch Place 1 patch (0.2 mg total) onto the skin daily. 30 patch 12  . ondansetron (ZOFRAN) 4 MG tablet Take 1 tablet (4 mg total) by mouth every 6 (six) hours as needed for nausea. 20 tablet 0  . oxyCODONE (OXY IR/ROXICODONE) 5 MG immediate release tablet Take 1 tablet (5 mg total) by mouth every 4 (four) hours as needed for breakthrough pain. (Patient taking differently: Take 10 mg by mouth every 4 (four) hours as needed for breakthrough pain. ) 18 tablet 0  . potassium chloride (K-DUR,KLOR-CON) 10 MEQ tablet Take 10 mEq by mouth daily.    Marland Kitchen senna (SENOKOT) 8.6 MG TABS tablet Take 1 tablet (8.6 mg total) by mouth daily. 30 each 0  . oxyCODONE (OXY IR/ROXICODONE) 5 MG immediate release tablet Take 1 tablet (5 mg total) by mouth every 6 (six) hours as needed for moderate pain. (Patient not taking: Reported on 07/15/2018) 12 tablet 0  . oxyCODONE-acetaminophen (PERCOCET/ROXICET) 5-325 MG tablet Take 1 tablet by mouth every 4 (four) hours as needed for severe pain. (Patient not taking: Reported on 07/15/2018) 30 tablet 0  . silver sulfADIAZINE (SILVADENE) 1 % cream Apply 1 application topically daily. (Patient not taking: Reported on 07/15/2018) 50 g 0    Home: Home Living Family/patient expects to be discharged to:: Private residence Living Arrangements: Other relatives(granddaughter) Available Help at Discharge: Family, Available 24 hours/day Type of Home: House Home Access: Level entry Home Layout:  One level Bathroom Shower/Tub: Walk-in shower(Handicap roll in shower) Bathroom Toilet: Handicapped height Bathroom Accessibility: Yes Home Equipment: Environmental consultant - 2 wheels, Wheelchair - manual, Grab bars - toilet, Grab bars - tub/shower, Shower seat  Functional History: Prior Function Level of Independence: Independent with assistive device(s) Comments: Independent with transfers and uses wheelchair for mobility. Indep with ADL's Functional Status:  Mobility: Bed Mobility Overal bed mobility: Needs Assistance Bed Mobility: Supine to Sit Supine to sit: Min assist General bed mobility comments: Min A for bringing hips towards EOB with bed pad. Pt determined to perform majority of transfer himself. Cues for use of bed rail to pull towards EOB.  Transfers Overall transfer level: Needs assistance Equipment used: None Transfers: Lateral/Scoot Transfers Squat pivot transfers: Max assist  Lateral/Scoot Transfers: Min assist General transfer comment: Min A for balance and weight shifting. requiring cues for sequencing and proper hand placement.  ADL: ADL Overall ADL's : Needs assistance/impaired Eating/Feeding: Independent, Sitting Grooming: Set up, Supervision/safety, Sitting Grooming Details (indicate cue type and reason): supported sitting Upper Body Bathing: Minimal assistance, Sitting Lower Body Bathing: Maximal assistance, Sitting/lateral leans, Bed level Upper Body Dressing : Minimal assistance, Sitting Lower Body Dressing: Maximal assistance, Sitting/lateral leans, Bed level Toilet Transfer: Minimal assistance, Transfer board, Cueing for sequencing, Cueing for safety, Requires drop arm(Lateral scoot to drop arm recliner) Toilet Transfer Details (indicate cue type and reason): Min A for balance and weight shifting. Pt performing lateral scoot to right towards drop arm recliner. Right knee blocked throughout transfer for safety. Functional mobility during ADLs: Minimal  assistance, Cueing for sequencing, Cueing for safety(lateral scoot) General ADL Comments: Pt demonstrating increased motivation to participate in therapy. Performing lateral scoot to drop arm recliner.  Cognition: Cognition Overall Cognitive Status: Within Functional Limits for tasks assessed Orientation Level: Oriented X4 Cognition Arousal/Alertness: Awake/alert Behavior During Therapy: WFL for tasks assessed/performed Overall Cognitive Status: Within Functional Limits for tasks assessed General Comments: Motivated to participate in therapy and go home   Blood pressure (!) 135/56, pulse 63, temperature 98.1 F (36.7 C), temperature source Oral, resp. rate 17, height 5\' 5"  (1.651 m), weight 78 kg (171 lb 15.3 oz), SpO2 97 %. Physical Exam  Nursing note and vitals reviewed. Constitutional: He is oriented to person, place, and time. He appears well-developed and well-nourished.  HENT:  Head: Normocephalic and atraumatic.  Eyes: EOM are normal. Right eye exhibits no discharge. Left eye exhibits no discharge.  Neck: Normal range of motion. Neck supple.  Cardiovascular: Normal rate and regular rhythm.  Murmur (heard best LSB.) heard. Respiratory: Effort normal and breath sounds normal.  GI: Soft. Bowel sounds are normal.  Musculoskeletal:  LLE with limb guard in place, edema and tenderness.   Neurological: He is alert and oriented to person, place, and time.  Motor: Bilateral upper extremities: 4/5 proximal distal Right lower extremity: 4 -/5 proximal distal Lower extremity: 3/5 hip flexion (pain inhibition) Sensation intact light touch  Skin:  LLE with dressing c/d/i +VAC  Psychiatric: He has a normal mood and affect. His behavior is normal.    Results for orders placed or performed during the hospital encounter of 07/16/18 (from the past 24 hour(s))  Glucose, capillary     Status: Abnormal   Collection Time: 07/18/18 12:06 PM  Result Value Ref Range   Glucose-Capillary 235  (H) 70 - 99 mg/dL   Comment 1 Notify RN    Comment 2 Document in Chart   Glucose, capillary     Status: Abnormal   Collection Time: 07/18/18  4:24 PM  Result Value Ref Range   Glucose-Capillary 156 (H) 70 - 99 mg/dL   Comment 1 Notify RN    Comment 2 Document in Chart   Glucose, capillary     Status: Abnormal   Collection Time: 07/18/18  9:03 PM  Result Value Ref Range   Glucose-Capillary 191 (H) 70 - 99 mg/dL  Glucose, capillary     Status: Abnormal   Collection Time: 07/19/18  8:03 AM  Result Value Ref Range   Glucose-Capillary 300 (H) 70 - 99 mg/dL   No results found.  Assessment/Plan: Diagnosis:  L-BKA Labs independently reviewed.  Records reviewed and summated above. Clean amputation daily with soap and water Monitor incision site for signs of infection or impending skin breakdown. Staples to remain in place for 3-4 weeks Stump shrinker, for edema control  Scar mobilization massaging to prevent soft  tissue adherence Stump protector during therapies Prevent flexion contractures by implementing the following:   Encourage prone lying for 20-30 mins per day BID to avoid hip flexion  Contractures if medically appropriate;  Avoid pillow under knees when patient is lying in bed in order to prevent both  knee and hip flexion contractures;  Avoid prolonged sitting Post surgical pain control with oral medication Phantom limb pain control with physical modalities including desensitization techniques (gentle self massage to the residual stump,hot packs if sensation intact, [place]) and mirror therapy, TENS. If ineffective, consider pharmacological treatment for neuropathic pain (e.g gabapentin, pregabalin, amytriptalyine, duloxetine).  When using wheelchair, patient should have knee on amputated side fully extended with board under the seat cushion. Avoid injury to contralateral side  1. Does the need for close, 24 hr/day medical supervision in concert with the patient's rehab needs make it  unreasonable for this patient to be served in a less intensive setting? Likely  2. Co-Morbidities requiring supervision/potential complications: CKD (avoid nephrotoxic meds), CAD s/p CABG (continue meds), T2 DM with poorly controlled blood sugars (Monitor in accordance with exercise and adjust meds as necessary), CHF (Monitor in accordance with increased physical activity and avoid UE resistance excercises), PAD (cont meds), post-op pain (Biofeedback training with therapies to help reduce reliance on opiate pain medications, monitor pain control during therapies, and sedation at rest and titrate to maximum efficacy to ensure participation and gains in therapies), hyperkalemia (continue to monitor, treated necessary), ABLA (transfuse if necessary to ensure appropriate perfusion for increased activity tolerance) 3. Due to bladder management, safety, skin/wound care, disease management, pain management and patient education, does the patient require 24 hr/day rehab nursing? Yes 4. Does the patient require coordinated care of a physician, rehab nurse, PT (1-2 hrs/day, 5 days/week) and OT (1-2 hrs/day, 5 days/week) to address physical and functional deficits in the context of the above medical diagnosis(es)? Yes Addressing deficits in the following areas: balance, endurance, locomotion, strength, transferring, bathing, dressing, toileting and psychosocial support 5. Can the patient actively participate in an intensive therapy program of at least 3 hrs of therapy per day at least 5 days per week? Yes 6. The potential for patient to make measurable gains while on inpatient rehab is excellent 7. Anticipated functional outcomes upon discharge from inpatient rehab are supervision and min assist  with PT, supervision and min assist with OT, n/a with SLP. 8. Estimated rehab length of stay to reach the above functional goals is: 14-18 days. 9. Anticipated D/C setting: Home 10. Anticipated post D/C treatments: HH  therapy and Home excercise program 11. Overall Rehab/Functional Prognosis: good  RECOMMENDATIONS: This patient's condition is appropriate for continued rehabilitative care in the following setting: Patient will likely require CIR, however recommend reevaluation by therapies to determine anticipated functional outcomes. Patient has agreed to participate in recommended program. Yes Note that insurance prior authorization may be required for reimbursement for recommended care.  Comment: Rehab Admissions Coordinator to follow up.   I have personally performed a face to face diagnostic evaluation, including, but not limited to relevant history and physical exam findings, of this patient and developed relevant assessment and plan.  Additionally, I have reviewed and concur with the physician assistant's documentation above.   Delice Lesch, MD, ABPMR Bary Leriche, PA-C 07/19/2018

## 2018-07-19 NOTE — Progress Notes (Deleted)
    Postoperative Visit   History of Present Illness   LOOMIS ANACKER is a 83 y.o. year old male who presents for postoperative follow-up for: L CFA to BK pop BPG w/ Propaten (06/10/18) for L foot gangrene.  Dr. Sharol Given did a L TMA on 06/16/18 and L BKA on 07/16/18.  ***    For VQI Use Only   PRE-ADM LIVING: Home  AMB STATUS: Wheelchair   Physical Examination   There were no vitals filed for this visit.  LLE: Incisions are healing, L TMA: plantar skin looks ischemic, no frank drainage, faintly palpable DP   Medical Decision Making   TIDUS UPCHURCH is a 83 y.o. year old male who presents s/p L CFA to BK pop BPG with Propaten, s/p L TMA followed by L BKA   ***  Thank you for allowing [place] to participate in this patient's care.   Adele Barthel, MD, FACS [facility] Office: [phone] Pager: [phone]

## 2018-07-19 NOTE — Progress Notes (Signed)
Patient ID: [name], male   DOB: 1935-05-03, 83 y.o.   MRN: [id number] Patient is resting comfortably this morning.  Patient may be a candidate for inpatient rehabilitation.  Orders were written for evaluation for inpatient rehabilitation.  Wound VAC is clean and dry no drainage.

## 2018-07-19 NOTE — Progress Notes (Signed)
Inpatient Diabetes Program Recommendations  AACE/ADA: New Consensus Statement on Inpatient Glycemic Control (2015)  Target Ranges:  Prepandial:   less than 140 mg/dL      Peak postprandial:   less than 180 mg/dL (1-2 hours)      Critically ill patients:  140 - 180 mg/dL   Results for SHYQUAN, STALLBAUMER (MRN [id number]) as of 07/19/2018 10:06  Ref. Range 07/18/2018 06:48 07/18/2018 12:06 07/18/2018 16:24 07/18/2018 21:03 07/19/2018 08:03  Glucose-Capillary Latest Ref Range: 70 - 99 mg/dL 323 (H) 235 (H) 156 (H) 191 (H) 300 (H)   Review of Glycemic Control  Diabetes history: DM 2 Outpatient Diabetes medications: Lantus 25 units BID Current orders for Inpatient glycemic control: Lantus 10 units qhs, Novolog 0-15 units tid, Novolog 4 units tid meal coverage  Inpatient Diabetes Program Recommendations:    Glucose trends elevated especially fasting glucose. Patient takes more long acting insulin at home. Please consider increasing Lantus to 18 units qhs.  Thanks,  Tama Headings RN, MSN, BC-ADM Inpatient Diabetes Coordinator Team Pager [phone] (8a-5p)

## 2018-07-20 LAB — GLUCOSE, CAPILLARY
GLUCOSE-CAPILLARY: 286 mg/dL — AB (ref 70–99)
GLUCOSE-CAPILLARY: 251 mg/dL — AB (ref 70–99)
Glucose-Capillary: 259 mg/dL — ABNORMAL HIGH (ref 70–99)
Glucose-Capillary: 179 mg/dL — ABNORMAL HIGH (ref 70–99)

## 2018-07-20 NOTE — Progress Notes (Signed)
Inpatient Rehabilitation  Note that PT is not signed up to treat patient today.  I have called to acute therapy office to again request PT to see for updates and to allow me to initiate insurance authorization.  Call if questions.   Carmelia Roller., CCC/SLP Admission Coordinator  [place]  Cell [phone]

## 2018-07-20 NOTE — Progress Notes (Signed)
Inpatient Diabetes Program Recommendations  AACE/ADA: New Consensus Statement on Inpatient Glycemic Control (2015)  Target Ranges:  Prepandial:   less than 140 mg/dL      Peak postprandial:   less than 180 mg/dL (1-2 hours)      Critically ill patients:  140 - 180 mg/dL   Lab Results  Component Value Date   GLUCAP 259 (H) 07/20/2018   HGBA1C 8.3 (H) 05/31/2018    Review of Glycemic Control  Diabetes history: DM 2 Outpatient Diabetes medications: Lantus 25 units BID Current orders for Inpatient glycemic control: Lantus 10 units qhs, Novolog 0-15 units tid, Novolog 4 units tid meal coverage  Inpatient Diabetes Program Recommendations:    Glucose trends elevated especially fasting glucose. Patient takes more long acting insulin at home.  Please consider  -Increase Lantus to 18 units qhs -Increase Novolog meal coverage to 6 units tid if eats 50%  Thank you, Bethena Roys E. Clarisse Rodriges, RN, MSN, CDE  Diabetes Coordinator Inpatient Glycemic Control Team Team Pager [phone] (8am-5pm) 07/20/2018 3:57 PM

## 2018-07-20 NOTE — Progress Notes (Signed)
Occupational Therapy Treatment Patient Details Name: LUCIANO CINQUEMANI MRN: [id number] DOB: March 20, 1935 Today's Date: 07/20/2018    History of present illness (P) Dewaun Kinzler is an 83 y.o. M s/p L BKA with PMH of CAD, CKD - III, T2DM, CHF, and MI.   OT comments  Pt completed lateral scoot transfer MIN (A) to recliner for grooming adls this session. Pt with 6-8 range of pain during session but able to continue to progress toward goals. Pt reports feeling better and pain more 6 after repositioned. Pt smiling and thanking therapist for treatment.   Follow Up Recommendations  CIR;Supervision/Assistance - 24 hour    Equipment Recommendations  3 in 1 bedside commode;Other (comment)    Recommendations for Other Services PT consult;Rehab consult    Precautions / Restrictions Precautions Precautions: (P) Fall Precaution Comments: (P) wound vac Required Braces or Orthoses: (P) Other Brace/Splint Other Brace/Splint: (P) limb guard on L LE Restrictions Weight Bearing Restrictions: (P) Yes LLE Weight Bearing: (P) Non weight bearing       Mobility Bed Mobility Overal bed mobility: Needs Assistance Bed Mobility: Supine to Sit     Supine to sit: Min assist     General bed mobility comments: (P) Pt in chair upon arrival  Transfers Overall transfer level: (P) Needs assistance Equipment used: None Transfers: (P) Sit to/from Stand Sit to Stand: (P) Min assist;+2 physical assistance        Lateral/Scoot Transfers: Min assist General transfer comment: (P) Pt required MinA+2 with Stedy and bed pad for 2x sit<>stand transfers. Pt was able to stand up tall and responded to cues to look up.     Balance Overall balance assessment: (P) Needs assistance Sitting-balance support: Bilateral upper extremity supported Sitting balance-Leahy Scale: Fair Sitting balance - Comments: pt needed incr time to position in static sitting and progressed to min (A) for L LE (A)                                    ADL either performed or assessed with clinical judgement   ADL Overall ADL's : Needs assistance/impaired Eating/Feeding: Independent;Sitting   Grooming: Set up;Sitting Grooming Details (indicate cue type and reason): pt positioned in the recliner by the sink to complete grooming. pt thanking therapist helping complete oral care for the first time this admission                 Toilet Transfer: Minimal assistance Toilet Transfer Details (indicate cue type and reason): lateral scoot R side - simulated eob to chair with bed slightly elevated           General ADL Comments: pt completed bed to chair transfer lateral scoot to the right. pt in chair for grooming and then positioned with pillows. pt provided printout for [employer] mail hold and informed delivery to help patient manage mail concerns.      Vision       Perception     Praxis      Cognition Arousal/Alertness: (P) Awake/alert Behavior During Therapy: (P) WFL for tasks assessed/performed Overall Cognitive Status: (P) Within Functional Limits for tasks assessed                                 General Comments: (P) Pt is pleasant and motivated to get moving        Exercises  Shoulder Instructions       General Comments OT doff the limb guard to check skin and no deficits noted. pt needed repositioning of guard for full knee extension. pt tolerated well    Pertinent Vitals/ Pain       Pain Assessment: (P) Faces Pain Score: 8  Faces Pain Scale: (P) Hurts even more Pain Location: L residual limb Pain Descriptors / Indicators: (P) Grimacing;Guarding Pain Intervention(s): (P) Limited activity within patient's tolerance;Monitored during session;Premedicated before session;Repositioned  Home Living                                          Prior Functioning/Environment              Frequency  Min 3X/week        Progress Toward Goals  OT  Goals(current goals can now be found in the care plan section)  Progress towards OT goals: Progressing toward goals  Acute Rehab OT Goals Patient Stated Goal: (P) To go to [place] with family in September OT Goal Formulation: With patient Time For Goal Achievement: 08/01/18 Potential to Achieve Goals: Good ADL Goals Pt Will Perform Upper Body Dressing: sitting;with modified independence Pt Will Perform Lower Body Dressing: with modified independence;sitting/lateral leans;bed level Pt Will Transfer to Toilet: with modified independence;bedside commode;with transfer board Pt Will Perform Toileting - Clothing Manipulation and hygiene: with modified independence;sitting/lateral leans  Plan Discharge plan remains appropriate    Co-evaluation                 AM-PAC PT "6 Clicks" Daily Activity     Outcome Measure   Help from another person eating meals?: None Help from another person taking care of personal grooming?: A Little Help from another person toileting, which includes using toliet, bedpan, or urinal?: A Lot Help from another person bathing (including washing, rinsing, drying)?: A Lot Help from another person to put on and taking off regular upper body clothing?: A Little Help from another person to put on and taking off regular lower body clothing?: A Lot 6 Click Score: 16    End of Session    OT Visit Diagnosis: Unsteadiness on feet (R26.81);Other abnormalities of gait and mobility (R26.89);Muscle weakness (generalized) (M62.81);Pain Pain - Right/Left: Left Pain - part of body: Leg   Activity Tolerance Patient tolerated treatment well   Patient Left in chair;with call bell/phone within reach;with chair alarm set   Nurse Communication Mobility status;Precautions        Time: 6286-3817 OT Time Calculation (min): 43 min  Charges: OT General Charges $OT Visit: 1 Visit OT Treatments $Self Care/Home Management : 38-52 mins   Jeri Modena   OTR/L Pager:  [phone] Office: [phone] .    Parke Poisson B 07/20/2018, 11:38 AM

## 2018-07-20 NOTE — Care Management Important Message (Signed)
Important Message  Patient Details  Name: [name] MRN: [id number] Date of Birth: Jan 09, 1935   Medicare Important Message Given:  Yes    Kynli Chou Montine Circle 07/20/2018, 8:53 AM

## 2018-07-20 NOTE — Progress Notes (Signed)
Patient ID: [name], male   DOB: 08/21/1935, 83 y.o.   MRN: [id number] Orders to be written for evaluation for outpatient versus inpatient rehabilitation.  Inpatient rehabilitation physician has requested a separate reevaluation by physical therapy.  Discharge planning pending the separate evaluation and then authorization by insurance.  Patient without complaints this morning with no drainage in the wound VAC canister.

## 2018-07-20 NOTE — Progress Notes (Signed)
07/20/18 1131  PT Visit Information  Last PT Received On 07/20/18  Assistance Needed +1  History of Present Illness [name] is an 83 y.o. M s/p L BKA with PMH of CAD, CKD - III, T2DM, CHF, and MI.  Subjective Data  Patient Stated Goal To go to [place] with family in September  Precautions  Precautions Fall  Precaution Comments wound vac  Required Braces or Orthoses Other Brace/Splint  Other Brace/Splint limb guard on L LE  Restrictions  Weight Bearing Restrictions Yes  LLE Weight Bearing NWB  Pain Assessment  Pain Assessment Faces  Faces Pain Scale 6  Pain Location L residual limb  Pain Descriptors / Indicators Grimacing;Guarding  Pain Intervention(s) Limited activity within patient's tolerance;Monitored during session;Premedicated before session;Repositioned  Cognition  Arousal/Alertness Awake/alert  Behavior During Therapy WFL for tasks assessed/performed  Overall Cognitive Status Within Functional Limits for tasks assessed  General Comments Pt is pleasant and motivated to get moving  Bed Mobility  General bed mobility comments Pt in chair upon arrival  Transfers  Overall transfer level Needs assistance  Transfer via Lift Equipment Stedy  Transfers Sit to/from Stand  Sit to [employer] physical assistance  General transfer comment Pt required MinA+2 with Stedy and bed pad for 2x sit<>stand transfers. Pt was able to stand up tall and responded to cues to look up. Able to tolerate standing for ~5-10 seconds. Heavy reliance on UE support to stand.    Balance  Overall balance assessment Needs assistance  Sitting-balance support Bilateral upper extremity supported;Feet unsupported  Sitting balance-Leahy Scale Fair  Sitting balance - Comments Pt in recliner with back supported. Pt was able to sit with arms in lap and move L LE for seated HEP.  Standing balance support Bilateral upper extremity supported  Standing balance-Leahy Scale Poor  Standing balance  comment Pt had no visible LOB once standing, but was reliant on UE support for balance.   General Comments  General comments (skin integrity, edema, etc.) Pt is pleasant, and motivated for activity. Educated patient on seated HEP to support mobility.  Exercises  Exercises General Lower Extremity  General Exercises - Lower Extremity  Quad Sets AROM;Left;10 reps;Seated  Gluteal Sets Both;AROM;10 reps;Seated  Hip ABduction/ADduction Left;10 reps;Seated;AROM (Simultaneous filing. User may not have seen previous data.)  Straight Leg Raises AROM;Left;10 reps;Seated  PT - End of Session  Equipment Utilized During Treatment Gait belt  Activity Tolerance Patient tolerated treatment well  Patient left in chair;with call bell/phone within reach;with chair alarm set;with nursing/sitter in room  Nurse Communication Mobility status;Other (comment) (Updating recommendations for CIR)   PT - Assessment/Plan  PT Plan Discharge plan needs to be updated;Frequency needs to be updated  PT Visit Diagnosis Other abnormalities of gait and mobility (R26.89);Muscle weakness (generalized) (M62.81);Difficulty in walking, not elsewhere classified (R26.2);Pain  Pain - Right/Left Left  Pain - part of body Leg  PT Frequency (ACUTE ONLY) Min 3X/week  Follow Up Recommendations CIR;Supervision for mobility/OOB  PT equipment Other (comment) (Defer)  AM-PAC PT "6 Clicks" Daily Activity Outcome Measure  Difficulty turning over in bed (including adjusting bedclothes, sheets and blankets)? 1  Difficulty moving from lying on back to sitting on the side of the bed?  1  Difficulty sitting down on and standing up from a chair with arms (e.g., wheelchair, bedside commode, etc,.)? 1  Help needed moving to and from a bed to chair (including a wheelchair)? 2  Help needed walking in hospital room? 1  Help needed  climbing 3-5 steps with a railing?  1  6 Click Score 7  Mobility G Code  CM  PT Goal Progression  Progress towards PT  goals Progressing toward goals  Acute Rehab PT Goals  PT Goal Formulation With patient  Time For Goal Achievement 07/31/18  Potential to Achieve Goals Good  PT Time Calculation  PT Start Time (ACUTE ONLY) 1106  PT Stop Time (ACUTE ONLY) 1127  PT Time Calculation (min) (ACUTE ONLY) 21 min  PT General Charges  $$ ACUTE PT VISIT 1 Visit  PT Treatments  $Therapeutic Activity 8-22 mins   Pt is progressing toward goals. Pt performed seated HEP and was educated on the importance of mobility. Pt performed 2x sit<>stand transfers with Stedy and MinA+2. Pt tolerated treatment well. Due to pt's progression, motivation to participate in therapy, and increased tolerance, we're updating our d/c recommendations to CIR. Will continue to follow acutely to support independence, mobility, and safety.   Elwin Mocha, S-DPT [place] [facility] [phone]

## 2018-07-20 NOTE — Progress Notes (Signed)
Inpatient Rehabilitation  Insurance authorization has been initiated with [facility].    Met with patient and daughter-in-law at bedside to discuss team's recommendation for IP Rehab.  Shared booklets, insurance verification letter, and answered questions.  Plan to follow for timing of medical readiness, insurance authorization, and IP Rehab bed availability.  Hopeful for a decision tomorrow.  Discussed with nurse case manager.  Call if questions.    Carmelia Roller., CCC/SLP Admission Coordinator  [place]  Cell [phone]

## 2018-07-20 NOTE — Plan of Care (Signed)
  Problem: Education: Goal: Knowledge of General Education information will improve Description: Including pain rating scale, medication(s)/side effects and non-pharmacologic comfort measures Outcome: Progressing   Problem: Clinical Measurements: Goal: Ability to maintain clinical measurements within normal limits will improve Outcome: Progressing   Problem: Activity: Goal: Risk for activity intolerance will decrease Outcome: Progressing   Problem: Safety: Goal: Ability to remain free from injury will improve Outcome: Progressing   Problem: Skin Integrity: Goal: Risk for impaired skin integrity will decrease Outcome: Progressing   

## 2018-07-21 ENCOUNTER — Ambulatory Visit: Payer: Medicare HMO | Admitting: Vascular Surgery

## 2018-07-21 LAB — GLUCOSE, CAPILLARY
GLUCOSE-CAPILLARY: 306 mg/dL — AB (ref 70–99)
GLUCOSE-CAPILLARY: 195 mg/dL — AB (ref 70–99)
GLUCOSE-CAPILLARY: 128 mg/dL — AB (ref 70–99)
Glucose-Capillary: 264 mg/dL — ABNORMAL HIGH (ref 70–99)

## 2018-07-21 MED ORDER — INSULIN GLARGINE 100 UNIT/ML ~~LOC~~ SOLN: 18.0000 [IU] | Freq: Every day | SUBCUTANEOUS | 11 refills | Status: DC

## 2018-07-21 MED ORDER — INSULIN GLARGINE 100 UNIT/ML ~~LOC~~ SOLN
18.0000 [IU] | Freq: Every day | SUBCUTANEOUS | Status: DC
  Administered 2018-07-21 – 2018-07-22 (×2): 18 [IU] via SUBCUTANEOUS
  Filled 2018-07-21 (×2): qty 0.18

## 2018-07-21 NOTE — Progress Notes (Signed)
Inpatient Rehabilitation  Peer to peer review requested and provided [employer] with Dr. Patel's name and number.  Await them to call to complete peer to peer.  Plan to update team when I have a determination.  Call if questions.   Tamma Brigandi, M.A., CCC/SLP Admission Coordinator  [facility]  Cell [phone]  

## 2018-07-21 NOTE — Plan of Care (Signed)
  Problem: Education: Goal: Knowledge of General Education information will improve Description: Including pain rating scale, medication(s)/side effects and non-pharmacologic comfort measures Outcome: Progressing   Problem: Clinical Measurements: Goal: Ability to maintain clinical measurements within normal limits will improve Outcome: Progressing   Problem: Activity: Goal: Risk for activity intolerance will decrease Outcome: Progressing   Problem: Safety: Goal: Ability to remain free from injury will improve Outcome: Progressing   Problem: Skin Integrity: Goal: Risk for impaired skin integrity will decrease Outcome: Progressing   

## 2018-07-21 NOTE — Progress Notes (Signed)
Patient ID: [name], male   DOB: [birth year], 83 y.o.   MRN: [id number] Possible discharge to CIR today, no drainage in [facility]

## 2018-07-21 NOTE — Discharge Summary (Signed)
Discharge Diagnoses:  Active Problems:   Below knee amputation status, left (HCC)   Stage 3 chronic kidney disease (HCC)   Coronary artery disease involving coronary bypass graft of native heart without angina pectoris   Poorly controlled type 2 diabetes mellitus (HCC)   Congestive heart failure (HCC)   Postoperative pain   Hyperkalemia   Acute blood loss anemia   Surgeries: Procedure(s): LEFT BELOW KNEE AMPUTATION on 07/16/2018    Consultants:   Discharged Condition: Improved  Hospital Course: [name] is an 83 y.o. male who was admitted 07/16/2018 with a chief complaint of gangrene left foot, with a final diagnosis of Gangrene Left Foot.  Patient was brought to the operating room on 07/16/2018 and underwent Procedure(s): LEFT BELOW KNEE AMPUTATION.    Patient was given perioperative antibiotics:  Anti-infectives (From admission, onward)   Start     Dose/Rate Route Frequency Ordered Stop   07/16/18 1530  ceFAZolin (ANCEF) IVPB 1 g/50 mL premix     1 g 100 mL/hr over 30 Minutes Intravenous Every 12 hours 07/16/18 1308 07/16/18 2239   07/16/18 1215  ceFAZolin (ANCEF) IVPB 2g/100 mL premix     2 g 200 mL/hr over 30 Minutes Intravenous On call to O.R. 07/16/18 1048 07/16/18 1239   07/16/18 1033  ceFAZolin (ANCEF) 2-4 GM/100ML-% IVPB    Note to Pharmacy:  [name]   : cabinet override      07/16/18 1033 07/16/18 1239    .  Patient was given sequential compression devices, early ambulation, and aspirin for DVT prophylaxis.  Recent vital signs:  Patient Vitals for the past 24 hrs:  BP Temp Temp src Pulse Resp SpO2  07/21/18 0505 120/64 98.2 F (36.8 C) Oral 60 14 92 %  07/20/18 2124 (!) 119/52 98.4 F (36.9 C) Oral (!) 57 18 91 %  07/20/18 1532 (!) 140/57 - - (!) 58 - -  07/20/18 1440 (!) 137/104 98.1 F (36.7 C) Oral 60 20 93 %  .  Recent laboratory studies: No results found.  Discharge Medications:   Allergies as of 07/21/2018   No Known Allergies      Medication List    STOP taking these medications   silver sulfADIAZINE 1 % cream Commonly known as:  SILVADENE     TAKE these medications   acetaminophen 325 MG tablet Commonly known as:  TYLENOL Take 2 tablets (650 mg total) by mouth every 6 (six) hours as needed for mild pain (or Fever >/= 101).   amLODipine 5 MG tablet Commonly known as:  NORVASC Take 5 mg by mouth daily.   aspirin 81 MG tablet Take 81 mg by mouth at bedtime.   atorvastatin 80 MG tablet Commonly known as:  LIPITOR Take 80 mg by mouth at bedtime.   furosemide 40 MG tablet Commonly known as:  LASIX Take 80 mg by mouth daily.   HUMULIN 70/30 (70-30) 100 UNIT/ML injection Generic drug:  insulin NPH-regular Human Inject 0-6 Units into the skin 3 (three) times daily.   insulin glargine 100 UNIT/ML injection Commonly known as:  LANTUS Inject 0.18 mLs (18 Units total) into the skin at bedtime. What changed:    how much to take  when to take this   levothyroxine 50 MCG tablet Commonly known as:  SYNTHROID, LEVOTHROID Take 50 mcg by mouth daily before breakfast.   nitroGLYCERIN 0.2 mg/hr patch Commonly known as:  NITRODUR - Dosed in mg/24 hr Place 1 patch (0.2 mg total) onto the  skin daily.   ondansetron 4 MG tablet Commonly known as:  ZOFRAN Take 1 tablet (4 mg total) by mouth every 6 (six) hours as needed for nausea.   oxyCODONE 5 MG immediate release tablet Commonly known as:  Oxy IR/ROXICODONE Take 1 tablet (5 mg total) by mouth every 4 (four) hours as needed for breakthrough pain. What changed:  how much to take   oxyCODONE 5 MG immediate release tablet Commonly known as:  Oxy IR/ROXICODONE Take 1 tablet (5 mg total) by mouth every 6 (six) hours as needed for moderate pain. What changed:  Another medication with the same name was changed. Make sure you understand how and when to take each.   oxyCODONE-acetaminophen 5-325 MG tablet Commonly known as:  PERCOCET/ROXICET Take 1 tablet by  mouth every 4 (four) hours as needed for severe pain.   potassium chloride 10 MEQ tablet Commonly known as:  K-DUR,KLOR-CON Take 10 mEq by mouth daily.   senna 8.6 MG Tabs tablet Commonly known as:  SENOKOT Take 1 tablet (8.6 mg total) by mouth daily.       Diagnostic Studies: No results found.  Patient benefited maximally from their hospital stay and there were no complications.     Disposition: Discharge disposition: 02-Transferred to [facility]      Discharge Instructions    Call MD / Call 911   Complete by:  As directed    If you experience chest pain or shortness of breath, CALL 911 and be transported to the hospital emergency room.  If you develope a fever above 101 F, pus (white drainage) or increased drainage or redness at the wound, or calf pain, call your surgeon's office.   Constipation Prevention   Complete by:  As directed    Drink plenty of fluids.  Prune juice may be helpful.  You may use a stool softener, such as Colace (over the counter) 100 mg twice a day.  Use MiraLax (over the counter) for constipation as needed.   Diet - low sodium heart healthy   Complete by:  As directed    Increase activity slowly as tolerated   Complete by:  As directed    Negative Pressure Wound Therapy - Incisional   Complete by:  As directed      Follow-up Information    [name], MD In 1 week.   Specialty:  Orthopedic Surgery Contact information: [address] [place] [address] [phone]            Signed: [name] 07/21/2018, 6:33 AM

## 2018-07-21 NOTE — Progress Notes (Signed)
Physical Therapy Treatment Patient Details Name: [name] MRN: [id number] DOB: 1935/05/15 Today's Date: 07/21/2018    History of Present Illness [name] is an 83 y.o. M s/p L BKA with PMH of CAD, CKD - III, T2DM, CHF, and MI.    PT Comments    Pt performed bed mobility, sts transfer and stand pivot from bed to recliner chair.  Much improved from previous session but remains to require min to moderated assistance.  Plan for aggressive CIR therapies remain appropriate as patient is motivated and tolerating mobility well.  He is eager to regain function and return home.    Follow Up Recommendations  CIR;Supervision for mobility/OOB     Equipment Recommendations  Other (comment)(defer to next venue of care.  )    Recommendations for Other Services       Precautions / Restrictions Precautions Precautions: Fall Precaution Comments: wound vac Required Braces or Orthoses: Other Brace/Splint Other Brace/Splint: limb guard on L LE Restrictions Weight Bearing Restrictions: Yes LLE Weight Bearing: Non weight bearing    Mobility  Bed Mobility Overal bed mobility: Needs Assistance Bed Mobility: Supine to Sit     Supine to sit: Min guard;HOB elevated     General bed mobility comments: Use of railing and cues for hand placement to achieve sitting edge of bed.  Pt slow and guarded due to pain.    Transfers Overall transfer level: Needs assistance Equipment used: None Transfers: Sit to/from [facility] Sit to Stand: Min assist Stand pivot transfers: Mod assist       General transfer comment: Cues for hand placement to and from seated surface.  Pt able to stand with erect posture.  Moderate assistance to pivot and manage moving RW.    Ambulation/Gait Ambulation/Gait assistance: (NT)               Stairs             Wheelchair Mobility    Modified Rankin (Stroke Patients Only)       Balance Overall balance assessment: Needs  assistance Sitting-balance support: Bilateral upper extremity supported;Feet unsupported Sitting balance-Leahy Scale: Fair       Standing balance-Leahy Scale: Poor                              Cognition Arousal/Alertness: Awake/alert Behavior During Therapy: WFL for tasks assessed/performed Overall Cognitive Status: Within Functional Limits for tasks assessed                                 General Comments: Pt is pleasant and motivated to get moving      Exercises Amputee Exercises Quad Sets: Left;10 reps;Supine;AROM Hip ABduction/ADduction: AROM;Left;10 reps;Supine Knee Flexion: AROM;Left;10 reps;Supine Knee Extension: AROM;Left;10 reps;Supine Straight Leg Raises: AROM;Left;10 reps;Supine    General Comments        Pertinent Vitals/Pain Pain Assessment: 0-10 Pain Score: 5  Pain Location: L residual limb Pain Descriptors / Indicators: Grimacing;Guarding Pain Intervention(s): Monitored during session;Repositioned    Home Living                      Prior Function            PT Goals (current goals can now be found in the care plan section) Acute Rehab PT Goals Patient Stated Goal: To go to [place] with family in September Potential  to Achieve Goals: Good Progress towards PT goals: Progressing toward goals    Frequency    Min 3X/week      PT Plan Current plan remains appropriate    Co-evaluation              AM-PAC PT "6 Clicks" Daily Activity  Outcome Measure  Difficulty turning over in bed (including adjusting bedclothes, sheets and blankets)?: Unable Difficulty moving from lying on back to sitting on the side of the bed? : Unable Difficulty sitting down on and standing up from a chair with arms (e.g., wheelchair, bedside commode, etc,.)?: Unable Help needed moving to and from a bed to chair (including a wheelchair)?: A Lot Help needed walking in hospital room?: A Lot Help needed climbing 3-5 steps  with a railing? : A Lot 6 Click Score: 9    End of Session Equipment Utilized During Treatment: Gait belt Activity Tolerance: Patient tolerated treatment well Patient left: in chair;with call bell/phone within reach;with chair alarm set;with nursing/sitter in room Nurse Communication: Mobility status;Other (comment) PT Visit Diagnosis: Other abnormalities of gait and mobility (R26.89);Muscle weakness (generalized) (M62.81);Difficulty in walking, not elsewhere classified (R26.2);Pain Pain - Right/Left: Left Pain - part of body: Leg     Time: 1204-1227 PT Time Calculation (min) (ACUTE ONLY): 23 min  Charges:  $Gait Training: 8-22 mins $Therapeutic Exercise: 8-22 mins                     Governor Rooks, PTA pager [phone]    Cristela Blue 07/21/2018, 12:43 PM

## 2018-07-22 LAB — GLUCOSE, CAPILLARY
GLUCOSE-CAPILLARY: 205 mg/dL — AB (ref 70–99)
GLUCOSE-CAPILLARY: 243 mg/dL — AB (ref 70–99)
GLUCOSE-CAPILLARY: 219 mg/dL — AB (ref 70–99)
GLUCOSE-CAPILLARY: 224 mg/dL — AB (ref 70–99)

## 2018-07-22 NOTE — Progress Notes (Signed)
Patient ID: [name], male   DOB: 1935/02/07, 83 y.o.   MRN: [id number] Patient resting comfortable this morning.  There is still no drainage in the wound VAC canister.  Still awaiting authorization for discharge to inpatient rehab.

## 2018-07-22 NOTE — Progress Notes (Signed)
2330 Bedside report received, pt sleeping, easy to arouse, pt turned and repositioned. Wound vac to left stump checked and verified. Pt assessed,see flowsheet, NAD, no complaints. WCTM.

## 2018-07-22 NOTE — Plan of Care (Signed)
  Problem: Activity: Goal: Risk for activity intolerance will decrease Outcome: Progressing   Problem: Safety: Goal: Ability to remain free from injury will improve Outcome: Progressing   Problem: Skin Integrity: Goal: Risk for impaired skin integrity will decrease Outcome: Progressing   

## 2018-07-22 NOTE — Progress Notes (Signed)
Inpatient Rehabilitation  I have received a denial from [facility] for IP Rehab notified patient and family.  They stated that they prefer SNF at [facility] and want to know when they will know if they have a bed.  Called and left message for assigned CSW, Michiel Cowboy to notify of above.  Will sign off at this time.  Call if questions.    Carmelia Roller., CCC/SLP Admission Coordinator  [facility]  Cell [phone]

## 2018-07-22 NOTE — Care Management (Signed)
CIR waiting for authorization for patient. Social worker has several SNF beds as backup should patient be denied.

## 2018-07-22 NOTE — Progress Notes (Signed)
Inpatient Rehabilitation  Continue to await [employer] to call and complete peer to peer review with Dr. Patel.  Per [employer] they have until end of day Friday to complete and cannot provide any other timeline.  Plan to update team when I have a determination.  Call if questions.   Corbin Falck, M.A., CCC/SLP Admission Coordinator  [facility]  Cell [phone] 

## 2018-07-23 DIAGNOSIS — L8990 Pressure ulcer of unspecified site, unspecified stage: Secondary | ICD-10-CM

## 2018-07-23 LAB — GLUCOSE, CAPILLARY
GLUCOSE-CAPILLARY: 224 mg/dL — AB (ref 70–99)
Glucose-Capillary: 257 mg/dL — ABNORMAL HIGH (ref 70–99)
Glucose-Capillary: 173 mg/dL — ABNORMAL HIGH (ref 70–99)
Glucose-Capillary: 203 mg/dL — ABNORMAL HIGH (ref 70–99)

## 2018-07-23 MED ORDER — INSULIN ASPART 100 UNIT/ML ~~LOC~~ SOLN
8.0000 [IU] | Freq: Three times a day (TID) | SUBCUTANEOUS | Status: DC
  Administered 2018-07-23 – 2018-07-24 (×4): 8 [IU] via SUBCUTANEOUS

## 2018-07-23 MED ORDER — INSULIN GLARGINE 100 UNIT/ML ~~LOC~~ SOLN
22.0000 [IU] | Freq: Every day | SUBCUTANEOUS | Status: DC
  Administered 2018-07-23: 22 [IU] via SUBCUTANEOUS
  Filled 2018-07-23 (×2): qty 0.22

## 2018-07-23 NOTE — Progress Notes (Signed)
OT Cancellation Note  Patient Details Name: LJ MIYAMOTO MRN: [id number] DOB: 08/29/1935   Cancelled Treatment:    Reason Eval/Treat Not Completed: Pain limiting ability to participate. "I'm sorry, I just can't do it. I just finished my exercises with PT and I'm hurting." Pt reports he is d/c to SNF for rehab this afternoon.  Hortencia Pilar 07/23/2018, 11:44 AM

## 2018-07-23 NOTE — Progress Notes (Signed)
Inpatient Diabetes Program Recommendations  AACE/ADA: New Consensus Statement on Inpatient Glycemic Control (2015)  Target Ranges:  Prepandial:   less than 140 mg/dL      Peak postprandial:   less than 180 mg/dL (1-2 hours)      Critically ill patients:  140 - 180 mg/dL   Results for [name], [name] (MRN [id number]) as of 07/23/2018 09:01  Ref. Range 07/22/2018 06:58 07/22/2018 11:42 07/22/2018 16:49 07/22/2018 21:37 07/23/2018 06:52  Glucose-Capillary Latest Ref Range: 70 - 99 mg/dL 205 (H) 243 (H) 219 (H) 224 (H) 257 (H)   Review of Glycemic Control  Diabetes history: DM 2 Outpatient Diabetes medications: Lantus 25 units BID Current orders for Inpatient glycemic control: Lantus 18 units qhs, Novolog 0-15 units tid, Novolog 4 units tid meal coverage  Inpatient Diabetes Program Recommendations:    Glucose trends elevated in the 200's still. Patient takes more long acting insulin at home.   Please consider increasing   -Lantus to 22 units qhs -Novolog meal coverage to 8 units tid if eats 50%  Thanks,  Tama Headings RN, MSN, BC-ADM Inpatient Diabetes Coordinator Team Pager [phone] (8a-5p)

## 2018-07-23 NOTE — Progress Notes (Signed)
Physical Therapy Treatment Patient Details Name: [name] MRN: [id number] DOB: 1935-10-07 Today's Date: 07/23/2018    History of Present Illness [name] is an 83 y.o. M s/p L BKA with PMH of CAD, CKD - III, T2DM, CHF, and MI.    PT Comments    Pt performed supine and R sidelying exercises to encourage strength and ROM in L LE.  Pt reports pain improved post session from a 9/10 to 7/10, ice applied to L residual limb to assist with pain.  Pt refused OOB mobility but did perform rolling to R and L side with cues for technique.  Updated recommendations to SNF for continued rehab to improve strength and function before returning home.     Follow Up Recommendations  Supervision for mobility/OOB;SNF(SNF recommendation due to CIR insurance denial)     Equipment Recommendations  Other (comment)(defer to next venue of care.  )    Recommendations for Other Services OT consult     Precautions / Restrictions Precautions Precautions: Fall Precaution Comments: wound vac Required Braces or Orthoses: Other Brace/Splint Other Brace/Splint: limb guard on L LE Restrictions Weight Bearing Restrictions: Yes LLE Weight Bearing: Non weight bearing    Mobility  Bed Mobility Overal bed mobility: Needs Assistance Bed Mobility: Rolling Rolling: Min assist         General bed mobility comments: pt performed rolling into R sidelying for therapeutic exercises.  Pt performed rolling back to left side for bed pad change.  Cues for hand placement and assistance to roll into sidelying  Transfers Overall transfer level: (Pt refused OOB due to pain.  )                  Ambulation/Gait                 Stairs             Wheelchair Mobility    Modified Rankin (Stroke Patients Only)       Balance                                            Cognition Arousal/Alertness: Awake/alert Behavior During Therapy: WFL for tasks  assessed/performed Overall Cognitive Status: Within Functional Limits for tasks assessed                                 General Comments: Pt is pleasant and motivated to get moving      Exercises Amputee Exercises Quad Sets: Left;10 reps;Supine;AROM Hip Extension: AROM;Left;10 reps;Sidelying Hip ABduction/ADduction: AROM;Left;10 reps;Sidelying Knee Flexion: AROM;Left;10 reps;Supine Knee Extension: AROM;Left;10 reps;Supine Straight Leg Raises: AROM;Left;10 reps;Supine    General Comments        Pertinent Vitals/Pain Pain Assessment: 0-10 Pain Score: 9  Pain Location: L residual limb Pain Descriptors / Indicators: Grimacing;Guarding Pain Intervention(s): Ice applied;Monitored during session;Repositioned    Home Living                      Prior Function            PT Goals (current goals can now be found in the care plan section) Acute Rehab PT Goals Patient Stated Goal: To go to [place] with family in September Potential to Achieve Goals: Good Progress towards PT goals: Progressing toward goals    Frequency  Min 3X/week      PT Plan Discharge plan needs to be updated    Co-evaluation              AM-PAC PT "6 Clicks" Daily Activity  Outcome Measure  Difficulty turning over in bed (including adjusting bedclothes, sheets and blankets)?: Unable Difficulty moving from lying on back to sitting on the side of the bed? : Unable Difficulty sitting down on and standing up from a chair with arms (e.g., wheelchair, bedside commode, etc,.)?: Unable Help needed moving to and from a bed to chair (including a wheelchair)?: A Lot Help needed walking in hospital room?: A Lot Help needed climbing 3-5 steps with a railing? : Total 6 Click Score: 8    End of Session Equipment Utilized During Treatment: Gait belt Activity Tolerance: Patient tolerated treatment well Patient left: with call bell/phone within reach;with nursing/sitter in  room;in bed;with bed alarm set Nurse Communication: Mobility status;Other (comment)(informed nursing of rash on lower back and L flank.  ) PT Visit Diagnosis: Other abnormalities of gait and mobility (R26.89);Muscle weakness (generalized) (M62.81);Difficulty in walking, not elsewhere classified (R26.2);Pain Pain - Right/Left: Left Pain - part of body: Leg     Time: 0940-1003 PT Time Calculation (min) (ACUTE ONLY): 23 min  Charges:  $Therapeutic Exercise: 8-22 mins $Therapeutic Activity: 8-22 mins                     Governor Rooks, PTA pager [phone]    Cristela Blue 07/23/2018, 10:12 AM

## 2018-07-23 NOTE — Progress Notes (Addendum)
Subjective: 7 Days Post-Op Procedure(s) (LRB): LEFT BELOW KNEE AMPUTATION (Left) Patient reports pain as 9 on 0-10 scale.  Just took OxyIR and reports this usually helps.  VAC dressing in place and minimal drainage in canister.  Okay for discharge to SNF when bed available.    Objective: Vital signs in last 24 hours: Temp:  [98.1 F (36.7 C)-98.6 F (37 C)] 98.6 F (37 C) (08/09 0348) Pulse Rate:  [52-62] 52 (08/09 0348) Resp:  [16-18] 16 (08/09 0348) BP: (103-130)/(44-69) 130/44 (08/09 0348) SpO2:  [90 %-95 %] 90 % (08/09 0348)  Intake/Output from previous day: 08/08 0701 - 08/09 0700 In: 1560 [P.O.:1560] Out: 1890 [Urine:1890] Intake/Output this shift: No intake/output data recorded.  No results for input(s): HGB in the last 72 hours. No results for input(s): WBC, RBC, HCT, PLT in the last 72 hours. No results for input(s): NA, K, CL, CO2, BUN, CREATININE, GLUCOSE, CALCIUM in the last 72 hours. No results for input(s): LABPT, INR in the last 72 hours.  Neurologically intact Left BKA with VAC in place and shrinker in place.     Assessment/Plan: 7 Days Post-Op Procedure(s) (LRB): LEFT BELOW KNEE AMPUTATION (Left) Discharge to SNF  Pain Management- OxyIR DM- Increase Lantus and Novolog as per DM coordinator rec's     [name] 07/23/2018, 9:11 AM [phone]

## 2018-07-24 LAB — GLUCOSE, CAPILLARY
GLUCOSE-CAPILLARY: 252 mg/dL — AB (ref 70–99)
GLUCOSE-CAPILLARY: 272 mg/dL — AB (ref 70–99)

## 2018-07-24 MED ORDER — OXYCODONE HCL 5 MG PO TABS: 5.0000 mg | ORAL_TABLET | ORAL | 0 refills | Status: DC | PRN

## 2018-07-24 NOTE — Progress Notes (Signed)
Report called to [facility] in [place] to nurse Tomasa Hosteller. Awaiting transport

## 2018-07-24 NOTE — Clinical Social Work Note (Addendum)
Clinical Social Worker facilitated patient discharge including contacting patient family and facility to confirm patient discharge plans.  Clinical information faxed to facility and family agreeable with plan.  CSW arranged ambulance transport via [place] to [facility]  RN to call [phone] (ask for Abigail Butts) for report prior to discharge.  Clinical Social Worker will sign off for now as social work intervention is no longer needed. Please consult [place] again if new need arises.  Loletha Grayer, MSW [phone]

## 2018-07-24 NOTE — Progress Notes (Signed)
Pt to discharge to SNF, pt needs pain med prescriptions. Spoke with attending advised to call oncall MD.  Call placed to MD office for on call MD to address.  Spoke with Gordy Clement from answering service. CSW aware of above.  [id number]

## 2018-07-24 NOTE — Clinical Social Work Note (Signed)
Pt has received insurance auth to admit to [facility] today. Auth number: [id number]. Rug level: [place], MSW [phone]

## 2018-07-24 NOTE — Discharge Summary (Signed)
Discharge Diagnoses:  Active Problems:   Below knee amputation status, left (HCC)   Stage 3 chronic kidney disease (HCC)   Coronary artery disease involving coronary bypass graft of native heart without angina pectoris   Poorly controlled type 2 diabetes mellitus (HCC)   Congestive heart failure (HCC)   Postoperative pain   Hyperkalemia   Acute blood loss anemia   Pressure injury of skin   Surgeries: Procedure(s): LEFT BELOW KNEE AMPUTATION on 07/16/2018    Consultants:   Discharged Condition: Improved  Hospital Course: [name] is an 83 y.o. male who was admitted 07/16/2018 with a chief complaint of gangrene left foot, with a final diagnosis of Gangrene Left Foot.  Patient was brought to the operating room on 07/16/2018 and underwent Procedure(s): LEFT BELOW KNEE AMPUTATION.    Patient was given perioperative antibiotics:  Anti-infectives (From admission, onward)   Start     Dose/Rate Route Frequency Ordered Stop   07/16/18 1530  ceFAZolin (ANCEF) IVPB 1 g/50 mL premix     1 g 100 mL/hr over 30 Minutes Intravenous Every 12 hours 07/16/18 1308 07/16/18 2239   07/16/18 1215  ceFAZolin (ANCEF) IVPB 2g/100 mL premix     2 g 200 mL/hr over 30 Minutes Intravenous On call to O.R. 07/16/18 1048 07/16/18 1239   07/16/18 1033  ceFAZolin (ANCEF) 2-4 GM/100ML-% IVPB    Note to Pharmacy:  [name]   : cabinet override      07/16/18 1033 07/16/18 1239    .  Patient was given sequential compression devices, early ambulation, and aspirin for DVT prophylaxis.  Recent vital signs:  Patient Vitals for the past 24 hrs:  BP Temp Temp src Pulse Resp SpO2  07/24/18 0521 (!) 139/51 98.3 F (36.8 C) Oral (!) 50 16 93 %  07/23/18 1933 (!) 134/52 98.3 F (36.8 C) Oral (!) 53 16 94 %  07/23/18 1420 (!) 131/50 98.8 F (37.1 C) Oral 61 - 92 %  .  Recent laboratory studies: No results found.  Discharge Medications:   Allergies as of 07/24/2018   No Known Allergies     Medication  List    STOP taking these medications   silver sulfADIAZINE 1 % cream Commonly known as:  SILVADENE     TAKE these medications   acetaminophen 325 MG tablet Commonly known as:  TYLENOL Take 2 tablets (650 mg total) by mouth every 6 (six) hours as needed for mild pain (or Fever >/= 101).   amLODipine 5 MG tablet Commonly known as:  NORVASC Take 5 mg by mouth daily.   aspirin 81 MG tablet Take 81 mg by mouth at bedtime.   atorvastatin 80 MG tablet Commonly known as:  LIPITOR Take 80 mg by mouth at bedtime.   furosemide 40 MG tablet Commonly known as:  LASIX Take 80 mg by mouth daily.   HUMULIN 70/30 (70-30) 100 UNIT/ML injection Generic drug:  insulin NPH-regular Human Inject 0-6 Units into the skin 3 (three) times daily.   insulin glargine 100 UNIT/ML injection Commonly known as:  LANTUS Inject 0.18 mLs (18 Units total) into the skin at bedtime. What changed:    how much to take  when to take this   levothyroxine 50 MCG tablet Commonly known as:  SYNTHROID, LEVOTHROID Take 50 mcg by mouth daily before breakfast.   nitroGLYCERIN 0.2 mg/hr patch Commonly known as:  NITRODUR - Dosed in mg/24 hr Place 1 patch (0.2 mg total) onto the skin daily.  ondansetron 4 MG tablet Commonly known as:  ZOFRAN Take 1 tablet (4 mg total) by mouth every 6 (six) hours as needed for nausea.   oxyCODONE 5 MG immediate release tablet Commonly known as:  Oxy IR/ROXICODONE Take 1 tablet (5 mg total) by mouth every 4 (four) hours as needed for breakthrough pain. What changed:  how much to take   oxyCODONE 5 MG immediate release tablet Commonly known as:  Oxy IR/ROXICODONE Take 1 tablet (5 mg total) by mouth every 6 (six) hours as needed for moderate pain. What changed:  Another medication with the same name was changed. Make sure you understand how and when to take each.   oxyCODONE-acetaminophen 5-325 MG tablet Commonly known as:  PERCOCET/ROXICET Take 1 tablet by mouth every 4  (four) hours as needed for severe pain.   potassium chloride 10 MEQ tablet Commonly known as:  K-DUR,KLOR-CON Take 10 mEq by mouth daily.   senna 8.6 MG Tabs tablet Commonly known as:  SENOKOT Take 1 tablet (8.6 mg total) by mouth daily.       Diagnostic Studies: No results found.  Patient benefited maximally from their hospital stay and there were no complications.     Disposition: Discharge disposition: 03-Skilled Nursing Facility      Discharge Instructions    Call MD / Call 911   Complete by:  As directed    If you experience chest pain or shortness of breath, CALL 911 and be transported to the hospital emergency room.  If you develope a fever above 101 F, pus (white drainage) or increased drainage or redness at the wound, or calf pain, call your surgeon's office.   Call MD / Call 911   Complete by:  As directed    If you experience chest pain or shortness of breath, CALL 911 and be transported to the hospital emergency room.  If you develope a fever above 101 F, pus (white drainage) or increased drainage or redness at the wound, or calf pain, call your surgeon's office.   Constipation Prevention   Complete by:  As directed    Drink plenty of fluids.  Prune juice may be helpful.  You may use a stool softener, such as Colace (over the counter) 100 mg twice a day.  Use MiraLax (over the counter) for constipation as needed.   Constipation Prevention   Complete by:  As directed    Drink plenty of fluids.  Prune juice may be helpful.  You may use a stool softener, such as Colace (over the counter) 100 mg twice a day.  Use MiraLax (over the counter) for constipation as needed.   Diet - low sodium heart healthy   Complete by:  As directed    Diet - low sodium heart healthy   Complete by:  As directed    Increase activity slowly as tolerated   Complete by:  As directed    Increase activity slowly as tolerated   Complete by:  As directed    Negative Pressure Wound Therapy -  Incisional   Complete by:  As directed       Contact information for follow-up providers    Victor Minion, MD In 1 week.   Specialty:  Orthopedic Surgery Contact information: [address] [place] [address] [phone]            Contact information for after-discharge care    [facility] .   Service:  Skilled Nursing Contact information: [address] [place] [phone]                   Signed: Newt Nguyen 07/24/2018, 10:14 AM

## 2018-07-24 NOTE — Clinical Social Work Placement (Signed)
   CLINICAL SOCIAL WORK PLACEMENT  NOTE  Date:  07/24/2018  Patient Details  Name: [name] MRN: [id number] Date of Birth: 1935/06/01  Clinical Social Work is seeking post-discharge placement for this patient at the [place] level of care (*CSW will initial, date and re-position this form in  chart as items are completed):      Patient/family provided with [facility] Work Department's list of facilities offering this level of care within the geographic area requested by the patient (or if unable, by the patient's family).  Yes   Patient/family informed of their freedom to choose among providers that offer the needed level of care, that participate in Medicare, Medicaid or managed care program needed by the patient, have an available bed and are willing to accept the patient.      Patient/family informed of [facility]'s ownership interest in [facility] and [facility] as well as of the fact that they are under no obligation to receive care at these facilities.  PASRR submitted to EDS on       PASRR number received on 07/18/18     Existing PASRR number confirmed on       FL2 transmitted to all facilities in geographic area requested by pt/family on 07/18/18     FL2 transmitted to all facilities within larger geographic area on       Patient informed that his/her managed care company has contracts with or will negotiate with certain facilities, including the following:        Yes   Patient/family informed of bed offers received.  Patient chooses bed at [facility]     Physician recommends and patient chooses bed at      Patient to be transferred to [facility] on 07/24/18.  Patient to be transferred to facility by [facility]     Patient family notified on 07/24/18 of transfer.  Name of family member notified:  Estill Bamberg, grandaughter     PHYSICIAN       Additional Comment:     _______________________________________________ Eileen Stanford, LCSW 07/24/2018, 10:21 AM

## 2018-07-28 ENCOUNTER — Encounter (INDEPENDENT_AMBULATORY_CARE_PROVIDER_SITE_OTHER): Payer: Self-pay | Admitting: Family

## 2018-07-28 ENCOUNTER — Ambulatory Visit (INDEPENDENT_AMBULATORY_CARE_PROVIDER_SITE_OTHER): Payer: Medicare HMO | Admitting: Orthopedic Surgery

## 2018-07-28 ENCOUNTER — Ambulatory Visit (INDEPENDENT_AMBULATORY_CARE_PROVIDER_SITE_OTHER): Payer: Medicare HMO | Admitting: Family

## 2018-07-28 VITALS — Ht 65.0 in | Wt 171.0 lb

## 2018-07-28 DIAGNOSIS — Z89512 Acquired absence of left leg below knee: Secondary | ICD-10-CM

## 2018-07-28 DIAGNOSIS — IMO0002 Reserved for concepts with insufficient information to code with codable children: Secondary | ICD-10-CM

## 2018-07-30 ENCOUNTER — Encounter (INDEPENDENT_AMBULATORY_CARE_PROVIDER_SITE_OTHER): Payer: Self-pay | Admitting: Family

## 2018-07-30 NOTE — Progress Notes (Signed)
Post-Op Visit Note   Patient: [name]           Date of Birth: 08-Jan-1935           MRN: [id number] Visit Date: 07/28/2018 PCP: Medicine, [facility]  Chief Complaint:  Chief Complaint  Patient presents with  . Left Leg - Routine Post Op    07/16/18 BKA    HPI:  HPI The patient is an 83 year old gentleman who presents today 1 week status post left below the knee amputation. Ortho Exam Incision well approximated sutures there is no drainage today.  Moderate swelling.  No erythema no odor no sign of infection.  Visit Diagnoses: No diagnosis found.  Plan: Begin daily Dial soap cleansing.  Dry dressings.  Will follow with biotech for resizing of his limb protector.  Follow-up in the office in 2 weeks for suture removal.  Follow-Up Instructions: No follow-ups on file.   Imaging: No results found.  Orders:  No orders of the defined types were placed in this encounter.  No orders of the defined types were placed in this encounter.    PMFS History: Patient Active Problem List   Diagnosis Date Noted  . Pressure injury of skin 07/23/2018  . Stage 3 chronic kidney disease ([place])   . Coronary artery disease involving coronary bypass graft of native heart without angina pectoris   . Poorly controlled type 2 diabetes mellitus ([place])   . Congestive heart failure ([place])   . Postoperative pain   . Hyperkalemia   . Acute blood loss anemia   . Below knee amputation status, left ([place]) 07/16/2018  . History of transmetatarsal amputation of left foot ([place]) 06/30/2018  . Cellulitis of left foot   . Gangrene of left foot ([place])   . Severe protein-calorie malnutrition ([place])   . Uncontrolled type 2 diabetes mellitus with polyneuropathy ([place])   . PAD (peripheral artery disease) ([place])   . Preoperative cardiovascular examination   . S/P CABG (coronary artery bypass graft)   . Atherosclerosis of native arteries of extremities with gangrene, left leg ([place]) 06/07/2018  .  Atherosclerosis of native arteries of the extremities with ulceration ([place]) 05/26/2018  . Acute respiratory failure with hypoxia ([place])   . Hypothyroidism 01/08/2018  . Nausea and vomiting 12/23/2017  . DM type 2 causing vascular disease ([place]) 05/07/2016  . Essential hypertension, benign 05/07/2016  . Other specified hypothyroidism 05/07/2016  . Bilateral carotid bruits 03/11/2016  . Elevated troponin   . Acute on chronic diastolic CHF (congestive heart failure) ([place])   . Bacteremia 02/24/2016  . Sepsis ([place]) 02/22/2016  . Uncontrolled type 2 diabetes mellitus with stage 4 chronic kidney disease ([place]) 02/22/2016  . Demand ischemia ([place]) 02/22/2016  . UTI (urinary tract infection) 02/22/2016  . Acute renal failure superimposed on stage 3 chronic kidney disease ([place])   . Pyrexia   . Arterial hypotension   . Hypoxia   . UTI (lower urinary tract infection)   . OBESITY, UNSPECIFIED 08/24/2009  . Hyperlipidemia 08/15/2009  . Essential hypertension 08/15/2009  . CAD, ARTERY BYPASS GRAFT 08/15/2009  . Atrial fibrillation ([place]) 08/15/2009   Past Medical History:  Diagnosis Date  . Anemia   . Arthritis   . Carotid artery disease ([place])   . CHF (congestive heart failure) ([place])   . CKD (chronic kidney disease), stage IV ([place])   . Coronary artery disease 2010   Status post coronary artery bypass grafting  . Diabetes mellitus    Type II  .  Dyspnea   . Dysrhythmia   . Foot ulcer ([place])   . History of blood transfusion 05/2018  . Hyperlipidemia   . Hypertension   . Hypothyroidism   . Myocardial infarction ([place]) 2010  . PAD (peripheral artery disease) ([place])   . Postoperative atrial fibrillation ([place])   . Thrombocytopenia ([place])     Family History  Problem Relation Age of Onset  . Heart attack Father   . Heart attack Brother     Past Surgical History:  Procedure Laterality Date  . AMPUTATION Left 06/16/2018   Procedure: LEFT TRANSMETATARSAL AMPUTATION;  Surgeon: [name], MD;   Location: [facility];  Service: Orthopedics;  Laterality: Left;  . AMPUTATION Left 07/16/2018   Procedure: LEFT BELOW KNEE AMPUTATION;  Surgeon: [name], MD;  Location: [facility];  Service: Orthopedics;  Laterality: Left;  . AORTOGRAM Left 06/03/2018   Procedure: ABDOMINAL AORTOGRAM WITH CARBON DIOXIDE LEFT LOWER EXTREMITY RUNOFF;  Surgeon: Conrad Berks, MD;  Location: [facility];  Service: Vascular;  Laterality: Left;  . BELOW KNEE LEG AMPUTATION Left 07/16/2018  . carotid insuff    . CORONARY ARTERY BYPASS GRAFT  2010   LIMA to LAD, SVG to diagonal, SVG to circumflex, marginal, SVG to posterior descending branch.  . FEMORAL-POPLITEAL BYPASS GRAFT Left 06/10/2018   Procedure: LEFT COMMON FEMORAL TO BELOW KNEE POPLITEAL BYPASS GRAFT WITH PROPATEN;  Surgeon: Conrad Darfur, MD;  Location: [facility];  Service: Vascular;  Laterality: Left;   Social History   Occupational History  . Not on file  Tobacco Use  . Smoking status: Never Smoker  . Smokeless tobacco: Never Used  Substance and Sexual Activity  . Alcohol use: Not Currently    Alcohol/week: 0.0 standard drinks  . Drug use: No  . Sexual activity: Not on file

## 2018-08-06 ENCOUNTER — Telehealth (INDEPENDENT_AMBULATORY_CARE_PROVIDER_SITE_OTHER): Payer: Self-pay | Admitting: Orthopedic Surgery

## 2018-08-06 NOTE — Telephone Encounter (Signed)
Received voicemail message from Elsie Lincoln with [facility] needing verbal orders for  To see patient for HHPT once this week and 2 wk 2  The number to contact Ronalee Belts is [phone]

## 2018-08-06 NOTE — Telephone Encounter (Signed)
Right BKA. Called and sw HHPT to advise verbal ok for orders below. To call with questions.

## 2018-08-09 ENCOUNTER — Telehealth (INDEPENDENT_AMBULATORY_CARE_PROVIDER_SITE_OTHER): Payer: Self-pay | Admitting: Orthopedic Surgery

## 2018-08-09 NOTE — Telephone Encounter (Signed)
Otila Kluver at [facility] is requesting to resume care for this patient with nursing orders   1 week 1 2 week 2 1 week 3  CB # [phone]

## 2018-08-09 NOTE — Telephone Encounter (Signed)
Called and sw tina to advised verbal ok for HHPT orders below.

## 2018-08-12 ENCOUNTER — Encounter (INDEPENDENT_AMBULATORY_CARE_PROVIDER_SITE_OTHER): Payer: Self-pay | Admitting: Orthopedic Surgery

## 2018-08-12 ENCOUNTER — Ambulatory Visit (INDEPENDENT_AMBULATORY_CARE_PROVIDER_SITE_OTHER): Payer: Medicare HMO | Admitting: Orthopedic Surgery

## 2018-08-12 VITALS — Ht 65.0 in | Wt 171.0 lb

## 2018-08-12 DIAGNOSIS — IMO0002 Reserved for concepts with insufficient information to code with codable children: Secondary | ICD-10-CM

## 2018-08-12 DIAGNOSIS — Z89512 Acquired absence of left leg below knee: Secondary | ICD-10-CM

## 2018-08-12 NOTE — Progress Notes (Signed)
Office Visit Note   Patient: [name]           Date of Birth: 1935-10-24           MRN: [id number] Visit Date: 08/12/2018              Requested by: Medicine, [facility] [address] [place], [place] [address] PCP: Medicine, [facility]  Chief Complaint  Patient presents with  . Left Leg - Follow-up      HPI: Patient is a 83 year old gentleman is 4 weeks status post left transtibial amputation.  Patient is pleased with his progress.  He has a triple extra-large stump shrinker from [employer]otech and a stump protector.  Assessment & Plan: Visit Diagnoses:  1. Below knee amputation status, left [facility])     Plan: Patient will follow with biotech for a K2 prosthesis.  He will transition to a 2 extra-large stump shrinker.  Continue work on range of motion of his knee.  Follow-Up Instructions: Return in about 4 weeks (around 09/09/2018).   Ortho Exam  Patient is alert, oriented, no adenopathy, well-dressed, normal affect, normal respiratory effort. Examination patient's incision has healed nicely there is no redness no cellulitis no drainage no wound dehiscence no signs of infection.  Imaging: No results found.   Labs: Lab Results  Component Value Date   HGBA1C 8.3 (H) 05/31/2018   HGBA1C 10.4 (H) 12/23/2017   HGBA1C 9.6 03/26/2016   ESRSEDRATE 82 (H) 12/23/2017   CRP 17.9 (H) 12/22/2017   REPTSTATUS 06/10/2018 FINAL 06/07/2018   CULT >=100,000 COLONIES/mL ESCHERICHIA COLI (A) 06/07/2018   LABORGA ESCHERICHIA COLI (A) 06/07/2018     Lab Results  Component Value Date   ALBUMIN 1.9 (L) 06/08/2018   ALBUMIN 2.5 (L) 06/07/2018   ALBUMIN 3.6 01/09/2018    Body mass index is 28.46 kg/m.  Orders:  No orders of the defined types were placed in this encounter.  No orders of the defined types were placed in this encounter.    Procedures: No procedures performed  Clinical Data: No additional findings.  ROS:  All other systems negative, except as noted in the  HPI. Review of Systems  Objective: Vital Signs: Ht 5\' 5"  (1.651 m)   Wt 171 lb (77.6 kg)   BMI 28.46 kg/m   Specialty Comments:  No specialty comments available.  PMFS History: Patient Active Problem List   Diagnosis Date Noted  . Pressure injury of skin 07/23/2018  . Stage 3 chronic kidney disease ([place])   . Coronary artery disease involving coronary bypass graft of native heart without angina pectoris   . Poorly controlled type 2 diabetes mellitus ([place])   . Congestive heart failure ([place])   . Postoperative pain   . Hyperkalemia   . Acute blood loss anemia   . Below knee amputation status, left ([place]) 07/16/2018  . Cellulitis of left foot   . Severe protein-calorie malnutrition ([place])   . Uncontrolled type 2 diabetes mellitus with polyneuropathy ([place])   . PAD (peripheral artery disease) ([place])   . Preoperative cardiovascular examination   . S/P CABG (coronary artery bypass graft)   . Atherosclerosis of native arteries of extremities with gangrene, left leg ([place]) 06/07/2018  . Atherosclerosis of native arteries of the extremities with ulceration ([place]) 05/26/2018  . Acute respiratory failure with hypoxia ([place])   . Hypothyroidism 01/08/2018  . Nausea and vomiting 12/23/2017  . DM type 2 causing vascular disease ([place]) 05/07/2016  . Essential hypertension, benign 05/07/2016  .  Other specified hypothyroidism 05/07/2016  . Bilateral carotid bruits 03/11/2016  . Elevated troponin   . Acute on chronic diastolic CHF (congestive heart failure) ([place])   . Bacteremia 02/24/2016  . Sepsis ([place]) 02/22/2016  . Uncontrolled type 2 diabetes mellitus with stage 4 chronic kidney disease ([place]) 02/22/2016  . Demand ischemia ([place]) 02/22/2016  . UTI (urinary tract infection) 02/22/2016  . Acute renal failure superimposed on stage 3 chronic kidney disease ([place])   . Pyrexia   . Arterial hypotension   . Hypoxia   . UTI (lower urinary tract infection)   . OBESITY, UNSPECIFIED 08/24/2009  .  Hyperlipidemia 08/15/2009  . Essential hypertension 08/15/2009  . CAD, ARTERY BYPASS GRAFT 08/15/2009  . Atrial fibrillation ([place]) 08/15/2009   Past Medical History:  Diagnosis Date  . Anemia   . Arthritis   . Carotid artery disease ([place])   . CHF (congestive heart failure) ([place])   . CKD (chronic kidney disease), stage IV ([place])   . Coronary artery disease 2010   Status post coronary artery bypass grafting  . Diabetes mellitus    Type II  . Dyspnea   . Dysrhythmia   . Foot ulcer ([place])   . History of blood transfusion 05/2018  . Hyperlipidemia   . Hypertension   . Hypothyroidism   . Myocardial infarction ([place]) 2010  . PAD (peripheral artery disease) ([place])   . Postoperative atrial fibrillation ([place])   . Thrombocytopenia ([place])     Family History  Problem Relation Age of Onset  . Heart attack Father   . Heart attack Brother     Past Surgical History:  Procedure Laterality Date  . AMPUTATION Left 06/16/2018   Procedure: LEFT TRANSMETATARSAL AMPUTATION;  Surgeon: [name], MD;  Location: [facility];  Service: Orthopedics;  Laterality: Left;  . AMPUTATION Left 07/16/2018   Procedure: LEFT BELOW KNEE AMPUTATION;  Surgeon: [name], MD;  Location: [facility];  Service: Orthopedics;  Laterality: Left;  . AORTOGRAM Left 06/03/2018   Procedure: ABDOMINAL AORTOGRAM WITH CARBON DIOXIDE LEFT LOWER EXTREMITY RUNOFF;  Surgeon: Conrad Palisades, MD;  Location: [facility];  Service: Vascular;  Laterality: Left;  . BELOW KNEE LEG AMPUTATION Left 07/16/2018  . carotid insuff    . CORONARY ARTERY BYPASS GRAFT  2010   LIMA to LAD, SVG to diagonal, SVG to circumflex, marginal, SVG to posterior descending branch.  . FEMORAL-POPLITEAL BYPASS GRAFT Left 06/10/2018   Procedure: LEFT COMMON FEMORAL TO BELOW KNEE POPLITEAL BYPASS GRAFT WITH PROPATEN;  Surgeon: Conrad Eden, MD;  Location: [facility];  Service: Vascular;  Laterality: Left;   Social History   Occupational History  . Not on file  Tobacco Use  .  Smoking status: Never Smoker  . Smokeless tobacco: Never Used  Substance and Sexual Activity  . Alcohol use: Not Currently    Alcohol/week: 0.0 standard drinks  . Drug use: No  . Sexual activity: Not on file

## 2018-08-25 ENCOUNTER — Telehealth (INDEPENDENT_AMBULATORY_CARE_PROVIDER_SITE_OTHER): Payer: Self-pay

## 2018-08-25 NOTE — Telephone Encounter (Signed)
Angela with [facility] called to clarify shrinker size for patient's left stump.

## 2018-08-26 ENCOUNTER — Telehealth (INDEPENDENT_AMBULATORY_CARE_PROVIDER_SITE_OTHER): Payer: Self-pay | Admitting: Orthopedic Surgery

## 2018-08-26 NOTE — Telephone Encounter (Signed)
Patient's daughter Estill Bamberg) called advised patient need Rx refilled (Oxycodone) The number to contact Estill Bamberg is [phone]   The number to contact patient is [phone] or [phone]

## 2018-08-26 NOTE — Telephone Encounter (Signed)
Rx request 

## 2018-08-27 ENCOUNTER — Other Ambulatory Visit (INDEPENDENT_AMBULATORY_CARE_PROVIDER_SITE_OTHER): Payer: Self-pay | Admitting: Family Medicine

## 2018-08-27 MED ORDER — OXYCODONE HCL 5 MG PO TABS: 5.0000 mg | ORAL_TABLET | ORAL | 0 refills | Status: DC | PRN

## 2018-08-27 NOTE — Telephone Encounter (Signed)
[name] can you have Dr Lorin Mercy address? Thanks-

## 2018-08-27 NOTE — Telephone Encounter (Signed)
Rx sent 

## 2018-08-27 NOTE — Telephone Encounter (Signed)
Please advise on message below.

## 2018-08-27 NOTE — Telephone Encounter (Signed)
Dr Junius Roads, can you advise on this one? Thanks

## 2018-08-27 NOTE — Addendum Note (Signed)
Addended by: Hortencia Pilar on: 08/27/2018 04:23 PM   Modules accepted: Orders

## 2018-08-27 NOTE — Telephone Encounter (Signed)
I will not be back to the office this afternoon. Can you see if one of the other PA's or MD's would prescribe enough to get the patient through the weekend please.  Cleo Villamizar,PA-C

## 2018-09-09 ENCOUNTER — Ambulatory Visit (INDEPENDENT_AMBULATORY_CARE_PROVIDER_SITE_OTHER): Payer: Medicare HMO | Admitting: Physician Assistant

## 2018-09-09 ENCOUNTER — Encounter (INDEPENDENT_AMBULATORY_CARE_PROVIDER_SITE_OTHER): Payer: Self-pay | Admitting: Orthopedic Surgery

## 2018-09-09 VITALS — Ht 65.0 in | Wt 171.0 lb

## 2018-09-09 DIAGNOSIS — E1122 Type 2 diabetes mellitus with diabetic chronic kidney disease: Secondary | ICD-10-CM

## 2018-09-09 DIAGNOSIS — N184 Chronic kidney disease, stage 4 (severe): Secondary | ICD-10-CM

## 2018-09-09 DIAGNOSIS — E1165 Type 2 diabetes mellitus with hyperglycemia: Secondary | ICD-10-CM

## 2018-09-09 DIAGNOSIS — E1142 Type 2 diabetes mellitus with diabetic polyneuropathy: Secondary | ICD-10-CM

## 2018-09-09 DIAGNOSIS — IMO0002 Reserved for concepts with insufficient information to code with codable children: Secondary | ICD-10-CM

## 2018-09-09 DIAGNOSIS — Z89512 Acquired absence of left leg below knee: Secondary | ICD-10-CM

## 2018-09-09 DIAGNOSIS — E43 Unspecified severe protein-calorie malnutrition: Secondary | ICD-10-CM

## 2018-09-09 MED ORDER — OXYCODONE-ACETAMINOPHEN 10-325 MG PO TABS: 1.0000 | ORAL_TABLET | Freq: Four times a day (QID) | ORAL | 0 refills | Status: DC | PRN

## 2018-09-10 NOTE — Progress Notes (Signed)
Office Visit Note   Patient: [name]           Date of Birth: December 15, 1935           MRN: [id number] Visit Date: 09/09/2018              Requested by: Medicine, [facility] Internal [place], [place] [address] PCP: Medicine, [facility]  Chief Complaint  Patient presents with  . Left Foot - Routine Post Op    Left BKA      HPI: Patient is a 83 year old male who is seen for postoperative follow-up following a left below the knee amputation on 07/16/2018.  He has been doing well overall.  He reports some minimal scabbing about the incision line and we discussed that he can use some Shea butter cocoa butter to the scabbed areas to help moisturize.  He is wearing his stump shrinker stocking and being casted for his prosthesis but does not have the prosthesis as yet.  He is working with [employer] for the prosthesis.  He does request some refill on his Percocet for pain.  He reports some pain in the amputation area but reports that he has had chronic pain in multiple other areas and we did discuss that this will be his last prescription from this office and that he needs to follow-up with his primary care physician or pain medicine physician to treat any chronic pain syndromes.  Assessment & Plan: Visit Diagnoses:  1. Below knee amputation status, left   2. Severe protein-calorie malnutrition ([place])   3. Uncontrolled type 2 diabetes mellitus with polyneuropathy ([place])   4. Uncontrolled type 2 diabetes mellitus with stage 4 chronic kidney disease ([place])     Plan: Continue to work with [employer] for prosthesis.  Continue to wear stump shrinker stocking.  May use Shea butter cocoa butter to the incisional area for any residual minimal scabbing.  He was given a prescription for Percocet 10/325 #20 no refill and instructed to follow-up with either his PCP or pain management for chronic pain issues.  He will follow-up here in 2 months.  Follow-Up Instructions: Return in about 2 months (around  11/09/2018).   Ortho Exam  Patient is alert, oriented, no adenopathy, well-dressed, normal affect, normal respiratory effort. The left transtibial amputation site is healing well.  There is some minimal crusting and scabbing which is very minor over the incisional area.  There is no signs of cellulitis.  There is minimal edema.  He has good knee extension and flexion.  Imaging: No results found. No images are attached to the encounter.  Labs: Lab Results  Component Value Date   HGBA1C 8.3 (H) 05/31/2018   HGBA1C 10.4 (H) 12/23/2017   HGBA1C 9.6 03/26/2016   ESRSEDRATE 82 (H) 12/23/2017   CRP 17.9 (H) 12/22/2017   REPTSTATUS 06/10/2018 FINAL 06/07/2018   CULT >=100,000 COLONIES/mL ESCHERICHIA COLI (A) 06/07/2018   LABORGA ESCHERICHIA COLI (A) 06/07/2018     Lab Results  Component Value Date   ALBUMIN 1.9 (L) 06/08/2018   ALBUMIN 2.5 (L) 06/07/2018   ALBUMIN 3.6 01/09/2018    Body mass index is 28.46 kg/m.  Orders:  No orders of the defined types were placed in this encounter.  Meds ordered this encounter  Medications  . oxyCODONE-acetaminophen (PERCOCET) 10-325 MG tablet    Sig: Take 1 tablet by mouth every 6 (six) hours as needed for pain.    Dispense:  20 tablet    Refill:  0     Procedures: No procedures performed  Clinical Data: No additional findings.  ROS:  All other systems negative, except as noted in the HPI. Review of Systems  Objective: Vital Signs: Ht 5\' 5"  (1.651 m)   Wt 171 lb (77.6 kg)   BMI 28.46 kg/m   Specialty Comments:  No specialty comments available.  PMFS History: Patient Active Problem List   Diagnosis Date Noted  . Pressure injury of skin 07/23/2018  . Stage 3 chronic kidney disease ([place])   . Coronary artery disease involving coronary bypass graft of native heart without angina pectoris   . Poorly controlled type 2 diabetes mellitus ([place])   . Congestive heart failure ([place])   . Postoperative pain   . Hyperkalemia   .  Acute blood loss anemia   . Below knee amputation status, left ([place]) 07/16/2018  . Cellulitis of left foot   . Severe protein-calorie malnutrition ([place])   . Uncontrolled type 2 diabetes mellitus with polyneuropathy ([place])   . PAD (peripheral artery disease) ([place])   . Preoperative cardiovascular examination   . S/P CABG (coronary artery bypass graft)   . Atherosclerosis of native arteries of extremities with gangrene, left leg ([place]) 06/07/2018  . Atherosclerosis of native arteries of the extremities with ulceration ([place]) 05/26/2018  . Acute respiratory failure with hypoxia ([place])   . Hypothyroidism 01/08/2018  . Nausea and vomiting 12/23/2017  . DM type 2 causing vascular disease ([place]) 05/07/2016  . Essential hypertension, benign 05/07/2016  . Other specified hypothyroidism 05/07/2016  . Bilateral carotid bruits 03/11/2016  . Elevated troponin   . Acute on chronic diastolic CHF (congestive heart failure) ([place])   . Bacteremia 02/24/2016  . Sepsis ([place]) 02/22/2016  . Uncontrolled type 2 diabetes mellitus with stage 4 chronic kidney disease ([place]) 02/22/2016  . Demand ischemia ([place]) 02/22/2016  . UTI (urinary tract infection) 02/22/2016  . Acute renal failure superimposed on stage 3 chronic kidney disease ([place])   . Pyrexia   . Arterial hypotension   . Hypoxia   . UTI (lower urinary tract infection)   . OBESITY, UNSPECIFIED 08/24/2009  . Hyperlipidemia 08/15/2009  . Essential hypertension 08/15/2009  . CAD, ARTERY BYPASS GRAFT 08/15/2009  . Atrial fibrillation ([place]) 08/15/2009   Past Medical History:  Diagnosis Date  . Anemia   . Arthritis   . Carotid artery disease ([place])   . CHF (congestive heart failure) ([place])   . CKD (chronic kidney disease), stage IV ([place])   . Coronary artery disease 2010   Status post coronary artery bypass grafting  . Diabetes mellitus    Type II  . Dyspnea   . Dysrhythmia   . Foot ulcer ([place])   . History of blood transfusion 05/2018  . Hyperlipidemia    . Hypertension   . Hypothyroidism   . Myocardial infarction ([place]) 2010  . PAD (peripheral artery disease) ([place])   . Postoperative atrial fibrillation ([place])   . Thrombocytopenia ([place])     Family History  Problem Relation Age of Onset  . Heart attack Father   . Heart attack Brother     Past Surgical History:  Procedure Laterality Date  . AMPUTATION Left 06/16/2018   Procedure: LEFT TRANSMETATARSAL AMPUTATION;  Surgeon: [name], MD;  Location: [facility];  Service: Orthopedics;  Laterality: Left;  . AMPUTATION Left 07/16/2018   Procedure: LEFT BELOW KNEE AMPUTATION;  Surgeon: [name], MD;  Location: [facility];  Service: Orthopedics;  Laterality: Left;  .  AORTOGRAM Left 06/03/2018   Procedure: ABDOMINAL AORTOGRAM WITH CARBON DIOXIDE LEFT LOWER EXTREMITY RUNOFF;  Surgeon: Conrad Lakeshire, MD;  Location: [facility];  Service: Vascular;  Laterality: Left;  . BELOW KNEE LEG AMPUTATION Left 07/16/2018  . carotid insuff    . CORONARY ARTERY BYPASS GRAFT  2010   LIMA to LAD, SVG to diagonal, SVG to circumflex, marginal, SVG to posterior descending branch.  . FEMORAL-POPLITEAL BYPASS GRAFT Left 06/10/2018   Procedure: LEFT COMMON FEMORAL TO BELOW KNEE POPLITEAL BYPASS GRAFT WITH PROPATEN;  Surgeon: Conrad , MD;  Location: [facility];  Service: Vascular;  Laterality: Left;   Social History   Occupational History  . Not on file  Tobacco Use  . Smoking status: Never Smoker  . Smokeless tobacco: Never Used  Substance and Sexual Activity  . Alcohol use: Not Currently    Alcohol/week: 0.0 standard drinks  . Drug use: No  . Sexual activity: Not on file

## 2018-11-09 ENCOUNTER — Ambulatory Visit (INDEPENDENT_AMBULATORY_CARE_PROVIDER_SITE_OTHER): Payer: Medicare HMO | Admitting: Orthopedic Surgery

## 2018-12-09 ENCOUNTER — Telehealth (INDEPENDENT_AMBULATORY_CARE_PROVIDER_SITE_OTHER): Payer: Self-pay | Admitting: Orthopedic Surgery

## 2018-12-09 NOTE — Telephone Encounter (Signed)
Pt called asking about physical therapy and when can he start that ?  Pt # [phone]

## 2018-12-10 NOTE — Telephone Encounter (Signed)
Please call patient and tell him we need to see him in the office prior to ordering PT for him.  Please schedule appt, thanks.

## 2018-12-10 NOTE — Telephone Encounter (Signed)
Yes, need to see him first. Thanks, [employer] [employer]

## 2018-12-10 NOTE — Telephone Encounter (Signed)
Can you advise?  Patient not seen in a while, do we need to see again first?

## 2018-12-14 ENCOUNTER — Ambulatory Visit (INDEPENDENT_AMBULATORY_CARE_PROVIDER_SITE_OTHER): Payer: Medicare HMO | Admitting: Physician Assistant

## 2018-12-23 ENCOUNTER — Encounter (INDEPENDENT_AMBULATORY_CARE_PROVIDER_SITE_OTHER): Payer: Self-pay | Admitting: Physician Assistant

## 2018-12-23 ENCOUNTER — Ambulatory Visit (INDEPENDENT_AMBULATORY_CARE_PROVIDER_SITE_OTHER): Payer: Medicare HMO | Admitting: Physician Assistant

## 2018-12-23 VITALS — Ht 65.0 in | Wt 171.0 lb

## 2018-12-23 DIAGNOSIS — Z794 Long term (current) use of insulin: Secondary | ICD-10-CM

## 2018-12-23 DIAGNOSIS — E1142 Type 2 diabetes mellitus with diabetic polyneuropathy: Secondary | ICD-10-CM

## 2018-12-23 DIAGNOSIS — N183 Chronic kidney disease, stage 3 unspecified: Secondary | ICD-10-CM

## 2018-12-23 DIAGNOSIS — Z89512 Acquired absence of left leg below knee: Secondary | ICD-10-CM

## 2018-12-23 DIAGNOSIS — E43 Unspecified severe protein-calorie malnutrition: Secondary | ICD-10-CM

## 2018-12-23 NOTE — Progress Notes (Signed)
Office Visit Note   Patient: [name]           Date of Birth: [birth year]           MRN: [id number] Visit Date: 12/23/2018              Requested by: Medicine, [facility] [place], [place] [id number] PCP: Medicine, [facility]  Chief Complaint  Patient presents with  . Left Leg - Follow-up    Physical Therapy      HPI: The patient is an 84 year old male who is seen for postoperative follow-up following a left below the knee amputation on 07/16/2018.  He has been working with [employer] clinic for his prosthesis and he has obtained it and wants to start physical therapy at the [facility] outpatient rehabilitation near where he lives.  He is very pleased with his result.  Assessment & Plan: Visit Diagnoses:  1. Acquired absence of left leg below knee (HCC)   2. Type 2 diabetes mellitus with diabetic polyneuropathy, with long-term current use of insulin (HCC)   3. Stage 3 chronic kidney disease ([place])   4. Severe protein-calorie malnutrition ([place])     Plan: Patient was given physical therapy orders to start amputee gait training.  He does desire to go to [facility] outpatient rehabilitation near where he lives.  He will follow-up here in 2 months  Follow-Up Instructions: Return in about 2 months (around 02/21/2019).   Ortho Exam  Patient is alert, oriented, no adenopathy, well-dressed, normal affect, normal respiratory effort. The left transtibial amputation is well-healed.  There is no signs of ulceration or further skin breakdown.  There is no significant edema of the residual distal limb.  He lacks a few degrees of extension but has good flexion at the knee. Imaging: No results found.   Labs: Lab Results  Component Value Date   HGBA1C 8.3 (H) 05/31/2018   HGBA1C 10.4 (H) 12/23/2017   HGBA1C 9.6 03/26/2016   ESRSEDRATE 82 (H) 12/23/2017   CRP 17.9 (H) 12/22/2017   REPTSTATUS 06/10/2018 FINAL 06/07/2018   CULT >=100,000 COLONIES/mL ESCHERICHIA COLI  (A) 06/07/2018   LABORGA ESCHERICHIA COLI (A) 06/07/2018     Lab Results  Component Value Date   ALBUMIN 1.9 (L) 06/08/2018   ALBUMIN 2.5 (L) 06/07/2018   ALBUMIN 3.6 01/09/2018    Body mass index is 28.46 kg/m.  Orders:  No orders of the defined types were placed in this encounter.  No orders of the defined types were placed in this encounter.    Procedures: No procedures performed  Clinical Data: No additional findings.  ROS:  All other systems negative, except as noted in the HPI. Review of Systems  Objective: Vital Signs: Ht 5\' 5"  (1.651 m)   Wt 171 lb (77.6 kg)   BMI 28.46 kg/m   Specialty Comments:  No specialty comments available.  PMFS History: Patient Active Problem List   Diagnosis Date Noted  . Pressure injury of skin 07/23/2018  . Stage 3 chronic kidney disease ([place])   . Coronary artery disease involving coronary bypass graft of native heart without angina pectoris   . Poorly controlled type 2 diabetes mellitus ([place])   . Congestive heart failure ([place])   . Postoperative pain   . Hyperkalemia   . Acute blood loss anemia   . Below knee amputation status, left 07/16/2018  . Cellulitis of left foot   . Severe protein-calorie malnutrition ([place])   . Uncontrolled type  2 diabetes mellitus with polyneuropathy ([place])   . PAD (peripheral artery disease) ([place])   . Preoperative cardiovascular examination   . S/P CABG (coronary artery bypass graft)   . Atherosclerosis of native arteries of extremities with gangrene, left leg ([place]) 06/07/2018  . Atherosclerosis of native arteries of the extremities with ulceration ([place]) 05/26/2018  . Acute respiratory failure with hypoxia ([place])   . Hypothyroidism 01/08/2018  . Nausea and vomiting 12/23/2017  . DM type 2 causing vascular disease ([place]) 05/07/2016  . Essential hypertension, benign 05/07/2016  . Other specified hypothyroidism 05/07/2016  . Bilateral carotid bruits 03/11/2016  . Elevated troponin   . Acute  on chronic diastolic CHF (congestive heart failure) ([place])   . Bacteremia 02/24/2016  . Sepsis ([place]) 02/22/2016  . Uncontrolled type 2 diabetes mellitus with stage 4 chronic kidney disease ([place]) 02/22/2016  . Demand ischemia ([place]) 02/22/2016  . UTI (urinary tract infection) 02/22/2016  . Acute renal failure superimposed on stage 3 chronic kidney disease ([place])   . Pyrexia   . Arterial hypotension   . Hypoxia   . UTI (lower urinary tract infection)   . OBESITY, UNSPECIFIED 08/24/2009  . Hyperlipidemia 08/15/2009  . Essential hypertension 08/15/2009  . CAD, ARTERY BYPASS GRAFT 08/15/2009  . Atrial fibrillation ([place]) 08/15/2009   Past Medical History:  Diagnosis Date  . Anemia   . Arthritis   . Carotid artery disease ([place])   . CHF (congestive heart failure) ([place])   . CKD (chronic kidney disease), stage IV ([place])   . Coronary artery disease 2010   Status post coronary artery bypass grafting  . Diabetes mellitus    Type II  . Dyspnea   . Dysrhythmia   . Foot ulcer ([place])   . History of blood transfusion 05/2018  . Hyperlipidemia   . Hypertension   . Hypothyroidism   . Myocardial infarction ([place]) 2010  . PAD (peripheral artery disease) ([place])   . Postoperative atrial fibrillation ([place])   . Thrombocytopenia ([place])     Family History  Problem Relation Age of Onset  . Heart attack Father   . Heart attack Brother     Past Surgical History:  Procedure Laterality Date  . AMPUTATION Left 06/16/2018   Procedure: LEFT TRANSMETATARSAL AMPUTATION;  Surgeon: [name], MD;  Location: [place];  Service: Orthopedics;  Laterality: Left;  . AMPUTATION Left 07/16/2018   Procedure: LEFT BELOW KNEE AMPUTATION;  Surgeon: [name], MD;  Location: [place];  Service: Orthopedics;  Laterality: Left;  . AORTOGRAM Left 06/03/2018   Procedure: ABDOMINAL AORTOGRAM WITH CARBON DIOXIDE LEFT LOWER EXTREMITY RUNOFF;  Surgeon: Conrad Porcupine, MD;  Location: [place];  Service: Vascular;  Laterality: Left;  .  BELOW KNEE LEG AMPUTATION Left 07/16/2018  . carotid insuff    . CORONARY ARTERY BYPASS GRAFT  2010   LIMA to LAD, SVG to diagonal, SVG to circumflex, marginal, SVG to posterior descending branch.  . FEMORAL-POPLITEAL BYPASS GRAFT Left 06/10/2018   Procedure: LEFT COMMON FEMORAL TO BELOW KNEE POPLITEAL BYPASS GRAFT WITH PROPATEN;  Surgeon: Conrad SeaTac, MD;  Location: [place];  Service: Vascular;  Laterality: Left;   Social History   Occupational History  . Not on file  Tobacco Use  . Smoking status: Never Smoker  . Smokeless tobacco: Never Used  Substance and Sexual Activity  . Alcohol use: Not Currently    Alcohol/week: 0.0 standard drinks  . Drug use: No  . Sexual activity: Not on file

## 2018-12-24 ENCOUNTER — Encounter (INDEPENDENT_AMBULATORY_CARE_PROVIDER_SITE_OTHER): Payer: Self-pay | Admitting: Physician Assistant

## 2019-02-21 ENCOUNTER — Ambulatory Visit (INDEPENDENT_AMBULATORY_CARE_PROVIDER_SITE_OTHER): Payer: Medicare HMO | Admitting: Physician Assistant

## 2019-07-07 IMAGING — US US ABDOMEN COMPLETE
1 series · 14 of 25 positions shown · non-contrast
Comparison: CT same day

CLINICAL DATA: Nausea and vomiting. Right upper quadrant pain, 2
days duration.

EXAM:
ABDOMEN ULTRASOUND COMPLETE

[Series 1: us abdomen complete · 0.23mm/px · 14 of 87 slices shown]
[im 1/87]
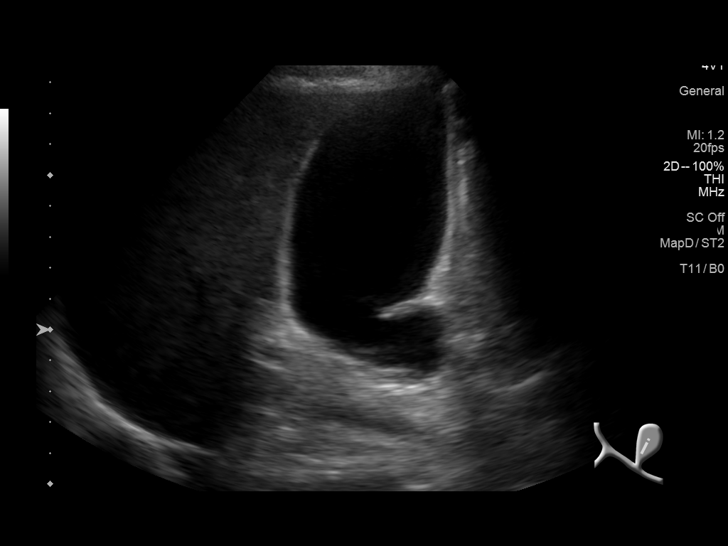
[im 8/87]
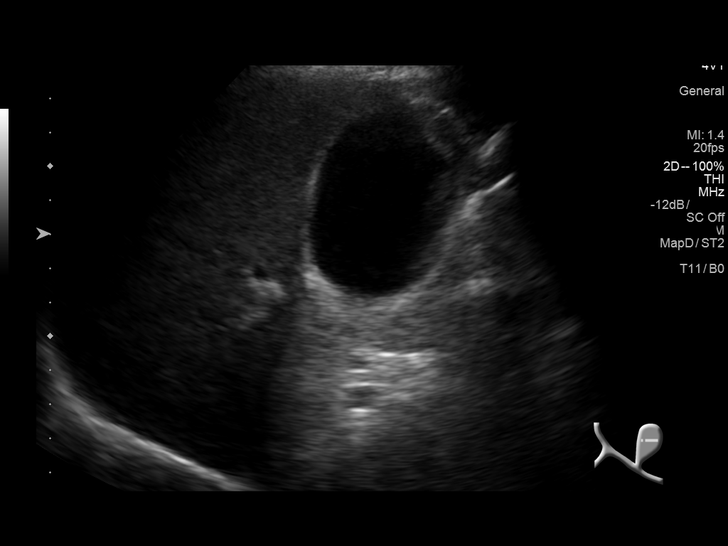
[im 15/87]
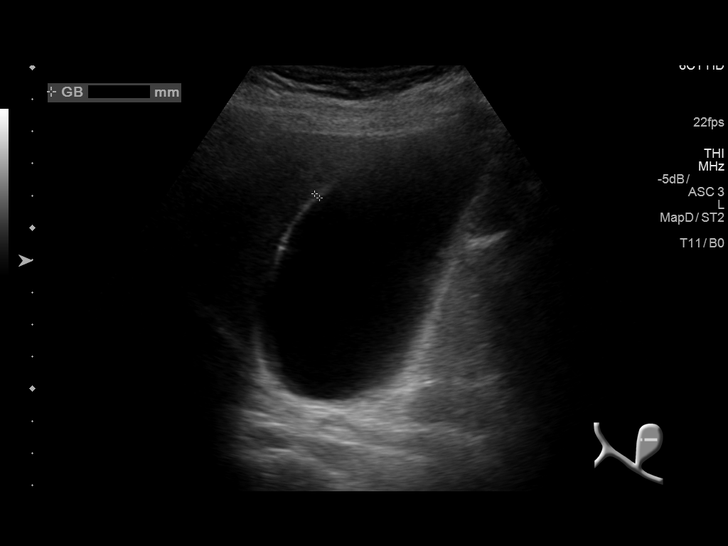
[im 22/87]
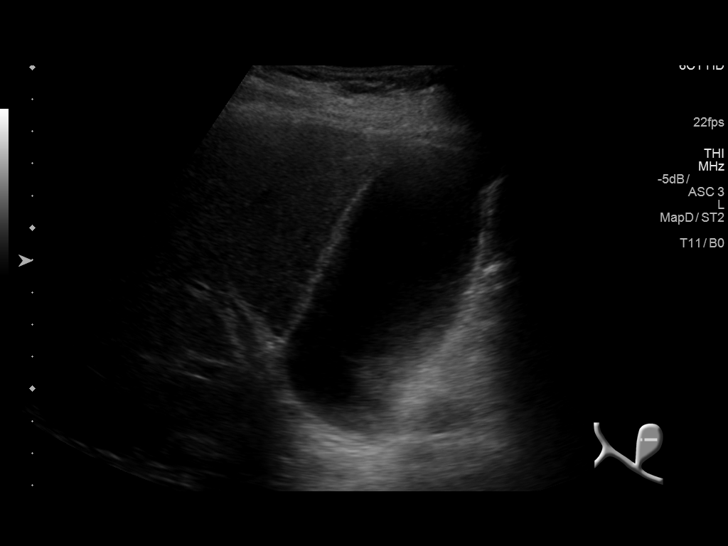
[im 29/87]
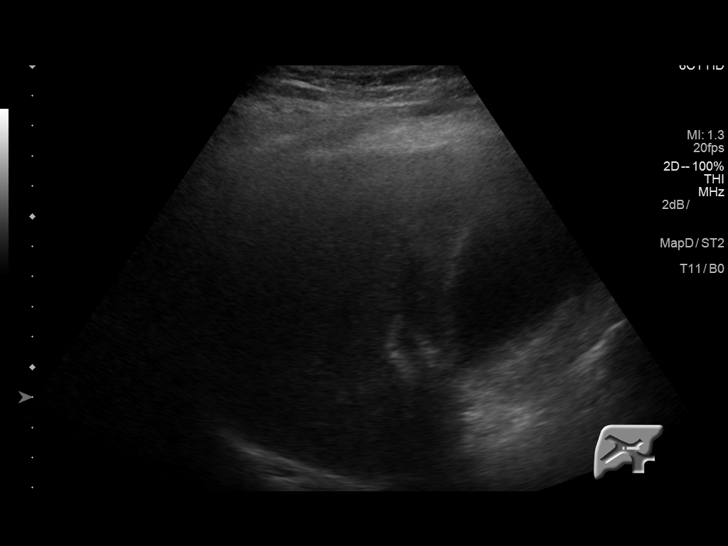
[im 33/87]
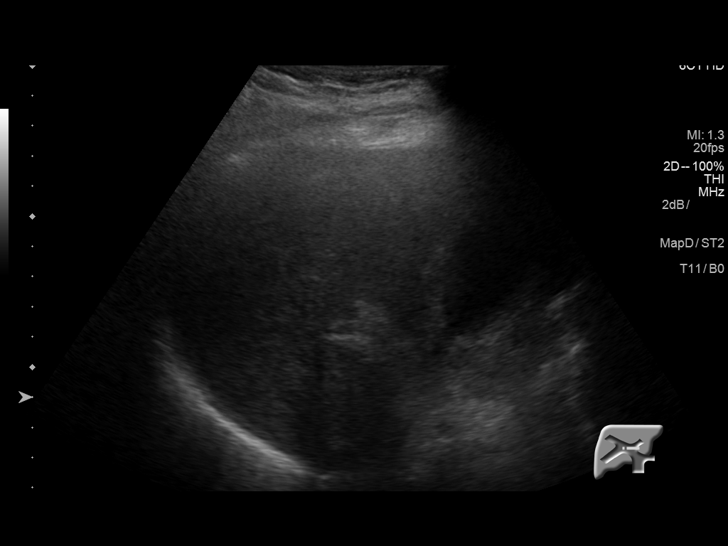
[im 40/87]
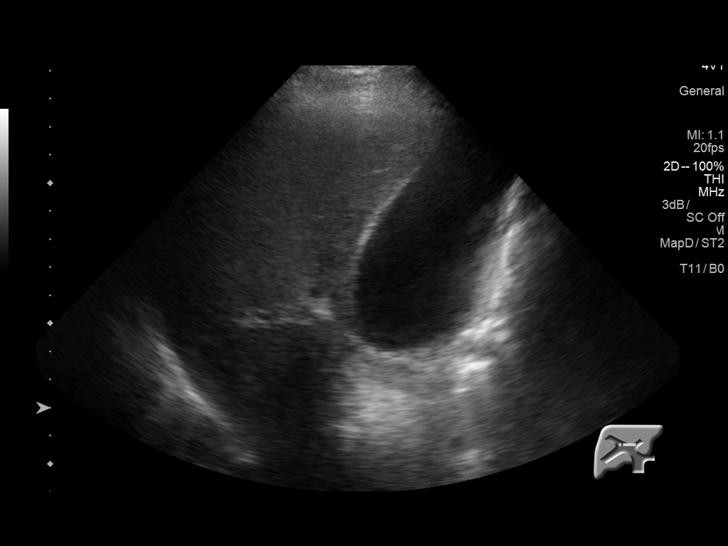
[im 47/87]
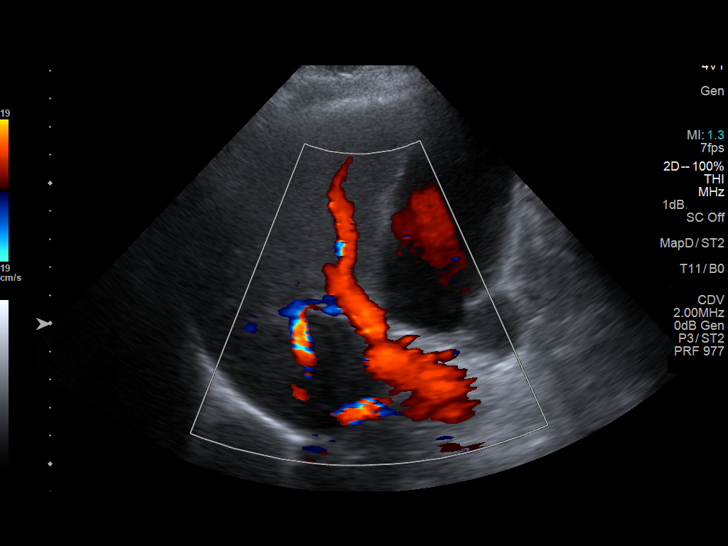
[im 54/87]
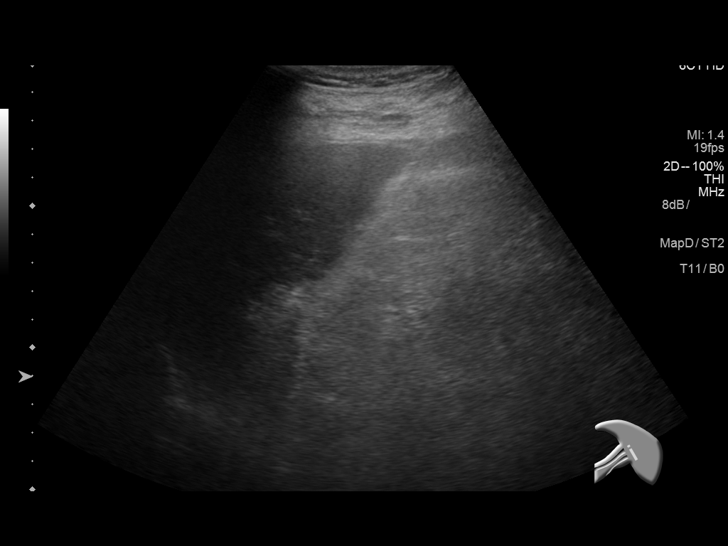
[im 58/87]
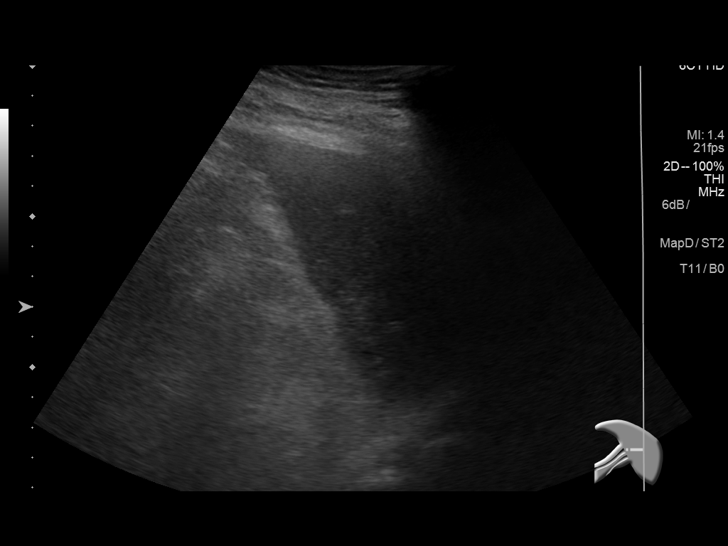
[im 65/87]
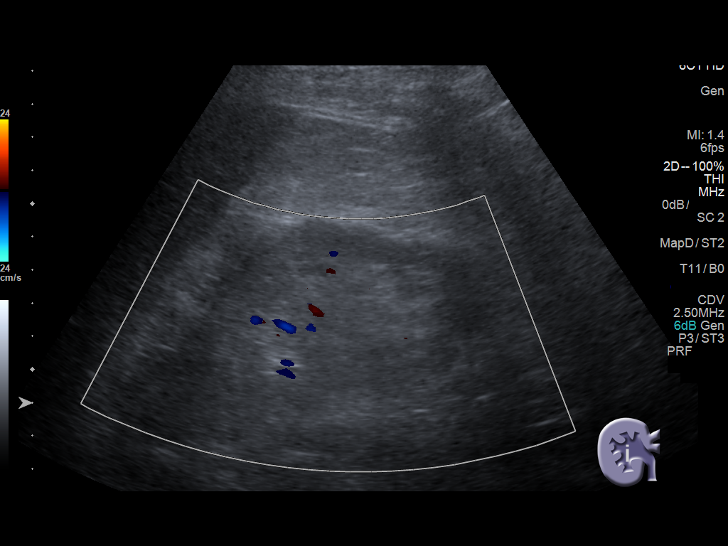
[im 72/87]
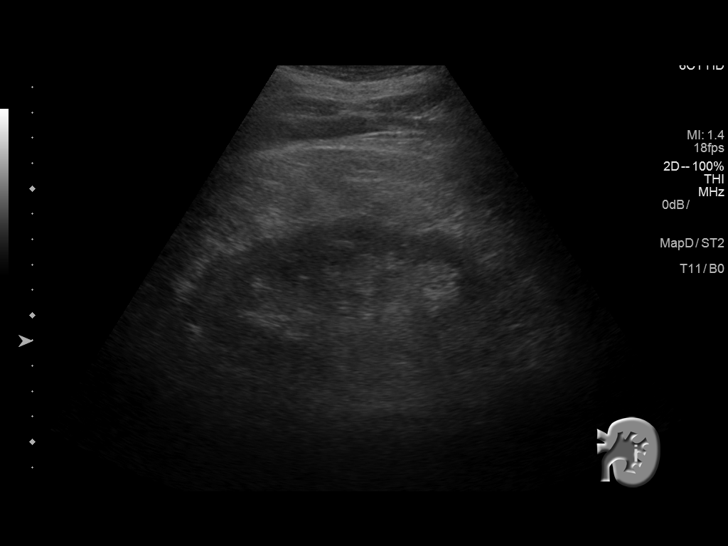
[im 79/87]
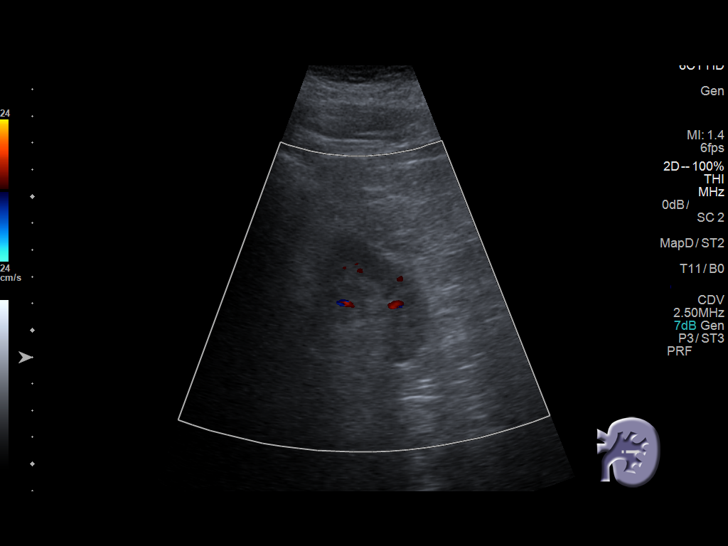
[im 87/87]
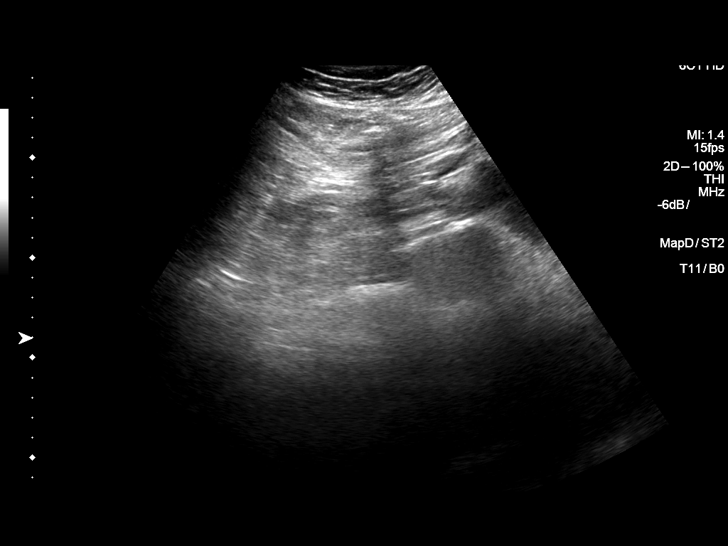

[14 of 25 positions shown; findings below may reference images not displayed]

FINDINGS: Gallbladder: No gallstones or wall thickening visualized. No
sonographic Murphy sign noted by sonographer.

Common bile duct: Diameter: 3 mm, normal

Liver: No focal lesion identified. Within normal limits in
parenchymal echogenicity. Portal vein is patent on color Doppler
imaging with normal direction of blood flow towards the liver.

IVC: No abnormality visualized.

Pancreas: Poorly seen because of overlying bowel gas.

Spleen: Size and appearance within normal limits.

Right Kidney: Length: 11.0 cm. Echogenicity within normal limits. No
mass or hydronephrosis visualized.

Left Kidney: Length: 12.9 cm. Echogenicity within normal limits. No
mass or hydronephrosis visualized.

Abdominal aorta: No aneurysm visualized.

Other findings: None.
IMPRESSION: Normal examination. The gallbladder is full, but there are no stones
and there is no sonographic evidence of cholecystitis.

## 2019-10-14 ENCOUNTER — Telehealth: Payer: Self-pay

## 2019-10-14 NOTE — Telephone Encounter (Signed)
Pts son called and requested an appt. He said that his dad is having issues with his right foot, very similar to the ones he was having with his left before he had to have it amputated. He said that it is swollen, red and scaly  Appt made for pt to be seen Monday.   York Cerise, CMA

## 2019-10-17 ENCOUNTER — Ambulatory Visit: Payer: Medicare HMO | Admitting: Family

## 2019-11-25 ENCOUNTER — Inpatient Hospital Stay (HOSPITAL_COMMUNITY)
Admission: EM | Admit: 2019-11-25 | Discharge: 2019-11-28 | DRG: 291 | Disposition: A | Discharge status: Home Health | Payer: Medicare HMO | Attending: Family Medicine | Admitting: Family Medicine

## 2019-11-25 ENCOUNTER — Other Ambulatory Visit: Payer: Self-pay

## 2019-11-25 ENCOUNTER — Encounter (HOSPITAL_COMMUNITY): Payer: Self-pay | Admitting: *Deleted

## 2019-11-25 ENCOUNTER — Emergency Department (HOSPITAL_COMMUNITY): Payer: Medicare HMO

## 2019-11-25 DIAGNOSIS — Z794 Long term (current) use of insulin: Secondary | ICD-10-CM

## 2019-11-25 DIAGNOSIS — I5033 Acute on chronic diastolic (congestive) heart failure: Secondary | ICD-10-CM | POA: Diagnosis present

## 2019-11-25 DIAGNOSIS — E1122 Type 2 diabetes mellitus with diabetic chronic kidney disease: Secondary | ICD-10-CM | POA: Diagnosis present

## 2019-11-25 DIAGNOSIS — I509 Heart failure, unspecified: Secondary | ICD-10-CM

## 2019-11-25 DIAGNOSIS — Z20828 Contact with and (suspected) exposure to other viral communicable diseases: Secondary | ICD-10-CM | POA: Diagnosis present

## 2019-11-25 DIAGNOSIS — E43 Unspecified severe protein-calorie malnutrition: Secondary | ICD-10-CM | POA: Diagnosis present

## 2019-11-25 DIAGNOSIS — I252 Old myocardial infarction: Secondary | ICD-10-CM

## 2019-11-25 DIAGNOSIS — Z951 Presence of aortocoronary bypass graft: Secondary | ICD-10-CM | POA: Diagnosis not present

## 2019-11-25 DIAGNOSIS — E039 Hypothyroidism, unspecified: Secondary | ICD-10-CM | POA: Diagnosis present

## 2019-11-25 DIAGNOSIS — E785 Hyperlipidemia, unspecified: Secondary | ICD-10-CM | POA: Diagnosis present

## 2019-11-25 DIAGNOSIS — Z66 Do not resuscitate: Secondary | ICD-10-CM | POA: Diagnosis present

## 2019-11-25 DIAGNOSIS — I130 Hypertensive heart and chronic kidney disease with heart failure and stage 1 through stage 4 chronic kidney disease, or unspecified chronic kidney disease: Secondary | ICD-10-CM | POA: Diagnosis present

## 2019-11-25 DIAGNOSIS — N184 Chronic kidney disease, stage 4 (severe): Secondary | ICD-10-CM

## 2019-11-25 DIAGNOSIS — J811 Chronic pulmonary edema: Secondary | ICD-10-CM

## 2019-11-25 DIAGNOSIS — Z79890 Hormone replacement therapy (postmenopausal): Secondary | ICD-10-CM | POA: Diagnosis not present

## 2019-11-25 DIAGNOSIS — J810 Acute pulmonary edema: Secondary | ICD-10-CM

## 2019-11-25 DIAGNOSIS — E1151 Type 2 diabetes mellitus with diabetic peripheral angiopathy without gangrene: Secondary | ICD-10-CM | POA: Diagnosis present

## 2019-11-25 DIAGNOSIS — E1159 Type 2 diabetes mellitus with other circulatory complications: Secondary | ICD-10-CM | POA: Diagnosis present

## 2019-11-25 DIAGNOSIS — Z6829 Body mass index (BMI) 29.0-29.9, adult: Secondary | ICD-10-CM | POA: Diagnosis not present

## 2019-11-25 DIAGNOSIS — I4891 Unspecified atrial fibrillation: Secondary | ICD-10-CM | POA: Diagnosis present

## 2019-11-25 DIAGNOSIS — R0902 Hypoxemia: Secondary | ICD-10-CM | POA: Diagnosis present

## 2019-11-25 DIAGNOSIS — R0602 Shortness of breath: Secondary | ICD-10-CM | POA: Diagnosis not present

## 2019-11-25 DIAGNOSIS — Z7982 Long term (current) use of aspirin: Secondary | ICD-10-CM

## 2019-11-25 DIAGNOSIS — I2510 Atherosclerotic heart disease of native coronary artery without angina pectoris: Secondary | ICD-10-CM | POA: Diagnosis present

## 2019-11-25 DIAGNOSIS — Z89512 Acquired absence of left leg below knee: Secondary | ICD-10-CM | POA: Diagnosis not present

## 2019-11-25 DIAGNOSIS — M1990 Unspecified osteoarthritis, unspecified site: Secondary | ICD-10-CM | POA: Diagnosis present

## 2019-11-25 DIAGNOSIS — E1165 Type 2 diabetes mellitus with hyperglycemia: Secondary | ICD-10-CM | POA: Diagnosis not present

## 2019-11-25 DIAGNOSIS — I10 Essential (primary) hypertension: Secondary | ICD-10-CM | POA: Diagnosis not present

## 2019-11-25 DIAGNOSIS — Z8249 Family history of ischemic heart disease and other diseases of the circulatory system: Secondary | ICD-10-CM

## 2019-11-25 DIAGNOSIS — M7989 Other specified soft tissue disorders: Secondary | ICD-10-CM | POA: Diagnosis present

## 2019-11-25 DIAGNOSIS — I25810 Atherosclerosis of coronary artery bypass graft(s) without angina pectoris: Secondary | ICD-10-CM | POA: Diagnosis present

## 2019-11-25 DIAGNOSIS — I480 Paroxysmal atrial fibrillation: Secondary | ICD-10-CM | POA: Diagnosis not present

## 2019-11-25 LAB — CBC WITH DIFFERENTIAL/PLATELET
Abs Immature Granulocytes: 0.02 10*3/uL (ref 0.00–0.07)
Basophils Absolute: 0 10*3/uL (ref 0.0–0.1)
Basophils Relative: 0 %
Eosinophils Absolute: 0.4 10*3/uL (ref 0.0–0.5)
Eosinophils Relative: 6 %
HCT: 38.1 % — ABNORMAL LOW (ref 39.0–52.0)
Hemoglobin: 11.6 g/dL — ABNORMAL LOW (ref 13.0–17.0)
Immature Granulocytes: 0 %
Lymphocytes Relative: 22 %
Lymphs Abs: 1.5 10*3/uL (ref 0.7–4.0)
MCH: 30.8 pg (ref 26.0–34.0)
MCHC: 30.4 g/dL (ref 30.0–36.0)
MCV: 101.1 fL — ABNORMAL HIGH (ref 80.0–100.0)
Monocytes Absolute: 0.5 10*3/uL (ref 0.1–1.0)
Monocytes Relative: 8 %
Neutro Abs: 4.3 10*3/uL (ref 1.7–7.7)
Neutrophils Relative %: 64 %
Platelets: 130 10*3/uL — ABNORMAL LOW (ref 150–400)
RBC: 3.77 MIL/uL — ABNORMAL LOW (ref 4.22–5.81)
RDW: 13.2 % (ref 11.5–15.5)
WBC: 6.8 10*3/uL (ref 4.0–10.5)
nRBC: 0 % (ref 0.0–0.2)

## 2019-11-25 LAB — BASIC METABOLIC PANEL
Anion gap: 9 (ref 5–15)
BUN: 43 mg/dL — ABNORMAL HIGH (ref 8–23)
CO2: 24 mmol/L (ref 22–32)
Calcium: 8.5 mg/dL — ABNORMAL LOW (ref 8.9–10.3)
Chloride: 106 mmol/L (ref 98–111)
Creatinine, Ser: 2.52 mg/dL — ABNORMAL HIGH (ref 0.61–1.24)
GFR calc Af Amer: 26 mL/min — ABNORMAL LOW (ref >60)
GFR calc non Af Amer: 23 mL/min — ABNORMAL LOW (ref >60)
Glucose, Bld: 145 mg/dL — ABNORMAL HIGH (ref 70–99)
Potassium: 4.3 mmol/L (ref 3.5–5.1)
Sodium: 139 mmol/L (ref 135–145)

## 2019-11-25 LAB — BRAIN NATRIURETIC PEPTIDE: B Natriuretic Peptide: 37 pg/mL (ref 0.0–100.0)

## 2019-11-25 MED ORDER — INSULIN GLARGINE 100 UNIT/ML ~~LOC~~ SOLN
18.0000 [IU] | Freq: Every day | SUBCUTANEOUS | Status: DC
  Administered 2019-11-26 (×2): 18 [IU] via SUBCUTANEOUS
  Filled 2019-11-25 (×2): qty 0.18

## 2019-11-25 MED ORDER — INSULIN ASPART 100 UNIT/ML ~~LOC~~ SOLN
0.0000 [IU] | Freq: Three times a day (TID) | SUBCUTANEOUS | Status: DC
  Administered 2019-11-26: 1 [IU] via SUBCUTANEOUS
  Administered 2019-11-27: 2 [IU] via SUBCUTANEOUS
  Administered 2019-11-27: 1 [IU] via SUBCUTANEOUS
  Administered 2019-11-28: 2 [IU] via SUBCUTANEOUS
  Administered 2019-11-28: 1 [IU] via SUBCUTANEOUS

## 2019-11-25 MED ORDER — OXYCODONE HCL 5 MG PO TABS
5.0000 mg | ORAL_TABLET | ORAL | Status: DC | PRN
  Administered 2019-11-26 – 2019-11-27 (×2): 5 mg via ORAL
  Filled 2019-11-25 (×2): qty 1

## 2019-11-25 MED ORDER — INSULIN ASPART 100 UNIT/ML ~~LOC~~ SOLN
3.0000 [IU] | Freq: Three times a day (TID) | SUBCUTANEOUS | Status: DC
  Administered 2019-11-26: 3 [IU] via SUBCUTANEOUS

## 2019-11-25 MED ORDER — HEPARIN SODIUM (PORCINE) 5000 UNIT/ML IJ SOLN
5000.0000 [IU] | Freq: Three times a day (TID) | INTRAMUSCULAR | Status: DC
  Administered 2019-11-26 – 2019-11-28 (×7): 5000 [IU] via SUBCUTANEOUS
  Filled 2019-11-25 (×8): qty 1

## 2019-11-25 MED ORDER — ATORVASTATIN CALCIUM 40 MG PO TABS
80.0000 mg | ORAL_TABLET | Freq: Every day | ORAL | Status: DC
  Administered 2019-11-25 – 2019-11-27 (×3): 80 mg via ORAL
  Filled 2019-11-25 (×3): qty 2

## 2019-11-25 MED ORDER — SODIUM CHLORIDE 0.9% FLUSH: 3.0000 mL | INTRAVENOUS | Status: DC | PRN

## 2019-11-25 MED ORDER — FUROSEMIDE 10 MG/ML IJ SOLN
40.0000 mg | Freq: Two times a day (BID) | INTRAMUSCULAR | Status: DC
  Administered 2019-11-26 – 2019-11-27 (×3): 40 mg via INTRAVENOUS
  Filled 2019-11-25 (×3): qty 4

## 2019-11-25 MED ORDER — LEVOTHYROXINE SODIUM 50 MCG PO TABS
50.0000 ug | ORAL_TABLET | Freq: Every day | ORAL | Status: DC
  Administered 2019-11-26 – 2019-11-28 (×3): 50 ug via ORAL
  Filled 2019-11-25 (×3): qty 1

## 2019-11-25 MED ORDER — SODIUM CHLORIDE 0.9% FLUSH
3.0000 mL | Freq: Two times a day (BID) | INTRAVENOUS | Status: DC
  Administered 2019-11-25 – 2019-11-28 (×5): 3 mL via INTRAVENOUS

## 2019-11-25 MED ORDER — ACETAMINOPHEN 325 MG PO TABS
650.0000 mg | ORAL_TABLET | Freq: Four times a day (QID) | ORAL | Status: DC | PRN
  Administered 2019-11-26 – 2019-11-27 (×2): 650 mg via ORAL
  Filled 2019-11-25 (×2): qty 2

## 2019-11-25 MED ORDER — SENNA 8.6 MG PO TABS
1.0000 | ORAL_TABLET | Freq: Every day | ORAL | Status: DC
  Administered 2019-11-26 – 2019-11-27 (×2): 8.6 mg via ORAL
  Filled 2019-11-25 (×3): qty 1

## 2019-11-25 MED ORDER — ASPIRIN EC 81 MG PO TBEC
81.0000 mg | DELAYED_RELEASE_TABLET | Freq: Every day | ORAL | Status: DC
  Administered 2019-11-25 – 2019-11-27 (×3): 81 mg via ORAL
  Filled 2019-11-25 (×3): qty 1

## 2019-11-25 MED ORDER — ONDANSETRON HCL 4 MG PO TABS: 4.0000 mg | ORAL_TABLET | Freq: Four times a day (QID) | ORAL | Status: DC | PRN

## 2019-11-25 MED ORDER — SODIUM CHLORIDE 0.9 % IV SOLN: 250.0000 mL | INTRAVENOUS | Status: DC | PRN

## 2019-11-25 MED ORDER — FUROSEMIDE 10 MG/ML IJ SOLN
60.0000 mg | Freq: Once | INTRAMUSCULAR | Status: AC
  Administered 2019-11-25: 60 mg via INTRAVENOUS
  Filled 2019-11-25: qty 6

## 2019-11-25 MED ORDER — AMLODIPINE BESYLATE 5 MG PO TABS
5.0000 mg | ORAL_TABLET | Freq: Every day | ORAL | Status: DC
  Administered 2019-11-26 – 2019-11-28 (×3): 5 mg via ORAL
  Filled 2019-11-25 (×3): qty 1

## 2019-11-25 NOTE — ED Triage Notes (Signed)
Pt c/o leg swelling and abdominal swelling; pt c/o sob when he lies down; pt has some pitting edema to right lower leg

## 2019-11-25 NOTE — ED Notes (Signed)
Pts daughter reports the pt has not urinated in 3 days

## 2019-11-25 NOTE — ED Provider Notes (Signed)
Emergency Department Provider Note   I have reviewed the triage vital signs and the nursing notes.   HISTORY  Chief Complaint Leg Swelling   HPI [name] is a 84 y.o. male with PMH of CHF (EF 55-60%) and CKD on Lasix 80 mg daily presents to the emergency department for evaluation of lower extremity and abdominal swelling worsening over the past several days.  Patient has been compliant with his Lasix but continues to have worsening lower extremity swelling.  Patient initially tells me that he has had no trouble urinating but his daughter, who cares for him, states that she has not emptied a urinal in the last 3 days.  Patient states he may have urinated a little bit this morning but is not sure.  The patient's daughter, at bedside, states that his abdomen is much more distended and his legs are significantly more swollen in the last several days than normal. The patient has noticed some shortness of breath especially when getting up to move around or when lying flat.  He denies any chest pain. No fever, chills, or cough. No sick contacts. No vomiting or diarrhea.    Past Medical History:  Diagnosis Date  . Anemia   . Arthritis   . Carotid artery disease ([place])   . CHF (congestive heart failure) ([place])   . CKD (chronic kidney disease), stage IV ([place])   . Coronary artery disease 2010   Status post coronary artery bypass grafting  . Diabetes mellitus    Type II  . Dyspnea   . Dysrhythmia   . Foot ulcer ([place])   . History of blood transfusion 05/2018  . Hyperlipidemia   . Hypertension   . Hypothyroidism   . Myocardial infarction ([place]) 2010  . PAD (peripheral artery disease) ([place])   . Postoperative atrial fibrillation ([place])   . Thrombocytopenia [facility])     Patient Active Problem List   Diagnosis Date Noted  . Pressure injury of skin 07/23/2018  . Stage 3 chronic kidney disease   . Coronary artery disease involving coronary bypass graft of native heart without angina  pectoris   . Poorly controlled type 2 diabetes mellitus ([place])   . Congestive heart failure ([place])   . Postoperative pain   . Hyperkalemia   . Acute blood loss anemia   . Below knee amputation status, left 07/16/2018  . Cellulitis of left foot   . Severe protein-calorie malnutrition ([place])   . Uncontrolled type 2 diabetes mellitus with polyneuropathy ([place])   . PAD (peripheral artery disease) ([place])   . Preoperative cardiovascular examination   . S/P CABG (coronary artery bypass graft)   . Atherosclerosis of native arteries of extremities with gangrene, left leg ([place]) 06/07/2018  . Atherosclerosis of native arteries of the extremities with ulceration ([place]) 05/26/2018  . Acute respiratory failure with hypoxia ([place])   . Hypothyroidism 01/08/2018  . Nausea and vomiting 12/23/2017  . DM type 2 causing vascular disease ([place]) 05/07/2016  . Essential hypertension, benign 05/07/2016  . Other specified hypothyroidism 05/07/2016  . Bilateral carotid bruits 03/11/2016  . Elevated troponin   . Acute on chronic diastolic CHF (congestive heart failure) ([place])   . Bacteremia 02/24/2016  . Sepsis ([place]) 02/22/2016  . Uncontrolled type 2 diabetes mellitus with stage 4 chronic kidney disease ([place]) 02/22/2016  . Demand ischemia ([place]) 02/22/2016  . UTI (urinary tract infection) 02/22/2016  . Acute renal failure superimposed on stage 3 chronic kidney disease ([place])   . Pyrexia   .  Arterial hypotension   . Hypoxia   . UTI (lower urinary tract infection)   . OBESITY, UNSPECIFIED 08/24/2009  . Hyperlipidemia 08/15/2009  . Essential hypertension 08/15/2009  . CAD, ARTERY BYPASS GRAFT 08/15/2009  . Atrial fibrillation ([place]) 08/15/2009    Past Surgical History:  Procedure Laterality Date  . AMPUTATION Left 06/16/2018   Procedure: LEFT TRANSMETATARSAL AMPUTATION;  Surgeon: [name], MD;  Location: [place];  Service: Orthopedics;  Laterality: Left;  . AMPUTATION Left 07/16/2018   Procedure: LEFT BELOW KNEE  AMPUTATION;  Surgeon: [name], MD;  Location: [place];  Service: Orthopedics;  Laterality: Left;  . AORTOGRAM Left 06/03/2018   Procedure: ABDOMINAL AORTOGRAM WITH CARBON DIOXIDE LEFT LOWER EXTREMITY RUNOFF;  Surgeon: Conrad Livingston, MD;  Location: [place];  Service: Vascular;  Laterality: Left;  . BELOW KNEE LEG AMPUTATION Left 07/16/2018  . carotid insuff    . CORONARY ARTERY BYPASS GRAFT  2010   LIMA to LAD, SVG to diagonal, SVG to circumflex, marginal, SVG to posterior descending branch.  . FEMORAL-POPLITEAL BYPASS GRAFT Left 06/10/2018   Procedure: LEFT COMMON FEMORAL TO BELOW KNEE POPLITEAL BYPASS GRAFT WITH PROPATEN;  Surgeon: Conrad Bokchito, MD;  Location: [facility];  Service: Vascular;  Laterality: Left;    Allergies Patient has no known allergies.  Family History  Problem Relation Age of Onset  . Heart attack Father   . Heart attack Brother     Social History Social History   Tobacco Use  . Smoking status: Never Smoker  . Smokeless tobacco: Never Used  Substance Use Topics  . Alcohol use: Not Currently    Alcohol/week: 0.0 standard drinks  . Drug use: No    Review of Systems  Constitutional: No fever/chills Eyes: No visual changes. ENT: No sore throat. Cardiovascular: Denies chest pain. Positive edema in the bilateral LEs.  Respiratory: Positive exertional shortness of breath. Gastrointestinal: No abdominal pain.  No nausea, no vomiting.  No diarrhea.  No constipation. Genitourinary: Negative for dysuria. Musculoskeletal: Negative for back pain. Skin: Negative for rash. Neurological: Negative for headaches, focal weakness or numbness.  10-point ROS otherwise negative.  ____________________________________________   PHYSICAL EXAM:  VITAL SIGNS: ED Triage Vitals  Enc Vitals Group     BP 11/25/19 1702 (!) 159/71     Pulse Rate 11/25/19 1702 74     Resp 11/25/19 1702 18     Temp 11/25/19 1702 98.4 F (36.9 C)     Temp Source 11/25/19 1702 Oral     SpO2  11/25/19 1702 90 %     Weight 11/25/19 1702 180 lb (81.6 kg)     Height 11/25/19 1702 5\' 6"  (1.676 m)   Constitutional: Alert and oriented. Well appearing and in no acute distress. Eyes: Conjunctivae are normal.  Head: Atraumatic. Nose: No congestion/rhinnorhea. Mouth/Throat: Mucous membranes are moist.  Neck: No stridor.   Cardiovascular: Normal rate, regular rhythm. Good peripheral circulation. Grossly normal heart sounds.   Respiratory: Normal respiratory effort.  No retractions. Lungs CTAB. Gastrointestinal: Soft and nontender. Moderate distention.  Musculoskeletal: BKA on the left with pitting edema bilaterally (3+) and edema into the lower abdomen.  Neurologic:  Normal speech and language.  Skin:  Skin is warm, dry and intact. No rash noted.   ____________________________________________   LABS (all labs ordered are listed, but only abnormal results are displayed)  Labs Reviewed  BASIC METABOLIC PANEL - Abnormal; Notable for the following components:      Result Value  Glucose, Bld 145 (*)    BUN 43 (*)    Creatinine, Ser 2.52 (*)    Calcium 8.5 (*)    GFR calc non Af Amer 23 (*)    GFR calc Af Amer 26 (*)    All other components within normal limits  CBC WITH DIFFERENTIAL/PLATELET - Abnormal; Notable for the following components:   RBC 3.77 (*)    Hemoglobin 11.6 (*)    HCT 38.1 (*)    MCV 101.1 (*)    Platelets 130 (*)    All other components within normal limits  SARS CORONAVIRUS 2 (TAT 6-24 HRS)  BRAIN NATRIURETIC PEPTIDE   ____________________________________________  EKG   EKG Interpretation  Date/Time:  Friday November 25 2019 18:59:31 EST Ventricular Rate:  76 PR Interval:    QRS Duration: 114 QT Interval:  397 QTC Calculation: 447 R Axis:   71 Text Interpretation: Sinus rhythm Atrial premature complex Prolonged PR interval Inferior infarct, old Consider anterolateral infarct No STEMI Confirmed by Nanda Quinton [phone]) on 11/25/2019 7:38:42 PM         ____________________________________________  RADIOLOGY  DG Chest 2 View  Result Date: 11/25/2019 CLINICAL DATA:  Leg and abdominal swelling, shortness of breath EXAM: CHEST - 2 VIEW COMPARISON:  10/22/2019 FINDINGS: Cardiomegaly status post median sternotomy and CABG. Low volume examination with mild diffuse interstitial opacity. The visualized skeletal structures are unremarkable. IMPRESSION: 1. Low volume examination with mild diffuse interstitial opacity, suggesting mild edema. 2. Cardiomegaly status post CABG. Electronically Signed   By: Eddie Candle M.D.   On: 11/25/2019 18:35    ____________________________________________   PROCEDURES  Procedure(s) performed:   Procedures  None  ____________________________________________   INITIAL IMPRESSION / ASSESSMENT AND PLAN / ED COURSE  Pertinent labs & imaging results that were available during my care of the patient were reviewed by me and considered in my medical decision making (see chart for details).   Patient presents to the emergency department for evaluation of lower leg edema worsening over the past several days with shortness of breath.  Patient with borderline hypoxemia in triage.  He is not particularly short of breath at rest but does become winded with exertion.  Plan for screening labs and chest x-ray. Daughter reporting little to no urine output in the last 3 days.   06:45 PM  Bladder scan pending but patient without lower abdominal pain or distention to suspect acute obstruction process.  Patient's kidney function is at baseline.  Chest x-ray shows mild edema.  Patient's oxygen saturation on my reassessment is in the high 80s to low 90s.  He is unable to ambulate given his BKA but with any movement in the bed or transition over to the bed he becomes very dyspneic.  In the setting of chronic renal insufficiency, anuria, and worsening edema I feel that a trial of outpatient diuresis over the weekend without  follow-up would not be safe.  Will discuss admission with the hospitalist team.   Discussed patient's case with [facility], Dr. Darrick Meigs to request admission. Patient and family (if present) updated with plan. Care transferred to [facility] service.  I reviewed all nursing notes, vitals, pertinent old records, EKGs, labs, imaging (as available).  ____________________________________________  FINAL CLINICAL IMPRESSION(S) / ED DIAGNOSES  Final diagnoses:  Acute pulmonary edema (HCC)  SOB (shortness of breath)    MEDICATIONS GIVEN DURING THIS VISIT:  Medications  furosemide (LASIX) injection 60 mg (60 mg Intravenous Given 11/25/19 1901)    Note:  This  document was prepared using Systems analyst and may include unintentional dictation errors.  Nanda Quinton, MD, [facility] Emergency Medicine    Lahari Suttles, [facility], MD 11/25/19 [phone]

## 2019-11-25 NOTE — H&P (Addendum)
TRH H&P    Patient Demographics:    [name], is a 84 y.o. male  MRN: [id number]  DOB - 04-27-1935  Admit Date - 11/25/2019  Referring MD/NP/PA: Nanda Quinton  Outpatient Primary MD for the patient is Medicine, [facility]  Patient coming from: Home  Chief complaint-worsening leg swelling, shortness of breath   HPI:    [name]  is a 84 y.o. male,9 with history of CAD s/p CABG, peripheral arterial disease s/p femoropopliteal bypass graft in left lower extremity, left BKA, diabetes mellitus type 2 uncontrolled, carotid artery disease, CKD stage IV, hyperlipidemia, hypertension, hypothyroidism, chronic diastolic CHF with a EF 55 to 60% came to hospital with chief complaint of worsening right leg swelling, abdominal distention going on for past 1 week.  Patient also experienced worsening shortness of breath.  Patient takes Lasix 80 mg daily at home, dose of Lasix was changed to 40 mg by his PCP couple of months ago.  Since then patient has noticed a worsening swelling of the right lower extremity. He denies fever or chills. Has been coughing up clear phlegm. Denies abdominal pain. Denies dysuria  In the ED, chest x-ray showed mild pulmonary edema.  Patient was found to have extensive edema of the right lower extremity with abdominal distention.  80 mg of Lasix IV was given in the ED.  Currently patient is requiring 2 L/min of oxygen via nasal cannula.  Usually patient is not on oxygen at home.    Review of systems:    In addition to the HPI above,    All other systems reviewed and are negative.    Past History of the following :    Past Medical History:  Diagnosis Date  . Anemia   . Arthritis   . Carotid artery disease ([place])   . CHF (congestive heart failure) ([place])   . CKD (chronic kidney disease), stage IV ([place])   . Coronary artery disease 2010   Status post coronary artery bypass  grafting  . Diabetes mellitus    Type II  . Dyspnea   . Dysrhythmia   . Foot ulcer ([place])   . History of blood transfusion 05/2018  . Hyperlipidemia   . Hypertension   . Hypothyroidism   . Myocardial infarction ([place]) 2010  . PAD (peripheral artery disease) ([place])   . Postoperative atrial fibrillation ([employer] [facility])   . Thrombocytopenia ([place])       Past Surgical History:  Procedure Laterality Date  . AMPUTATION Left 06/16/2018   Procedure: LEFT TRANSMETATARSAL AMPUTATION;  Surgeon: [name], MD;  Location: [place];  Service: Orthopedics;  Laterality: Left;  . AMPUTATION Left 07/16/2018   Procedure: LEFT BELOW KNEE AMPUTATION;  Surgeon: [name], MD;  Location: [place];  Service: Orthopedics;  Laterality: Left;  . AORTOGRAM Left 06/03/2018   Procedure: ABDOMINAL AORTOGRAM WITH CARBON DIOXIDE LEFT LOWER EXTREMITY RUNOFF;  Surgeon: Conrad Maquoketa, MD;  Location: [place];  Service: Vascular;  Laterality: Left;  . BELOW KNEE LEG AMPUTATION Left 07/16/2018  . carotid insuff    .  CORONARY ARTERY BYPASS GRAFT  2010   LIMA to LAD, SVG to diagonal, SVG to circumflex, marginal, SVG to posterior descending branch.  . FEMORAL-POPLITEAL BYPASS GRAFT Left 06/10/2018   Procedure: LEFT COMMON FEMORAL TO BELOW KNEE POPLITEAL BYPASS GRAFT WITH PROPATEN;  Surgeon: Conrad Sterling, MD;  [facility];  Service: Vascular;  Laterality: Left;      Social History:      Social History   Tobacco Use  . Smoking status: Never Smoker  . Smokeless tobacco: Never Used  Substance Use Topics  . Alcohol use: Not Currently    Alcohol/week: 0.0 standard drinks       Family History :     Family History  Problem Relation Age of Onset  . Heart attack Father   . Heart attack Brother       Home Medications:   Prior to Admission medications   Medication Sig Start Date End Date Taking? Authorizing Provider  acetaminophen (TYLENOL) 325 MG tablet Take 2 tablets (650 mg total) by mouth every 6 (six) hours as  needed for mild pain (or Fever >/= 101). 06/18/18   Ledell Noss, MD  amLODipine (NORVASC) 5 MG tablet Take 5 mg by mouth daily.    [provider]  aspirin 81 MG tablet Take 81 mg by mouth at bedtime.     [provider]  atorvastatin (LIPITOR) 80 MG tablet Take 80 mg by mouth at bedtime.  01/30/16   [provider]  furosemide (LASIX) 40 MG tablet Take 80 mg by mouth daily.    [provider]  insulin glargine (LANTUS) 100 UNIT/ML injection Inject 0.18 mLs (18 Units total) into the skin at bedtime. 07/21/18   [name], MD  insulin NPH-regular Human (HUMULIN 70/30) (70-30) 100 UNIT/ML injection Inject 0-6 Units into the skin 3 (three) times daily.    [provider]  levothyroxine (SYNTHROID, LEVOTHROID) 50 MCG tablet Take 50 mcg by mouth daily before breakfast.     [provider]  nitroGLYCERIN (NITRODUR - DOSED IN MG/24 HR) 0.2 mg/hr patch Place 1 patch (0.2 mg total) onto the skin daily. 06/30/18   Suzan Slick, NP  ondansetron (ZOFRAN) 4 MG tablet Take 1 tablet (4 mg total) by mouth every 6 (six) hours as needed for nausea. 06/18/18   Jean Rosenthal, MD  oxyCODONE (ROXICODONE) 5 MG immediate release tablet Take 1 tablet (5 mg total) by mouth every 4 (four) hours as needed for severe pain. 08/27/18   Hilts, Legrand Como, MD  oxyCODONE-acetaminophen (PERCOCET) 10-325 MG tablet Take 1 tablet by mouth every 6 (six) hours as needed for pain. 09/09/18   Rayburn, Neta Mends, PA-C  potassium chloride (K-DUR,KLOR-CON) 10 MEQ tablet Take 10 mEq by mouth daily. 07/10/18   [provider]  senna (SENOKOT) 8.6 MG TABS tablet Take 1 tablet (8.6 mg total) by mouth daily. 06/18/18   Jean Rosenthal, MD     Allergies:    No Known Allergies   Physical Exam:   Vitals  Blood pressure 129/78, pulse 73, temperature 98.4 F (36.9 C), temperature source Oral, resp. rate 18, height 5\' 6"  (1.676 m), weight 81.6 kg, SpO2 98 %.  1.  General: Appears in no  acute distress  2. Psychiatric: Alert, oriented x3, intact insight and judgment  3. Neurologic: Cranial nerves II through XII grossly intact, moving all extremities, no focal deficit noted  4. HEENMT:  Atraumatic normocephalic, extraocular muscles are intact  5. Respiratory : Bilateral  crackles auscultated  6. Cardiovascular : S1-S2, regular, no murmur auscultated, left BKA, right lower extremity 3+ pitting edema  7. Gastrointestinal:  Abdomen is distended, positive shifting dullness     Data Review:    CBC Recent Labs  Lab 11/25/19 1731  WBC 6.8  HGB 11.6*  HCT 38.1*  PLT 130*  MCV 101.1*  MCH 30.8  MCHC 30.4  RDW 13.2  LYMPHSABS 1.5  MONOABS 0.5  EOSABS 0.4  BASOSABS 0.0   ------------------------------------------------------------------------------------------------------------------  Results for orders placed or performed during the hospital encounter of 11/25/19 (from the past 48 hour(s))  Basic metabolic panel     Status: Abnormal   Collection Time: 11/25/19  5:31 PM  Result Value Ref Range   Sodium 139 135 - 145 mmol/L   Potassium 4.3 3.5 - 5.1 mmol/L   Chloride 106 98 - 111 mmol/L   CO2 24 22 - 32 mmol/L   Glucose, Bld 145 (H) 70 - 99 mg/dL   BUN 43 (H) 8 - 23 mg/dL   Creatinine, Ser 2.52 (H) 0.61 - 1.24 mg/dL   Calcium 8.5 (L) 8.9 - 10.3 mg/dL   GFR calc non Af Amer 23 (L) >60 mL/min   GFR calc Af Amer 26 (L) >60 mL/min   Anion gap 9 5 - 15    Comment: Performed at [facility], [address], [place], [place] [address]  CBC with Differential     Status: Abnormal   Collection Time: 11/25/19  5:31 PM  Result Value Ref Range   WBC 6.8 4.0 - 10.5 K/uL   RBC 3.77 (L) 4.22 - 5.81 MIL/uL   Hemoglobin 11.6 (L) 13.0 - 17.0 g/dL   HCT 38.1 (L) 39.0 - 52.0 %   MCV 101.1 (H) 80.0 - 100.0 fL   MCH 30.8 26.0 - 34.0 pg   MCHC 30.4 30.0 - 36.0 g/dL   RDW 13.2 11.5 - 15.5 %   Platelets 130 (L) 150 - 400 K/uL   nRBC 0.0 0.0 - 0.2 %   Neutrophils  Relative % 64 %   Neutro Abs 4.3 1.7 - 7.7 K/uL   Lymphocytes Relative 22 %   Lymphs Abs 1.5 0.7 - 4.0 K/uL   Monocytes Relative 8 %   Monocytes Absolute 0.5 0.1 - 1.0 K/uL   Eosinophils Relative 6 %   Eosinophils Absolute 0.4 0.0 - 0.5 K/uL   Basophils Relative 0 %   Basophils Absolute 0.0 0.0 - 0.1 K/uL   Immature Granulocytes 0 %   Abs Immature Granulocytes 0.02 0.00 - 0.07 K/uL    Comment: Performed at [facility], [address] [place], [place] [address]  Brain natriuretic peptide     Status: None   Collection Time: 11/25/19  5:32 PM  Result Value Ref Range   B Natriuretic Peptide 37.0 0.0 - 100.0 pg/mL    Comment: Performed at [facility], [address] [place], [place] [address]    Chemistries  Recent Labs  Lab 11/25/19 1731  NA 139  K 4.3  CL 106  CO2 24  GLUCOSE 145*  BUN 43*  CREATININE 2.52*  CALCIUM 8.5*    Urine analysis:    Component Value Date/Time   COLORURINE YELLOW 06/07/2018 1814   APPEARANCEUR CLOUDY (A) 06/07/2018 1814   LABSPEC 1.013 06/07/2018 1814   PHURINE 5.0 06/07/2018 1814   GLUCOSEU 150 (A) 06/07/2018 1814   HGBUR SMALL (A) 06/07/2018 1814   BILIRUBINUR NEGATIVE 06/07/2018 1814   KETONESUR NEGATIVE 06/07/2018 1814  PROTEINUR 100 (A) 06/07/2018 1814   UROBILINOGEN 1.0 05/12/2009 1354   NITRITE NEGATIVE 06/07/2018 1814   LEUKOCYTESUR LARGE (A) 06/07/2018 1814      Imaging Results:    DG Chest 2 View  Result Date: 11/25/2019 CLINICAL DATA:  Leg and abdominal swelling, shortness of breath EXAM: CHEST - 2 VIEW COMPARISON:  10/22/2019 FINDINGS: Cardiomegaly status post median sternotomy and CABG. Low volume examination with mild diffuse interstitial opacity. The visualized skeletal structures are unremarkable. IMPRESSION: 1. Low volume examination with mild diffuse interstitial opacity, suggesting mild edema. 2. Cardiomegaly status post CABG. Electronically Signed   By: Eddie Candle M.D.   On: 11/25/2019 18:35    My personal  review of EKG: Rhythm NSR, nonspecific ST changes   Assessment & Plan:    Active Problems:   Acute exacerbation of CHF (congestive heart failure) ([place])   1. Acute exacerbation of diastolic CHF-patient has grade 2 diastolic dysfunction with EF 55 to 60% seen on echo in August 2019.  Today presenting with worsening shortness of breath and lower extremity edema.  BNP 37.  Lasix 80 mg IV given in the ED.  Will monitor strict intake and output, daily weights.  Check renal function in a.m. we will start Lasix 40 mg IV every 12 hours from tomorrow morning.  Currently requiring 2 L/min of oxygen by nasal cannula.  The exacerbation is likely from current on home dose of Lasix by PCP 2 months ago.  We will have to adjust the dose of Lasix at the time of discharge.  2. CKD stage IV-patient creatinine is 2.52, at baseline.  Started on Lasix as above.  Closely monitor patient's BUN/creatinine in a.m.  3. Diabetes mellitus type 2-uncontrolled, last hemoglobin A1c was 8.3 in 2019.  Will repeat hemoglobin A1c, continue with Lantus 18 units subcu daily, will start sliding scale insulin with NovoLog.  Start 3 units NovoLog 3 times daily with meals for meal coverage.  4. Hypothyroidism-continue Synthroid  5. CAD s/p CABG-patient takes Nitro-Dur 0.2 mg patch daily, aspirin, Lipitor.  We will continue all these medications.  No current chest pain.  EKG is unremarkable.  6. Peripheral arterial disease-s/p left BKA, continue aspirin, Lipitor.     DVT Prophylaxis-   Heparin  AM Labs Ordered, also please review Full Orders  Family Communication: Admission, patients condition and plan of care including tests being ordered have been discussed with the patient and his granddaughter at bedside who indicate understanding and agree with the plan and Code Status.  Code Status: DNR  Admission status: Inpatient: Based on patients clinical presentation and evaluation of above clinical data, I have made determination  that patient meets Inpatient criteria at this time.  Time spent in minutes : 60 minutes   Reka Wist S Fantasha Daniele M.D

## 2019-11-26 ENCOUNTER — Encounter (HOSPITAL_COMMUNITY): Payer: Self-pay | Admitting: Family Medicine

## 2019-11-26 DIAGNOSIS — E1122 Type 2 diabetes mellitus with diabetic chronic kidney disease: Secondary | ICD-10-CM

## 2019-11-26 DIAGNOSIS — E039 Hypothyroidism, unspecified: Secondary | ICD-10-CM

## 2019-11-26 DIAGNOSIS — I10 Essential (primary) hypertension: Secondary | ICD-10-CM

## 2019-11-26 DIAGNOSIS — Z951 Presence of aortocoronary bypass graft: Secondary | ICD-10-CM

## 2019-11-26 DIAGNOSIS — E43 Unspecified severe protein-calorie malnutrition: Secondary | ICD-10-CM

## 2019-11-26 DIAGNOSIS — J810 Acute pulmonary edema: Secondary | ICD-10-CM

## 2019-11-26 DIAGNOSIS — E1165 Type 2 diabetes mellitus with hyperglycemia: Secondary | ICD-10-CM

## 2019-11-26 DIAGNOSIS — R0902 Hypoxemia: Secondary | ICD-10-CM

## 2019-11-26 DIAGNOSIS — R0602 Shortness of breath: Secondary | ICD-10-CM

## 2019-11-26 DIAGNOSIS — E1159 Type 2 diabetes mellitus with other circulatory complications: Secondary | ICD-10-CM

## 2019-11-26 DIAGNOSIS — E785 Hyperlipidemia, unspecified: Secondary | ICD-10-CM

## 2019-11-26 DIAGNOSIS — I480 Paroxysmal atrial fibrillation: Secondary | ICD-10-CM

## 2019-11-26 LAB — COMPREHENSIVE METABOLIC PANEL
ALT: 18 U/L (ref 0–44)
AST: 16 U/L (ref 15–41)
Albumin: 3 g/dL — ABNORMAL LOW (ref 3.5–5.0)
Alkaline Phosphatase: 74 U/L (ref 38–126)
Anion gap: 11 (ref 5–15)
BUN: 45 mg/dL — ABNORMAL HIGH (ref 8–23)
CO2: 24 mmol/L (ref 22–32)
Calcium: 8.5 mg/dL — ABNORMAL LOW (ref 8.9–10.3)
Chloride: 107 mmol/L (ref 98–111)
Creatinine, Ser: 2.61 mg/dL — ABNORMAL HIGH (ref 0.61–1.24)
GFR calc Af Amer: 25 mL/min — ABNORMAL LOW (ref >60)
GFR calc non Af Amer: 22 mL/min — ABNORMAL LOW (ref >60)
Glucose, Bld: 94 mg/dL (ref 70–99)
Potassium: 3.6 mmol/L (ref 3.5–5.1)
Sodium: 142 mmol/L (ref 135–145)
Total Bilirubin: 0.4 mg/dL (ref 0.3–1.2)
Total Protein: 6.2 g/dL — ABNORMAL LOW (ref 6.5–8.1)

## 2019-11-26 LAB — GLUCOSE, CAPILLARY
Glucose-Capillary: 77 mg/dL (ref 70–99)
Glucose-Capillary: 106 mg/dL — ABNORMAL HIGH (ref 70–99)
Glucose-Capillary: 134 mg/dL — ABNORMAL HIGH (ref 70–99)
Glucose-Capillary: 118 mg/dL — ABNORMAL HIGH (ref 70–99)

## 2019-11-26 LAB — CBC
HCT: 33.5 % — ABNORMAL LOW (ref 39.0–52.0)
Hemoglobin: 10.2 g/dL — ABNORMAL LOW (ref 13.0–17.0)
MCH: 30.5 pg (ref 26.0–34.0)
MCHC: 30.4 g/dL (ref 30.0–36.0)
MCV: 100.3 fL — ABNORMAL HIGH (ref 80.0–100.0)
Platelets: 125 K/uL — ABNORMAL LOW (ref 150–400)
RBC: 3.34 MIL/uL — ABNORMAL LOW (ref 4.22–5.81)
RDW: 13.1 % (ref 11.5–15.5)
WBC: 5.4 K/uL (ref 4.0–10.5)
nRBC: 0 % (ref 0.0–0.2)

## 2019-11-26 LAB — HEMOGLOBIN A1C
Hgb A1c MFr Bld: 10.2 % — ABNORMAL HIGH (ref 4.8–5.6)
Mean Plasma Glucose: 246.04 mg/dL

## 2019-11-26 LAB — SARS CORONAVIRUS 2 (TAT 6-24 HRS): SARS Coronavirus 2: NEGATIVE

## 2019-11-26 NOTE — Progress Notes (Signed)
PROGRESS NOTE [facility]  AKHIR COWIN  [id number]  DOB: [birth year]  DOA: 11/25/2019 PCP: Medicine, [facility]   Brief Admission Hx: 84 y/o male with CAD s/p CABG, PAD, type 2 DM with vascular complications, stage 4 CKD, HTN, hypothyroidism, chronic diastolic CHF presented with progressive dyspnea and pulmonary edema.   MDM/Assessment & Plan:   1. Acute diastolic CHF exacerbation - Pt has had progressive dyspnea since his lasix dose was reduced outpatient.  He is volume overloaded with pulmonary edema.  He is being diuresed with IV lasix.  Follow weights, I/Os, lytes. Continue supplemental oxygen. EF 55-60% on echo 8/19.  2. Stage 4 CKD - follow creatinine closely with diuresis.  3. Type 2 diabetes mellitus - poorly controlled with vascular complications - resumed home glargine, SSI CBG testing and prandial coverage ordered. Follow.  4. Hypothyroidism- resume home levothyroxine.  5. CAD s/p CABG - stable, no active symptoms, resumed nitrodur patch daily, aspirin, atorvastatin.   6. PAD - he is s/p left BKA, resumed home atorvastatin and aspirin   DVT prophylaxis: heparin  Code Status: DNR  Family Communication: patient updated bedside, verbalized understanding  Disposition Plan: inpatient   Consultants:    Procedures:    Antimicrobials:     Subjective: Pt reports he urinated much of the night but it has slowed down this morning.  His SOB is improving slowly with diuresis.   Objective: Vitals:   11/25/19 2245 11/25/19 2313 11/26/19 0523 11/26/19 0750  BP:  139/73 (!) 142/69 122/64  Pulse:  68 61   Resp: 15 17 17 18   Temp:  98.4 F (36.9 C) 97.8 F (36.6 C) 98.1 F (36.7 C)  TempSrc:  Oral  Oral  SpO2:  97% 98% 98%  Weight:      Height:        Intake/Output Summary (Last 24 hours) at 11/26/2019 1234 Last data filed at 11/25/2019 2300 Gross per 24 hour  Intake --  Output 600 ml  Net -600 ml   Filed Weights   11/25/19 1702  Weight: 81.6  kg   REVIEW OF SYSTEMS  As per history otherwise all reviewed and reported negative  Exam:  General exam: elderly frail chronically ill appearing male, with nasal cannula.  Respiratory system: bibasilar crackles. No increased work of breathing. Cardiovascular system: S1 & S2 heard. No JVD, murmurs, gallops, clicks or pedal edema. Gastrointestinal system: Abdomen is nondistended, soft and nontender. Normal bowel sounds heard. Central nervous system: Alert and oriented. No focal neurological deficits. Extremities: 1+ pitting pretibial edema BLEs.  Data Reviewed: Basic Metabolic Panel: Recent Labs  Lab 11/25/19 1731 11/26/19 0544  NA 139 142  K 4.3 3.6  CL 106 107  CO2 24 24  GLUCOSE 145* 94  BUN 43* 45*  CREATININE 2.52* 2.61*  CALCIUM 8.5* 8.5*   Liver Function Tests: Recent Labs  Lab 11/26/19 0544  AST 16  ALT 18  ALKPHOS 74  BILITOT 0.4  PROT 6.2*  ALBUMIN 3.0*   No results for input(s): LIPASE, AMYLASE in the last 168 hours. No results for input(s): AMMONIA in the last 168 hours. CBC: Recent Labs  Lab 11/25/19 1731 11/26/19 0544  WBC 6.8 5.4  NEUTROABS 4.3  --   HGB 11.6* 10.2*  HCT 38.1* 33.5*  MCV 101.1* 100.3*  PLT 130* 125*   Cardiac Enzymes: No results for input(s): CKTOTAL, CKMB, CKMBINDEX, TROPONINI in the last 168 hours. CBG (last 3)  Recent Labs    11/26/19 0820  11/26/19 1137  GLUCAP 77 106*   No results found for this or any previous visit (from the past 240 hour(s)).   Studies: DG Chest 2 View  Result Date: 11/25/2019 CLINICAL DATA:  Leg and abdominal swelling, shortness of breath EXAM: CHEST - 2 VIEW COMPARISON:  10/22/2019 FINDINGS: Cardiomegaly status post median sternotomy and CABG. Low volume examination with mild diffuse interstitial opacity. The visualized skeletal structures are unremarkable. IMPRESSION: 1. Low volume examination with mild diffuse interstitial opacity, suggesting mild edema. 2. Cardiomegaly status post CABG.  Electronically Signed   By: Eddie Candle M.D.   On: 11/25/2019 18:35   Scheduled Meds: . amLODipine  5 mg Oral Daily  . aspirin EC  81 mg Oral QHS  . atorvastatin  80 mg Oral QHS  . furosemide  40 mg Intravenous Q12H  . heparin  5,000 Units Subcutaneous Q8H  . insulin aspart  0-9 Units Subcutaneous TID WC  . insulin aspart  3 Units Subcutaneous TID WC  . insulin glargine  18 Units Subcutaneous QHS  . levothyroxine  50 mcg Oral QAC breakfast  . senna  1 tablet Oral Daily  . sodium chloride flush  3 mL Intravenous Q12H   Continuous Infusions: . sodium chloride      Active Problems:   Hyperlipidemia   Essential hypertension   CAD, ARTERY BYPASS GRAFT   Atrial fibrillation (HCC)   Uncontrolled type 2 diabetes mellitus with stage 4 chronic kidney disease (HCC)   Hypoxia   DM type 2 causing vascular disease (HCC)   Hypothyroidism   S/P CABG (coronary artery bypass graft)   Severe protein-calorie malnutrition (HCC)   Acute exacerbation of CHF (congestive heart failure) (HCC)   Acute pulmonary edema (HCC)   SOB (shortness of breath)  Time spent:   Irwin Brakeman, MD [facility] 11/26/2019, 12:34 PM    LOS: 1 day  How to contact the [facility] Attending or Consulting provider [place] or covering provider during after hours [place], for this patient?  1. Check the care team in [facility] and look for a) attending/consulting TR[facility] provider listed and b) the [facility] team listed 2. Log into [email or web address] and use [facility] universal password to access. If you do not have the password, please contact the hospital operator. 3. Locate the [facility] provider you are looking for under [facility] and page to a number that you can be directly reached. 4. If you still have difficulty reaching the provider, please page the [facility] (Director on Call) for the Hospitalists listed on amion for assistance.

## 2019-11-27 ENCOUNTER — Inpatient Hospital Stay (HOSPITAL_COMMUNITY): Payer: Medicare HMO

## 2019-11-27 DIAGNOSIS — Z66 Do not resuscitate: Secondary | ICD-10-CM | POA: Diagnosis present

## 2019-11-27 LAB — CBC
HCT: 31.8 % — ABNORMAL LOW (ref 39.0–52.0)
Hemoglobin: 9.7 g/dL — ABNORMAL LOW (ref 13.0–17.0)
MCH: 30.5 pg (ref 26.0–34.0)
MCHC: 30.5 g/dL (ref 30.0–36.0)
MCV: 100 fL (ref 80.0–100.0)
Platelets: 123 10*3/uL — ABNORMAL LOW (ref 150–400)
RBC: 3.18 MIL/uL — ABNORMAL LOW (ref 4.22–5.81)
RDW: 13 % (ref 11.5–15.5)
WBC: 5.7 10*3/uL (ref 4.0–10.5)
nRBC: 0 % (ref 0.0–0.2)

## 2019-11-27 LAB — BASIC METABOLIC PANEL
Anion gap: 11 (ref 5–15)
BUN: 55 mg/dL — ABNORMAL HIGH (ref 8–23)
CO2: 25 mmol/L (ref 22–32)
Calcium: 8.1 mg/dL — ABNORMAL LOW (ref 8.9–10.3)
Chloride: 105 mmol/L (ref 98–111)
Creatinine, Ser: 3.01 mg/dL — ABNORMAL HIGH (ref 0.61–1.24)
GFR calc Af Amer: 21 mL/min — ABNORMAL LOW (ref >60)
GFR calc non Af Amer: 18 mL/min — ABNORMAL LOW (ref >60)
Glucose, Bld: 60 mg/dL — ABNORMAL LOW (ref 70–99)
Potassium: 3.3 mmol/L — ABNORMAL LOW (ref 3.5–5.1)
Sodium: 141 mmol/L (ref 135–145)

## 2019-11-27 LAB — GLUCOSE, CAPILLARY
Glucose-Capillary: 60 mg/dL — ABNORMAL LOW (ref 70–99)
Glucose-Capillary: 183 mg/dL — ABNORMAL HIGH (ref 70–99)
Glucose-Capillary: 132 mg/dL — ABNORMAL HIGH (ref 70–99)
Glucose-Capillary: 154 mg/dL — ABNORMAL HIGH (ref 70–99)

## 2019-11-27 LAB — MAGNESIUM: Magnesium: 1.9 mg/dL (ref 1.7–2.4)

## 2019-11-27 MED ORDER — INSULIN GLARGINE 100 UNIT/ML ~~LOC~~ SOLN
10.0000 [IU] | Freq: Every day | SUBCUTANEOUS | Status: DC
  Administered 2019-11-27: 10 [IU] via SUBCUTANEOUS
  Filled 2019-11-27 (×2): qty 0.1

## 2019-11-27 MED ORDER — PANTOPRAZOLE SODIUM 40 MG PO TBEC
40.0000 mg | DELAYED_RELEASE_TABLET | Freq: Every day | ORAL | Status: DC
  Administered 2019-11-27 – 2019-11-28 (×2): 40 mg via ORAL
  Filled 2019-11-27 (×2): qty 1

## 2019-11-27 MED ORDER — INSULIN ASPART 100 UNIT/ML ~~LOC~~ SOLN
2.0000 [IU] | Freq: Three times a day (TID) | SUBCUTANEOUS | Status: DC
  Administered 2019-11-27 – 2019-11-28 (×3): 2 [IU] via SUBCUTANEOUS

## 2019-11-27 MED ORDER — INSULIN GLARGINE 100 UNIT/ML ~~LOC~~ SOLN: 12.0000 [IU] | Freq: Every day | SUBCUTANEOUS | Status: DC

## 2019-11-27 NOTE — Progress Notes (Signed)
PROGRESS NOTE [name]  [name]  [id number]  DOB: 1935/10/24  DOA: 11/25/2019 PCP: Medicine, [facility]   Brief Admission Hx: 84 y/o male with CAD s/p CABG, PAD, type 2 DM with vascular complications, stage 4 CKD, HTN, hypothyroidism, chronic diastolic CHF presented with progressive dyspnea and pulmonary edema.   MDM/Assessment & Plan:   1. Acute diastolic CHF exacerbation - Pt presented progressive dyspnea since his lasix dose was reduced outpatient.  He was volume overloaded with pulmonary edema.  He was diuresed with IV lasix and he is negative 2.5L and feeling better.  His creatinine bumped and will stop lasix and give diuretic holiday today.  Continue to follow weights, I/Os, lytes. Continue supplemental oxygen. EF 55-60% on echo 8/19.   He likely will be able to discharge home tomorrow.  Awaiting follow up chest xray and PT evaluation to be completed.   2. Stage 4 CKD - creatinine bumped today, hold lasix and give diuretic holiday today.  Plan to have him follow up with his outpatient nephrologist after discharge.   3. Type 2 diabetes mellitus - poorly controlled with vascular complications - He has had some hypoglycemia this morning.  I have reduced his glargine and prandial novolog coverage. Monitor CBG 5 times per day and encouraged eating and drinking which he is doing well.  4. Hypothyroidism- resumed home levothyroxine.  5. CAD s/p CABG - stable, no active symptoms, resumed nitrodur patch daily, aspirin, atorvastatin.   6. PAD - he is s/p left BKA, resumed home atorvastatin and aspirin.    DVT prophylaxis: heparin  Code Status: DNR  Family Communication: patient updated bedside, verbalized understanding  Disposition Plan: inpatient   Consultants:    Procedures:    Antimicrobials:     Subjective: Pt reports that he is feeling much better. He is not having any chest pain or SOB.  He has not seen PT yet but will participate with them. He is  eating and drinking well.    Objective: Vitals:   11/26/19 2044 11/26/19 2114 11/27/19 0506 11/27/19 0912  BP: (!) 117/54  (!) 112/58 (!) 124/54  Pulse: 62  68   Resp: 17  14   Temp: 98.6 F (37 C)  (!) 97.5 F (36.4 C)   TempSrc:   Oral   SpO2: 98% 98% 98%   Weight:      Height:        Intake/Output Summary (Last 24 hours) at 11/27/2019 1131 Last data filed at 11/27/2019 1000 Gross per 24 hour  Intake 3 ml  Output 1525 ml  Net -1522 ml   Filed Weights   11/25/19 1702  Weight: 81.6 kg   REVIEW OF SYSTEMS  As per history otherwise all reviewed and reported negative  Exam:  General exam: elderly frail chronically ill appearing male, with nasal cannula.  Respiratory system: improved from prior exam, only very rare crackles at bases now. No increased work of breathing. Cardiovascular system: S1 & S2 heard. No JVD, murmurs, gallops, clicks or pedal edema. Gastrointestinal system: Abdomen is nondistended, soft and nontender. Normal bowel sounds heard. Central nervous system: Alert and oriented. No focal neurological deficits. Extremities: trace pretibial edema BLEs.  Data Reviewed: Basic Metabolic Panel: Recent Labs  Lab 11/25/19 1731 11/26/19 0544 11/27/19 0633  NA 139 142 141  K 4.3 3.6 3.3*  CL 106 107 105  CO2 24 24 25   GLUCOSE 145* 94 60*  BUN 43* 45* 55*  CREATININE 2.52* 2.61* 3.01*  CALCIUM 8.5* 8.5* 8.1*  MG  --   --  1.9   Liver Function Tests: Recent Labs  Lab 11/26/19 0544  AST 16  ALT 18  ALKPHOS 74  BILITOT 0.4  PROT 6.2*  ALBUMIN 3.0*   No results for input(s): LIPASE, AMYLASE in the last 168 hours. No results for input(s): AMMONIA in the last 168 hours. CBC: Recent Labs  Lab 11/25/19 1731 11/26/19 0544 11/27/19 0633  WBC 6.8 5.4 5.7  NEUTROABS 4.3  --   --   HGB 11.6* 10.2* 9.7*  HCT 38.1* 33.5* 31.8*  MCV 101.1* 100.3* 100.0  PLT 130* 125* 123*   Cardiac Enzymes: No results for input(s): CKTOTAL, CKMB, CKMBINDEX,  TROPONINI in the last 168 hours. CBG (last 3)  Recent Labs    11/26/19 1657 11/26/19 2028 11/27/19 0729  GLUCAP 134* 118* 60*   Recent Results (from the past 240 hour(s))  SARS CORONAVIRUS 2 (TAT 6-24 HRS) Nasopharyngeal Nasopharyngeal Swab     Status: None   Collection Time: 11/25/19  7:15 PM   Specimen: Nasopharyngeal Swab  Result Value Ref Range Status   SARS Coronavirus 2 NEGATIVE NEGATIVE Final    Comment: (NOTE) SARS-CoV-2 target nucleic acids are NOT DETECTED. The SARS-CoV-2 RNA is generally detectable in upper and lower respiratory specimens during the acute phase of infection. Negative results do not preclude SARS-CoV-2 infection, do not rule out co-infections with other pathogens, and should not be used as the sole basis for treatment or other patient management decisions. Negative results must be combined with clinical observations, patient history, and epidemiological information. The expected result is Negative. Fact Sheet for Patients: SugarRoll.be Fact Sheet for Healthcare Providers: https://www.woods-mathews.com/ This test is not yet approved or cleared by the [place] FDA and  has been authorized for detection and/or diagnosis of SARS-CoV-2 by FDA under an Emergency Use Authorization (EUA). This EUA will remain  in effect (meaning this test can be used) for the duration of the COVID-19 declaration under Section 56 4(b)(1) of the Act, 21 U.S.C. section 360bbb-3(b)(1), unless the authorization is terminated or revoked sooner. Performed at [facility], [place] [address], [place], [place] [address]     Studies: DG Chest 2 View  Result Date: 11/25/2019 CLINICAL DATA:  Leg and abdominal swelling, shortness of breath EXAM: CHEST - 2 VIEW COMPARISON:  10/22/2019 FINDINGS: Cardiomegaly status post median sternotomy and CABG. Low volume examination with mild diffuse interstitial opacity. The visualized skeletal  structures are unremarkable. IMPRESSION: 1. Low volume examination with mild diffuse interstitial opacity, suggesting mild edema. 2. Cardiomegaly status post CABG. Electronically Signed   By: Eddie Candle M.D.   On: 11/25/2019 18:35   Scheduled Meds: . amLODipine  5 mg Oral Daily  . aspirin EC  81 mg Oral QHS  . atorvastatin  80 mg Oral QHS  . heparin  5,000 Units Subcutaneous Q8H  . insulin aspart  0-9 Units Subcutaneous TID WC  . insulin aspart  2 Units Subcutaneous TID WC  . insulin glargine  10 Units Subcutaneous QHS  . levothyroxine  50 mcg Oral QAC breakfast  . senna  1 tablet Oral Daily  . sodium chloride flush  3 mL Intravenous Q12H   Continuous Infusions: . sodium chloride      Active Problems:   Hyperlipidemia   Essential hypertension   CAD, ARTERY BYPASS GRAFT   Atrial fibrillation (HCC)   Uncontrolled type 2 diabetes mellitus with stage 4 chronic kidney disease ([place])  Hypoxia   DM type 2 causing vascular disease (HCC)   Hypothyroidism   S/P CABG (coronary artery bypass graft)   Severe protein-calorie malnutrition (HCC)   Acute exacerbation of CHF (congestive heart failure) (HCC)   Acute pulmonary edema (HCC)   SOB (shortness of breath)   DNR (do not resuscitate)  Time spent:   Irwin Brakeman, MD [facility] 11/27/2019, 11:31 AM    LOS: 2 days  How to contact the [facility] Attending or Consulting provider [place] or covering provider during after hours [place], for this patient?  1. Check the care team in [facility] and look for a) attending/consulting TRH provider listed and b) the [facility] team listed 2. Log into [email or web address] and use [place]'s universal password to access. If you do not have the password, please contact the hospital operator. 3. Locate the [facility] provider you are looking for under [facility] and page to a number that you can be directly reached. 4. If you still have difficulty reaching the provider, please page the [facility] (Director on Call) for  the Hospitalists listed on amion for assistance.

## 2019-11-28 LAB — GLUCOSE, CAPILLARY
Glucose-Capillary: 129 mg/dL — ABNORMAL HIGH (ref 70–99)
Glucose-Capillary: 138 mg/dL — ABNORMAL HIGH (ref 70–99)
Glucose-Capillary: 186 mg/dL — ABNORMAL HIGH (ref 70–99)

## 2019-11-28 MED ORDER — TRAZODONE HCL 50 MG PO TABS: 50.0000 mg | ORAL_TABLET | Freq: Every evening | ORAL | 0 refills | Status: DC | PRN

## 2019-11-28 MED ORDER — INSULIN GLARGINE 100 UNIT/ML ~~LOC~~ SOLN: 10.0000 [IU] | Freq: Every day | SUBCUTANEOUS | 11 refills | Status: AC

## 2019-11-28 NOTE — Evaluation (Signed)
Physical Therapy Evaluation Patient Details Name: [name] MRN: [id number] DOB: 1935/06/18 Today's Date: 11/28/2019   History of Present Illness  [name]  is a 84 y.o. male,9 with history of CAD s/p CABG, peripheral arterial disease s/p femoropopliteal bypass graft in left lower extremity, left BKA, diabetes mellitus type 2 uncontrolled, carotid artery disease, CKD stage IV, hyperlipidemia, hypertension, hypothyroidism, chronic diastolic CHF with a EF 55 to 60% came to hospital with chief complaint of worsening right leg swelling, abdominal distention going on for past 1 week.  Patient also experienced worsening shortness of breath.  Patient takes Lasix 80 mg daily at home, dose of Lasix was changed to 40 mg by his PCP couple of months ago.  Since then patient has noticed a worsening swelling of the right lower extremity.    Clinical Impression  Patient functioning near baseline for functional mobility and gait, demonstrates slow labored movement for sitting up at bedside using bed rail, good return for stand pivot transfer to chair using armrest and states he wants to eventually walk again using his LLE BKA prosthetic leg at home.  Plan patient to be discharged home today and discharged to care of nursing for OOB daily as tolerated.     Follow Up Recommendations Home health PT    Equipment Recommendations  None recommended by PT    Recommendations for Other Services       Precautions / Restrictions Precautions Precautions: Fall Restrictions Weight Bearing Restrictions: No      Mobility  Bed Mobility Overal bed mobility: Modified Independent             General bed mobility comments: increased time, required use of bed rail  Transfers Overall transfer level: Needs assistance Equipment used: None(used armrest of chair) Transfers: Sit to/from Stand;Stand Pivot Transfers Sit to Stand: Supervision Stand pivot transfers: Supervision       General transfer  comment: demonstrates good return for partial stand pivot to transfer to chair  Ambulation/Gait                Stairs            Wheelchair Mobility    Modified Rankin (Stroke Patients Only)       Balance Overall balance assessment: Needs assistance Sitting-balance support: Feet supported;No upper extremity supported Sitting balance-Leahy Scale: Good Sitting balance - Comments: seated at EOB   Standing balance support: During functional activity;Bilateral upper extremity supported Standing balance-Leahy Scale: Poor Standing balance comment: fair/poor using armrest of chair                             Pertinent Vitals/Pain Pain Assessment: No/denies pain    Home Living Family/patient expects to be discharged to:: Private residence   Available Help at Discharge: Family;Available 24 hours/day Type of Home: Mobile home Home Access: Ramped entrance     Home Layout: One level Home Equipment: [place] - 2 wheels;Wheelchair - manual;Shower seat      Prior Function Level of Independence: Needs assistance   Gait / Transfers Assistance Needed: Mod Independent stand pivot transfers, uses wheelchair for mobility, non-ambulatory since having a fall months ago, has LLE BKA prosthetic leg  ADL's / Homemaking Assistance Needed: assisted by family        Hand Dominance   Dominant Hand: Right    Extremity/Trunk Assessment   Upper Extremity Assessment Upper Extremity Assessment: Overall WFL for tasks assessed    Lower Extremity Assessment  Lower Extremity Assessment: Generalized weakness    Cervical / Trunk Assessment Cervical / Trunk Assessment: Normal  Communication   Communication: No difficulties  Cognition Arousal/Alertness: Awake/alert Behavior During Therapy: WFL for tasks assessed/performed Overall Cognitive Status: Within Functional Limits for tasks assessed                                        General Comments       Exercises     Assessment/Plan    PT Assessment All further PT needs can be met in the next venue of care  PT Problem List Decreased strength;Decreased activity tolerance;Decreased balance;Decreased mobility       PT Treatment Interventions      PT Goals (Current goals can be found in the Care Plan section)  Acute Rehab PT Goals Patient Stated Goal: follow with physical therapy in order to walk using his LLE BKA prosthetic leg PT Goal Formulation: With patient Time For Goal Achievement: 11/28/19 Potential to Achieve Goals: Fair    Frequency     Barriers to discharge        Co-evaluation               AM-PAC PT "6 Clicks" Mobility  Outcome Measure Help needed turning from your back to your side while in a flat bed without using bedrails?: None Help needed moving from lying on your back to sitting on the side of a flat bed without using bedrails?: A Little Help needed moving to and from a bed to a chair (including a wheelchair)?: A Little Help needed standing up from a chair using your arms (e.g., wheelchair or bedside chair)?: A Little Help needed to walk in hospital room?: Total Help needed climbing 3-5 steps with a railing? : Total 6 Click Score: 15    End of Session   Activity Tolerance: Patient tolerated treatment well;Patient limited by fatigue Patient left: in chair;with call bell/phone within reach Nurse Communication: Mobility status PT Visit Diagnosis: Unsteadiness on feet (R26.81);Other abnormalities of gait and mobility (R26.89);Muscle weakness (generalized) (M62.81)    Time: 9122-5834 PT Time Calculation (min) (ACUTE ONLY): 21 min   Charges:   PT Evaluation $PT Eval Moderate Complexity: 1 Mod PT Treatments $Therapeutic Activity: 8-22 mins        1:45 PM, 11/28/19 Lonell Grandchild, MPT Physical Therapist with [facility] [phone] office [phone] mobile phone

## 2019-11-28 NOTE — Discharge Summary (Signed)
Physician Discharge Summary  [name] [id number] DOB: 08-09-1935 DOA: 11/25/2019  PCP: No primary care provider on file.  Admit date: 11/25/2019 Discharge date: 11/28/2019  Admitted From:  Home  Disposition:  Home   Recommendations for Outpatient Follow-up:  1. Follow up with PCP in 1 weeks 2. Follow up with cardiology as scheduled 3. Establish care with nephrology as recommended 4. Please obtain BMP/CBC in 1-2 weeks  Discharge Condition: STABLE   CODE STATUS: DNR    Brief Hospitalization Summary: Please see all hospital notes, images, labs for full details of the hospitalization. HPI Dr. Darrick Meigs:  [name]  is a 84 y.o. male,9 with history of CAD s/p CABG, peripheral arterial disease s/p femoropopliteal bypass graft in left lower extremity, left BKA, diabetes mellitus type 2 uncontrolled, carotid artery disease, CKD stage IV, hyperlipidemia, hypertension, hypothyroidism, chronic diastolic CHF with a EF 55 to 60% came to hospital with chief complaint of worsening right leg swelling, abdominal distention going on for past 1 week.  Patient also experienced worsening shortness of breath.  Patient takes Lasix 80 mg daily at home, dose of Lasix was changed to 40 mg by his PCP couple of months ago.  Since then patient has noticed a worsening swelling of the right lower extremity. He denies fever or chills. Has been coughing up clear phlegm. Denies abdominal pain. Denies dysuria  In the ED, chest x-ray showed mild pulmonary edema.  Patient was found to have extensive edema of the right lower extremity with abdominal distention.  80 mg of Lasix IV was given in the ED.  Currently patient is requiring 2 L/min of oxygen via nasal cannula.  Usually patient is not on oxygen at home.  Brief Admission Hx: 84 y/o male with CAD s/p CABG, PAD, type 2 DM with vascular complications, stage 4 CKD, HTN, hypothyroidism, chronic diastolic CHF presented with progressive dyspnea and pulmonary  edema.   MDM/Assessment & Plan:   1. Acute diastolic CHF exacerbation - Pt presented progressive dyspnea since his lasix dose was reduced outpatient.  He was volume overloaded with pulmonary edema.  He was diuresed with IV lasix and he is negative 2.7L and feeling better and wants to go home.  I am resuming home lasix dose at discharge and follow up with his cardiologist recommended.  Continue to follow weights, I/Os, lytes. Continue supplemental oxygen. EF 55-60% on echo 8/19.     2. Stage 4 CKD - stable, resume home lasix dose and follow up with nephrologist recommended.     3. Type 2 diabetes mellitus - poorly controlled with vascular complications - He has had some hypoglycemia this morning.  I have reduced his glargine dose and recommended frequent CBG testing at home.   4. Hypothyroidism- resumed home levothyroxine.  5. CAD s/p CABG - stable, no active symptoms, resumed nitrodur patch daily, aspirin, atorvastatin.   6. PAD - he is s/p left BKA, resumed home atorvastatin and aspirin.    DVT prophylaxis: heparin  Code Status: DNR  Family Communication: patient updated bedside, verbalized understanding  Disposition Plan: home    Discharge Diagnoses:  Active Problems:   Hyperlipidemia   Essential hypertension   CAD, ARTERY BYPASS GRAFT   Atrial fibrillation (HCC)   Uncontrolled type 2 diabetes mellitus with stage 4 chronic kidney disease (HCC)   Hypoxia   DM type 2 causing vascular disease (HCC)   Hypothyroidism   S/P CABG (coronary artery bypass graft)   Severe protein-calorie malnutrition (HCC)   Acute exacerbation  of CHF (congestive heart failure) (HCC)   Acute pulmonary edema (HCC)   SOB (shortness of breath)   DNR (do not resuscitate)   Discharge Instructions:  Allergies as of 11/28/2019   No Known Allergies     Medication List    STOP taking these medications   Advil PM 200-38 MG Tabs Generic drug: Ibuprofen-diphenhydrAMINE Cit     TAKE these medications    amLODipine 5 MG tablet Commonly known as: NORVASC Take 5 mg by mouth daily.   aspirin 81 MG tablet Take 81 mg by mouth at bedtime.   atorvastatin 40 MG tablet Commonly known as: LIPITOR Take 40 mg by mouth at bedtime.   Fish Oil 1200 MG Caps Take 1 capsule by mouth daily.   furosemide 40 MG tablet Commonly known as: LASIX Take 40 mg by mouth daily.   gabapentin 300 MG capsule Commonly known as: NEURONTIN Take 300-600 mg by mouth See admin instructions. 600mg  in the morning, 300mg  midday, and takes 600mg  at bedime   insulin aspart 100 UNIT/ML FlexPen Commonly known as: NOVOLOG Inject 1-12 Units into the skin 3 (three) times daily with meals. *As directed per sliding scale up to 30 units total daily  Less than 150=0  151-200=3  200-300=5  300-400=8  400-500=10  500-600= 12 units and drink lots of water   insulin glargine 100 UNIT/ML injection Commonly known as: LANTUS Inject 0.1 mLs (10 Units total) into the skin at bedtime. What changed: how much to take   levothyroxine 50 MCG tablet Commonly known as: SYNTHROID Take 50 mcg by mouth daily before breakfast.   pantoprazole 40 MG tablet Commonly known as: PROTONIX Take 40 mg by mouth daily.   potassium chloride 10 MEQ tablet Commonly known as: KLOR-CON Take 10 mEq by mouth daily.   traZODone 50 MG tablet Commonly known as: DESYREL Take 1 tablet (50 mg total) by mouth at bedtime as needed for sleep.      Follow-up Information    Herminio Commons, MD. Schedule an appointment as soon as possible for a visit in 3 week(s).   Specialty: Cardiology Contact information: [place] [address] [phone]        Liana Gerold, MD. Schedule an appointment as soon as possible for a visit in 2 week(s).   Specialty: Nephrology Why: Establish care for chronic kidney disease  Contact information: [address] [address] [phone]          No Known  Allergies Allergies as of 11/28/2019   No Known Allergies     Medication List    STOP taking these medications   Advil PM 200-38 MG Tabs Generic drug: Ibuprofen-diphenhydrAMINE Cit     TAKE these medications   amLODipine 5 MG tablet Commonly known as: NORVASC Take 5 mg by mouth daily.   aspirin 81 MG tablet Take 81 mg by mouth at bedtime.   atorvastatin 40 MG tablet Commonly known as: LIPITOR Take 40 mg by mouth at bedtime.   Fish Oil 1200 MG Caps Take 1 capsule by mouth daily.   furosemide 40 MG tablet Commonly known as: LASIX Take 40 mg by mouth daily.   gabapentin 300 MG capsule Commonly known as: NEURONTIN Take 300-600 mg by mouth See admin instructions. 600mg  in the morning, 300mg  midday, and takes 600mg  at bedime   insulin aspart 100 UNIT/ML FlexPen Commonly known as: NOVOLOG Inject 1-12 Units into the skin 3 (three) times daily with meals. *As  directed per sliding scale up to 30 units total daily  Less than 150=0  151-200=3  200-300=5  300-400=8  400-500=10  500-600= 12 units and drink lots of water   insulin glargine 100 UNIT/ML injection Commonly known as: LANTUS Inject 0.1 mLs (10 Units total) into the skin at bedtime. What changed: how much to take   levothyroxine 50 MCG tablet Commonly known as: SYNTHROID Take 50 mcg by mouth daily before breakfast.   pantoprazole 40 MG tablet Commonly known as: PROTONIX Take 40 mg by mouth daily.   potassium chloride 10 MEQ tablet Commonly known as: KLOR-CON Take 10 mEq by mouth daily.   traZODone 50 MG tablet Commonly known as: DESYREL Take 1 tablet (50 mg total) by mouth at bedtime as needed for sleep.       Procedures/Studies: DG Chest 2 View  Result Date: 11/25/2019 CLINICAL DATA:  Leg and abdominal swelling, shortness of breath EXAM: CHEST - 2 VIEW COMPARISON:  10/22/2019 FINDINGS: Cardiomegaly status post median sternotomy and CABG. Low volume examination with mild diffuse interstitial  opacity. The visualized skeletal structures are unremarkable. IMPRESSION: 1. Low volume examination with mild diffuse interstitial opacity, suggesting mild edema. 2. Cardiomegaly status post CABG. Electronically Signed   By: Eddie Candle M.D.   On: 11/25/2019 18:35   DG CHEST PORT 1 VIEW  Result Date: 11/27/2019 CLINICAL DATA:  Pulmonary edema. EXAM: PORTABLE CHEST 1 VIEW COMPARISON:  November 25, 2019. FINDINGS: Stable cardiomediastinal silhouette. Status post coronary bypass graft. No pneumothorax is noted. Right lung is clear. Stable left lung opacity is noted concerning for edema or atypical inflammation. Bony thorax is unremarkable. IMPRESSION: Stable left lung opacity as described above. Electronically Signed   By: Marijo Conception M.D.   On: 11/27/2019 12:32      Subjective: The patient is feeling much better.  He says he feels great.  He says that the swelling in legs is resolved now.  He wants to go home.     Discharge Exam: Vitals:   11/28/19 0558 11/28/19 0800  BP: 138/64 (!) 114/51  Pulse: (!) 55 65  Resp: 20 18  Temp: 98 F (36.7 C) 98.6 F (37 C)  SpO2: 100% 98%   Vitals:   11/27/19 2105 11/27/19 2114 11/28/19 0558 11/28/19 0800  BP: 123/64  138/64 (!) 114/51  Pulse: (!) 51  (!) 55 65  Resp: 18  20 18   Temp: 98 F (36.7 C)  98 F (36.7 C) 98.6 F (37 C)  TempSrc: Oral  Oral Oral  SpO2: 98% 98% 100% 98%  Weight:      Height:       General: Pt is alert, awake, not in acute distress Cardiovascular: normal S1/S2 +, no rubs, no gallops Respiratory: CTA bilaterally, no wheezing, no rhonchi Abdominal: Soft, NT, ND, bowel sounds + Extremities: no edema, no cyanosis   The results of significant diagnostics from this hospitalization (including imaging, microbiology, ancillary and laboratory) are listed below for reference.     Microbiology: Recent Results (from the past 240 hour(s))  SARS CORONAVIRUS 2 (TAT 6-24 HRS) Nasopharyngeal Nasopharyngeal Swab     Status:  None   Collection Time: 11/25/19  7:15 PM   Specimen: Nasopharyngeal Swab  Result Value Ref Range Status   SARS Coronavirus 2 NEGATIVE NEGATIVE Final    Comment: (NOTE) SARS-CoV-2 target nucleic acids are NOT DETECTED. The SARS-CoV-2 RNA is generally detectable in upper and lower respiratory specimens during the acute phase of infection. Negative results  do not preclude SARS-CoV-2 infection, do not rule out co-infections with other pathogens, and should not be used as the sole basis for treatment or other patient management decisions. Negative results must be combined with clinical observations, patient history, and epidemiological information. The expected result is Negative. Fact Sheet for Patients: SugarRoll.be Fact Sheet for Healthcare Providers: https://www.woods-mathews.com/ This test is not yet approved or cleared by the [place] FDA and  has been authorized for detection and/or diagnosis of SARS-CoV-2 by FDA under an Emergency Use Authorization (EUA). This EUA will remain  in effect (meaning this test can be used) for the duration of the COVID-19 declaration under Section 56 4(b)(1) of the Act, 21 U.S.C. section 360bbb-3(b)(1), unless the authorization is terminated or revoked sooner. Performed at [facility], [place] [address], [place], [place] [address]      Labs: BNP (last 3 results) Recent Labs    11/25/19 1732  BNP 99991111   Basic Metabolic Panel: Recent Labs  Lab 11/25/19 1731 11/26/19 0544 11/27/19 0633  NA 139 142 141  K 4.3 3.6 3.3*  CL 106 107 105  CO2 24 24 25   GLUCOSE 145* 94 60*  BUN 43* 45* 55*  CREATININE 2.52* 2.61* 3.01*  CALCIUM 8.5* 8.5* 8.1*  MG  --   --  1.9   Liver Function Tests: Recent Labs  Lab 11/26/19 0544  AST 16  ALT 18  ALKPHOS 74  BILITOT 0.4  PROT 6.2*  ALBUMIN 3.0*   No results for input(s): LIPASE, AMYLASE in the last 168 hours. No results for input(s): AMMONIA in  the last 168 hours. CBC: Recent Labs  Lab 11/25/19 1731 11/26/19 0544 11/27/19 0633  WBC 6.8 5.4 5.7  NEUTROABS 4.3  --   --   HGB 11.6* 10.2* 9.7*  HCT 38.1* 33.5* 31.8*  MCV 101.1* 100.3* 100.0  PLT 130* 125* 123*   Cardiac Enzymes: No results for input(s): CKTOTAL, CKMB, CKMBINDEX, TROPONINI in the last 168 hours. BNP: Invalid input(s): POCBNP CBG: Recent Labs  Lab 11/27/19 1130 11/27/19 1640 11/27/19 2106 11/28/19 0256 11/28/19 0735  GLUCAP 183* 132* 154* 129* 138*   D-Dimer No results for input(s): DDIMER in the last 72 hours. Hgb A1c Recent Labs    11/25/19 1731  HGBA1C 10.2*   Lipid Profile No results for input(s): CHOL, HDL, LDLCALC, TRIG, CHOLHDL, LDLDIRECT in the last 72 hours. Thyroid function studies No results for input(s): TSH, T4TOTAL, T3FREE, THYROIDAB in the last 72 hours.  Invalid input(s): FREET3 Anemia work up No results for input(s): VITAMINB12, FOLATE, FERRITIN, TIBC, IRON, RETICCTPCT in the last 72 hours. Urinalysis    Component Value Date/Time   COLORURINE YELLOW 06/07/2018 1814   APPEARANCEUR CLOUDY (A) 06/07/2018 1814   LABSPEC 1.013 06/07/2018 1814   PHURINE 5.0 06/07/2018 1814   GLUCOSEU 150 (A) 06/07/2018 1814   HGBUR SMALL (A) 06/07/2018 1814   BILIRUBINUR NEGATIVE 06/07/2018 1814   KETONESUR NEGATIVE 06/07/2018 1814   PROTEINUR 100 (A) 06/07/2018 1814   UROBILINOGEN 1.0 05/12/2009 1354   NITRITE NEGATIVE 06/07/2018 1814   LEUKOCYTESUR LARGE (A) 06/07/2018 1814   Sepsis Labs Invalid input(s): PROCALCITONIN,  WBC,  LACTICIDVEN Microbiology Recent Results (from the past 240 hour(s))  SARS CORONAVIRUS 2 (TAT 6-24 HRS) Nasopharyngeal Nasopharyngeal Swab     Status: None   Collection Time: 11/25/19  7:15 PM   Specimen: Nasopharyngeal Swab  Result Value Ref Range Status   SARS Coronavirus 2 NEGATIVE NEGATIVE Final    Comment: (NOTE)  SARS-CoV-2 target nucleic acids are NOT DETECTED. The SARS-CoV-2 RNA is generally  detectable in upper and lower respiratory specimens during the acute phase of infection. Negative results do not preclude SARS-CoV-2 infection, do not rule out co-infections with other pathogens, and should not be used as the sole basis for treatment or other patient management decisions. Negative results must be combined with clinical observations, patient history, and epidemiological information. The expected result is Negative. Fact Sheet for Patients: SugarRoll.be Fact Sheet for Healthcare Providers: https://www.woods-mathews.com/ This test is not yet approved or cleared by the [place] FDA and  has been authorized for detection and/or diagnosis of SARS-CoV-2 by FDA under an Emergency Use Authorization (EUA). This EUA will remain  in effect (meaning this test can be used) for the duration of the COVID-19 declaration under Section 56 4(b)(1) of the Act, 21 U.S.C. section 360bbb-3(b)(1), unless the authorization is terminated or revoked sooner. Performed at [facility], [place] [address] [place], [place] [address]    Time coordinating discharge: 31 minutes   SIGNED:  Irwin Brakeman, MD  [facility] 11/28/2019, 10:34 AM How to contact the [facility] Attending or Consulting provider [place] or covering provider during after hours [place], for this patient?  1. Check the care team in [facility] and look for a) attending/consulting [facility] provider listed and b) the [facility] team listed 2. Log into [email or web address] and use [facility]'s universal password to access. If you do not have the password, please contact the hospital operator. 3. Locate the [facility] provider you are looking for under [facility] and page to a number that you can be directly reached. 4. If you still have difficulty reaching the provider, please page the [facility] (Director on Call) for the Hospitalists listed on amion for assistance.

## 2019-11-28 NOTE — Discharge Instructions (Signed)
Pulmonary Edema Pulmonary edema is unusual buildup of fluid in the lungs. It makes it hard for a person to breathe. This condition is an emergency. Follow these instructions at home: Medicines  Take over-the-counter and prescription medicines only as told by your doctor.  If you were prescribed an antibiotic medicine, take it as told by your doctor. Do not stop taking the antibiotic even if you start to feel better.  Ask your doctor to help you write a plan with information about each medicine you take. This may include: ? Why you are taking it. ? Possible side effects. ? Best time of day to take it. ? Foods to take with it, or foods to avoid. ? When to stop taking it.  Make a list of each medicine, vitamin, or herbal supplement you take. ? Keep the list with you at all times. ? Show the list to your doctor at each visit and before starting a new medicine. ? Keep the list up to date. Lifestyle   Do regular exercise as told by your doctor. It is important to do it safely. You can do this by: ? Asking your doctor what exercises and activities are good and safe for you to do. ? Pacing your activities. This will prevent shortness of breath or chest pain. ? Resting for at least 1 hour before and after meals. ? Asking about cardiac rehabilitation programs. These may include: ? Education. ? Exercise plans. ? Counseling.  Eat a heart-healthy diet that is low in salt, saturated fat, and cholesterol. Your doctor may suggest foods that are high in fiber, such as: ? Fresh fruits and vegetables. ? Whole grains. ? Beans.  Do not use any products that contain nicotine or tobacco, such as cigarettes and e-cigarettes. If you need help quitting, ask your doctor. General instructions  Keep a record of your weight. ? Record your hospital or clinic weight. When you get home, compare it to your scale and record your weight. ? Weigh yourself first thing in the morning each day, and record the  weights. You should weigh yourself every morning after you pee and before you eat breakfast. Wear the same amount of clothing each time you weigh yourself. ? Share your weight record with your doctor. These can help your doctor see if your body is holding extra fluid. ? Tell your doctor right away if you have gained weight quickly, or if you have gained weight as told by your doctor. Your medicines may need to be adjusted.  Check your blood pressure as often as told by your doctor. ? Buy a home blood pressure cuff at your drugstore. ? Record your blood pressure readings. Bring them with you for your clinic visits.  Stay at a healthy weight. Ask your doctor what weight is healthy for you.  Think about doing therapy or being a part of a support group.  Keep all follow-up visits as told by your doctor. This is important. Contact a doctor if:  You have questions about your medicines.  You miss a dose of your medicine. Get help right away if:  You have very bad chest pain, especially if the pain is crushing or pressure-like and spreads to the arms, back, neck, or jaw.  You have more swelling in your hands, feet, ankles, or belly (abdomen).  You feel sick to your stomach (nauseous).  You have strange sweating.  Your skin turns blue or pale.  Your shortness of breath gets worse.  You feel dizzy or  unsteady.  Your vision is blurry.  You have a headache.  You cough up bloody split.  You cannot sleep because it is hard to breathe.  You start to feel a "jumping" or "fluttering" sensation (palpitations) in the chest that is unusual for you.  You feel like you cannot get enough air.  You gain weight rapidly. These symptoms may be an emergency. Do not wait to see if the symptoms will go away. Get medical help right away. Call your local emergency services (911 in the [place] Do not drive yourself to the hospital. Summary  Pulmonary edema is unusual fluid buildup in the lungs that  can make it hard to breathe.  This condition is an emergency.  Keep a record of your weight. Call your doctor if you gain weight rapidly. This information is not intended to replace advice given to you by your health care provider. Make sure you discuss any questions you have with your health care provider. Document Released: 11/19/2009 Document Revised: 11/13/2017 Document Reviewed: 03/03/2017 Elsevier Patient Education  [place], Adult Shortness of breath is when a person has trouble breathing enough air or when a person feels like she or he is having trouble breathing in enough air. Shortness of breath could be a sign of a medical problem. Follow these instructions at home:   Pay attention to any changes in your symptoms.  Do not use any products that contain nicotine or tobacco, such as cigarettes, e-cigarettes, and chewing tobacco.  Do not smoke. Smoking is a common cause of shortness of breath. If you need help quitting, ask your health care provider.  Avoid things that can irritate your airways, such as: ? Mold. ? Dust. ? Air pollution. ? Chemical fumes. ? Things that can cause allergy symptoms (allergens), if you have allergies.  Keep your living space clean and free of mold and dust.  Rest as needed. Slowly return to your usual activities.  Take over-the-counter and prescription medicines only as told by your health care provider. This includes oxygen therapy and inhaled medicines.  Keep all follow-up visits as told by your health care provider. This is important. Contact a health care provider if:  Your condition does not improve as soon as expected.  You have a hard time doing your normal activities, even after you rest.  You have new symptoms. Get help right away if:  Your shortness of breath gets worse.  You have shortness of breath when you are resting.  You feel light-headed or you faint.  You have a cough that is not  controlled with medicines.  You cough up blood.  You have pain with breathing.  You have pain in your chest, arms, shoulders, or abdomen.  You have a fever.  You cannot walk up stairs or exercise the way that you normally do. These symptoms may represent a serious problem that is an emergency. Do not wait to see if the symptoms will go away. Get medical help right away. Call your local emergency services (911 in the [place] Do not drive yourself to the hospital. Summary  Shortness of breath is when a person has trouble breathing enough air. It can be a sign of a medical problem.  Avoid things that irritate your lungs, such as smoking, pollution, mold, and dust.  Pay attention to changes in your symptoms and contact your health care provider if you have a hard time completing daily activities because of shortness of breath. This  information is not intended to replace advice given to you by your health care provider. Make sure you discuss any questions you have with your health care provider. Document Released: 08/26/2001 Document Revised: 05/03/2018 Document Reviewed: 05/03/2018 Elsevier Patient Education  2020 [place].  Heart Failure, Diagnosis  Heart failure means that your heart is not able to pump blood in the right way. This makes it hard for your body to work well. Heart failure is usually a long-term (chronic) condition. You must take good care of yourself and follow your treatment plan from your doctor. What are the causes? This condition may be caused by:  High blood pressure.  Build up of cholesterol and fat in the arteries.  Heart attack. This injures the heart muscle.  Heart valves that do not open and close properly.  Damage of the heart muscle. This is also called cardiomyopathy.  Lung disease.  Abnormal heart rhythms. What increases the risk? The risk of heart failure goes up as a person ages. This condition is also more likely to develop in people  who:  Are overweight.  Are male.  Smoke or chew tobacco.  Abuse alcohol or illegal drugs.  Have taken medicines that can damage the heart.  Have diabetes.  Have abnormal heart rhythms.  Have thyroid problems.  Have low blood counts (anemia). What are the signs or symptoms? Symptoms of this condition include:  Shortness of breath.  Coughing.  Swelling of the feet, ankles, legs, or belly.  Losing weight for no reason.  Trouble breathing.  Waking from sleep because of the need to sit up and get more air.  Rapid heartbeat.  Being very tired.  Feeling dizzy, or feeling like you may pass out (faint).  Having no desire to eat.  Feeling like you may vomit (nauseous).  Peeing (urinating) more at night.  Feeling confused. How is this treated?     This condition may be treated with:  Medicines. These can be given to treat blood pressure and to make the heart muscles stronger.  Changes in your daily life. These may include eating a healthy diet, staying at a healthy body weight, quitting tobacco and illegal drug use, or doing exercises.  Surgery. Surgery can be done to open blocked valves, or to put devices in the heart, such as pacemakers.  A donor heart (heart transplant). You will receive a healthy heart from a donor. Follow these instructions at home:  Treat other conditions as told by your doctor. These may include high blood pressure, diabetes, thyroid disease, or abnormal heart rhythms.  Learn as much as you can about heart failure.  Get support as you need it.  Keep all follow-up visits as told by your doctor. This is important. Summary  Heart failure means that your heart is not able to pump blood in the right way.  This condition is caused by high blood pressure, heart attack, or damage of the heart muscle.  Symptoms of this condition include shortness of breath and swelling of the feet, ankles, legs, or belly. You may also feel very tired or  feel like you may vomit.  You may be treated with medicines, surgery, or changes in your daily life.  Treat other health conditions as told by your doctor. This information is not intended to replace advice given to you by your health care provider. Make sure you discuss any questions you have with your health care provider. Document Released: 09/09/2008 Document Revised: 02/18/2019 Document Reviewed: 02/18/2019 Elsevier Patient  Education  [place] INFORMATION: PAY CLOSE ATTENTION   PHYSICIAN DISCHARGE INSTRUCTIONS  Follow with Primary care provider  No primary care provider on file.  and other consultants as instructed by your Hospitalist Physician  [place] IF SYMPTOMS COME BACK, WORSEN OR NEW PROBLEM DEVELOPS   Please note: You were cared for by a hospitalist during your hospital stay. Every effort will be made to forward records to your primary care provider.  You can request that your primary care provider send for your hospital records if they have not received them.  Once you are discharged, your primary care physician will handle any further medical issues. Please note that NO REFILLS for any discharge medications will be authorized once you are discharged, as it is imperative that you return to your primary care physician (or establish a relationship with a primary care physician if you do not have one) for your post hospital discharge needs so that they can reassess your need for medications and monitor your lab values.  Please get a complete blood count and chemistry panel checked by your Primary MD at your next visit, and again as instructed by your Primary MD.  Get Medicines reviewed and adjusted: Please take all your medications with you for your next visit with your Primary MD  Laboratory/radiological data: Please request your Primary MD to go over all hospital tests and procedure/radiological results at the follow  up, please ask your primary care provider to get all Hospital records sent to his/her office.  In some cases, they will be blood work, cultures and biopsy results pending at the time of your discharge. Please request that your primary care provider follow up on these results.  If you are diabetic, please bring your blood sugar readings with you to your follow up appointment with primary care.    Please call and make your follow up appointments as soon as possible.    Also Note the following: If you experience worsening of your admission symptoms, develop shortness of breath, life threatening emergency, suicidal or homicidal thoughts you must seek medical attention immediately by calling 911 or calling your MD immediately  if symptoms less severe.  You must read complete instructions/literature along with all the possible adverse reactions/side effects for all the Medicines you take and that have been prescribed to you. Take any new Medicines after you have completely understood and accpet all the possible adverse reactions/side effects.   Do not drive when taking Pain medications or sleeping medications (Benzodiazepines)  Do not take more than prescribed Pain, Sleep and Anxiety Medications. It is not advisable to combine anxiety,sleep and pain medications without talking with your primary care practitioner  Special Instructions: If you have smoked or chewed Tobacco  in the last 2 yrs please stop smoking, stop any regular Alcohol  and or any Recreational drug use.  Wear Seat belts while driving.  Do not drive if taking any narcotic, mind altering or controlled substances or recreational drugs or alcohol.

## 2019-11-28 NOTE — TOC Initial Note (Signed)
Transition of Care [facility]) - Initial/Assessment Note    Patient Details  Name: [name] MRN: [id number] Date of Birth: [birth year]  [facility] CM/SW Contact:    Boneta Lucks, RN Phone Number: 11/28/2019, 10:58 AM  Clinical Narrative:   Patient admitted for acute exacerbation of CHF. PT is recommending HHPT, patient agrees, has no preferences to companies.  Georgina Snell with [facility] took the referral for HHPT and aware patient is discharging today.  Added information to AVS.                            Expected Discharge Plan: [place] Barriers to Discharge: Barriers Resolved   Patient Goals and CMS Choice Patient states their goals for this hospitalization and ongoing recovery are:: to go home. CMS Medicare.gov Compare Post Acute Care list provided to:: Patient Choice offered to / list presented to : Patient  Expected Discharge Plan and Services Expected Discharge Plan: [place]         Expected Discharge Date: 11/28/19                    HH Arranged: PT [place]: [place] Date [place]: 11/28/19 Time HH Agency Contacted: [id number] Representative spoke with at [place]: Georgina Snell  Prior Living Arrangements/Services   Lives with:: Self   Do you feel safe going back to the place where you live?: Yes      Need for Family Participation in Patient Care: Yes (Comment) Care giver support system in place?: Yes (comment)   Criminal Activity/Legal Involvement Pertinent to Current Situation/Hospitalization: No - Comment as needed  Activities of Daily Living Home Assistive Devices/Equipment: Wheelchair, Eyeglasses ADL Screening (condition at time of admission) Patient's cognitive ability adequate to safely complete daily activities?: Yes Is the patient deaf or have difficulty hearing?: No Does the patient have difficulty seeing, even when wearing glasses/contacts?: No Does the patient have difficulty  concentrating, remembering, or making decisions?: No Patient able to express need for assistance with ADLs?: Yes Does the patient have difficulty dressing or bathing?: No Independently performs ADLs?: Yes (appropriate for developmental age) Does the patient have difficulty walking or climbing stairs?: Yes Weakness of Legs: Left(BKA) Weakness of Arms/Hands: None    Orientation: : Oriented to Self, Oriented to Place, Oriented to  Time Alcohol / Substance Use: Not Applicable Psych Involvement: No (comment)  Admission diagnosis:  Acute pulmonary edema (HCC) [J81.0] SOB (shortness of breath) [R06.02] Acute exacerbation of CHF (congestive heart failure) ([place]) [I50.9] Patient Active Problem List   Diagnosis Date Noted  . DNR (do not resuscitate) 11/27/2019  . Acute pulmonary edema ([place]) 11/26/2019  . SOB (shortness of breath) 11/26/2019  . Acute exacerbation of CHF (congestive heart failure) ([place]) 11/25/2019  . Pressure injury of skin 07/23/2018  . Stage 3 chronic kidney disease   . Coronary artery disease involving coronary bypass graft of native heart without angina pectoris   . Poorly controlled type 2 diabetes mellitus ([place])   . Congestive heart failure ([place])   . Postoperative pain   . Hyperkalemia   . Acute blood loss anemia   . Below knee amputation status, left 07/16/2018  . Cellulitis of left foot   . Severe protein-calorie malnutrition ([place])   . Uncontrolled type 2 diabetes mellitus with polyneuropathy ([place])   . PAD (peripheral artery disease) ([place])   . Preoperative cardiovascular examination   .  S/P CABG (coronary artery bypass graft)   . Atherosclerosis of native arteries of extremities with gangrene, left leg ([place]) 06/07/2018  . Atherosclerosis of native arteries of the extremities with ulceration ([place]) 05/26/2018  . Acute respiratory failure with hypoxia ([place])   . Hypothyroidism 01/08/2018  . Nausea and vomiting 12/23/2017  . DM type 2 causing vascular disease ([place])  05/07/2016  . Essential hypertension, benign 05/07/2016  . Other specified hypothyroidism 05/07/2016  . Bilateral carotid bruits 03/11/2016  . Acute on chronic diastolic CHF (congestive heart failure) ([place])   . Sepsis ([place]) 02/22/2016  . Uncontrolled type 2 diabetes mellitus with stage 4 chronic kidney disease ([place]) 02/22/2016  . Demand ischemia ([place]) 02/22/2016  . UTI (urinary tract infection) 02/22/2016  . Acute renal failure superimposed on stage 3 chronic kidney disease ([place])   . Pyrexia   . Arterial hypotension   . Hypoxia   . OBESITY, UNSPECIFIED 08/24/2009  . Hyperlipidemia 08/15/2009  . Essential hypertension 08/15/2009  . CAD, ARTERY BYPASS GRAFT 08/15/2009  . Atrial fibrillation ([place]) 08/15/2009   PCP:  No primary care provider on file. Pharmacy:   [facility], [address] [place] [address] Phone: [phone] Fax: [phone]    Readmission Risk Interventions No flowsheet data found.

## 2019-11-28 NOTE — Progress Notes (Signed)
Home Health Care choices:   [facility] [phone] Quality rating Patient survey rating 2. [facility] [phone] Quality rating Patient survey rating 3. [facility] [phone] Quality rating Patient survey rating 4. [facility] [phone] Quality rating Patient survey rating 5. [place] [phone] Quality rating Patient survey rating 6. [facility] [phone] Quality rating Patient survey rating 7. [place] [phone] Quality rating Patient survey rating 8. [place] [phone] Quality rating Patient survey rating 9. [place] [facility] [phone] Quality rating Patient survey rating 11 10. [facility] [phone] Quality rating Patient survey rating 11. [place] [phone] Quality rating Patient survey rating 12. [facility] [place] [phone] Quality rating Patient survey rating 13. [facility] [phone] Quality rating Patient survey rating Not available11 14. [facility] [phone] Quality rating Patient survey rating

## 2019-11-28 NOTE — Progress Notes (Signed)
Discharge instructions reviewed with patient. Given AVS. Patient verbalized understanding of instructions, prescription to pick up at his pharmacy, follow-up appointments as instructed and home health arranged by case management. IV site removed, site clean, dry and intact with gauze dressing. Stable condition awaiting his grand-daughter's arrival to take him home. Donavan Foil, RN

## 2019-12-12 NOTE — Progress Notes (Deleted)
Cardiology Office Note    Date:  12/12/2019   ID:  [name], DOB [birth year], MRN [id number]  PCP:  No primary care provider on file.  Cardiologist: Kate Sable, MD EPS: None  No chief complaint on file.   History of Present Illness:  [name] is a 84 y.o. male with history of CAD S/P CABG 2010, PAF, chronic diastolic CHF, CKD stage 4, Afib-no regular f/u and not anticoagulated, PAD, IDDM, HTN, HLD, hypothyroidism. Last seen by cardiology 05/2018 cardiac clearance for left common femoral artery to below the knee popliteal artery bypass for gangrene. Underwent LBKA 07/2018. Echo 06/08/18 normal LVEF 55-60% grade 2 DD.  Discharged 11/28/19 after admission for pulmonary edema. Lasix had been reduced by PCP from 80 mg to 40 mg daily. He diuresed 2.7 L and sent home on lasix 40 mg daily and told to f/u with nephrologist.  Past Medical History:  Diagnosis Date  . Anemia   . Arthritis   . Carotid artery disease ([place])   . CHF (congestive heart failure) ([place])   . CKD (chronic kidney disease), stage IV ([place])   . Coronary artery disease 2010   Status post coronary artery bypass grafting  . Diabetes mellitus    Type II  . Dyspnea   . Dysrhythmia   . Foot ulcer ([place])   . History of blood transfusion 05/2018  . Hyperlipidemia   . Hypertension   . Hypothyroidism   . Myocardial infarction ([place]) 2010  . PAD (peripheral artery disease) ([place])   . Postoperative atrial fibrillation ([place])   . Thrombocytopenia ([place])     Past Surgical History:  Procedure Laterality Date  . AMPUTATION Left 06/16/2018   Procedure: LEFT TRANSMETATARSAL AMPUTATION;  Surgeon: [name], MD;  Location: [facility];  Service: Orthopedics;  Laterality: Left;  . AMPUTATION Left 07/16/2018   Procedure: LEFT BELOW KNEE AMPUTATION;  Surgeon: [name], MD;  Location: [facility];  Service: Orthopedics;  Laterality: Left;  . AORTOGRAM Left 06/03/2018   Procedure: ABDOMINAL AORTOGRAM WITH CARBON DIOXIDE LEFT  LOWER EXTREMITY RUNOFF;  Surgeon: Conrad Sims, MD;  Location: [facility];  Service: Vascular;  Laterality: Left;  . BELOW KNEE LEG AMPUTATION Left 07/16/2018  . carotid insuff    . CORONARY ARTERY BYPASS GRAFT  2010   LIMA to LAD, SVG to diagonal, SVG to circumflex, marginal, SVG to posterior descending branch.  . FEMORAL-POPLITEAL BYPASS GRAFT Left 06/10/2018   Procedure: LEFT COMMON FEMORAL TO BELOW KNEE POPLITEAL BYPASS GRAFT WITH PROPATEN;  Surgeon: Conrad [place], MD;  [facility];  Service: Vascular;  Laterality: Left;    Current Medications: No outpatient medications have been marked as taking for the 12/19/19 encounter (Appointment) with Imogene Burn, PA-C.     Allergies:   Patient has no known allergies.   Social History   Socioeconomic History  . Marital status: Widowed    Spouse name: Not on file  . Number of children: Not on file  . Years of education: Not on file  . Highest education level: Not on file  Occupational History  . Not on file  Tobacco Use  . Smoking status: Never Smoker  . Smokeless tobacco: Never Used  Substance and Sexual Activity  . Alcohol use: Not Currently    Alcohol/week: 0.0 standard drinks  . Drug use: No  . Sexual activity: Not on file  Other Topics Concern  . Not on file  Social History Narrative   Lives  with granddaughter   Social Determinants of Health   Financial Resource Strain:   . Difficulty of Paying Living Expenses: Not on file  Food Insecurity:   . Worried About Charity fundraiser in the Last Year: Not on file  . Ran Out of Food in the Last Year: Not on file  Transportation Needs:   . Lack of Transportation (Medical): Not on file  . Lack of Transportation (Non-Medical): Not on file  Physical Activity:   . Days of Exercise per Week: Not on file  . Minutes of Exercise per Session: Not on file  Stress:   . Feeling of Stress : Not on file  Social Connections:   . Frequency of Communication with Friends and Family: Not  on file  . Frequency of Social Gatherings with Friends and Family: Not on file  . Attends Religious Services: Not on file  . Active Member of Clubs or Organizations: Not on file  . Attends Archivist Meetings: Not on file  . Marital Status: Not on file     Family History:  The patient's ***family history includes Heart attack in his brother and father.   ROS:   Please see the history of present illness.    ROS All other systems reviewed and are negative.   PHYSICAL EXAM:   VS:  There were no vitals taken for this visit.  Physical Exam  GEN: Well nourished, well developed, in no acute distress  HEENT: normal  Neck: no JVD, carotid bruits, or masses Cardiac:RRR; no murmurs, rubs, or gallops  Respiratory:  clear to auscultation bilaterally, normal work of breathing GI: soft, nontender, nondistended, + BS Ext: without cyanosis, clubbing, or edema, Good distal pulses bilaterally MS: no deformity or atrophy  Skin: warm and dry, no rash Neuro:  Alert and Oriented x 3, Strength and sensation are intact Psych: euthymic mood, full affect  Wt Readings from Last 3 Encounters:  11/25/19 180 lb (81.6 kg)  12/23/18 171 lb (77.6 kg)  09/09/18 171 lb (77.6 kg)      Studies/Labs Reviewed:   EKG:  EKG is*** ordered today.  The ekg ordered today demonstrates ***  Recent Labs: 11/25/2019: B Natriuretic Peptide 37.0 11/26/2019: ALT 18 11/27/2019: BUN 55; Creatinine, Ser 3.01; Hemoglobin 9.7; Magnesium 1.9; Platelets 123; Potassium 3.3; Sodium 141   Lipid Panel    Component Value Date/Time   CHOL 126 01/09/2018 0452   TRIG 89 01/09/2018 0452   HDL 34 (L) 01/09/2018 0452   CHOLHDL 3.7 01/09/2018 0452   VLDL 18 01/09/2018 0452   LDLCALC 74 01/09/2018 0452    Additional studies/ records that were reviewed today include:  Echo 06/08/18  Study Conclusions   - Left ventricle: The cavity size was normal. Wall thickness was   increased in a pattern of mild LVH. Systolic  function was normal.   The estimated ejection fraction was in the range of 55% to 60%.   Wall motion was normal; there were no regional wall motion   abnormalities. Features are consistent with a pseudonormal left   ventricular filling pattern, with concomitant abnormal relaxation   and increased filling pressure (grade 2 diastolic dysfunction). - Aortic valve: Mildly calcified annulus. Mildly thickened, mildly   calcified leaflets. - Mitral valve: Mildly calcified annulus. Moderately thickened,   moderately calcified leaflets .      ASSESSMENT:    1. Coronary artery disease involving coronary bypass graft of native heart without angina pectoris   2. Chronic diastolic congestive  heart failure ([place])   3. CKD (chronic kidney disease) stage 4, GFR 15-29 ml/min (HCC)   4. Essential hypertension   5. Poorly controlled type 2 diabetes mellitus ([place])   6. Mixed hyperlipidemia      PLAN:  In order of problems listed above:  CAD S/P CABG [id number]  Chronic diastolic CHF with recent hospitalization after lasix reduced from 80 mg daily to 40 mg daily. LVEF 55-60% grade 2 DD on echo 05/2018  CKD stage 4, Crt 2.5-3.01   HTN  Type 2 DM-insulin dependent poorly controlled  HLD  History of Afib in chart but no regular f/u or anticoagulation  Medication Adjustments/Labs and Tests Ordered: Current medicines are reviewed at length with the patient today.  Concerns regarding medicines are outlined above.  Medication changes, Labs and Tests ordered today are listed in the Patient Instructions below. There are no Patient Instructions on file for this visit.   Sumner Boast, PA-C  12/12/2019 8:53 AM    [facility] [place], [place],   [address] Phone: [phone]; Fax: [phone]

## 2019-12-19 ENCOUNTER — Ambulatory Visit: Payer: Medicare HMO | Admitting: Physician Assistant

## 2019-12-27 ENCOUNTER — Ambulatory Visit: Payer: Medicare HMO | Admitting: Student

## 2019-12-27 NOTE — Progress Notes (Deleted)
Cardiology Office Note    Date:  12/27/2019   ID:  [name], DOB [birth year], MRN [id number]  PCP:  No primary care provider on file.  Cardiologist: Kate Sable, MD  (no outpatient follow-up since 2017)  No chief complaint on file.   History of Present Illness:    [name] is a 85 y.o. male with past medical history of CAD (s/p CABG in 2010), HTN, HLD, IDDM, chronic diastolic CHF, remote history of paroxysmal atrial fibrillation (not on anticoagulation due to no recent occurrence), PAD and Stage 4 CKD who presents to the office today for hospital follow-up.  He was last evaluated by the cardiology service in 05/2018 during admission for left foot gangrene and underwent left common femoral artery to below the knee popliteal bypass bu Dr. Bridgett Larsson.  He ultimately required left BKA in 07/2018.  Most recently, he presented to [facility] ED on 11/25/2019 for evaluation of worsening lower extremity edema and abdominal swelling. BNP was 37 but CXR showed mild edema and he was volume overloaded by examination.  He received IV Lasix during admission and this was transitioned to PO Lasix 40mg  daily at discharge. A discharge weight is not recorded but was 180 lbs on most recent check.   Past Medical History:  Diagnosis Date  . Anemia   . Arthritis   . Carotid artery disease ([place])   . CHF (congestive heart failure) ([place])   . CKD (chronic kidney disease), stage IV ([place])   . Coronary artery disease 2010   Status post coronary artery bypass grafting  . Diabetes mellitus    Type II  . Dyspnea   . Dysrhythmia   . Foot ulcer ([place])   . History of blood transfusion 05/2018  . Hyperlipidemia   . Hypertension   . Hypothyroidism   . Myocardial infarction ([place]) 2010  . PAD (peripheral artery disease) ([place])   . Postoperative atrial fibrillation ([place])   . Thrombocytopenia ([place])     Past Surgical History:  Procedure Laterality Date  . AMPUTATION Left 06/16/2018   Procedure: LEFT  TRANSMETATARSAL AMPUTATION;  Surgeon: [name], MD;  Location: [facility];  Service: Orthopedics;  Laterality: Left;  . AMPUTATION Left 07/16/2018   Procedure: LEFT BELOW KNEE AMPUTATION;  Surgeon: [name], MD;  Location: [facility];  Service: Orthopedics;  Laterality: Left;  . AORTOGRAM Left 06/03/2018   Procedure: ABDOMINAL AORTOGRAM WITH CARBON DIOXIDE LEFT LOWER EXTREMITY RUNOFF;  Surgeon: Conrad [place], MD;  Location: [facility];  Service: Vascular;  Laterality: Left;  . BELOW KNEE LEG AMPUTATION Left 07/16/2018  . carotid insuff    . CORONARY ARTERY BYPASS GRAFT  2010   LIMA to LAD, SVG to diagonal, SVG to circumflex, marginal, SVG to posterior descending branch.  . FEMORAL-POPLITEAL BYPASS GRAFT Left 06/10/2018   Procedure: LEFT COMMON FEMORAL TO BELOW KNEE POPLITEAL BYPASS GRAFT WITH PROPATEN;  Surgeon: Conrad [place], MD;  Location: [facility];  Service: Vascular;  Laterality: Left;    Current Medications: Outpatient Medications Prior to Visit  Medication Sig Dispense Refill  . amLODipine (NORVASC) 5 MG tablet Take 5 mg by mouth daily.    Marland Kitchen aspirin 81 MG tablet Take 81 mg by mouth at bedtime.     Marland Kitchen atorvastatin (LIPITOR) 40 MG tablet Take 40 mg by mouth at bedtime.   3  . furosemide (LASIX) 40 MG tablet Take 40 mg by mouth daily.     Marland Kitchen gabapentin (NEURONTIN) 300 MG capsule  Take 300-600 mg by mouth See admin instructions. 600mg  in the morning, 300mg  midday, and takes 600mg  at bedime    . insulin aspart (NOVOLOG) 100 UNIT/ML FlexPen Inject 1-12 Units into the skin 3 (three) times daily with meals. *As directed per sliding scale up to 30 units total daily  Less than 150=0  151-200=3  200-300=5  300-400=8  400-500=10  500-600= 12 units and drink lots of water    . insulin glargine (LANTUS) 100 UNIT/ML injection Inject 0.1 mLs (10 Units total) into the skin at bedtime. 10 mL 11  . levothyroxine (SYNTHROID, LEVOTHROID) 50 MCG tablet Take 50 mcg by mouth daily before breakfast.     . Omega-3  Fatty Acids (FISH OIL) 1200 MG CAPS Take 1 capsule by mouth daily.    . pantoprazole (PROTONIX) 40 MG tablet Take 40 mg by mouth daily.    . potassium chloride (K-DUR,KLOR-CON) 10 MEQ tablet Take 10 mEq by mouth daily.    . traZODone (DESYREL) 50 MG tablet Take 1 tablet (50 mg total) by mouth at bedtime as needed for sleep. 30 tablet 0   No facility-administered medications prior to visit.     Allergies:   Patient has no known allergies.   Social History   Socioeconomic History  . Marital status: Widowed    Spouse name: Not on file  . Number of children: Not on file  . Years of education: Not on file  . Highest education level: Not on file  Occupational History  . Not on file  Tobacco Use  . Smoking status: Never Smoker  . Smokeless tobacco: Never Used  Substance and Sexual Activity  . Alcohol use: Not Currently    Alcohol/week: 0.0 standard drinks  . Drug use: No  . Sexual activity: Not on file  Other Topics Concern  . Not on file  Social History Narrative   Lives with granddaughter   Social Determinants of Health   Financial Resource Strain:   . Difficulty of Paying Living Expenses: Not on file  Food Insecurity:   . Worried About Charity fundraiser in the Last Year: Not on file  . Ran Out of Food in the Last Year: Not on file  Transportation Needs:   . Lack of Transportation (Medical): Not on file  . Lack of Transportation (Non-Medical): Not on file  Physical Activity:   . Days of Exercise per Week: Not on file  . Minutes of Exercise per Session: Not on file  Stress:   . Feeling of Stress : Not on file  Social Connections:   . Frequency of Communication with Friends and Family: Not on file  . Frequency of Social Gatherings with Friends and Family: Not on file  . Attends Religious Services: Not on file  . Active Member of Clubs or Organizations: Not on file  . Attends Archivist Meetings: Not on file  . Marital Status: Not on file     Family  History:  The patient's ***family history includes Heart attack in his brother and father.   Review of Systems:   Please see the history of present illness.     General:  No chills, fever, night sweats or weight changes.  Cardiovascular:  No chest pain, dyspnea on exertion, edema, orthopnea, palpitations, paroxysmal nocturnal dyspnea. Dermatological: No rash, lesions/masses Respiratory: No cough, dyspnea Urologic: No hematuria, dysuria Abdominal:   No nausea, vomiting, diarrhea, bright red blood per rectum, melena, or hematemesis Neurologic:  No visual changes, wkns, changes  in mental status. All other systems reviewed and are otherwise negative except as noted above.   Physical Exam:    VS:  There were no vitals taken for this visit.   General: Well developed, well nourished,male appearing in no acute distress. Head: Normocephalic, atraumatic, sclera non-icteric, no xanthomas, nares are without discharge.  Neck: No carotid bruits. JVD not elevated.  Lungs: Respirations regular and unlabored, without wheezes or rales.  Heart: ***Regular rate and rhythm. No S3 or S4.  No murmur, no rubs, or gallops appreciated. Abdomen: Soft, non-tender, non-distended with normoactive bowel sounds. No hepatomegaly. No rebound/guarding. No obvious abdominal masses. Msk:  Strength and tone appear normal for age. No joint deformities or effusions. Extremities: No clubbing or cyanosis. No edema.  Distal pedal pulses are 2+ bilaterally. Neuro: Alert and oriented X 3. Moves all extremities spontaneously. No focal deficits noted. Psych:  Responds to questions appropriately with a normal affect. Skin: No rashes or lesions noted  Wt Readings from Last 3 Encounters:  11/25/19 180 lb (81.6 kg)  12/23/18 171 lb (77.6 kg)  09/09/18 171 lb (77.6 kg)        Studies/Labs Reviewed:   EKG:  EKG is*** ordered today.  The ekg ordered today demonstrates ***  Recent Labs: 11/25/2019: B Natriuretic Peptide  37.0 11/26/2019: ALT 18 11/27/2019: BUN 55; Creatinine, Ser 3.01; Hemoglobin 9.7; Magnesium 1.9; Platelets 123; Potassium 3.3; Sodium 141   Lipid Panel    Component Value Date/Time   CHOL 126 01/09/2018 0452   TRIG 89 01/09/2018 0452   HDL 34 (L) 01/09/2018 0452   CHOLHDL 3.7 01/09/2018 0452   VLDL 18 01/09/2018 0452   LDLCALC 74 01/09/2018 0452    Additional studies/ records that were reviewed today include:   Echocardiogram: 05/2018 Study Conclusions  - Left ventricle: The cavity size was normal. Wall thickness was   increased in a pattern of mild LVH. Systolic function was normal.   The estimated ejection fraction was in the range of 55% to 60%.   Wall motion was normal; there were no regional wall motion   abnormalities. Features are consistent with a pseudonormal left   ventricular filling pattern, with concomitant abnormal relaxation   and increased filling pressure (grade 2 diastolic dysfunction). - Aortic valve: Mildly calcified annulus. Mildly thickened, mildly   calcified leaflets. - Mitral valve: Mildly calcified annulus. Moderately thickened,   moderately calcified leaflets .   Assessment:    No diagnosis found.   Plan:   In order of problems listed above:  1. ***    Medication Adjustments/Labs and Tests Ordered: Current medicines are reviewed at length with the patient today.  Concerns regarding medicines are outlined above.  Medication changes, Labs and Tests ordered today are listed in the Patient Instructions below. There are no Patient Instructions on file for this visit.   Signed, Erma Heritage, PA-C  12/27/2019 10:11 AM    [facility] [address] [place], [place] [address] Phone: [phone] Fax: [phone]

## 2021-05-18 ENCOUNTER — Inpatient Hospital Stay (HOSPITAL_COMMUNITY): Payer: Medicare HMO

## 2021-05-18 ENCOUNTER — Inpatient Hospital Stay (HOSPITAL_COMMUNITY)
Admission: AD | Admit: 2021-05-18 | Discharge: 2021-05-30 | DRG: 286 | Disposition: A | Discharge status: Hospice | Payer: Medicare HMO | Source: Other Acute Inpatient Hospital | Attending: Cardiovascular Disease | Admitting: Cardiovascular Disease

## 2021-05-18 DIAGNOSIS — I5082 Biventricular heart failure: Secondary | ICD-10-CM | POA: Diagnosis present

## 2021-05-18 DIAGNOSIS — I493 Ventricular premature depolarization: Secondary | ICD-10-CM

## 2021-05-18 DIAGNOSIS — Z66 Do not resuscitate: Secondary | ICD-10-CM | POA: Diagnosis present

## 2021-05-18 DIAGNOSIS — R627 Adult failure to thrive: Secondary | ICD-10-CM | POA: Diagnosis not present

## 2021-05-18 DIAGNOSIS — R0602 Shortness of breath: Secondary | ICD-10-CM

## 2021-05-18 DIAGNOSIS — R778 Other specified abnormalities of plasma proteins: Secondary | ICD-10-CM | POA: Diagnosis not present

## 2021-05-18 DIAGNOSIS — N184 Chronic kidney disease, stage 4 (severe): Secondary | ICD-10-CM

## 2021-05-18 DIAGNOSIS — I248 Other forms of acute ischemic heart disease: Secondary | ICD-10-CM | POA: Diagnosis present

## 2021-05-18 DIAGNOSIS — D631 Anemia in chronic kidney disease: Secondary | ICD-10-CM | POA: Diagnosis present

## 2021-05-18 DIAGNOSIS — M7989 Other specified soft tissue disorders: Secondary | ICD-10-CM | POA: Diagnosis not present

## 2021-05-18 DIAGNOSIS — I219 Acute myocardial infarction, unspecified: Secondary | ICD-10-CM | POA: Diagnosis not present

## 2021-05-18 DIAGNOSIS — N189 Chronic kidney disease, unspecified: Secondary | ICD-10-CM | POA: Insufficient documentation

## 2021-05-18 DIAGNOSIS — I255 Ischemic cardiomyopathy: Secondary | ICD-10-CM | POA: Diagnosis present

## 2021-05-18 DIAGNOSIS — E11649 Type 2 diabetes mellitus with hypoglycemia without coma: Secondary | ICD-10-CM | POA: Diagnosis not present

## 2021-05-18 DIAGNOSIS — I462 Cardiac arrest due to underlying cardiac condition: Secondary | ICD-10-CM | POA: Diagnosis not present

## 2021-05-18 DIAGNOSIS — M25511 Pain in right shoulder: Secondary | ICD-10-CM | POA: Diagnosis present

## 2021-05-18 DIAGNOSIS — D638 Anemia in other chronic diseases classified elsewhere: Secondary | ICD-10-CM

## 2021-05-18 DIAGNOSIS — E118 Type 2 diabetes mellitus with unspecified complications: Secondary | ICD-10-CM

## 2021-05-18 DIAGNOSIS — I272 Pulmonary hypertension, unspecified: Secondary | ICD-10-CM | POA: Diagnosis present

## 2021-05-18 DIAGNOSIS — Y95 Nosocomial condition: Secondary | ICD-10-CM | POA: Diagnosis present

## 2021-05-18 DIAGNOSIS — E669 Obesity, unspecified: Secondary | ICD-10-CM | POA: Diagnosis present

## 2021-05-18 DIAGNOSIS — I5041 Acute combined systolic (congestive) and diastolic (congestive) heart failure: Secondary | ICD-10-CM | POA: Diagnosis present

## 2021-05-18 DIAGNOSIS — N320 Bladder-neck obstruction: Secondary | ICD-10-CM | POA: Diagnosis present

## 2021-05-18 DIAGNOSIS — Z515 Encounter for palliative care: Secondary | ICD-10-CM

## 2021-05-18 DIAGNOSIS — I2510 Atherosclerotic heart disease of native coronary artery without angina pectoris: Secondary | ICD-10-CM | POA: Diagnosis not present

## 2021-05-18 DIAGNOSIS — I5031 Acute diastolic (congestive) heart failure: Secondary | ICD-10-CM | POA: Diagnosis not present

## 2021-05-18 DIAGNOSIS — I50811 Acute right heart failure: Secondary | ICD-10-CM | POA: Diagnosis not present

## 2021-05-18 DIAGNOSIS — I5033 Acute on chronic diastolic (congestive) heart failure: Secondary | ICD-10-CM | POA: Diagnosis not present

## 2021-05-18 DIAGNOSIS — I5043 Acute on chronic combined systolic (congestive) and diastolic (congestive) heart failure: Secondary | ICD-10-CM | POA: Diagnosis present

## 2021-05-18 DIAGNOSIS — E1122 Type 2 diabetes mellitus with diabetic chronic kidney disease: Secondary | ICD-10-CM | POA: Diagnosis present

## 2021-05-18 DIAGNOSIS — N186 End stage renal disease: Secondary | ICD-10-CM | POA: Diagnosis present

## 2021-05-18 DIAGNOSIS — R339 Retention of urine, unspecified: Secondary | ICD-10-CM | POA: Diagnosis not present

## 2021-05-18 DIAGNOSIS — Z4659 Encounter for fitting and adjustment of other gastrointestinal appliance and device: Secondary | ICD-10-CM

## 2021-05-18 DIAGNOSIS — Z6828 Body mass index (BMI) 28.0-28.9, adult: Secondary | ICD-10-CM

## 2021-05-18 DIAGNOSIS — E782 Mixed hyperlipidemia: Secondary | ICD-10-CM | POA: Diagnosis not present

## 2021-05-18 DIAGNOSIS — I252 Old myocardial infarction: Secondary | ICD-10-CM

## 2021-05-18 DIAGNOSIS — I739 Peripheral vascular disease, unspecified: Secondary | ICD-10-CM

## 2021-05-18 DIAGNOSIS — Z79891 Long term (current) use of opiate analgesic: Secondary | ICD-10-CM

## 2021-05-18 DIAGNOSIS — N179 Acute kidney failure, unspecified: Secondary | ICD-10-CM | POA: Diagnosis not present

## 2021-05-18 DIAGNOSIS — I10 Essential (primary) hypertension: Secondary | ICD-10-CM | POA: Diagnosis present

## 2021-05-18 DIAGNOSIS — I440 Atrioventricular block, first degree: Secondary | ICD-10-CM | POA: Diagnosis present

## 2021-05-18 DIAGNOSIS — I50810 Right heart failure, unspecified: Secondary | ICD-10-CM | POA: Diagnosis not present

## 2021-05-18 DIAGNOSIS — Z79899 Other long term (current) drug therapy: Secondary | ICD-10-CM

## 2021-05-18 DIAGNOSIS — Z993 Dependence on wheelchair: Secondary | ICD-10-CM

## 2021-05-18 DIAGNOSIS — Z20822 Contact with and (suspected) exposure to covid-19: Secondary | ICD-10-CM | POA: Diagnosis present

## 2021-05-18 DIAGNOSIS — R578 Other shock: Secondary | ICD-10-CM | POA: Diagnosis not present

## 2021-05-18 DIAGNOSIS — R531 Weakness: Secondary | ICD-10-CM | POA: Diagnosis not present

## 2021-05-18 DIAGNOSIS — J9601 Acute respiratory failure with hypoxia: Secondary | ICD-10-CM | POA: Diagnosis not present

## 2021-05-18 DIAGNOSIS — M79603 Pain in arm, unspecified: Secondary | ICD-10-CM

## 2021-05-18 DIAGNOSIS — I472 Ventricular tachycardia: Secondary | ICD-10-CM | POA: Diagnosis not present

## 2021-05-18 DIAGNOSIS — Z7189 Other specified counseling: Secondary | ICD-10-CM | POA: Diagnosis not present

## 2021-05-18 DIAGNOSIS — E785 Hyperlipidemia, unspecified: Secondary | ICD-10-CM | POA: Diagnosis present

## 2021-05-18 DIAGNOSIS — E1169 Type 2 diabetes mellitus with other specified complication: Secondary | ICD-10-CM | POA: Diagnosis not present

## 2021-05-18 DIAGNOSIS — M1990 Unspecified osteoarthritis, unspecified site: Secondary | ICD-10-CM | POA: Diagnosis present

## 2021-05-18 DIAGNOSIS — Z7982 Long term (current) use of aspirin: Secondary | ICD-10-CM

## 2021-05-18 DIAGNOSIS — Z978 Presence of other specified devices: Secondary | ICD-10-CM

## 2021-05-18 DIAGNOSIS — E1151 Type 2 diabetes mellitus with diabetic peripheral angiopathy without gangrene: Secondary | ICD-10-CM | POA: Diagnosis present

## 2021-05-18 DIAGNOSIS — E039 Hypothyroidism, unspecified: Secondary | ICD-10-CM | POA: Diagnosis present

## 2021-05-18 DIAGNOSIS — Z794 Long term (current) use of insulin: Secondary | ICD-10-CM

## 2021-05-18 DIAGNOSIS — I25810 Atherosclerosis of coronary artery bypass graft(s) without angina pectoris: Secondary | ICD-10-CM

## 2021-05-18 DIAGNOSIS — E1165 Type 2 diabetes mellitus with hyperglycemia: Secondary | ICD-10-CM | POA: Diagnosis present

## 2021-05-18 DIAGNOSIS — Z79890 Hormone replacement therapy (postmenopausal): Secondary | ICD-10-CM

## 2021-05-18 DIAGNOSIS — Z89512 Acquired absence of left leg below knee: Secondary | ICD-10-CM

## 2021-05-18 DIAGNOSIS — I469 Cardiac arrest, cause unspecified: Secondary | ICD-10-CM | POA: Diagnosis not present

## 2021-05-18 DIAGNOSIS — I509 Heart failure, unspecified: Secondary | ICD-10-CM

## 2021-05-18 DIAGNOSIS — Z951 Presence of aortocoronary bypass graft: Secondary | ICD-10-CM

## 2021-05-18 DIAGNOSIS — J189 Pneumonia, unspecified organism: Secondary | ICD-10-CM | POA: Diagnosis not present

## 2021-05-18 DIAGNOSIS — I132 Hypertensive heart and chronic kidney disease with heart failure and with stage 5 chronic kidney disease, or end stage renal disease: Secondary | ICD-10-CM | POA: Diagnosis present

## 2021-05-18 DIAGNOSIS — Z8249 Family history of ischemic heart disease and other diseases of the circulatory system: Secondary | ICD-10-CM

## 2021-05-18 DIAGNOSIS — R001 Bradycardia, unspecified: Secondary | ICD-10-CM | POA: Diagnosis not present

## 2021-05-18 DIAGNOSIS — Z01818 Encounter for other preprocedural examination: Secondary | ICD-10-CM

## 2021-05-18 LAB — MRSA PCR SCREENING: MRSA by PCR: NEGATIVE

## 2021-05-18 LAB — GLUCOSE, CAPILLARY
Glucose-Capillary: 125 mg/dL — ABNORMAL HIGH (ref 70–99)
Glucose-Capillary: 100 mg/dL — ABNORMAL HIGH (ref 70–99)

## 2021-05-18 LAB — HEPARIN LEVEL (UNFRACTIONATED): Heparin Unfractionated: 0.21 IU/mL — ABNORMAL LOW (ref 0.30–0.70)

## 2021-05-18 LAB — MAGNESIUM: Magnesium: 2 mg/dL (ref 1.7–2.4)

## 2021-05-18 LAB — SARS CORONAVIRUS 2 (TAT 6-24 HRS): SARS Coronavirus 2: NEGATIVE

## 2021-05-18 MED ORDER — NITROGLYCERIN 0.4 MG SL SUBL: 0.4000 mg | SUBLINGUAL_TABLET | SUBLINGUAL | Status: DC | PRN

## 2021-05-18 MED ORDER — ATORVASTATIN CALCIUM 40 MG PO TABS
40.0000 mg | ORAL_TABLET | Freq: Every day | ORAL | Status: DC
  Administered 2021-05-18 – 2021-05-21 (×4): 40 mg via ORAL
  Filled 2021-05-18 (×4): qty 1

## 2021-05-18 MED ORDER — ASPIRIN 81 MG PO CHEW
81.0000 mg | CHEWABLE_TABLET | Freq: Every day | ORAL | Status: DC
  Administered 2021-05-18 – 2021-05-22 (×5): 81 mg via ORAL
  Filled 2021-05-18 (×5): qty 1

## 2021-05-18 MED ORDER — GABAPENTIN 300 MG PO CAPS
300.0000 mg | ORAL_CAPSULE | Freq: Every day | ORAL | Status: DC
  Administered 2021-05-18: 300 mg via ORAL
  Administered 2021-05-19: 600 mg via ORAL
  Administered 2021-05-20 – 2021-05-21 (×2): 300 mg via ORAL
  Filled 2021-05-18 (×5): qty 1

## 2021-05-18 MED ORDER — GABAPENTIN 300 MG PO CAPS
600.0000 mg | ORAL_CAPSULE | Freq: Two times a day (BID) | ORAL | Status: DC
  Administered 2021-05-18 – 2021-05-21 (×6): 600 mg via ORAL
  Filled 2021-05-18 (×6): qty 2

## 2021-05-18 MED ORDER — INSULIN ASPART 100 UNIT/ML IJ SOLN
0.0000 [IU] | Freq: Three times a day (TID) | INTRAMUSCULAR | Status: DC
  Administered 2021-05-18: 2 [IU] via SUBCUTANEOUS
  Administered 2021-05-19: 5 [IU] via SUBCUTANEOUS
  Administered 2021-05-19 – 2021-05-20 (×2): 3 [IU] via SUBCUTANEOUS
  Administered 2021-05-20: 5 [IU] via SUBCUTANEOUS
  Administered 2021-05-20: 3 [IU] via SUBCUTANEOUS
  Administered 2021-05-21 (×2): 8 [IU] via SUBCUTANEOUS
  Administered 2021-05-21: 5 [IU] via SUBCUTANEOUS
  Administered 2021-05-22: 11 [IU] via SUBCUTANEOUS

## 2021-05-18 MED ORDER — PANTOPRAZOLE SODIUM 40 MG PO TBEC
40.0000 mg | DELAYED_RELEASE_TABLET | Freq: Every day | ORAL | Status: DC
  Administered 2021-05-18 – 2021-05-21 (×4): 40 mg via ORAL
  Filled 2021-05-18 (×4): qty 1

## 2021-05-18 MED ORDER — ONDANSETRON HCL 4 MG/2ML IJ SOLN: 4.0000 mg | Freq: Four times a day (QID) | INTRAMUSCULAR | Status: DC | PRN

## 2021-05-18 MED ORDER — FUROSEMIDE 10 MG/ML IJ SOLN
60.0000 mg | Freq: Two times a day (BID) | INTRAMUSCULAR | Status: DC
  Administered 2021-05-18 – 2021-05-19 (×2): 60 mg via INTRAVENOUS
  Filled 2021-05-18 (×2): qty 6

## 2021-05-18 MED ORDER — HEPARIN (PORCINE) 25000 UT/250ML-% IV SOLN
1250.0000 [IU]/h | INTRAVENOUS | Status: DC
  Administered 2021-05-18: 950 [IU]/h via INTRAVENOUS
  Administered 2021-05-19: 1100 [IU]/h via INTRAVENOUS
  Administered 2021-05-20: 1250 [IU]/h via INTRAVENOUS
  Filled 2021-05-18 (×4): qty 250

## 2021-05-18 MED ORDER — ACETAMINOPHEN 325 MG PO TABS
650.0000 mg | ORAL_TABLET | ORAL | Status: DC | PRN
  Administered 2021-05-18 – 2021-05-21 (×4): 650 mg via ORAL
  Filled 2021-05-18 (×4): qty 2

## 2021-05-18 MED ORDER — HEPARIN BOLUS VIA INFUSION
1000.0000 [IU] | Freq: Once | INTRAVENOUS | Status: AC
  Administered 2021-05-18: 1000 [IU] via INTRAVENOUS
  Filled 2021-05-18: qty 1000

## 2021-05-18 MED ORDER — TRAZODONE HCL 50 MG PO TABS
50.0000 mg | ORAL_TABLET | Freq: Every evening | ORAL | Status: DC | PRN
  Administered 2021-05-21 – 2021-05-22 (×2): 50 mg via ORAL
  Filled 2021-05-18 (×2): qty 1

## 2021-05-18 MED ORDER — METOPROLOL TARTRATE 12.5 MG HALF TABLET
12.5000 mg | ORAL_TABLET | Freq: Two times a day (BID) | ORAL | Status: DC
  Administered 2021-05-18 – 2021-05-21 (×7): 12.5 mg via ORAL
  Filled 2021-05-18 (×8): qty 1

## 2021-05-18 MED ORDER — LEVOTHYROXINE SODIUM 50 MCG PO TABS
50.0000 ug | ORAL_TABLET | Freq: Every day | ORAL | Status: DC
  Administered 2021-05-19 – 2021-05-22 (×4): 50 ug via ORAL
  Filled 2021-05-18 (×4): qty 1

## 2021-05-18 NOTE — Plan of Care (Signed)
  Problem: Education: Goal: Knowledge of General Education information will improve Description: Including pain rating scale, medication(s)/side effects and non-pharmacologic comfort measures Outcome: Progressing   Problem: Clinical Measurements: Goal: Will remain free from infection Outcome: Progressing   Problem: Safety: Goal: Ability to remain free from injury will improve Outcome: Progressing   

## 2021-05-18 NOTE — Plan of Care (Signed)
  Problem: Education: Goal: Knowledge of General Education information will improve Description: Including pain rating scale, medication(s)/side effects and non-pharmacologic comfort measures Outcome: Progressing   Problem: Health Behavior/Discharge Planning: Goal: Ability to manage health-related needs will improve Outcome: Progressing   Problem: Clinical Measurements: Goal: Ability to maintain clinical measurements within normal limits will improve Outcome: Progressing   Problem: Clinical Measurements: Goal: Diagnostic test results will improve Outcome: Progressing   Problem: Clinical Measurements: Goal: Respiratory complications will improve Outcome: Progressing   Problem: Nutrition: Goal: Adequate nutrition will be maintained Outcome: Progressing   Problem: Pain Managment: Goal: General experience of comfort will improve Outcome: Progressing   Problem: Skin Integrity: Goal: Risk for impaired skin integrity will decrease Outcome: Progressing   Problem: Education: Goal: Ability to demonstrate management of disease process will improve Outcome: Progressing   Problem: Education: Goal: Ability to verbalize understanding of medication therapies will improve Outcome: Progressing

## 2021-05-18 NOTE — Progress Notes (Addendum)
ANTICOAGULATION CONSULT NOTE - Initial Consult  Pharmacy Consult for heparin  Indication: chest pain/ACS  No Known Allergies  Patient Measurements: Height: '5\' 4"'$  (162.6 cm) Weight: 84.6 kg (186 lb 8.2 oz) IBW/kg (Calculated) : 59.2 HEPARIN DW (KG): 77.2   Vital Signs: Temp: 97.8 F (36.6 C) (06/04 1339) Temp Source: Oral (06/04 1339) BP: 152/75 (06/04 1339) Pulse Rate: 85 (06/04 1339)  Labs: No results for input(s): HGB, HCT, PLT, APTT, LABPROT, INR, HEPARINUNFRC, HEPRLOWMOCWT, CREATININE, CKTOTAL, CKMB, TROPONINIHS in the last 72 hours.  CrCl cannot be calculated (Patient's most recent lab result is older than the maximum 21 days allowed.).   Medical History: Past Medical History:  Diagnosis Date  . Anemia   . Arthritis   . Carotid artery disease ([place])   . CHF (congestive heart failure) ([place])   . CKD (chronic kidney disease), stage IV ([place])   . Coronary artery disease 2010   Status post coronary artery bypass grafting  . Diabetes mellitus    Type II  . Dyspnea   . Dysrhythmia   . Foot ulcer ([place])   . History of blood transfusion 05/2018  . Hyperlipidemia   . Hypertension   . Hypothyroidism   . Myocardial infarction ([place]) 2010  . PAD (peripheral artery disease) ([place])   . Postoperative atrial fibrillation ([place])   . Thrombocytopenia ([place])     Medications:  Medications Prior to Admission  Medication Sig Dispense Refill Last Dose  . amLODipine (NORVASC) 5 MG tablet Take 5 mg by mouth daily.     Marland Kitchen aspirin 81 MG tablet Take 81 mg by mouth at bedtime.      Marland Kitchen atorvastatin (LIPITOR) 40 MG tablet Take 40 mg by mouth at bedtime.   3   . furosemide (LASIX) 40 MG tablet Take 40 mg by mouth daily.      Marland Kitchen gabapentin (NEURONTIN) 300 MG capsule Take 300-600 mg by mouth See admin instructions. '600mg'$  in the morning, '300mg'$  midday, and takes '600mg'$  at bedime     . insulin aspart (NOVOLOG) 100 UNIT/ML FlexPen Inject 1-12 Units into the skin 3 (three) times daily with meals. *As  directed per sliding scale up to 30 units total daily  Less than 150=0  151-200=3  200-300=5  300-400=8  400-500=10  500-600= 12 units and drink lots of water     . insulin glargine (LANTUS) 100 UNIT/ML injection Inject 0.1 mLs (10 Units total) into the skin at bedtime. 10 mL 11   . levothyroxine (SYNTHROID, LEVOTHROID) 50 MCG tablet Take 50 mcg by mouth daily before breakfast.      . Omega-3 Fatty Acids (FISH OIL) 1200 MG CAPS Take 1 capsule by mouth daily.     . pantoprazole (PROTONIX) 40 MG tablet Take 40 mg by mouth daily.     . potassium chloride (K-DUR,KLOR-CON) 10 MEQ tablet Take 10 mEq by mouth daily.     . traZODone (DESYREL) 50 MG tablet Take 1 tablet (50 mg total) by mouth at bedtime as needed for sleep. 30 tablet 0    Scheduled:  . aspirin  81 mg Oral Daily  . atorvastatin  40 mg Oral QHS  . furosemide  60 mg Intravenous BID  . gabapentin  300-600 mg Oral See admin instructions  . insulin aspart  0-15 Units Subcutaneous TID WC  . [START ON 05/19/2021] levothyroxine  50 mcg Oral QAC breakfast  . metoprolol tartrate  12.5 mg Oral BID  . pantoprazole  40 mg Oral Daily  Assessment: 86 yo male here with SOB and elevated troponin (noted with history of CAD and CABG). Pharmacy consulted to dose heparin. No anticoagulants noted PTA. -heparin was started at the prior facility at 960 units/hr   Goal of Therapy:  Heparin level 0.3-0.7 units/ml Monitor platelets by anticoagulation protocol: Yes   Plan:  -Continue heparin at 960 units/hr -Heparin level later tongiht  Hildred Laser, PharmD Clinical Pharmacist **Pharmacist phone directory can now be found on [email or web address] ([email or web address]).  Listed under [place].  Addendum -heparin level= 0.21  Plan -heparin bolus 1000 units x1 and increase to 1100 units/hr -Heparin level in 8 hours and daily wth CBC daily  Hildred Laser, PharmD Clinical Pharmacist **Pharmacist phone directory can now be found on [email or web address] ([email or web address]).  Listed  under [place].

## 2021-05-18 NOTE — H&P (Addendum)
Cardiology Admission History and Physical:   Patient ID: [name] MRN: [id number]; DOB: [birth year]   Admission date: 05/18/2021  PCP:  No primary care provider on file.   [facility] Providers Cardiologist: Previously followed by Dr. Bronson Ing in 2017  Chief Complaint: Dyspnea  Patient Profile:   [name] is a 86 y.o. male with past medical history of CAD (s/p CABG in 2010 with LIMA-LAD, SVG-D1, SVG-LCx and SVG-PDA), HFpEF, peripheral arterial disease (s/p left femoropopliteal bypass in 05/2018 and now left BKA), HTN, HLD, Type 2 DM, carotid artery disease, and Stage 4 CKD who is being seen today for the evaluation of dyspnea and troponin elevation.  History of Present Illness:   [name] presented to [facility] ED on 05/17/2021 for evaluation of worsening dyspnea. In talking with the patient today, he reports he has been experiencing shortness of breath for the past month. Is wheelchair bound at baseline due to his prior amputation but reports dyspnea with transfers. Denies any associated chest pain or palpitations. Reports edema along his RLE. Also has baseline 2-pillow orthopnea. Was on Lasix '40mg'$  daily prior to admission.   Upon arrival to the ED, he was found to be hypoxic and oxygen saturations were in the 80's upon arrival. HR was initially in the 40's and he was in ventricular bigeminy (suspect bradycardia was due to PVC's not being counted). EKG showed no ischemic changes per the report. Tracing sent over showed NSR with ventricular bigeminy.  Initial labs showed Na+ 138, K+ 4.6, creatinine 3.24 (creatinine at 2.32 in 07/2020), glucose 166, WBC 9.9, Hgb 11.6, platelets 202, Mg 2.0. Pro BNP elevated to 10,389. Initial Hs Troponin of 275 with repeat of 937. CXR showed interstitial edema with no evidence of consolidation.   He did receive IV Lasix '60mg'$  along with ASA '324mg'$  and NTG paste.    Past Medical History:  Diagnosis Date  . Anemia   . Arthritis    . Carotid artery disease ([place])   . CHF (congestive heart failure) ([place])   . CKD (chronic kidney disease), stage IV ([place])   . Coronary artery disease 2010   Status post coronary artery bypass grafting  . Diabetes mellitus    Type II  . Dyspnea   . Dysrhythmia   . Foot ulcer ([place])   . History of blood transfusion 05/2018  . Hyperlipidemia   . Hypertension   . Hypothyroidism   . Myocardial infarction ([place]) 2010  . PAD (peripheral artery disease) ([place])   . Postoperative atrial fibrillation ([place])   . Thrombocytopenia ([place])     Past Surgical History:  Procedure Laterality Date  . AMPUTATION Left 06/16/2018   Procedure: LEFT TRANSMETATARSAL AMPUTATION;  Surgeon: [name], MD;  Location: [place];  Service: Orthopedics;  Laterality: Left;  . AMPUTATION Left 07/16/2018   Procedure: LEFT BELOW KNEE AMPUTATION;  Surgeon: [name], MD;  Location: [place];  Service: Orthopedics;  Laterality: Left;  . AORTOGRAM Left 06/03/2018   Procedure: ABDOMINAL AORTOGRAM WITH CARBON DIOXIDE LEFT LOWER EXTREMITY RUNOFF;  Surgeon: Conrad Mattawan, MD;  Location: [place];  Service: Vascular;  Laterality: Left;  . BELOW KNEE LEG AMPUTATION Left 07/16/2018  . carotid insuff    . CORONARY ARTERY BYPASS GRAFT  2010   LIMA to LAD, SVG to diagonal, SVG to circumflex, marginal, SVG to posterior descending branch.  . FEMORAL-POPLITEAL BYPASS GRAFT Left 06/10/2018   Procedure: LEFT COMMON FEMORAL TO BELOW KNEE POPLITEAL BYPASS GRAFT WITH PROPATEN;  Surgeon: Conrad Ventura, MD;  Location: [facility];  Service: Vascular;  Laterality: Left;     Medications Prior to Admission: Prior to Admission medications   Medication Sig Start Date End Date Taking? Authorizing Provider  amLODipine (NORVASC) 5 MG tablet Take 5 mg by mouth daily.    [provider]  aspirin 81 MG tablet Take 81 mg by mouth at bedtime.     [provider]  atorvastatin (LIPITOR) 40 MG tablet Take 40 mg by mouth at bedtime.  01/30/16    [provider]  furosemide (LASIX) 40 MG tablet Take 40 mg by mouth daily.     [provider]  gabapentin (NEURONTIN) 300 MG capsule Take 300-600 mg by mouth See admin instructions. '600mg'$  in the morning, '300mg'$  midday, and takes '600mg'$  at bedime 11/04/19   [provider]  insulin aspart (NOVOLOG) 100 UNIT/ML FlexPen Inject 1-12 Units into the skin 3 (three) times daily with meals. *As directed per sliding scale up to 30 units total daily  Less than 150=0  151-200=3  200-300=5  300-400=8  400-500=10  500-600= 12 units and drink lots of water 11/16/19   [provider]  insulin glargine (LANTUS) 100 UNIT/ML injection Inject 0.1 mLs (10 Units total) into the skin at bedtime. 11/28/19   Johnson, Clanford L, MD  levothyroxine (SYNTHROID, LEVOTHROID) 50 MCG tablet Take 50 mcg by mouth daily before breakfast.     [provider]  Omega-3 Fatty Acids (FISH OIL) 1200 MG CAPS Take 1 capsule by mouth daily.    [provider]  pantoprazole (PROTONIX) 40 MG tablet Take 40 mg by mouth daily. 11/05/19   [provider]  potassium chloride (K-DUR,KLOR-CON) 10 MEQ tablet Take 10 mEq by mouth daily. 07/10/18   [provider]  traZODone (DESYREL) 50 MG tablet Take 1 tablet (50 mg total) by mouth at bedtime as needed for sleep. 11/28/19   Murlean Iba, MD     Allergies:   No Known Allergies  Social History:   Social History   Socioeconomic History  . Marital status: Widowed    Spouse name: Not on file  . Number of children: Not on file  . Years of education: Not on file  . Highest education level: Not on file  Occupational History  . Not on file  Tobacco Use  . Smoking status: Never Smoker  . Smokeless tobacco: Never Used  Vaping Use  . Vaping Use: Never used  Substance and Sexual Activity  . Alcohol use: Not Currently    Alcohol/week: 0.0 standard drinks  . Drug use: No  . Sexual activity: Not on file  Other Topics  Concern  . Not on file  Social History Narrative   Lives with granddaughter   Social Determinants of Health   Financial Resource Strain: Not on file  Food Insecurity: Not on file  Transportation Needs: Not on file  Physical Activity: Not on file  Stress: Not on file  Social Connections: Not on file  Intimate Partner Violence: Not on file    Family History:  The patient's family history includes Heart attack in his brother and father.    ROS:  Please see the history of present illness.   All other ROS reviewed and negative.     Physical Exam/Data:   Vitals:   05/18/21 1339  BP: (!) 152/75  Pulse: 85  Resp: (!) 22  Temp: 97.8 F (36.6 C)  TempSrc: Oral  SpO2: 95%  Weight:  84.6 kg  Height: '5\' 4"'$  (1.626 m)   No intake or output data in the 24 hours ending 05/18/21 1513 Last 3 Weights 05/18/2021 11/25/2019 12/23/2018  Weight (lbs) 186 lb 8.2 oz 180 lb 171 lb  Weight (kg) 84.6 kg 81.647 kg 77.565 kg     Body mass index is 32.01 kg/m.  General:  Pleasant elderly male appearing in no acute distress. HEENT: normal Lymph: no adenopathy Neck: JVD at 10 cm.  Endocrine:  No thryomegaly Vascular: No carotid bruits; FA pulses 2+ bilaterally without bruits  Cardiac:  normal S1, S2; RRR with frequent ectopic beats.  Lungs: Rales throughout lower lung fields bilaterally Abd: soft, nontender, no hepatomegaly  Ext: 2+ pitting edema along RLE Musculoskeletal:  No deformities. Left BKA Skin: warm and dry  Neuro:  CNs 2-12 intact, no focal abnormalities noted Psych:  Normal affect   Relevant CV Studies:  Echocardiogram: 05/2018 Study Conclusions   - Left ventricle: The cavity size was normal. Wall thickness was    increased in a pattern of mild LVH. Systolic function was normal.    The estimated ejection fraction was in the range of 55% to 60%.    Wall motion was normal; there were no regional wall motion    abnormalities. Features are consistent with a pseudonormal left     ventricular filling pattern, with concomitant abnormal relaxation    and increased filling pressure (grade 2 diastolic dysfunction).  - Aortic valve: Mildly calcified annulus. Mildly thickened, mildly    calcified leaflets.  - Mitral valve: Mildly calcified annulus. Moderately thickened,    moderately calcified leaflets .    Laboratory Data:  High Sensitivity Troponin:  No results for input(s): TROPONINIHS in the last 720 hours.    ChemistryNo results for input(s): NA, K, CL, CO2, GLUCOSE, BUN, CREATININE, CALCIUM, GFRNONAA, GFRAA, ANIONGAP in the last 168 hours.  No results for input(s): PROT, ALBUMIN, AST, ALT, ALKPHOS, BILITOT in the last 168 hours. HematologyNo results for input(s): WBC, RBC, HGB, HCT, MCV, MCH, MCHC, RDW, PLT in the last 168 hours. BNPNo results for input(s): BNP, PROBNP in the last 168 hours.  DDimer No results for input(s): DDIMER in the last 168 hours.   Radiology/Studies:  No results found.   Assessment and Plan:   1. Acute CHF Exacerbation (Echocardiogram pending to determine EF).  - Presents with a 90-monthhistory of worsening dyspnea on exertion, orthopnea and pitting edema. [facility] Will order a repeat echocardiogram.  - He received IV Lasix '60mg'$  x1 with a good response. Will start IV Lasix '60mg'$  BID. Follow I&O's along with daily weights. Repeat BMET in AM.   2. PVC's - Frequent PVC's on telemetry along with episodes of ventricular bigeminy. He did have a short run of NSVT during transport and received IV Amiodarone. He is asymptomatic at this time. Reviewed with Dr. CSallyanne Kusterand will start Lopressor 12.'5mg'$  BID. Can titrate as HR and BP allow. Keep K+ ~ 4.0 and Mg ~ 2.0.  3. CAD/Elevated Troponin - He is s/p CABG in 2010 with LIMA-LAD, SVG-D1, SVG-LCx and SVG-PDA. Hs Troponin were elevated to 275 and 937 at [facility]. Will recheck with AM labs.  - He denies any recent chest pain. Enzyme elevation  possibly due to demand ischemia in the setting of his CHF exacerbation. Echo pending to assess LV function and wall motion. Would not be a cath candidate this time given his underlying CKD and high-risk  for contrast induced nephropathy. Remains on IV Heparin, ASA and statin therapy. Starting BB as outlined above.    4. PAD - He is s/p left BKA. Wheel-chair bound at baseline.   5. HLD - Will recheck FLP. Continue PTA Atorvastatin '40mg'$  daily.   6. IDDM - Will recheck Hgb A1c. SSI during admission.   7. Stage 4 CKD - Creatinine was elevated to 3.24 at outside hospital, previously at 2.32 in 07/2020. Repeat BMET in AM. Pending reassessment, may need Nephrology consult this admission.   8. Right Shoulder Pain - Reports MSK shoulder pain for the past week. Reproducible with movement. Will order a plain film. Recommended he follow-up with Orthopedics as an outpatient if symptoms do not improve.    Risk Assessment/Risk Scores:      New York Heart Association (NYHA) Functional Class NYHA Class III   Severity of Illness: The appropriate patient status for this patient is INPATIENT. Inpatient status is judged to be reasonable and necessary in order to provide the required intensity of service to ensure the patient's safety. The patient's presenting symptoms, physical exam findings, and initial radiographic and laboratory data in the context of their chronic comorbidities is felt to place them at high risk for further clinical deterioration. Furthermore, it is not anticipated that the patient will be medically stable for discharge from the hospital within 2 midnights of admission. The following factors support the patient status of inpatient.   " The patient's presenting symptoms include dyspnea on exertion, edema and orthopnea. " The worrisome physical exam findings include elevated JVD, rales and edema. " The initial radiographic and laboratory data are worrisome because of elevated troponin and  elevated BNP. " The chronic co-morbidities include CAD, Stage 4 CKD, HTN, HLD and Type 2 DM.   * I certify that at the point of admission it is my clinical judgment that the patient will require inpatient hospital care spanning beyond 2 midnights from the point of admission due to high intensity of service, high risk for further deterioration and high frequency of surveillance required.*    For questions or updates, please contact [place] Please consult [email or web address] for contact info under     Signed, Erma Heritage, PA-C  05/18/2021 3:13 PM    I have seen and examined the patient along with Erma Heritage, PA-C .  I have reviewed the chart, notes and new data.  I agree with PA/NP's note.  Key new complaints: Edema and shortness of breath had a gradual, rather than abrupt onset.  He has not had angina. Key examination changes: Grossly volume overloaded, dullness to percussion in both lung bases, hard to see JVD but appear to be at least 8-10 cm, but has 2-3+ pitting edema pretibially in the right lower extremity, status post left BKA Key new findings / data: Labs significant for markedly elevated creatinine, slightly higher than baseline.  Increase in troponin is mild and within the range of subendocardial ischemia seen with heart failure, rather than acute atherothrombotic acute coronary event.  ECG without acute ischemic changes.  Frequent PVCs are seen on the monitor, brief nonsustained VT seen on the telemetry strips during his transfer via EMS, for which she received a bolus of intravenous amiodarone  PLAN: I think his presentation is more consistent with acute heart failure, rather than an acute coronary syndrome. Nevertheless, he is about 12 years since bypass surgery and has numerous coronary risk factors, may need evaluation for new coronary insufficiency, if there is evidence  of LV wall motion abnormalities or reduced ejection fraction on echocardiogram. The threshold  for performing invasive coronary angiography with contrast is high due to his advanced kidney disease. For now treat with diuretics and monitor renal function.  He is already on heparin which we will continue until we have more data. At this point although he is having frequent ventricular ectopy, I do not think amiodarone is justified.  We will start very low-dose beta-blocker.  Meticulously manage electrolyte levels with diuresis.  Sanda Klein, MD, [place] [phone] 05/18/2021, 3:41 PM

## 2021-05-18 NOTE — Plan of Care (Signed)

## 2021-05-19 ENCOUNTER — Inpatient Hospital Stay (HOSPITAL_COMMUNITY): Payer: Medicare HMO

## 2021-05-19 DIAGNOSIS — N189 Chronic kidney disease, unspecified: Secondary | ICD-10-CM

## 2021-05-19 DIAGNOSIS — I5031 Acute diastolic (congestive) heart failure: Secondary | ICD-10-CM

## 2021-05-19 DIAGNOSIS — I219 Acute myocardial infarction, unspecified: Secondary | ICD-10-CM

## 2021-05-19 DIAGNOSIS — E11649 Type 2 diabetes mellitus with hypoglycemia without coma: Secondary | ICD-10-CM

## 2021-05-19 LAB — SODIUM, URINE, RANDOM: Sodium, Ur: 95 mmol/L

## 2021-05-19 LAB — URINALYSIS, ROUTINE W REFLEX MICROSCOPIC
Bilirubin Urine: NEGATIVE
Glucose, UA: NEGATIVE mg/dL
Ketones, ur: NEGATIVE mg/dL
Leukocytes,Ua: NEGATIVE
Nitrite: NEGATIVE
Protein, ur: NEGATIVE mg/dL
Specific Gravity, Urine: 1.005 (ref 1.005–1.030)
pH: 5 (ref 5.0–8.0)

## 2021-05-19 LAB — GLUCOSE, CAPILLARY
Glucose-Capillary: 34 mg/dL — CL (ref 70–99)
Glucose-Capillary: 151 mg/dL — ABNORMAL HIGH (ref 70–99)
Glucose-Capillary: 204 mg/dL — ABNORMAL HIGH (ref 70–99)
Glucose-Capillary: 173 mg/dL — ABNORMAL HIGH (ref 70–99)
Glucose-Capillary: 193 mg/dL — ABNORMAL HIGH (ref 70–99)

## 2021-05-19 LAB — ECHOCARDIOGRAM COMPLETE
AR max vel: 1.17 cm2
AV Area VTI: 1.24 cm2
AV Area mean vel: 1.26 cm2
AV Mean grad: 5 mmHg
AV Peak grad: 10.1 mmHg
Ao pk vel: 1.59 m/s
Height: 64 in
S' Lateral: 3.7 cm
Weight: 3174.62 oz

## 2021-05-19 LAB — CBC
HCT: 33.3 % — ABNORMAL LOW (ref 39.0–52.0)
Hemoglobin: 10.2 g/dL — ABNORMAL LOW (ref 13.0–17.0)
MCH: 31 pg (ref 26.0–34.0)
MCHC: 30.6 g/dL (ref 30.0–36.0)
MCV: 101.2 fL — ABNORMAL HIGH (ref 80.0–100.0)
Platelets: 187 10*3/uL (ref 150–400)
RBC: 3.29 MIL/uL — ABNORMAL LOW (ref 4.22–5.81)
RDW: 13.2 % (ref 11.5–15.5)
WBC: 7.8 10*3/uL (ref 4.0–10.5)
nRBC: 0 % (ref 0.0–0.2)

## 2021-05-19 LAB — BASIC METABOLIC PANEL
Anion gap: 9 (ref 5–15)
BUN: 70 mg/dL — ABNORMAL HIGH (ref 8–23)
CO2: 18 mmol/L — ABNORMAL LOW (ref 22–32)
Calcium: 8.3 mg/dL — ABNORMAL LOW (ref 8.9–10.3)
Chloride: 108 mmol/L (ref 98–111)
Creatinine, Ser: 3.89 mg/dL — ABNORMAL HIGH (ref 0.61–1.24)
GFR, Estimated: 15 mL/min — ABNORMAL LOW (ref >60)
Glucose, Bld: 40 mg/dL — CL (ref 70–99)
Potassium: 4.8 mmol/L (ref 3.5–5.1)
Sodium: 135 mmol/L (ref 135–145)

## 2021-05-19 LAB — CREATININE, URINE, RANDOM: Creatinine, Urine: 16.98 mg/dL

## 2021-05-19 LAB — BRAIN NATRIURETIC PEPTIDE: B Natriuretic Peptide: 799.7 pg/mL — ABNORMAL HIGH (ref 0.0–100.0)

## 2021-05-19 LAB — TROPONIN I (HIGH SENSITIVITY): Troponin I (High Sensitivity): 593 ng/L

## 2021-05-19 LAB — LIPID PANEL
Cholesterol: 123 mg/dL (ref 0–200)
HDL: 40 mg/dL — ABNORMAL LOW
LDL Cholesterol: 70 mg/dL (ref 0–99)
Total CHOL/HDL Ratio: 3.1 ratio
Triglycerides: 67 mg/dL
VLDL: 13 mg/dL (ref 0–40)

## 2021-05-19 LAB — HEPARIN LEVEL (UNFRACTIONATED): Heparin Unfractionated: 0.33 IU/mL (ref 0.30–0.70)

## 2021-05-19 MED ORDER — DEXTROSE 50 % IV SOLN
25.0000 g | Freq: Once | INTRAVENOUS | Status: AC
  Administered 2021-05-19: 25 g via INTRAVENOUS
  Filled 2021-05-19: qty 50

## 2021-05-19 MED ORDER — CHLORHEXIDINE GLUCONATE CLOTH 2 % EX PADS
6.0000 | MEDICATED_PAD | Freq: Every day | CUTANEOUS | Status: DC
  Administered 2021-05-19 – 2021-05-30 (×12): 6 via TOPICAL

## 2021-05-19 MED ORDER — FUROSEMIDE 10 MG/ML IJ SOLN
120.0000 mg | Freq: Two times a day (BID) | INTRAVENOUS | Status: DC
  Filled 2021-05-19 (×2): qty 12

## 2021-05-19 MED ORDER — FUROSEMIDE 10 MG/ML IJ SOLN
120.0000 mg | Freq: Three times a day (TID) | INTRAVENOUS | Status: DC
  Administered 2021-05-19 – 2021-05-20 (×3): 120 mg via INTRAVENOUS
  Filled 2021-05-19 (×2): qty 10
  Filled 2021-05-19: qty 12
  Filled 2021-05-19: qty 10
  Filled 2021-05-19: qty 12

## 2021-05-19 MED ORDER — FUROSEMIDE 10 MG/ML IJ SOLN
120.0000 mg | Freq: Three times a day (TID) | INTRAVENOUS | Status: DC
  Filled 2021-05-19 (×2): qty 12

## 2021-05-19 NOTE — Progress Notes (Signed)
Progress Note  Patient Name: [name] Date of Encounter: 05/19/2021  [facility] Cardiologist: Kate Sable, MD (Inactive)   Subjective   Breathing appears to be deteriorating.  Orthopneic and rather sleepy. Some hypoglycemia early AM requiring D50. Mediocre diuresis overnight after furosemide 60 mg IV, similarly mediocre response this morning.  Inpatient Medications    Scheduled Meds: . aspirin  81 mg Oral Daily  . atorvastatin  40 mg Oral QHS  . Chlorhexidine Gluconate Cloth  6 each Topical Daily  . furosemide  60 mg Intravenous BID  . gabapentin  300 mg Oral Q1400  . gabapentin  600 mg Oral BID AC & HS  . insulin aspart  0-15 Units Subcutaneous TID WC  . levothyroxine  50 mcg Oral QAC breakfast  . metoprolol tartrate  12.5 mg Oral BID  . pantoprazole  40 mg Oral Daily   Continuous Infusions: . heparin 1,100 Units/hr (05/18/21 2144)   PRN Meds: acetaminophen, nitroGLYCERIN, ondansetron (ZOFRAN) IV, traZODone   Vital Signs    Vitals:   05/18/21 2112 05/18/21 2321 05/19/21 0300 05/19/21 0700  BP: 133/73 133/71 101/65 128/67  Pulse: 66 (!) 51 (!) 54 (!) 56  Resp:  '18 18 11  '$ Temp:  97.8 F (36.6 C) 97.8 F (36.6 C) 97.8 F (36.6 C)  TempSrc:  Oral Oral Oral  SpO2:  95% 96% (!) 84%  Weight:   90 kg   Height:        Intake/Output Summary (Last 24 hours) at 05/19/2021 1148 Last data filed at 05/19/2021 0300 Gross per 24 hour  Intake 836.33 ml  Output 650 ml  Net 186.33 ml   Last 3 Weights 05/19/2021 05/18/2021 11/25/2019  Weight (lbs) 198 lb 6.6 oz 186 lb 8.2 oz 180 lb  Weight (kg) 90 kg 84.6 kg 81.647 kg      Telemetry    Sinus rhythm with very frequent PVCs- Personally Reviewed  ECG    Sinus rhythm, first-degree AV block, frequent PVCs, old inferior infarction, nonspecific intraventricular conduction delay, diffuse ST segment changes, most prominent in the lateral leads- Personally Reviewed  Physical Exam  Obese, a little sleepy but easily  awoken GEN: No acute distress.   Neck:  10 cm JVD Cardiac: RRR, no murmurs, rubs, or gallops.  Respiratory: Clear to auscultation bilaterally. GI: Soft, nontender, non-distended  MS: 3+ soft pitting dependent edema of the right lower extremity almost to the knee; left BKA Neuro:  Nonfocal  Psych: Normal affect   Labs    High Sensitivity Troponin:   Recent Labs  Lab 05/19/21 0610  TROPONINIHS 593*      Chemistry Recent Labs  Lab 05/19/21 0610  NA 135  K 4.8  CL 108  CO2 18*  GLUCOSE 40*  BUN 70*  CREATININE 3.89*  CALCIUM 8.3*  GFRNONAA 15*  ANIONGAP 9     Hematology Recent Labs  Lab 05/19/21 0610  WBC 7.8  RBC 3.29*  HGB 10.2*  HCT 33.3*  MCV 101.2*  MCH 31.0  MCHC 30.6  RDW 13.2  PLT 187    BNPNo results for input(s): BNP, PROBNP in the last 168 hours.   DDimer No results for input(s): DDIMER in the last 168 hours.   Radiology    DG Shoulder Right Port  Result Date: 05/18/2021 CLINICAL DATA:  RIGHT shoulder pain.  No injury. EXAM: PORTABLE RIGHT SHOULDER COMPARISON:  None. FINDINGS: Osseous alignment appears normal. No fracture line or displaced fracture fragment. No acute-appearing cortical irregularity  or osseous lesion. No significant degenerative change. Soft tissues about the RIGHT shoulder are unremarkable. IMPRESSION: Negative. Electronically Signed   By: Franki Cabot M.D.   On: 05/18/2021 15:50    Cardiac Studies  ECHO 05/19/2021  1. Left ventricular ejection fraction, by estimation, is 50 to 55%. The  left ventricle has low normal function. The left ventricle demonstrates  regional wall motion abnormalities (see scoring diagram/findings for  description). There is mild left  ventricular hypertrophy. Left ventricular diastolic parameters are  indeterminate.  2. Right ventricular systolic function is moderately reduced. The right  ventricular size is moderately enlarged. There is severely elevated  pulmonary artery systolic pressure.  The estimated right ventricular  systolic pressure is 123XX123 mmHg.  3. Left atrial size was mildly dilated.  4. The mitral valve is abnormal. Trivial mitral valve regurgitation.  Severe mitral annular calcification.  5. The aortic valve is tricuspid. There is mild calcification of the  aortic valve. Aortic valve regurgitation is not visualized. Mild to  moderate aortic valve sclerosis/calcification is present, without any  evidence of aortic stenosis. Aortic valve mean  gradient measures 5.0 mmHg.  6. The inferior vena cava is normal in size with <50% respiratory  variability, suggesting right atrial pressure of 8 mmHg.   Echocardiogram: 05/2018 Study Conclusions   - Left ventricle: The cavity size was normal. Wall thickness was  increased in a pattern of mild LVH. Systolic function was normal.  The estimated ejection fraction was in the range of 55% to 60%.  Wall motion was normal; there were no regional wall motion  abnormalities. Features are consistent with a pseudonormal left  ventricular filling pattern, with concomitant abnormal relaxation  and increased filling pressure (grade 2 diastolic dysfunction).  - Aortic valve: Mildly calcified annulus. Mildly thickened, mildly  calcified leaflets.  - Mitral valve: Mildly calcified annulus. Moderately thickened,  moderately calcified leaflets .    Patient Profile     86 y.o. male with past medical history of CAD (s/p CABG in 2010 with LIMA-LAD, SVG-D1, SVG-LCx and SVG-PDA), HFpEF, peripheral arterial disease (s/p left femoropopliteal bypass in 05/2018 and now left BKA), HTN, HLD, Type 2 DM, carotid artery disease, and Stage 4 CKD who is being seen today for the evaluation of dyspnea and troponin elevation.  Assessment & Plan    1. Acute CHF Exacerbation (Echocardiogram pending to determine EF).  - Presents with a 55-monthhistory of worsening dyspnea on exertion, orthopnea and pitting edema. [facility]  - repeat echo EF 50%. Systolic PA pressure 66 mm Hg. On my review of echo, diastolic parameters indicate elevated mean left atrial pressure - He received IV Lasix '60mg'$  x1 with a good response initially, but now with diminishing UO and worsening renal parameters. Will increase diuretic dose  2. PVC's - Frequent PVC's on telemetry along with episodes of ventricular bigeminy. He did have a short run of NSVT during transport and received IV Amiodarone. Started on low dose metoprolol. Keep K and Mg in optimal range.  3. CAD/Elevated Troponin - He is s/p CABG in 2010 with LIMA-LAD, SVG-D1, SVG-LCx and SVG-PDA. Hs Troponin were elevated to 275 and 937 at [facility]. - Troponin lower this AM at 593. More c/w w CHF and demand ischemia than true ACS.  He denies any recent chest pain. Enzyme elevation possibly due to demand ischemia in the setting of his CHF exacerbation. Would not be a cath candidate this time  given his underlying CKD and high-risk for contrast induced nephropathy. Remains on IV Heparin, ASA and statin therapy. Starting BB as outlined above.    4. PAD - He is s/p left BKA. Wheel-chair bound at baseline.   5. HLD -  LDL 70. Continue  Atorvastatin '40mg'$  daily.   6. IDDM - Will recheck Hgb A1c. Poor recent control, A1c 10.2%, but with hypoglycemia this morning, maybe an indication of worsened renal function.  7. Stage 4 CKD - Creatinine was elevated to 3.24 at outside hospital, previously at 2.32 in 07/2020, 2.5-3.0 in 11/2020. Worsening renal function, now creatinine 3.8. Discussed with Dr. Jonnie Finner, will request consultation.   8. Right Shoulder Pain - Reports MSK shoulder pain for the past week. Reproducible with movement. Xray without abnormalities.   For questions or updates, please contact [place] Please consult [email or web address] for contact info under        Signed, Sanda Klein, MD  05/19/2021, 11:48 AM

## 2021-05-19 NOTE — Progress Notes (Addendum)
    Called to assess patient due to worsening respiratory issues. Reviewed with Dr. Sallyanne Kuster as well and his respiratory status has continued to decline throughout the day. Received IV Lasix overnight and creatinine has trended upwards. Nephrology has been consulted and are planning to try high-dose Lasix and if renal function deteriorates, would need Palliative Care consult as he would not be a candidate for HD from their perspective and the patient also reported he would not want HD. Plan to continue with high-dose Lasix. Will order PRN BiPAP given increased work of breathing.   Called and updated the patient's son as well per his request and his daughter's request who was at the bedside.   Signed, Erma Heritage, PA-C 05/19/2021, 2:41 PM Pager: [phone]

## 2021-05-19 NOTE — Progress Notes (Signed)
Patient resting comfortably on 3L [place]. No respiratory distress noted. BIPAP not indicated at this time.

## 2021-05-19 NOTE — Progress Notes (Signed)
Patient awoke from nap slightly altered and disoriented. Unsure of where or why he was in the hospital, the date or time but knew I was his nurse.  Breathing much more labored than this am at 8am. Patient forcing expirations and breathing very shallow, audible louder exp wheezes, and sounds to force the air out.  Patient states cannot stay awake. Having nightmares. Spoke with daughter on the phone and then she called me asking what is wrong with him.  Dr Keene Breath on the way.

## 2021-05-19 NOTE — Progress Notes (Signed)
New Site for heparin  Indication: chest pain/ACS  No Known Allergies  Patient Measurements: Height: '5\' 4"'$  (162.6 cm) Weight: 90 kg (198 lb 6.6 oz) IBW/kg (Calculated) : 59.2 HEPARIN DW (KG): 77.2   Vital Signs: Temp: 97.8 F (36.6 C) (06/05 0700) Temp Source: Oral (06/05 0700) BP: 128/67 (06/05 0700) Pulse Rate: 56 (06/05 0700)  Labs: Recent Labs    05/18/21 1939 05/19/21 0610  HGB  --  10.2*  HCT  --  33.3*  PLT  --  187  HEPARINUNFRC 0.21* 0.33  CREATININE  --  3.89*  TROPONINIHS  --  593*    Estimated Creatinine Clearance: 14.3 mL/min (A) (by C-G formula based on SCr of 3.89 mg/dL (H)).   Medical History: Past Medical History:  Diagnosis Date  . Anemia   . Arthritis   . Carotid artery disease ([place])   . CHF (congestive heart failure) ([place])   . CKD (chronic kidney disease), stage IV ([place])   . Coronary artery disease 2010   Status post coronary artery bypass grafting  . Diabetes mellitus    Type II  . Dyspnea   . Dysrhythmia   . Foot ulcer ([place])   . History of blood transfusion 05/2018  . Hyperlipidemia   . Hypertension   . Hypothyroidism   . Myocardial infarction ([place]) 2010  . PAD (peripheral artery disease) ([place])   . Postoperative atrial fibrillation ([place])   . Thrombocytopenia ([place])     Medications:  Medications Prior to Admission  Medication Sig Dispense Refill Last Dose  . amLODipine (NORVASC) 5 MG tablet Take 5 mg by mouth daily.   Past Week at Unknown time  . aspirin 81 MG tablet Take 81 mg by mouth at bedtime.    Past Week at Unknown time  . atorvastatin (LIPITOR) 40 MG tablet Take 40 mg by mouth at bedtime.   3 Past Week at Unknown time  . furosemide (LASIX) 40 MG tablet Take 40 mg by mouth daily as needed (swelling (blood pressure)).   Past Week at Unknown time  . insulin glargine (LANTUS) 100 UNIT/ML injection Inject 0.1 mLs (10 Units total) into the skin at bedtime. (Patient taking differently:  Inject 25-40 Units into the skin 2 (two) times daily. 25 units at night , 40 units in the morning.) 10 mL 11 Past Week at Unknown time  . levothyroxine (SYNTHROID, LEVOTHROID) 50 MCG tablet Take 50 mcg by mouth daily before breakfast.    Past Week at Unknown time  . omega-3 fish oil (MAXEPA) 1000 MG CAPS capsule Take 1 capsule by mouth 3 (three) times daily.   Past Week at Unknown time  . pantoprazole (PROTONIX) 40 MG tablet Take 40 mg by mouth daily.   Past Week at Unknown time  . potassium chloride (K-DUR,KLOR-CON) 10 MEQ tablet Take 10 mEq by mouth daily.   Past Week at Unknown time  . traZODone (DESYREL) 50 MG tablet Take 1 tablet (50 mg total) by mouth at bedtime as needed for sleep. (Patient not taking: Reported on 05/19/2021) 30 tablet 0 Not Taking at Unknown time   Scheduled:  . aspirin  81 mg Oral Daily  . atorvastatin  40 mg Oral QHS  . Chlorhexidine Gluconate Cloth  6 each Topical Daily  . furosemide  60 mg Intravenous BID  . gabapentin  300 mg Oral Q1400  . gabapentin  600 mg Oral BID AC & HS  . insulin aspart  0-15 Units Subcutaneous TID WC  .  levothyroxine  50 mcg Oral QAC breakfast  . metoprolol tartrate  12.5 mg Oral BID  . pantoprazole  40 mg Oral Daily    Assessment: 86 yo male here with SOB and elevated troponin (noted with history of CAD and CABG). Pharmacy consulted to dose heparin. No anticoagulants noted PTA.  Heparin level therapeutic this AM, on drip rate 1100 units/hr. Hgb 10.2, pltc 187. No overt bleeding or infusion issues noted.  Goal of Therapy:  Heparin level 0.3-0.7 units/ml Monitor platelets by anticoagulation protocol: Yes   Plan:  Continue IV heparin at 1100 units/hr Monitor daily HL, CBC, s/sx bleeding  Richardine Service, PharmD, BCPS PGY2 Cardiology Pharmacy Resident Phone: [phone] 05/19/2021  12:30 PM  Please check [email or web address] for unit-specific pharmacy phone numbers.

## 2021-05-19 NOTE — Plan of Care (Signed)
  Problem: Education: Goal: Knowledge of General Education information will improve Description Including pain rating scale, medication(s)/side effects and non-pharmacologic comfort measures Outcome: Progressing   

## 2021-05-19 NOTE — Progress Notes (Signed)
Echocardiogram 2D Echocardiogram has been performed.  [name] 05/19/2021, 11:57 AM

## 2021-05-19 NOTE — Progress Notes (Signed)
   05/19/21 1100  Assess: MEWS Score  Temp 97.8 F (36.6 C)  BP (!) 128/57  Pulse Rate (!) 52  ECG Heart Rate (!) 58  Resp 14  Level of Consciousness Alert  SpO2 98 %  O2 Device Nasal Cannula  Patient Activity (if Appropriate) In bed  Assess: MEWS Score  MEWS Temp 0  MEWS Systolic 0  MEWS Pulse 0  MEWS RR 0  MEWS LOC 0  MEWS Score 0  MEWS Score Color Green  Notify: Provider  Provider Name/Title Croituro  Date Provider Notified 05/19/21  Notification Type Face-to-face  Notification Reason Change in status  Provider response In department  Document  Progress note created (see row info) Yes

## 2021-05-19 NOTE — Progress Notes (Signed)
Hypoglycemic Event  CBG: 34  Treatment: 25g of D50  Symptoms: None  Follow-up CBG: Time: 0641 CBG Result: 151  Possible Reasons for Event: Medication regime, diet  Comments/MD notified:Patient asymptomatic. States he ate dinner fine last night.    [name]

## 2021-05-19 NOTE — Consult Note (Signed)
Renal Service Consult Note [name]  [name] 05/19/2021 Robert D Schertz, MD Requesting Physician: Dr Croitoru  Reason for Consult: Renal failure HPI: The patient is a 86 y.o. year-old w/ hx of PAD, CAD sp CABG, L BKA, DM2 , CKD 4, HL, HTN, chron diast CHF presented w/ SOB and ^trop.  Pt presented w/ 1 month hx of SOB and DOE w/ pitting edema and orthopnea. CXR at OSH ([facility]) showed CHF. Pt rec'd IV lasix 60 mg w/ good response and was transferred to [facility] for admission on 6/04. He continued to receive IV lasix 60 bid. Today SOB is worse and creat up at 3.89 (last creat 3.01 in dec 2020). Asked to see for renal failure and vol overload.   Pt seen in room. SOB worse today then yesterday, not in distress. Has foley cath in. No CP. No hx of seeing kidney doctors. His PCP retired and hasn't seen MD in 6 mos. Daughter gives most of history. Not on home O2.     Dec 2020 > admitted for CHF exacerbation w/ LE edema, DOE and abd distension. CXR w/ edema. Diuresed w/ IV lasx, felt better, 2.7 L negative. DC'd home on po lasix. CKD stage 4.  DM2. PAD w/ L BKA.        Aug 2019 - admitted w/ gangrene L foot, had L BKA surgery. Also CKD stage 3, CAD hx CABG. DM2 uncontrolled. CHF. Pressure injury of the skin.    ROS  denies CP  no joint pain   no HA  no blurry vision  no rash  no diarrhea  no nausea/ vomiting  no dysuria  no difficulty voiding  no change in urine color    Past Medical History  Past Medical History:  Diagnosis Date  . Anemia   . Arthritis   . Carotid artery disease ([facility])   . CHF (congestive heart failure) ([facility])   . CKD (chronic kidney disease), stage IV ([facility])   . Coronary artery disease 2010   Status post coronary artery bypass grafting  . Diabetes mellitus    Type II  . Dyspnea   . Dysrhythmia   . Foot ulcer ([facility])   . History of blood transfusion 05/2018  . Hyperlipidemia   . Hypertension   . Hypothyroidism   . Myocardial infarction  ([facility]) 2010  . PAD (peripheral artery disease) ([facility])   . Postoperative atrial fibrillation ([facility])   . Thrombocytopenia ([facility])    Past Surgical History  Past Surgical History:  Procedure Laterality Date  . AMPUTATION Left 06/16/2018   Procedure: LEFT TRANSMETATARSAL AMPUTATION;  Surgeon: Duda, Marcus V, MD;  Location: [facility] OR;  Service: Orthopedics;  Laterality: Left;  . AMPUTATION Left 07/16/2018   Procedure: LEFT BELOW KNEE AMPUTATION;  Surgeon: Duda, Marcus V, MD;  Location: [facility] OR;  Service: Orthopedics;  Laterality: Left;  . AORTOGRAM Left 06/03/2018   Procedure: ABDOMINAL AORTOGRAM WITH CARBON DIOXIDE LEFT LOWER EXTREMITY RUNOFF;  Surgeon: Chen, Brian L, MD;  Location: [facility] OR;  Service: Vascular;  Laterality: Left;  . BELOW KNEE LEG AMPUTATION Left 07/16/2018  . carotid insuff    . CORONARY ARTERY BYPASS GRAFT  2010   LIMA to LAD, SVG to diagonal, SVG to circumflex, marginal, SVG to posterior descending branch.  . FEMORAL-POPLITEAL BYPASS GRAFT Left 06/10/2018   Procedure: LEFT COMMON FEMORAL TO BELOW KNEE POPLITEAL BYPASS GRAFT WITH PROPATEN;  Surgeon: Chen, Brian L, MD;  Location: [facility] OR;  Service: Vascular;  Laterality:   Left;   Family History  Family History  Problem Relation Age of Onset  . Heart attack Father   . Heart attack Brother    Social History  reports that he has never smoked. He has never used smokeless tobacco. He reports previous alcohol use. He reports that he does not use drugs. Allergies No Known Allergies Home medications Prior to Admission medications   Medication Sig Start Date End Date Taking? Authorizing Provider  amLODipine (NORVASC) 5 MG tablet Take 5 mg by mouth daily.   Yes [provider]  aspirin 81 MG tablet Take 81 mg by mouth at bedtime.    Yes [provider]  atorvastatin (LIPITOR) 40 MG tablet Take 40 mg by mouth at bedtime.  01/30/16  Yes [provider]  furosemide (LASIX) 40 MG tablet Take 40 mg by mouth daily as needed  (swelling (blood pressure)).   Yes [provider]  insulin glargine (LANTUS) 100 UNIT/ML injection Inject 0.1 mLs (10 Units total) into the skin at bedtime. Patient taking differently: Inject 25-40 Units into the skin 2 (two) times daily. 25 units at night , 40 units in the morning. 11/28/19  Yes Johnson, Clanford L, MD  levothyroxine (SYNTHROID, LEVOTHROID) 50 MCG tablet Take 50 mcg by mouth daily before breakfast.    Yes [provider]  omega-3 fish oil (MAXEPA) 1000 MG CAPS capsule Take 1 capsule by mouth 3 (three) times daily. 04/19/21  Yes [provider]  pantoprazole (PROTONIX) 40 MG tablet Take 40 mg by mouth daily. 11/05/19  Yes [provider]  potassium chloride (K-DUR,KLOR-CON) 10 MEQ tablet Take 10 mEq by mouth daily. 07/10/18  Yes [provider]  traZODone (DESYREL) 50 MG tablet Take 1 tablet (50 mg total) by mouth at bedtime as needed for sleep. Patient not taking: Reported on 05/19/2021 11/28/19   Johnson, Clanford L, MD     Vitals:   05/19/21 0300 05/19/21 0700 05/19/21 1100 05/19/21 1245  BP: 101/65 128/67    Pulse: (!) 54 (!) 56    Resp: 18 11    Temp: 97.8 F (36.6 C) 97.8 F (36.6 C) 97.8 F (36.6 C)   TempSrc: Oral Oral  Oral  SpO2: 96% (!) 84%    Weight: 90 kg     Height:       Exam Gen alert, no distress, elderly wm w/ nasal O2 No rash, cyanosis or gangrene Sclera anicteric, throat clear  No jvd or bruits Chest bibasilar rales RRR no MRG Abd soft ntnd no mass or ascites +bs GU normal MS no joint effusions or deformity Ext L BKA, bilat 1-2+ pitting LE edema Neuro is alert, Ox 3 , nf, moderate gen'd weakness        Home meds:  - norvasc 10/ lasix 40 prn/ kdur 10 qd  - protonix 40 qd/ synthroid 50 ug qd  - asa 81/ lipitor 40 hs  - lantus insulin 40u am and 25 u hs    Date   Creat  eGFR ml/min   2009- 2010  1.43- 2.04   2017- 2019  2.20- 2.70 21- 27, stage IV    2020   2.52- 3.01 18- 23, stage IV    05/19/21  3.89  15    UA pend   UNa, UCr pend    ECHO 6/05 - LVEF 50-55%, RV fxn severely reduced. Severe pHTN.     BP's 112- 141/ 65- 88. HR 60's, RR 18- 24. Temp wnl.      CXR 6/05 - IMPRESSION: Cardiomegaly with pulmonary vascular congestion. Pulmonary edema remains, although there has been partial clearing of pulmonary edema compared to 2 days prior. Suspect alveolar edema in the left base region, although pneumonia in this area could present in this manner.  Assessment/ Plan: 1. AKI on CKD IV - b/l creat from 2020 was 2.5- 3.0, eGFR 18- 23. Progression of CKD vs AKI.  Admitted w/ vol overload and pulm edema/ SOB. Hasn't seen MD since PCP retired 6 mos ago. Creat here is 3.89, no signs of uremia. LVEF 50-55%, very poor RV fxn. UA and renal US pending. Attempting to diurese last 48hrs but SOB worse today. Agree w/ increasing to high dose IV lasix 120 mg tid. Neither pt nor his daughter are in favor of dialysis. I would not recommend dialysis either. Given his age, frailty and comorbidities he would likely do poorly. Hopefully he will respond to diuresis. They understand that if medical therapy doesn't work that hospice/ comfort care may be needed. 2. Vol overload/ pulm edema - as above 3. Chron diast HF 4. HTN 5. IDDM      Rob Schertz  MD 05/19/2021, 1:23 PM  Recent Labs  Lab 05/19/21 0610  WBC 7.8  HGB 10.2*   Recent Labs  Lab 05/19/21 0610  K 4.8  BUN 70*  CREATININE 3.89*  CALCIUM 8.3*    

## 2021-05-20 ENCOUNTER — Inpatient Hospital Stay (HOSPITAL_COMMUNITY): Payer: Medicare HMO

## 2021-05-20 DIAGNOSIS — I50810 Right heart failure, unspecified: Secondary | ICD-10-CM

## 2021-05-20 DIAGNOSIS — M7989 Other specified soft tissue disorders: Secondary | ICD-10-CM

## 2021-05-20 LAB — GLUCOSE, CAPILLARY
Glucose-Capillary: 196 mg/dL — ABNORMAL HIGH (ref 70–99)
Glucose-Capillary: 201 mg/dL — ABNORMAL HIGH (ref 70–99)
Glucose-Capillary: 179 mg/dL — ABNORMAL HIGH (ref 70–99)
Glucose-Capillary: 173 mg/dL — ABNORMAL HIGH (ref 70–99)
Glucose-Capillary: 252 mg/dL — ABNORMAL HIGH (ref 70–99)

## 2021-05-20 LAB — BASIC METABOLIC PANEL
Anion gap: 12 (ref 5–15)
BUN: 74 mg/dL — ABNORMAL HIGH (ref 8–23)
CO2: 20 mmol/L — ABNORMAL LOW (ref 22–32)
Calcium: 8.1 mg/dL — ABNORMAL LOW (ref 8.9–10.3)
Chloride: 102 mmol/L (ref 98–111)
Creatinine, Ser: 4.18 mg/dL — ABNORMAL HIGH (ref 0.61–1.24)
GFR, Estimated: 13 mL/min — ABNORMAL LOW (ref >60)
Glucose, Bld: 263 mg/dL — ABNORMAL HIGH (ref 70–99)
Potassium: 4.7 mmol/L (ref 3.5–5.1)
Sodium: 134 mmol/L — ABNORMAL LOW (ref 135–145)

## 2021-05-20 LAB — HEMOGLOBIN A1C
Hgb A1c MFr Bld: 7.7 % — ABNORMAL HIGH (ref 4.8–5.6)
Mean Plasma Glucose: 174 mg/dL

## 2021-05-20 LAB — CBC
HCT: 32.8 % — ABNORMAL LOW (ref 39.0–52.0)
Hemoglobin: 10.1 g/dL — ABNORMAL LOW (ref 13.0–17.0)
MCH: 31.2 pg (ref 26.0–34.0)
MCHC: 30.8 g/dL (ref 30.0–36.0)
MCV: 101.2 fL — ABNORMAL HIGH (ref 80.0–100.0)
Platelets: 176 10*3/uL (ref 150–400)
RBC: 3.24 MIL/uL — ABNORMAL LOW (ref 4.22–5.81)
RDW: 12.8 % (ref 11.5–15.5)
WBC: 6.8 10*3/uL (ref 4.0–10.5)
nRBC: 0 % (ref 0.0–0.2)

## 2021-05-20 LAB — D-DIMER, QUANTITATIVE: D-Dimer, Quant: 1.82 ug/mL-FEU — ABNORMAL HIGH (ref 0.00–0.50)

## 2021-05-20 LAB — HEPARIN LEVEL (UNFRACTIONATED): Heparin Unfractionated: 0.24 IU/mL — ABNORMAL LOW (ref 0.30–0.70)

## 2021-05-20 MED ORDER — IPRATROPIUM-ALBUTEROL 0.5-2.5 (3) MG/3ML IN SOLN
3.0000 mL | Freq: Three times a day (TID) | RESPIRATORY_TRACT | Status: DC
  Administered 2021-05-21 – 2021-05-22 (×4): 3 mL via RESPIRATORY_TRACT
  Filled 2021-05-20 (×4): qty 3

## 2021-05-20 MED ORDER — IPRATROPIUM-ALBUTEROL 0.5-2.5 (3) MG/3ML IN SOLN: 3.0000 mL | RESPIRATORY_TRACT | Status: DC | PRN

## 2021-05-20 MED ORDER — NAPHAZOLINE-PHENIRAMINE 0.025-0.3 % OP SOLN
1.0000 [drp] | Freq: Four times a day (QID) | OPHTHALMIC | Status: DC | PRN
  Filled 2021-05-20: qty 15

## 2021-05-20 MED ORDER — FUROSEMIDE 10 MG/ML IJ SOLN
120.0000 mg | Freq: Two times a day (BID) | INTRAVENOUS | Status: DC
  Administered 2021-05-20 – 2021-05-21 (×2): 120 mg via INTRAVENOUS
  Filled 2021-05-20 (×8): qty 12

## 2021-05-20 MED ORDER — IPRATROPIUM-ALBUTEROL 0.5-2.5 (3) MG/3ML IN SOLN
3.0000 mL | RESPIRATORY_TRACT | Status: DC
  Administered 2021-05-20 (×3): 3 mL via RESPIRATORY_TRACT
  Filled 2021-05-20 (×3): qty 3

## 2021-05-20 NOTE — Plan of Care (Signed)

## 2021-05-20 NOTE — Progress Notes (Signed)
Lower extremity venous RT study completed.  Preliminary results relayed to   See CV Proc for preliminary results report.   Ocia Simek, RDMS, RVT  

## 2021-05-20 NOTE — Progress Notes (Signed)
Patient ID: [name], male   DOB: [birth year], 86 y.o.   MRN: [id number] S:Feels better today.  UOP of 3.9 liters overnight. O:BP 111/64 (BP Location: Left Arm)   Pulse 72   Temp 98.2 F (36.8 C) (Oral)   Resp 18   Ht '5\' 4"'$  (1.626 m)   Wt 87.5 kg   SpO2 96%   BMI 33.11 kg/m   Intake/Output Summary (Last 24 hours) at 05/20/2021 1040 Last data filed at 05/20/2021 0600 Gross per 24 hour  Intake 956.05 ml  Output 3900 ml  Net -2943.95 ml   Intake/Output: I/O last 3 completed shifts: In: 1312.4 [P.O.:900; I.V.:412.4] Out: 4100 [Urine:4100]  Intake/Output this shift:  No intake/output data recorded. Weight change: 2.9 kg Gen: NAD CVS: RRR Resp: rhonchi bilaterally Abd: +BS, soft, NT Ext: 1+ RLE edema, s/p LBKA  Recent Labs  Lab 05/19/21 0610 05/20/21 0130  NA 135 134*  K 4.8 4.7  CL 108 102  CO2 18* 20*  GLUCOSE 40* 263*  BUN 70* 74*  CREATININE 3.89* 4.18*  CALCIUM 8.3* 8.1*   Liver Function Tests: No results for input(s): AST, ALT, ALKPHOS, BILITOT, PROT, ALBUMIN in the last 168 hours. No results for input(s): LIPASE, AMYLASE in the last 168 hours. No results for input(s): AMMONIA in the last 168 hours. CBC: Recent Labs  Lab 05/19/21 0610 05/20/21 0130  WBC 7.8 6.8  HGB 10.2* 10.1*  HCT 33.3* 32.8*  MCV 101.2* 101.2*  PLT 187 176   Cardiac Enzymes: No results for input(s): CKTOTAL, CKMB, CKMBINDEX, TROPONINI in the last 168 hours. CBG: Recent Labs  Lab 05/19/21 1154 05/19/21 1645 05/19/21 2102 05/20/21 0559 05/20/21 0618  GLUCAP 204* 173* 193* 201* 196*    Iron Studies: No results for input(s): IRON, TIBC, TRANSFERRIN, FERRITIN in the last 72 hours. Studies/Results: US RENAL  Result Date: 05/19/2021 CLINICAL DATA:  Stage IV CKD, acute renal failure. EXAM: RENAL / URINARY TRACT ULTRASOUND COMPLETE COMPARISON:  Renal ultrasound February 2019 FINDINGS: Right Kidney: Renal measurements: 11.2 x 5.2 x 4.9 cm = volume: 148 mL. Echogenicity within  normal limits. No mass or hydronephrosis visualized. Left Kidney: Renal measurements: 12 x 5.7 x 4.1 cm = volume: 146 mL. Echogenicity within normal limits. No mass or hydronephrosis visualized. Bladder: Appears normal for degree of bladder distention. Other: None. IMPRESSION: No hydronephrosis. Electronically Signed   By: Dahlia Bailiff MD   On: 05/19/2021 15:51   DG CHEST PORT 1 VIEW  Result Date: 05/19/2021 CLINICAL DATA:  Shortness of breath EXAM: PORTABLE CHEST 1 VIEW COMPARISON:  May 17, 2021 FINDINGS: There is overall somewhat less interstitial and patchy alveolar pulmonary edema. Moderate edema remains, with the greatest degree of airspace opacity in the left base currently. There is cardiomegaly with pulmonary venous hypertension. No adenopathy. Patient is status post coronary artery bypass grafting. There is aortic atherosclerosis. No bone lesions IMPRESSION: Cardiomegaly with pulmonary vascular congestion. Pulmonary edema remains, although there has been partial clearing of pulmonary edema compared to 2 days prior. Suspect alveolar edema in the left base region, although pneumonia in this area could present in this manner. Both pneumonia and edema may be present concurrently. Status post coronary artery bypass grafting. Aortic Atherosclerosis (ICD10-I70.0). Electronically Signed   By: Lowella Grip III M.D.   On: 05/19/2021 14:04   DG Shoulder Right Port  Result Date: 05/18/2021 CLINICAL DATA:  RIGHT shoulder pain.  No injury. EXAM: PORTABLE RIGHT SHOULDER COMPARISON:  None. FINDINGS: Osseous alignment appears normal. No  fracture line or displaced fracture fragment. No acute-appearing cortical irregularity or osseous lesion. No significant degenerative change. Soft tissues about the RIGHT shoulder are unremarkable. IMPRESSION: Negative. Electronically Signed   By: Franki Cabot M.D.   On: 05/18/2021 15:50   ECHOCARDIOGRAM COMPLETE  Result Date: 05/19/2021    ECHOCARDIOGRAM REPORT   Patient  Name:   [name] Date of Exam: 05/19/2021 Medical Rec #:  [id number]        Height:       64.0 in Accession #:    [id number]       Weight:       198.4 lb Date of Birth:  [birth year]        BSA:          1.950 m Patient Age:    70 years         BP:           127/57 mmHg Patient Gender: M                HR:           54 bpm. Exam Location:  Inpatient Procedure: 2D Echo, Color Doppler and Cardiac Doppler Indications:    NSTEMI i21.4  History:        Patient has prior history of Echocardiogram examinations, most                 recent 06/08/2018. CHF, Prior CABG; Risk Factors:Hypertension,                 Diabetes and Dyslipidemia.  Sonographer:    Raquel Sarna Senior RDCS Referring Phys: [id number] [place]  1. Left ventricular ejection fraction, by estimation, is 50 to 55%. The left ventricle has low normal function. The left ventricle demonstrates regional wall motion abnormalities (see scoring diagram/findings for description). There is mild left ventricular hypertrophy. Left ventricular diastolic parameters are indeterminate.  2. Right ventricular systolic function is moderately reduced. The right ventricular size is moderately enlarged. There is severely elevated pulmonary artery systolic pressure. The estimated right ventricular systolic pressure is 123XX123 mmHg.  3. Left atrial size was mildly dilated.  4. The mitral valve is abnormal. Trivial mitral valve regurgitation. Severe mitral annular calcification.  5. The aortic valve is tricuspid. There is mild calcification of the aortic valve. Aortic valve regurgitation is not visualized. Mild to moderate aortic valve sclerosis/calcification is present, without any evidence of aortic stenosis. Aortic valve mean  gradient measures 5.0 mmHg.  6. The inferior vena cava is normal in size with <50% respiratory variability, suggesting right atrial pressure of 8 mmHg. FINDINGS  Left Ventricle: Left ventricular ejection fraction, by estimation, is 50 to 55%. The  left ventricle has low normal function. The left ventricle demonstrates regional wall motion abnormalities. The left ventricular internal cavity size was normal in size. There is mild left ventricular hypertrophy. Left ventricular diastolic parameters are indeterminate.  LV Wall Scoring: The basal inferolateral segment and basal inferior segment are hypokinetic. Right Ventricle: The right ventricular size is moderately enlarged. No increase in right ventricular wall thickness. Right ventricular systolic function is moderately reduced. There is severely elevated pulmonary artery systolic pressure. The tricuspid regurgitant velocity is 3.83 m/s, and with an assumed right atrial pressure of 8 mmHg, the estimated right ventricular systolic pressure is 123XX123 mmHg. Left Atrium: Left atrial size was mildly dilated. Right Atrium: Right atrial size was normal in size. Pericardium: There is no evidence of pericardial effusion. Mitral Valve: The  mitral valve is abnormal. Severe mitral annular calcification. Trivial mitral valve regurgitation. Tricuspid Valve: The tricuspid valve is grossly normal. Tricuspid valve regurgitation is mild. Aortic Valve: The aortic valve is tricuspid. There is mild calcification of the aortic valve. There is moderate aortic valve annular calcification. Aortic valve regurgitation is not visualized. Mild to moderate aortic valve sclerosis/calcification is present, without any evidence of aortic stenosis. Aortic valve mean gradient measures 5.0 mmHg. Aortic valve peak gradient measures 10.1 mmHg. Aortic valve area, by VTI measures 1.24 cm. Pulmonic Valve: The pulmonic valve was grossly normal. Pulmonic valve regurgitation is mild. Aorta: The aortic root is normal in size and structure. Venous: The inferior vena cava is normal in size with less than 50% respiratory variability, suggesting right atrial pressure of 8 mmHg. IAS/Shunts: No atrial level shunt detected by color flow Doppler.  LEFT VENTRICLE  PLAX 2D LVIDd:         4.20 cm LVIDs:         3.70 cm LV PW:         1.10 cm LV IVS:        0.90 cm LVOT diam:     1.80 cm LV SV:         44 LV SV Index:   23 LVOT Area:     2.54 cm  RIGHT VENTRICLE RV S prime:     5.55 cm/s TAPSE (M-mode): 1.2 cm LEFT ATRIUM             Index       RIGHT ATRIUM           Index LA diam:        3.80 cm 1.95 cm/m  RA Area:     19.10 cm LA Vol (A2C):   80.0 ml 41.03 ml/m RA Volume:   55.80 ml  28.62 ml/m LA Vol (A4C):   53.7 ml 27.54 ml/m LA Biplane Vol: 67.9 ml 34.82 ml/m  AORTIC VALVE AV Area (Vmax):    1.17 cm AV Area (Vmean):   1.26 cm AV Area (VTI):     1.24 cm AV Vmax:           159.00 cm/s AV Vmean:          109.000 cm/s AV VTI:            0.356 m AV Peak Grad:      10.1 mmHg AV Mean Grad:      5.0 mmHg LVOT Vmax:         73.40 cm/s LVOT Vmean:        54.000 cm/s LVOT VTI:          0.173 m LVOT/AV VTI ratio: 0.49  AORTA Ao Asc diam: 3.20 cm TRICUSPID VALVE TR Peak grad:   58.7 mmHg TR Vmax:        383.00 cm/s  SHUNTS Systemic VTI:  0.17 m Systemic Diam: 1.80 cm Rozann Lesches MD Electronically signed by Rozann Lesches MD Signature Date/Time: 05/19/2021/12:57:31 PM    Final    . aspirin  81 mg Oral Daily  . atorvastatin  40 mg Oral QHS  . Chlorhexidine Gluconate Cloth  6 each Topical Daily  . gabapentin  300 mg Oral Q1400  . gabapentin  600 mg Oral BID AC & HS  . insulin aspart  0-15 Units Subcutaneous TID WC  . ipratropium-albuterol  3 mL Nebulization Q4H  . levothyroxine  50 mcg Oral QAC breakfast  . metoprolol tartrate  12.5 mg Oral  BID  . pantoprazole  40 mg Oral Daily    BMET    Component Value Date/Time   NA 134 (L) 05/20/2021 0130   K 4.7 05/20/2021 0130   CL 102 05/20/2021 0130   CO2 20 (L) 05/20/2021 0130   GLUCOSE 263 (H) 05/20/2021 0130   BUN 74 (H) 05/20/2021 0130   CREATININE 4.18 (H) 05/20/2021 0130   CALCIUM 8.1 (L) 05/20/2021 0130   GFRNONAA 13 (L) 05/20/2021 0130   GFRAA 21 (L) 11/27/2019 0633   CBC    Component Value  Date/Time   WBC 6.8 05/20/2021 0130   RBC 3.24 (L) 05/20/2021 0130   HGB 10.1 (L) 05/20/2021 0130   HCT 32.8 (L) 05/20/2021 0130   PLT 176 05/20/2021 0130   MCV 101.2 (H) 05/20/2021 0130   MCH 31.2 05/20/2021 0130   MCHC 30.8 05/20/2021 0130   RDW 12.8 05/20/2021 0130   LYMPHSABS 1.5 11/25/2019 1731   MONOABS 0.5 11/25/2019 1731   EOSABS 0.4 11/25/2019 1731   BASOSABS 0.0 11/25/2019 1731     Assessment/Plan:  1. AKI/CKD stage IV - in setting of acute on chronic diastolic CHF and escalating diuretics consistent with cardiorenal syndrome.  His Scr increased from 3.89 to 4.18 after UOP of 3.9 liters with lasix 120 mg IV tid.  Will decrease frequency to bid given brisk diuresis and rising BUN/Cr.  He is not a suitable candidate for dialysis given his poor functional status and multiple comorbidities.  Per Dr. Jonnie Finner, he and his daughter are not in favor of dialysis and will continue to treat conservatively 2. Acute on chronic diastolic CHF - responded to IV lasix 120 mg tid with 3.9 liters but rise in BUN/Cr.  Will decrease frequency to bid and follow.  He was on lasix 40 mg daily but may require switch to torsemide at time of discharge.  3. Anemia of CKD stage IV - follow H/H, iron stores.  Hold off on ESA for now.  4. HTN - stable 5. IDDM - per primary  Donetta Potts, MD [facility] [phone]

## 2021-05-20 NOTE — TOC Progression Note (Addendum)
Transition of Care [facility]) - Progression Note    Patient Details  Name: [name] MRN: [id number] Date of Birth: 1935/04/02  Transition of Care [facility]) CM/SW Contact  Zenon Mayo, RN Phone Number: 05/20/2021, 4:34 PM  Clinical Narrative:    Patient states he has a PCP Dr.Xaje Hasanaj in [facility] apt is scheduled for hospital follow up on 6/20 at 1:15.        Expected Discharge Plan and Services                                                 Social Determinants of Health (SDOH) Interventions    Readmission Risk Interventions No flowsheet data found.

## 2021-05-20 NOTE — Progress Notes (Signed)
Patient resting comfortably on 3L . No respiratory distress noted. BIPAP not needed at this time.

## 2021-05-20 NOTE — Progress Notes (Signed)
[place] for heparin  Indication: chest pain/ACS  No Known Allergies  Patient Measurements: Height: '5\' 4"'$  (162.6 cm) Weight: 87.5 kg (192 lb 14.4 oz) IBW/kg (Calculated) : 59.2 HEPARIN DW (KG): 77.2   Vital Signs: Temp: 98.8 F (37.1 C) (06/06 1100) Temp Source: Axillary (06/06 1200) BP: 119/91 (06/06 1200) Pulse Rate: 138 (06/06 1200)  Labs: Recent Labs    05/18/21 1939 05/19/21 0610 05/20/21 0130  HGB  --  10.2* 10.1*  HCT  --  33.3* 32.8*  PLT  --  187 176  HEPARINUNFRC 0.21* 0.33 0.24*  CREATININE  --  3.89* 4.18*  TROPONINIHS  --  593*  --     Estimated Creatinine Clearance: 13.1 mL/min (A) (by C-G formula based on SCr of 4.18 mg/dL (H)).   Medical History: Past Medical History:  Diagnosis Date  . Anemia   . Arthritis   . Carotid artery disease ([place])   . CHF (congestive heart failure) ([place])   . CKD (chronic kidney disease), stage IV ([place])   . Coronary artery disease 2010   Status post coronary artery bypass grafting  . Diabetes mellitus    Type II  . Dyspnea   . Dysrhythmia   . Foot ulcer ([place])   . History of blood transfusion 05/2018  . Hyperlipidemia   . Hypertension   . Hypothyroidism   . Myocardial infarction ([place]) 2010  . PAD (peripheral artery disease) ([place])   . Postoperative atrial fibrillation ([place])   . Thrombocytopenia [facility])    Assessment: 86 yo male here with SOB and elevated troponin (noted with history of CAD and CABG). Pharmacy consulted to dose heparin. No anticoagulants noted PTA.  Heparin level low this AM, on drip rate 1100 units/hr. Hgb 10.1, pltc 176. No overt bleeding or infusion issues noted.  Goal of Therapy:  Heparin level 0.3-0.7 units/ml Monitor platelets by anticoagulation protocol: Yes   Plan:  Increase IV heparin to 1250 units/hr Monitor daily HL, CBC, s/sx bleeding  Erin Hearing PharmD., BCPS Clinical Pharmacist 05/20/2021 2:26 PM  Please check [email or web address] for  unit-specific pharmacy phone numbers.

## 2021-05-20 NOTE — Progress Notes (Addendum)
Progress Note  Patient Name: [name] Date of Encounter: 05/20/2021  [facility] Cardiologist: Kate Sable, MD (Inactive)   Subjective   Feeling well.  Wants to go home.   Inpatient Medications    Scheduled Meds: . aspirin  81 mg Oral Daily  . atorvastatin  40 mg Oral QHS  . Chlorhexidine Gluconate Cloth  6 each Topical Daily  . gabapentin  300 mg Oral Q1400  . gabapentin  600 mg Oral BID AC & HS  . insulin aspart  0-15 Units Subcutaneous TID WC  . levothyroxine  50 mcg Oral QAC breakfast  . metoprolol tartrate  12.5 mg Oral BID  . pantoprazole  40 mg Oral Daily   Continuous Infusions: . furosemide    . heparin 1,100 Units/hr (05/20/21 0600)   PRN Meds: acetaminophen, nitroGLYCERIN, ondansetron (ZOFRAN) IV, traZODone   Vital Signs    Vitals:   05/20/21 0500 05/20/21 0600 05/20/21 0700 05/20/21 0800  BP: (!) 103/59 (!) 118/54 (!) 131/58 111/64  Pulse:  65 66 72  Resp: 18 (!) 21 (!) 24 18  Temp:   98.2 F (36.8 C)   TempSrc:   Oral Oral  SpO2:  97% 98% 96%  Weight: 87.5 kg     Height:        Intake/Output Summary (Last 24 hours) at 05/20/2021 0851 Last data filed at 05/20/2021 0600 Gross per 24 hour  Intake 956.05 ml  Output 3900 ml  Net -2943.95 ml   Last 3 Weights 05/20/2021 05/19/2021 05/18/2021  Weight (lbs) 192 lb 14.4 oz 198 lb 6.6 oz 186 lb 8.2 oz  Weight (kg) 87.5 kg 90 kg 84.6 kg      Telemetry    Sinus rhythm.  Frequent PVCs - Personally Reviewed  ECG    05/20/21: Sinus rhythm. Rate 62 bpm.  First degree AV block.  Frequent PVCs. Prior inferior infarct - Personally Reviewed  Physical Exam   VS:  BP 111/64 (BP Location: Left Arm)   Pulse 72   Temp 98.2 F (36.8 C) (Oral)   Resp 18   Ht '5\' 4"'$  (1.626 m)   Wt 87.5 kg   SpO2 96%   BMI 33.11 kg/m  , BMI Body mass index is 33.11 kg/m. GENERAL:  Well appearing HEENT: Pupils equal round and reactive, fundi not visualized, oral mucosa unremarkable NECK:  No jugular venous  distention, waveform within normal limits, carotid upstroke brisk and symmetric, no bruits LUNGS:  Expiratory wheezing bilaterally HEART:  RRR.  PMI not displaced or sustained,S1 and S2 within normal limits, no S3, no S4, no clicks, no rubs, no murmurs ABD:  Flat, positive bowel sounds normal in frequency in pitch, no bruits, no rebound, no guarding, no midline pulsatile mass, no hepatomegaly, no splenomegaly EXT:  L BKA.  1+ R LE edema, no cyanosis no clubbing SKIN:  No rashes no nodules NEURO:  Cranial nerves II through XII grossly intact, motor grossly intact throughout [facility]:  Cognitively intact, oriented to person place and time  Labs    High Sensitivity Troponin:   Recent Labs  Lab 05/19/21 0610  TROPONINIHS 593*      Chemistry Recent Labs  Lab 05/19/21 0610 05/20/21 0130  NA 135 134*  K 4.8 4.7  CL 108 102  CO2 18* 20*  GLUCOSE 40* 263*  BUN 70* 74*  CREATININE 3.89* 4.18*  CALCIUM 8.3* 8.1*  GFRNONAA 15* 13*  ANIONGAP 9 12     Hematology Recent Labs  Lab  05/19/21 0610 05/20/21 0130  WBC 7.8 6.8  RBC 3.29* 3.24*  HGB 10.2* 10.1*  HCT 33.3* 32.8*  MCV 101.2* 101.2*  MCH 31.0 31.2  MCHC 30.6 30.8  RDW 13.2 12.8  PLT 187 176    BNP Recent Labs  Lab 05/19/21 1422  BNP 799.7*     DDimer No results for input(s): DDIMER in the last 168 hours.   Radiology    US RENAL  Result Date: 05/19/2021 CLINICAL DATA:  Stage IV CKD, acute renal failure. EXAM: RENAL / URINARY TRACT ULTRASOUND COMPLETE COMPARISON:  Renal ultrasound February 2019 FINDINGS: Right Kidney: Renal measurements: 11.2 x 5.2 x 4.9 cm = volume: 148 mL. Echogenicity within normal limits. No mass or hydronephrosis visualized. Left Kidney: Renal measurements: 12 x 5.7 x 4.1 cm = volume: 146 mL. Echogenicity within normal limits. No mass or hydronephrosis visualized. Bladder: Appears normal for degree of bladder distention. Other: None. IMPRESSION: No hydronephrosis. Electronically Signed   By:  Dahlia Bailiff MD   On: 05/19/2021 15:51   DG CHEST PORT 1 VIEW  Result Date: 05/19/2021 CLINICAL DATA:  Shortness of breath EXAM: PORTABLE CHEST 1 VIEW COMPARISON:  May 17, 2021 FINDINGS: There is overall somewhat less interstitial and patchy alveolar pulmonary edema. Moderate edema remains, with the greatest degree of airspace opacity in the left base currently. There is cardiomegaly with pulmonary venous hypertension. No adenopathy. Patient is status post coronary artery bypass grafting. There is aortic atherosclerosis. No bone lesions IMPRESSION: Cardiomegaly with pulmonary vascular congestion. Pulmonary edema remains, although there has been partial clearing of pulmonary edema compared to 2 days prior. Suspect alveolar edema in the left base region, although pneumonia in this area could present in this manner. Both pneumonia and edema may be present concurrently. Status post coronary artery bypass grafting. Aortic Atherosclerosis (ICD10-I70.0). Electronically Signed   By: Lowella Grip III M.D.   On: 05/19/2021 14:04   DG Shoulder Right Port  Result Date: 05/18/2021 CLINICAL DATA:  RIGHT shoulder pain.  No injury. EXAM: PORTABLE RIGHT SHOULDER COMPARISON:  None. FINDINGS: Osseous alignment appears normal. No fracture line or displaced fracture fragment. No acute-appearing cortical irregularity or osseous lesion. No significant degenerative change. Soft tissues about the RIGHT shoulder are unremarkable. IMPRESSION: Negative. Electronically Signed   By: Franki Cabot M.D.   On: 05/18/2021 15:50   ECHOCARDIOGRAM COMPLETE  Result Date: 05/19/2021    ECHOCARDIOGRAM REPORT   Patient Name:   [name] Date of Exam: 05/19/2021 Medical Rec #:  [id number]        Height:       64.0 in Accession #:    [id number]       Weight:       198.4 lb Date of Birth:  02/04/1935        BSA:          1.950 m Patient Age:    86 years         BP:           127/57 mmHg Patient Gender: M                HR:           54  bpm. Exam Location:  Inpatient Procedure: 2D Echo, Color Doppler and Cardiac Doppler Indications:    NSTEMI i21.4  History:        Patient has prior history of Echocardiogram examinations, most  recent 06/08/2018. CHF, Prior CABG; Risk Factors:Hypertension,                 Diabetes and Dyslipidemia.  Sonographer:    Raquel Sarna Senior RDCS Referring Phys: [id number] [place]  1. Left ventricular ejection fraction, by estimation, is 50 to 55%. The left ventricle has low normal function. The left ventricle demonstrates regional wall motion abnormalities (see scoring diagram/findings for description). There is mild left ventricular hypertrophy. Left ventricular diastolic parameters are indeterminate.  2. Right ventricular systolic function is moderately reduced. The right ventricular size is moderately enlarged. There is severely elevated pulmonary artery systolic pressure. The estimated right ventricular systolic pressure is 123XX123 mmHg.  3. Left atrial size was mildly dilated.  4. The mitral valve is abnormal. Trivial mitral valve regurgitation. Severe mitral annular calcification.  5. The aortic valve is tricuspid. There is mild calcification of the aortic valve. Aortic valve regurgitation is not visualized. Mild to moderate aortic valve sclerosis/calcification is present, without any evidence of aortic stenosis. Aortic valve mean  gradient measures 5.0 mmHg.  6. The inferior vena cava is normal in size with <50% respiratory variability, suggesting right atrial pressure of 8 mmHg. FINDINGS  Left Ventricle: Left ventricular ejection fraction, by estimation, is 50 to 55%. The left ventricle has low normal function. The left ventricle demonstrates regional wall motion abnormalities. The left ventricular internal cavity size was normal in size. There is mild left ventricular hypertrophy. Left ventricular diastolic parameters are indeterminate.  LV Wall Scoring: The basal inferolateral segment  and basal inferior segment are hypokinetic. Right Ventricle: The right ventricular size is moderately enlarged. No increase in right ventricular wall thickness. Right ventricular systolic function is moderately reduced. There is severely elevated pulmonary artery systolic pressure. The tricuspid regurgitant velocity is 3.83 m/s, and with an assumed right atrial pressure of 8 mmHg, the estimated right ventricular systolic pressure is 123XX123 mmHg. Left Atrium: Left atrial size was mildly dilated. Right Atrium: Right atrial size was normal in size. Pericardium: There is no evidence of pericardial effusion. Mitral Valve: The mitral valve is abnormal. Severe mitral annular calcification. Trivial mitral valve regurgitation. Tricuspid Valve: The tricuspid valve is grossly normal. Tricuspid valve regurgitation is mild. Aortic Valve: The aortic valve is tricuspid. There is mild calcification of the aortic valve. There is moderate aortic valve annular calcification. Aortic valve regurgitation is not visualized. Mild to moderate aortic valve sclerosis/calcification is present, without any evidence of aortic stenosis. Aortic valve mean gradient measures 5.0 mmHg. Aortic valve peak gradient measures 10.1 mmHg. Aortic valve area, by VTI measures 1.24 cm. Pulmonic Valve: The pulmonic valve was grossly normal. Pulmonic valve regurgitation is mild. Aorta: The aortic root is normal in size and structure. Venous: The inferior vena cava is normal in size with less than 50% respiratory variability, suggesting right atrial pressure of 8 mmHg. IAS/Shunts: No atrial level shunt detected by color flow Doppler.  LEFT VENTRICLE PLAX 2D LVIDd:         4.20 cm LVIDs:         3.70 cm LV PW:         1.10 cm LV IVS:        0.90 cm LVOT diam:     1.80 cm LV SV:         44 LV SV Index:   23 LVOT Area:     2.54 cm  RIGHT VENTRICLE RV S prime:     5.55 cm/s TAPSE (M-mode): 1.2 cm LEFT  ATRIUM             Index       RIGHT ATRIUM           Index LA  diam:        3.80 cm 1.95 cm/m  RA Area:     19.10 cm LA Vol (A2C):   80.0 ml 41.03 ml/m RA Volume:   55.80 ml  28.62 ml/m LA Vol (A4C):   53.7 ml 27.54 ml/m LA Biplane Vol: 67.9 ml 34.82 ml/m  AORTIC VALVE AV Area (Vmax):    1.17 cm AV Area (Vmean):   1.26 cm AV Area (VTI):     1.24 cm AV Vmax:           159.00 cm/s AV Vmean:          109.000 cm/s AV VTI:            0.356 m AV Peak Grad:      10.1 mmHg AV Mean Grad:      5.0 mmHg LVOT Vmax:         73.40 cm/s LVOT Vmean:        54.000 cm/s LVOT VTI:          0.173 m LVOT/AV VTI ratio: 0.49  AORTA Ao Asc diam: 3.20 cm TRICUSPID VALVE TR Peak grad:   58.7 mmHg TR Vmax:        383.00 cm/s  SHUNTS Systemic VTI:  0.17 m Systemic Diam: 1.80 cm Rozann Lesches MD Electronically signed by Rozann Lesches MD Signature Date/Time: 05/19/2021/12:57:31 PM    Final     Cardiac Studies   ECHO 05/19/2021 1. Left ventricular ejection fraction, by estimation, is 50 to 55%. The  left ventricle has low normal function. The left ventricle demonstrates  regional wall motion abnormalities (see scoring diagram/findings for  description). There is mild left  ventricular hypertrophy. Left ventricular diastolic parameters are  indeterminate.  2. Right ventricular systolic function is moderately reduced. The right  ventricular size is moderately enlarged. There is severely elevated  pulmonary artery systolic pressure. The estimated right ventricular  systolic pressure is 123XX123 mmHg.  3. Left atrial size was mildly dilated.  4. The mitral valve is abnormal. Trivial mitral valve regurgitation.  Severe mitral annular calcification.  5. The aortic valve is tricuspid. There is mild calcification of the  aortic valve. Aortic valve regurgitation is not visualized. Mild to  moderate aortic valve sclerosis/calcification is present, without any  evidence of aortic stenosis. Aortic valve mean  gradient measures 5.0 mmHg.  6. The inferior vena cava is normal in size  with <50% respiratory  variability, suggesting right atrial pressure of 8 mmHg.   Echocardiogram: 05/2018 Study Conclusions   - Left ventricle: The cavity size was normal. Wall thickness was  increased in a pattern of mild LVH. Systolic function was normal.  The estimated ejection fraction was in the range of 55% to 60%.  Wall motion was normal; there were no regional wall motion  abnormalities. Features are consistent with a pseudonormal left  ventricular filling pattern, with concomitant abnormal relaxation  and increased filling pressure (grade 2 diastolic dysfunction).  - Aortic valve: Mildly calcified annulus. Mildly thickened, mildly  calcified leaflets.  - Mitral valve: Mildly calcified annulus. Moderately thickened,  moderately calcified leaflets .   Patient Profile     86 y.o. male with CAD status post CABG, PAD status post left-pop bypass 05/2017 and left BKA, hypertension, hyperlipidemia, diabetes, carotid stenosis, and  stage IV CKD admitted with hypoxic respiratory failure, volume overload, and elevated troponin.  Assessment & Plan    # Acute diastolic heart failure:  # Pulmonary hypertension:  # Acute on chronic renal failure:   Left ventricular systolic function was relatively preserved on echo this admission.  Right ventricular function is reduced and pulmonary pressures are elevated.  proBNP was elevated to 10,000 at [place] and chest x-ray was consistent with heart failure.  He initially diuresed well with IV Lasix but then renal function worsened despite remaining volume overloaded.  He is not a candidate for hemodialysis and nephrology is now involved.  Yesterday he received a higher dose of Lasix and was net -2.9 L.  However renal function has increased from 3.9-4.2.  He has been successfully taken off BiPAP and is now on nasal cannula.  His right ventricular dysfunction and elevated pulmonary pressures are new.  He has been on IV heparin for demand  ischemia.  He is also at high risk of PE and DVT given that he is wheelchair-bound.  He cannot get a CT-A due to renal dysfunction.  A V/Q scan is unlikely to be helpful given that he has so much pulmonary edema on chest x-ray.  We will check a d-dimer and R LE Doppler.  # CAD s/p CABG:  # Elevated troponin: # Hyperlipidemia:  CABGin 2010 with LIMA-LAD, SVG-D1, SVG-LCx and SVG-PDA.  High-sensitivity troponin was elevated to 937 at outside hospital.  Now downtrending.  He has not had chest pain.  Likely due to demand ischemia.  Not a candidate for cath in the setting of his renal dysfunction. Continue atorvastatin.  # PVCs:  Frequent PVCs on telemetry with ventricular bigeminy and NSVT.   Continue metoprolol.  # PAD: S/p L BKA.  Wheelchair bound.      For questions or updates, please contact [place] Please consult [email or web address] for contact info under        Signed, Skeet Latch, MD  05/20/2021, 8:51 AM

## 2021-05-20 NOTE — Progress Notes (Signed)
Heart Failure Navigator Progress Note  Assessed for Heart & Vascular TOC clinic readiness.  Unfortunately at this time the patient does not meet criteria due to renal failure - nephrology consulted. SCr >4.  Navigator available for reassessment of patient.   Kerby Nora, PharmD, BCPS Heart Failure Stewardship Pharmacist Phone [phone]

## 2021-05-21 DIAGNOSIS — I50811 Acute right heart failure: Secondary | ICD-10-CM

## 2021-05-21 LAB — GLUCOSE, CAPILLARY
Glucose-Capillary: 288 mg/dL — ABNORMAL HIGH (ref 70–99)
Glucose-Capillary: 253 mg/dL — ABNORMAL HIGH (ref 70–99)
Glucose-Capillary: 229 mg/dL — ABNORMAL HIGH (ref 70–99)
Glucose-Capillary: 278 mg/dL — ABNORMAL HIGH (ref 70–99)
Glucose-Capillary: 244 mg/dL — ABNORMAL HIGH (ref 70–99)
Glucose-Capillary: 210 mg/dL — ABNORMAL HIGH (ref 70–99)

## 2021-05-21 LAB — RENAL FUNCTION PANEL
Albumin: 2.9 g/dL — ABNORMAL LOW (ref 3.5–5.0)
Anion gap: 11 (ref 5–15)
BUN: 80 mg/dL — ABNORMAL HIGH (ref 8–23)
CO2: 23 mmol/L (ref 22–32)
Calcium: 7.9 mg/dL — ABNORMAL LOW (ref 8.9–10.3)
Chloride: 97 mmol/L — ABNORMAL LOW (ref 98–111)
Creatinine, Ser: 4.14 mg/dL — ABNORMAL HIGH (ref 0.61–1.24)
GFR, Estimated: 13 mL/min — ABNORMAL LOW (ref >60)
Glucose, Bld: 344 mg/dL — ABNORMAL HIGH (ref 70–99)
Phosphorus: 6.7 mg/dL — ABNORMAL HIGH (ref 2.5–4.6)
Potassium: 4.7 mmol/L (ref 3.5–5.1)
Sodium: 131 mmol/L — ABNORMAL LOW (ref 135–145)

## 2021-05-21 LAB — HEPARIN LEVEL (UNFRACTIONATED): Heparin Unfractionated: 0.32 IU/mL (ref 0.30–0.70)

## 2021-05-21 LAB — CBC
HCT: 31.8 % — ABNORMAL LOW (ref 39.0–52.0)
Hemoglobin: 9.9 g/dL — ABNORMAL LOW (ref 13.0–17.0)
MCH: 31.5 pg (ref 26.0–34.0)
MCHC: 31.1 g/dL (ref 30.0–36.0)
MCV: 101.3 fL — ABNORMAL HIGH (ref 80.0–100.0)
Platelets: 150 10*3/uL (ref 150–400)
RBC: 3.14 MIL/uL — ABNORMAL LOW (ref 4.22–5.81)
RDW: 12.8 % (ref 11.5–15.5)
WBC: 6.6 10*3/uL (ref 4.0–10.5)
nRBC: 0 % (ref 0.0–0.2)

## 2021-05-21 MED ORDER — HEPARIN SODIUM (PORCINE) 5000 UNIT/ML IJ SOLN
5000.0000 [IU] | Freq: Three times a day (TID) | INTRAMUSCULAR | Status: DC
  Administered 2021-05-21 – 2021-05-30 (×28): 5000 [IU] via SUBCUTANEOUS
  Filled 2021-05-21 (×28): qty 1

## 2021-05-21 MED ORDER — FUROSEMIDE 10 MG/ML IJ SOLN
120.0000 mg | Freq: Every day | INTRAVENOUS | Status: DC
  Filled 2021-05-21: qty 12

## 2021-05-21 NOTE — Progress Notes (Signed)
Progress Note  Patient Name: [name] Date of Encounter: 05/21/2021  [facility] Cardiologist: None   Subjective   Feeling well.  Breathing is improving.  Denies chest pain.  Reports pain in his right shoulder.  Inpatient Medications    Scheduled Meds: . aspirin  81 mg Oral Daily  . atorvastatin  40 mg Oral QHS  . Chlorhexidine Gluconate Cloth  6 each Topical Daily  . gabapentin  300 mg Oral Q1400  . gabapentin  600 mg Oral BID AC & HS  . heparin injection (subcutaneous)  5,000 Units Subcutaneous Q8H  . insulin aspart  0-15 Units Subcutaneous TID WC  . ipratropium-albuterol  3 mL Nebulization TID  . levothyroxine  50 mcg Oral QAC breakfast  . metoprolol tartrate  12.5 mg Oral BID  . pantoprazole  40 mg Oral Daily   Continuous Infusions: . furosemide 120 mg (05/20/21 1743)   PRN Meds: acetaminophen, ipratropium-albuterol, naphazoline-pheniramine, nitroGLYCERIN, ondansetron (ZOFRAN) IV, traZODone   Vital Signs    Vitals:   05/21/21 0600 05/21/21 0700 05/21/21 0800 05/21/21 0803  BP: (!) 143/84 131/61 (!) 145/42   Pulse: (!) 54 (!) 59 70 83  Resp: 19 (!) 21 (!) 21 18  Temp:   98.2 F (36.8 C)   TempSrc:   Oral   SpO2: 93% 92% 97%   Weight:      Height:        Intake/Output Summary (Last 24 hours) at 05/21/2021 0901 Last data filed at 05/21/2021 0600 Gross per 24 hour  Intake 1556.29 ml  Output 3550 ml  Net -1993.71 ml   Last 3 Weights 05/21/2021 05/20/2021 05/19/2021  Weight (lbs) 206 lb 12.7 oz 192 lb 14.4 oz 198 lb 6.6 oz  Weight (kg) 93.8 kg 87.5 kg 90 kg      Telemetry    Sinus rhythm.  Frequent PVCs - Personally Reviewed  ECG    05/20/21: Sinus rhythm. Rate 62 bpm.  First degree AV block.  Frequent PVCs. Prior inferior infarct - Personally Reviewed  Physical Exam   VS:  BP (!) 145/42 (BP Location: Left Arm)   Pulse 83   Temp 98.2 F (36.8 C) (Oral)   Resp 18   Ht '5\' 4"'$  (1.626 m)   Wt 93.8 kg   SpO2 97%   BMI 35.50 kg/m  , BMI Body mass  index is 35.5 kg/m. GENERAL:  Well appearing HEENT: Pupils equal round and reactive, fundi not visualized, oral mucosa unremarkable NECK:  No jugular venous distention, waveform within normal limits, carotid upstroke brisk and symmetric, no bruits LUNGS:  Expiratory wheezing bilaterally HEART:  RRR.  PMI not displaced or sustained,S1 and S2 within normal limits, no S3, no S4, no clicks, no rubs, no murmurs ABD:  Flat, positive bowel sounds normal in frequency in pitch, no bruits, no rebound, no guarding, no midline pulsatile mass, no hepatomegaly, no splenomegaly EXT:  L BKA.  1+ R LE edema, no cyanosis no clubbing SKIN:  No rashes no nodules NEURO:  Cranial nerves II through XII grossly intact, motor grossly intact throughout [facility]:  Cognitively intact, oriented to person place and time  Labs    High Sensitivity Troponin:   Recent Labs  Lab 05/19/21 0610  TROPONINIHS 593*      Chemistry Recent Labs  Lab 05/19/21 0610 05/20/21 0130 05/21/21 0102  NA 135 134* 131*  K 4.8 4.7 4.7  CL 108 102 97*  CO2 18* 20* 23  GLUCOSE 40* 263* 344*  BUN 70* 74* 80*  CREATININE 3.89* 4.18* 4.14*  CALCIUM 8.3* 8.1* 7.9*  ALBUMIN  --   --  2.9*  GFRNONAA 15* 13* 13*  ANIONGAP '9 12 11     '$ Hematology Recent Labs  Lab 05/19/21 0610 05/20/21 0130 05/21/21 0102  WBC 7.8 6.8 6.6  RBC 3.29* 3.24* 3.14*  HGB 10.2* 10.1* 9.9*  HCT 33.3* 32.8* 31.8*  MCV 101.2* 101.2* 101.3*  MCH 31.0 31.2 31.5  MCHC 30.6 30.8 31.1  RDW 13.2 12.8 12.8  PLT 187 176 150    BNP Recent Labs  Lab 05/19/21 1422  BNP 799.7*     DDimer  Recent Labs  Lab 05/20/21 1015  DDIMER 1.82*     Radiology    US RENAL  Result Date: 05/19/2021 CLINICAL DATA:  Stage IV CKD, acute renal failure. EXAM: RENAL / URINARY TRACT ULTRASOUND COMPLETE COMPARISON:  Renal ultrasound February 2019 FINDINGS: Right Kidney: Renal measurements: 11.2 x 5.2 x 4.9 cm = volume: 148 mL. Echogenicity within normal limits. No mass or  hydronephrosis visualized. Left Kidney: Renal measurements: 12 x 5.7 x 4.1 cm = volume: 146 mL. Echogenicity within normal limits. No mass or hydronephrosis visualized. Bladder: Appears normal for degree of bladder distention. Other: None. IMPRESSION: No hydronephrosis. Electronically Signed   By: Dahlia Bailiff MD   On: 05/19/2021 15:51   DG CHEST PORT 1 VIEW  Result Date: 05/19/2021 CLINICAL DATA:  Shortness of breath EXAM: PORTABLE CHEST 1 VIEW COMPARISON:  May 17, 2021 FINDINGS: There is overall somewhat less interstitial and patchy alveolar pulmonary edema. Moderate edema remains, with the greatest degree of airspace opacity in the left base currently. There is cardiomegaly with pulmonary venous hypertension. No adenopathy. Patient is status post coronary artery bypass grafting. There is aortic atherosclerosis. No bone lesions IMPRESSION: Cardiomegaly with pulmonary vascular congestion. Pulmonary edema remains, although there has been partial clearing of pulmonary edema compared to 2 days prior. Suspect alveolar edema in the left base region, although pneumonia in this area could present in this manner. Both pneumonia and edema may be present concurrently. Status post coronary artery bypass grafting. Aortic Atherosclerosis (ICD10-I70.0). Electronically Signed   By: Lowella Grip III M.D.   On: 05/19/2021 14:04   ECHOCARDIOGRAM COMPLETE  Result Date: 05/19/2021    ECHOCARDIOGRAM REPORT   Patient Name:   [name] Date of Exam: 05/19/2021 Medical Rec #:  [id number]        Height:       64.0 in Accession #:    [id number]       Weight:       198.4 lb Date of Birth:  06-Oct-1935        BSA:          1.950 m Patient Age:    86 years         BP:           127/57 mmHg Patient Gender: M                HR:           54 bpm. Exam Location:  Inpatient Procedure: 2D Echo, Color Doppler and Cardiac Doppler Indications:    NSTEMI i21.4  History:        Patient has prior history of Echocardiogram examinations,  most                 recent 06/08/2018. CHF, Prior CABG; Risk Factors:Hypertension,  Diabetes and Dyslipidemia.  Sonographer:    Raquel Sarna Senior RDCS Referring Phys: [id number] [place]  1. Left ventricular ejection fraction, by estimation, is 50 to 55%. The left ventricle has low normal function. The left ventricle demonstrates regional wall motion abnormalities (see scoring diagram/findings for description). There is mild left ventricular hypertrophy. Left ventricular diastolic parameters are indeterminate.  2. Right ventricular systolic function is moderately reduced. The right ventricular size is moderately enlarged. There is severely elevated pulmonary artery systolic pressure. The estimated right ventricular systolic pressure is 123XX123 mmHg.  3. Left atrial size was mildly dilated.  4. The mitral valve is abnormal. Trivial mitral valve regurgitation. Severe mitral annular calcification.  5. The aortic valve is tricuspid. There is mild calcification of the aortic valve. Aortic valve regurgitation is not visualized. Mild to moderate aortic valve sclerosis/calcification is present, without any evidence of aortic stenosis. Aortic valve mean  gradient measures 5.0 mmHg.  6. The inferior vena cava is normal in size with <50% respiratory variability, suggesting right atrial pressure of 8 mmHg. FINDINGS  Left Ventricle: Left ventricular ejection fraction, by estimation, is 50 to 55%. The left ventricle has low normal function. The left ventricle demonstrates regional wall motion abnormalities. The left ventricular internal cavity size was normal in size. There is mild left ventricular hypertrophy. Left ventricular diastolic parameters are indeterminate.  LV Wall Scoring: The basal inferolateral segment and basal inferior segment are hypokinetic. Right Ventricle: The right ventricular size is moderately enlarged. No increase in right ventricular wall thickness. Right ventricular systolic  function is moderately reduced. There is severely elevated pulmonary artery systolic pressure. The tricuspid regurgitant velocity is 3.83 m/s, and with an assumed right atrial pressure of 8 mmHg, the estimated right ventricular systolic pressure is 123XX123 mmHg. Left Atrium: Left atrial size was mildly dilated. Right Atrium: Right atrial size was normal in size. Pericardium: There is no evidence of pericardial effusion. Mitral Valve: The mitral valve is abnormal. Severe mitral annular calcification. Trivial mitral valve regurgitation. Tricuspid Valve: The tricuspid valve is grossly normal. Tricuspid valve regurgitation is mild. Aortic Valve: The aortic valve is tricuspid. There is mild calcification of the aortic valve. There is moderate aortic valve annular calcification. Aortic valve regurgitation is not visualized. Mild to moderate aortic valve sclerosis/calcification is present, without any evidence of aortic stenosis. Aortic valve mean gradient measures 5.0 mmHg. Aortic valve peak gradient measures 10.1 mmHg. Aortic valve area, by VTI measures 1.24 cm. Pulmonic Valve: The pulmonic valve was grossly normal. Pulmonic valve regurgitation is mild. Aorta: The aortic root is normal in size and structure. Venous: The inferior vena cava is normal in size with less than 50% respiratory variability, suggesting right atrial pressure of 8 mmHg. IAS/Shunts: No atrial level shunt detected by color flow Doppler.  LEFT VENTRICLE PLAX 2D LVIDd:         4.20 cm LVIDs:         3.70 cm LV PW:         1.10 cm LV IVS:        0.90 cm LVOT diam:     1.80 cm LV SV:         44 LV SV Index:   23 LVOT Area:     2.54 cm  RIGHT VENTRICLE RV S prime:     5.55 cm/s TAPSE (M-mode): 1.2 cm LEFT ATRIUM             Index       RIGHT ATRIUM  Index LA diam:        3.80 cm 1.95 cm/m  RA Area:     19.10 cm LA Vol (A2C):   80.0 ml 41.03 ml/m RA Volume:   55.80 ml  28.62 ml/m LA Vol (A4C):   53.7 ml 27.54 ml/m LA Biplane Vol: 67.9 ml 34.82  ml/m  AORTIC VALVE AV Area (Vmax):    1.17 cm AV Area (Vmean):   1.26 cm AV Area (VTI):     1.24 cm AV Vmax:           159.00 cm/s AV Vmean:          109.000 cm/s AV VTI:            0.356 m AV Peak Grad:      10.1 mmHg AV Mean Grad:      5.0 mmHg LVOT Vmax:         73.40 cm/s LVOT Vmean:        54.000 cm/s LVOT VTI:          0.173 m LVOT/AV VTI ratio: 0.49  AORTA Ao Asc diam: 3.20 cm TRICUSPID VALVE TR Peak grad:   58.7 mmHg TR Vmax:        383.00 cm/s  SHUNTS Systemic VTI:  0.17 m Systemic Diam: 1.80 cm Rozann Lesches MD Electronically signed by Rozann Lesches MD Signature Date/Time: 05/19/2021/12:57:31 PM    Final    VAS [place] LOWER EXTREMITY VENOUS (DVT)  Result Date: 05/20/2021  Lower Venous DVT Study Patient Name:  DIJUAN FRALEIGH  Date of Exam:   05/20/2021 Medical Rec #: [id number]         Accession #:    [id number] Date of Birth: March 22, 1935         Patient Gender: M Patient Age:   25Y Exam Location:  [facility] Procedure:      VAS [place] LOWER EXTREMITY VENOUS (DVT) Referring Phys: [id number] Kayden Hutmacher Nice --------------------------------------------------------------------------------  Indications: Swelling.  Anticoagulation: Heparin. Limitations: Poor ultrasound/tissue interface. Comparison Study: No prior studies. Extensive arterial history. Performing Technologist: Darlin Coco RDMS,RVT  Examination Guidelines: A complete evaluation includes B-mode imaging, spectral Doppler, color Doppler, and power Doppler as needed of all accessible portions of each vessel. Bilateral testing is considered an integral part of a complete examination. Limited examinations for reoccurring indications may be performed as noted. The reflux portion of the exam is performed with the patient in reverse Trendelenburg.  +---------+---------------+---------+-----------+----------+--------------+ RIGHT    CompressibilityPhasicitySpontaneityPropertiesThrombus Aging  +---------+---------------+---------+-----------+----------+--------------+ CFV      Full           Yes      Yes                                 +---------+---------------+---------+-----------+----------+--------------+ SFJ      Full                                                        +---------+---------------+---------+-----------+----------+--------------+ FV Prox  Full                                                        +---------+---------------+---------+-----------+----------+--------------+  FV Mid   Full                                                        +---------+---------------+---------+-----------+----------+--------------+ FV DistalFull                                                        +---------+---------------+---------+-----------+----------+--------------+ PFV      Full                                                        +---------+---------------+---------+-----------+----------+--------------+ POP      Full           Yes      Yes                                 +---------+---------------+---------+-----------+----------+--------------+ PTV      Full                                                        +---------+---------------+---------+-----------+----------+--------------+ PERO                    Yes      Yes                                 +---------+---------------+---------+-----------+----------+--------------+   +----+---------------+---------+-----------+----------+--------------+ LEFTCompressibilityPhasicitySpontaneityPropertiesThrombus Aging +----+---------------+---------+-----------+----------+--------------+ CFV Full           Yes      Yes                                 +----+---------------+---------+-----------+----------+--------------+    Summary: RIGHT: - There is no evidence of deep vein thrombosis in the lower extremity. However, portions of this examination were limited- see  technologist comments above.  - No cystic structure found in the popliteal fossa.  LEFT: - No evidence of common femoral vein obstruction.  *See table(s) above for measurements and observations. Electronically signed by Jamelle Haring on 05/20/2021 at 2:54:23 PM.    Final     Cardiac Studies   ECHO 05/19/2021 1. Left ventricular ejection fraction, by estimation, is 50 to 55%. The  left ventricle has low normal function. The left ventricle demonstrates  regional wall motion abnormalities (see scoring diagram/findings for  description). There is mild left  ventricular hypertrophy. Left ventricular diastolic parameters are  indeterminate.  2. Right ventricular systolic function is moderately reduced. The right  ventricular size is moderately enlarged. There is severely elevated  pulmonary artery systolic pressure. The estimated right ventricular  systolic pressure is 123XX123 mmHg.  3. Left atrial size was mildly dilated.  4. The mitral valve is  abnormal. Trivial mitral valve regurgitation.  Severe mitral annular calcification.  5. The aortic valve is tricuspid. There is mild calcification of the  aortic valve. Aortic valve regurgitation is not visualized. Mild to  moderate aortic valve sclerosis/calcification is present, without any  evidence of aortic stenosis. Aortic valve mean  gradient measures 5.0 mmHg.  6. The inferior vena cava is normal in size with <50% respiratory  variability, suggesting right atrial pressure of 8 mmHg.   Echocardiogram: 05/2018 Study Conclusions   - Left ventricle: The cavity size was normal. Wall thickness was  increased in a pattern of mild LVH. Systolic function was normal.  The estimated ejection fraction was in the range of 55% to 60%.  Wall motion was normal; there were no regional wall motion  abnormalities. Features are consistent with a pseudonormal left  ventricular filling pattern, with concomitant abnormal relaxation  and  increased filling pressure (grade 2 diastolic dysfunction).  - Aortic valve: Mildly calcified annulus. Mildly thickened, mildly  calcified leaflets.  - Mitral valve: Mildly calcified annulus. Moderately thickened,  moderately calcified leaflets .   Patient Profile     86 y.o. male with CAD status post CABG, PAD status post left-pop bypass 05/2017 and left BKA, hypertension, hyperlipidemia, diabetes, carotid stenosis, and stage IV CKD admitted with hypoxic respiratory failure, volume overload, and elevated troponin.  Assessment & Plan    # Acute diastolic heart failure:  # Pulmonary hypertension:  # Acute on chronic renal failure:   Left ventricular systolic function was relatively preserved on echo this admission.  Right ventricular function is reduced and pulmonary pressures are elevated.  proBNP was elevated to 10,000 at [place] and chest x-ray was consistent with heart failure.  He initially diuresed well with IV Lasix but then renal function worsened despite remaining volume overloaded.  He is not a candidate for hemodialysis and nephrology is now involved.  Yesterday he received a higher dose of Lasix and was net -2.9 L.  However renal function has increased from 3.9-4.2.  Nephrology is now following and assisting with diuresis.  He received 120 mg IV yesterday and is scheduled to get it twice daily today.  Renal function is stabilized around 4.1.  He was net -1.7 L yesterday.  He remains volume overloaded.  Appreciate nephrology assistance.  Given his elevated right-sided heart pressures there was concern for PE/DVT.  Right lower extremity Doppler was negative for DVT.  D-dimer was mildly elevated.  However he is not a good candidate for V/q. scan given his pulmonary edema and renal function prohibits him from getting contrast dye.  We will stop his IV heparin and switch him to prophylactic dosing.  # CAD s/p CABG:  # Elevated troponin: # Hyperlipidemia:  CABGin 2010 with LIMA-LAD,  SVG-D1, SVG-LCx and SVG-PDA.  High-sensitivity troponin was elevated to 937 at outside hospital.  Now downtrending.  He has not had chest pain.  Likely due to demand ischemia.  Not a candidate for cath in the setting of his renal dysfunction. Continue atorvastatin and stop IV heparin as above.  Continue metoprolol.  # PVCs:  Frequent PVCs on telemetry with ventricular bigeminy and NSVT.   Continue metoprolol.  # PAD: S/p L BKA.  Wheelchair bound.      For questions or updates, please contact [place] Please consult [email or web address] for contact info under        Signed, Skeet Latch, MD  05/21/2021, 9:01 AM

## 2021-05-21 NOTE — Plan of Care (Signed)

## 2021-05-21 NOTE — Care Management Important Message (Signed)
Important Message  Patient Details  Name: [name] MRN: [id number] Date of Birth: [birth year]   Medicare Important Message Given:  Yes     Orbie Pyo 05/21/2021, 2:28 PM

## 2021-05-21 NOTE — Progress Notes (Signed)
RT note. Patient currently on 3L [place], sat 99% with stable VS and no distress noted. BIPAP is not needed at this time, RT will continue to monitor.

## 2021-05-21 NOTE — Progress Notes (Signed)
Patient ID: TETSUO BUROW, male   DOB: 05/19/1935, 86 y.o.   MRN: [id number] S: Feeling better today. O:BP (!) 145/42 (BP Location: Left Arm)   Pulse 83   Temp 98.2 F (36.8 C) (Oral)   Resp 18   Ht '5\' 4"'$  (1.626 m)   Wt 93.8 kg   SpO2 97%   BMI 35.50 kg/m   Intake/Output Summary (Last 24 hours) at 05/21/2021 1013 Last data filed at 05/21/2021 0919 Gross per 24 hour  Intake 1556.29 ml  Output 3910 ml  Net -2353.71 ml   Intake/Output: I/O last 3 completed shifts: In: 2102.3 [P.O.:1461; I.V.:579.3; IV Piggyback:62] Out: [id number] [Urine:5250]  Intake/Output this shift:  Total I/O In: -  Out: 360 [Urine:360] Weight change: 6.3 kg Gen: NAD CVS: RRR Resp: CTA Abd: +BS, soft, TN/Nd Ext: 1+ pretibial edema of RLE, s/p LBKA without edema  Recent Labs  Lab 05/19/21 0610 05/20/21 0130 05/21/21 0102  NA 135 134* 131*  K 4.8 4.7 4.7  CL 108 102 97*  CO2 18* 20* 23  GLUCOSE 40* 263* 344*  BUN 70* 74* 80*  CREATININE 3.89* 4.18* 4.14*  ALBUMIN  --   --  2.9*  CALCIUM 8.3* 8.1* 7.9*  PHOS  --   --  6.7*   Liver Function Tests: Recent Labs  Lab 05/21/21 0102  ALBUMIN 2.9*   No results for input(s): LIPASE, AMYLASE in the last 168 hours. No results for input(s): AMMONIA in the last 168 hours. CBC: Recent Labs  Lab 05/19/21 0610 05/20/21 0130 05/21/21 0102  WBC 7.8 6.8 6.6  HGB 10.2* 10.1* 9.9*  HCT 33.3* 32.8* 31.8*  MCV 101.2* 101.2* 101.3*  PLT 187 176 150   Cardiac Enzymes: No results for input(s): CKTOTAL, CKMB, CKMBINDEX, TROPONINI in the last 168 hours. CBG: Recent Labs  Lab 05/20/21 1118 05/20/21 1534 05/20/21 2158 05/21/21 0547 05/21/21 0911  GLUCAP 179* 173* 252* 288* 253*    Iron Studies: No results for input(s): IRON, TIBC, TRANSFERRIN, FERRITIN in the last 72 hours. Studies/Results: US RENAL  Result Date: 05/19/2021 CLINICAL DATA:  Stage IV CKD, acute renal failure. EXAM: RENAL / URINARY TRACT ULTRASOUND COMPLETE COMPARISON:  Renal ultrasound  February 2019 FINDINGS: Right Kidney: Renal measurements: 11.2 x 5.2 x 4.9 cm = volume: 148 mL. Echogenicity within normal limits. No mass or hydronephrosis visualized. Left Kidney: Renal measurements: 12 x 5.7 x 4.1 cm = volume: 146 mL. Echogenicity within normal limits. No mass or hydronephrosis visualized. Bladder: Appears normal for degree of bladder distention. Other: None. IMPRESSION: No hydronephrosis. Electronically Signed   By: Dahlia Bailiff MD   On: 05/19/2021 15:51   DG CHEST PORT 1 VIEW  Result Date: 05/19/2021 CLINICAL DATA:  Shortness of breath EXAM: PORTABLE CHEST 1 VIEW COMPARISON:  May 17, 2021 FINDINGS: There is overall somewhat less interstitial and patchy alveolar pulmonary edema. Moderate edema remains, with the greatest degree of airspace opacity in the left base currently. There is cardiomegaly with pulmonary venous hypertension. No adenopathy. Patient is status post coronary artery bypass grafting. There is aortic atherosclerosis. No bone lesions IMPRESSION: Cardiomegaly with pulmonary vascular congestion. Pulmonary edema remains, although there has been partial clearing of pulmonary edema compared to 2 days prior. Suspect alveolar edema in the left base region, although pneumonia in this area could present in this manner. Both pneumonia and edema may be present concurrently. Status post coronary artery bypass grafting. Aortic Atherosclerosis (ICD10-I70.0). Electronically Signed   By: Lowella Grip III M.D.  On: 05/19/2021 14:04   ECHOCARDIOGRAM COMPLETE  Result Date: 05/19/2021    ECHOCARDIOGRAM REPORT   Patient Name:   QUAID GOCHNAUER Date of Exam: 05/19/2021 Medical Rec #:  [id number]        Height:       64.0 in Accession #:    [id number]       Weight:       198.4 lb Date of Birth:  1935/04/27        BSA:          1.950 m Patient Age:    86 years         BP:           127/57 mmHg Patient Gender: M                HR:           54 bpm. Exam Location:  Inpatient Procedure: 2D Echo,  Color Doppler and Cardiac Doppler Indications:    NSTEMI i21.4  History:        Patient has prior history of Echocardiogram examinations, most                 recent 06/08/2018. CHF, Prior CABG; Risk Factors:Hypertension,                 Diabetes and Dyslipidemia.  Sonographer:    Raquel Sarna Senior RDCS Referring Phys: [id number] [place]  1. Left ventricular ejection fraction, by estimation, is 50 to 55%. The left ventricle has low normal function. The left ventricle demonstrates regional wall motion abnormalities (see scoring diagram/findings for description). There is mild left ventricular hypertrophy. Left ventricular diastolic parameters are indeterminate.  2. Right ventricular systolic function is moderately reduced. The right ventricular size is moderately enlarged. There is severely elevated pulmonary artery systolic pressure. The estimated right ventricular systolic pressure is 123XX123 mmHg.  3. Left atrial size was mildly dilated.  4. The mitral valve is abnormal. Trivial mitral valve regurgitation. Severe mitral annular calcification.  5. The aortic valve is tricuspid. There is mild calcification of the aortic valve. Aortic valve regurgitation is not visualized. Mild to moderate aortic valve sclerosis/calcification is present, without any evidence of aortic stenosis. Aortic valve mean  gradient measures 5.0 mmHg.  6. The inferior vena cava is normal in size with <50% respiratory variability, suggesting right atrial pressure of 8 mmHg. FINDINGS  Left Ventricle: Left ventricular ejection fraction, by estimation, is 50 to 55%. The left ventricle has low normal function. The left ventricle demonstrates regional wall motion abnormalities. The left ventricular internal cavity size was normal in size. There is mild left ventricular hypertrophy. Left ventricular diastolic parameters are indeterminate.  LV Wall Scoring: The basal inferolateral segment and basal inferior segment are hypokinetic. Right  Ventricle: The right ventricular size is moderately enlarged. No increase in right ventricular wall thickness. Right ventricular systolic function is moderately reduced. There is severely elevated pulmonary artery systolic pressure. The tricuspid regurgitant velocity is 3.83 m/s, and with an assumed right atrial pressure of 8 mmHg, the estimated right ventricular systolic pressure is 123XX123 mmHg. Left Atrium: Left atrial size was mildly dilated. Right Atrium: Right atrial size was normal in size. Pericardium: There is no evidence of pericardial effusion. Mitral Valve: The mitral valve is abnormal. Severe mitral annular calcification. Trivial mitral valve regurgitation. Tricuspid Valve: The tricuspid valve is grossly normal. Tricuspid valve regurgitation is mild. Aortic Valve: The aortic valve is tricuspid. There is mild calcification of the  aortic valve. There is moderate aortic valve annular calcification. Aortic valve regurgitation is not visualized. Mild to moderate aortic valve sclerosis/calcification is present, without any evidence of aortic stenosis. Aortic valve mean gradient measures 5.0 mmHg. Aortic valve peak gradient measures 10.1 mmHg. Aortic valve area, by VTI measures 1.24 cm. Pulmonic Valve: The pulmonic valve was grossly normal. Pulmonic valve regurgitation is mild. Aorta: The aortic root is normal in size and structure. Venous: The inferior vena cava is normal in size with less than 50% respiratory variability, suggesting right atrial pressure of 8 mmHg. IAS/Shunts: No atrial level shunt detected by color flow Doppler.  LEFT VENTRICLE PLAX 2D LVIDd:         4.20 cm LVIDs:         3.70 cm LV PW:         1.10 cm LV IVS:        0.90 cm LVOT diam:     1.80 cm LV SV:         44 LV SV Index:   23 LVOT Area:     2.54 cm  RIGHT VENTRICLE RV S prime:     5.55 cm/s TAPSE (M-mode): 1.2 cm LEFT ATRIUM             Index       RIGHT ATRIUM           Index LA diam:        3.80 cm 1.95 cm/m  RA Area:     19.10  cm LA Vol (A2C):   80.0 ml 41.03 ml/m RA Volume:   55.80 ml  28.62 ml/m LA Vol (A4C):   53.7 ml 27.54 ml/m LA Biplane Vol: 67.9 ml 34.82 ml/m  AORTIC VALVE AV Area (Vmax):    1.17 cm AV Area (Vmean):   1.26 cm AV Area (VTI):     1.24 cm AV Vmax:           159.00 cm/s AV Vmean:          109.000 cm/s AV VTI:            0.356 m AV Peak Grad:      10.1 mmHg AV Mean Grad:      5.0 mmHg LVOT Vmax:         73.40 cm/s LVOT Vmean:        54.000 cm/s LVOT VTI:          0.173 m LVOT/AV VTI ratio: 0.49  AORTA Ao Asc diam: 3.20 cm TRICUSPID VALVE TR Peak grad:   58.7 mmHg TR Vmax:        383.00 cm/s  SHUNTS Systemic VTI:  0.17 m Systemic Diam: 1.80 cm Rozann Lesches MD Electronically signed by Rozann Lesches MD Signature Date/Time: 05/19/2021/12:57:31 PM    Final    VAS [place] LOWER EXTREMITY VENOUS (DVT)  Result Date: 05/20/2021  Lower Venous DVT Study Patient Name:  CASSIE FINCKE  Date of Exam:   05/20/2021 Medical Rec #: [id number]         Accession #:    [id number] Date of Birth: 10/29/1935         Patient Gender: M Patient Age:   55Y Exam Location:  [facility] Procedure:      VAS [place] LOWER EXTREMITY VENOUS (DVT) Referring Phys: [id number] TIFFANY [place] --------------------------------------------------------------------------------  Indications: Swelling.  Anticoagulation: Heparin. Limitations: Poor ultrasound/tissue interface. Comparison Study: No prior studies. Extensive arterial history. Performing Technologist: Darlin Coco RDMS,RVT  Examination Guidelines: A  complete evaluation includes B-mode imaging, spectral Doppler, color Doppler, and power Doppler as needed of all accessible portions of each vessel. Bilateral testing is considered an integral part of a complete examination. Limited examinations for reoccurring indications may be performed as noted. The reflux portion of the exam is performed with the patient in reverse Trendelenburg.   +---------+---------------+---------+-----------+----------+--------------+ RIGHT    CompressibilityPhasicitySpontaneityPropertiesThrombus Aging +---------+---------------+---------+-----------+----------+--------------+ CFV      Full           Yes      Yes                                 +---------+---------------+---------+-----------+----------+--------------+ SFJ      Full                                                        +---------+---------------+---------+-----------+----------+--------------+ FV Prox  Full                                                        +---------+---------------+---------+-----------+----------+--------------+ FV Mid   Full                                                        +---------+---------------+---------+-----------+----------+--------------+ FV DistalFull                                                        +---------+---------------+---------+-----------+----------+--------------+ PFV      Full                                                        +---------+---------------+---------+-----------+----------+--------------+ POP      Full           Yes      Yes                                 +---------+---------------+---------+-----------+----------+--------------+ PTV      Full                                                        +---------+---------------+---------+-----------+----------+--------------+ PERO                    Yes      Yes                                 +---------+---------------+---------+-----------+----------+--------------+   +----+---------------+---------+-----------+----------+--------------+  LEFTCompressibilityPhasicitySpontaneityPropertiesThrombus Aging +----+---------------+---------+-----------+----------+--------------+ CFV Full           Yes      Yes                                  +----+---------------+---------+-----------+----------+--------------+    Summary: RIGHT: - There is no evidence of deep vein thrombosis in the lower extremity. However, portions of this examination were limited- see technologist comments above.  - No cystic structure found in the popliteal fossa.  LEFT: - No evidence of common femoral vein obstruction.  *See table(s) above for measurements and observations. Electronically signed by Jamelle Haring on 05/20/2021 at 2:54:23 PM.    Final    . aspirin  81 mg Oral Daily  . atorvastatin  40 mg Oral QHS  . Chlorhexidine Gluconate Cloth  6 each Topical Daily  . gabapentin  300 mg Oral Q1400  . gabapentin  600 mg Oral BID AC & HS  . heparin injection (subcutaneous)  5,000 Units Subcutaneous Q8H  . insulin aspart  0-15 Units Subcutaneous TID WC  . ipratropium-albuterol  3 mL Nebulization TID  . levothyroxine  50 mcg Oral QAC breakfast  . metoprolol tartrate  12.5 mg Oral BID  . pantoprazole  40 mg Oral Daily    BMET    Component Value Date/Time   NA 131 (L) 05/21/2021 0102   K 4.7 05/21/2021 0102   CL 97 (L) 05/21/2021 0102   CO2 23 05/21/2021 0102   GLUCOSE 344 (H) 05/21/2021 0102   BUN 80 (H) 05/21/2021 0102   CREATININE 4.14 (H) 05/21/2021 0102   CALCIUM 7.9 (L) 05/21/2021 0102   GFRNONAA 13 (L) 05/21/2021 0102   GFRAA 21 (L) 11/27/2019 0633   CBC    Component Value Date/Time   WBC 6.6 05/21/2021 0102   RBC 3.14 (L) 05/21/2021 0102   HGB 9.9 (L) 05/21/2021 0102   HCT 31.8 (L) 05/21/2021 0102   PLT 150 05/21/2021 0102   MCV 101.3 (H) 05/21/2021 0102   MCH 31.5 05/21/2021 0102   MCHC 31.1 05/21/2021 0102   RDW 12.8 05/21/2021 0102   LYMPHSABS 1.5 11/25/2019 1731   MONOABS 0.5 11/25/2019 1731   EOSABS 0.4 11/25/2019 1731   BASOSABS 0.0 11/25/2019 1731      Assessment/Plan:  1. AKI/CKD stage IV - in setting of acute on chronic diastolic CHF and escalating diuretics consistent with cardiorenal syndrome.  His Scr increased from  3.89 to 4.18 after UOP of 3.9 liters with lasix 120 mg IV tid.  It was decreased to bid on 05/20/21 but still with 3.5 liters of UOP. 1. Will decrease frequency to daily given brisk diuresis and rising BUN/Cr.   2. He is not a suitable candidate for dialysis given his poor functional status and multiple comorbidities.  Per Dr. Jonnie Finner, he and his daughter are not in favor of dialysis and will continue to treat conservatively. 2. Acute on chronic diastolic CHF - responded to IV lasix 120 mg tid with 3.9 liters but rise in BUN/Cr.  Will decrease frequency to daily and follow.  He was on lasix 40 mg daily but may require switch to torsemide at time of discharge.  He is not sure if he was taking the furosemide regularly "I've been having trouble remembering things".  3. Anemia of CKD stage IV - follow H/H, iron stores.  Hold off on ESA for now.  4. HTN - stable 5.  IDDM - per primary   Donetta Potts, MD [facility] [phone]

## 2021-05-21 NOTE — Plan of Care (Signed)
  Problem: Education: Goal: Knowledge of General Education information will improve Description: Including pain rating scale, medication(s)/side effects and non-pharmacologic comfort measures Outcome: Progressing   Problem: Health Behavior/Discharge Planning: Goal: Ability to manage health-related needs will improve Outcome: Progressing   Problem: Clinical Measurements: Goal: Ability to maintain clinical measurements within normal limits will improve Outcome: Progressing   Problem: Clinical Measurements: Goal: Diagnostic test results will improve Outcome: Progressing   Problem: Clinical Measurements: Goal: Cardiovascular complication will be avoided Outcome: Progressing   Problem: Nutrition: Goal: Adequate nutrition will be maintained Outcome: Progressing   Problem: Elimination: Goal: Will not experience complications related to urinary retention Outcome: Progressing   Problem: Pain Managment: Goal: General experience of comfort will improve Outcome: Progressing   Problem: Skin Integrity: Goal: Risk for impaired skin integrity will decrease Outcome: Progressing   Problem: Education: Goal: Ability to verbalize understanding of medication therapies will improve Outcome: Progressing

## 2021-05-21 NOTE — Progress Notes (Signed)
Inpatient Diabetes Program Recommendations  AACE/ADA: New Consensus Statement on Inpatient Glycemic Control (2015)  Target Ranges:  Prepandial:   less than 140 mg/dL      Peak postprandial:   less than 180 mg/dL (1-2 hours)      Critically ill patients:  140 - 180 mg/dL   Lab Results  Component Value Date   GLUCAP 288 (H) 05/21/2021   HGBA1C 7.7 (H) 05/18/2021    Review of Glycemic Control Results for [name], [name] (MRN [id number]) as of 05/21/2021 09:22  Ref. Range 05/20/2021 06:18 05/20/2021 11:18 05/20/2021 15:34 05/20/2021 21:58 05/21/2021 05:47  Glucose-Capillary Latest Ref Range: 70 - 99 mg/dL 196 (H) 179 (H) 173 (H) 252 (H) 288 (H)   Diabetes history:  DM2 Outpatient Diabetes medications:  Lantus 40 units QAM, 25 units QHS, Humalog 5-10 units TID Current orders for Inpatient glycemic control:  Novolog 0-15 units TID  Inpatient Diabetes Program Recommendations:     Lantus 15 units daily  Will continue to follow while inpatient.  Thank you, Reche Dixon, RN, BSN Diabetes Coordinator Inpatient Diabetes Program [phone] (team pager from 8a-5p)

## 2021-05-21 NOTE — Evaluation (Signed)
Physical Therapy Evaluation Patient Details Name: [name] MRN: [id number] DOB: 07-16-1935 Today's Date: 05/21/2021   History of Present Illness  86 yo admitted 6/4 with SOb with respiratory failure and volume overload with CHf exacerbation. PMhx: CAD s/p CABG, HTN, HLD, PAD, Lt BKA, CKD, DM  Clinical Impression  Pt pleasant and very willing to get OOB. Pt lives alone and normally performs squat pivots to transfer between surfaces and performs all ADLs from [facility] level with assist of family for housework. Pt currently with decreased strength, transfers and respiratory function who will benefit from acute therapy to maximize safety and independence. Granddaughter plans to stay with pt at D/C.   Pt on 3L at 95% with drop to 85% during transfer with notable wheezing HR 64    Follow Up Recommendations Home health PT;Supervision/Assistance - 24 hour    Equipment Recommendations  None recommended by PT    Recommendations for Other Services       Precautions / Restrictions Precautions Precautions: Fall Precaution Comments: watch sats, left BKA      Mobility  Bed Mobility Overal bed mobility: Needs Assistance Bed Mobility: Supine to Sit     Supine to sit: HOB elevated;Min assist     General bed mobility comments: min HHA with increased time and HOB 30 degrees    Transfers Overall transfer level: Needs assistance   Transfers: Squat Pivot Transfers     Squat pivot transfers: Min assist     General transfer comment: min assist to rise from bed and fully pivot to chair with increased struggle and time from baseline  Ambulation/Gait                Stairs            Wheelchair Mobility    Modified Rankin (Stroke Patients Only)       Balance Overall balance assessment: Needs assistance   Sitting balance-Leahy Scale: Fair                                       Pertinent Vitals/Pain Pain Assessment: 0-10 Pain Score: 4  Pain  Location: right knee pain Pain Descriptors / Indicators: Aching Pain Intervention(s): Limited activity within patient's tolerance;Monitored during session;Repositioned    Home Living Family/patient expects to be discharged to:: Private residence Living Arrangements: Alone Available Help at Discharge: Family;Available 24 hours/day Type of Home: Mobile home Home Access: Ramped entrance     Home Layout: One level Home Equipment: [place] - 2 wheels;Wheelchair - manual;Shower seat      Prior Function Level of Independence: Independent with assistive device(s)         Comments: functions from [place] level and performs squat pivots between surfaces     Hand Dominance        Extremity/Trunk Assessment   Upper Extremity Assessment Upper Extremity Assessment: Generalized weakness    Lower Extremity Assessment Lower Extremity Assessment: Generalized weakness;LLE deficits/detail LLE Deficits / Details: BKA       Communication   Communication: No difficulties  Cognition Arousal/Alertness: Awake/alert Behavior During Therapy: WFL for tasks assessed/performed Overall Cognitive Status: Within Functional Limits for tasks assessed                                        General Comments  Exercises General Exercises - Lower Extremity Long Arc Quad: AROM;Right;Seated;10 reps   Assessment/Plan    PT Assessment Patient needs continued PT services  PT Problem List Decreased strength;Decreased mobility;Decreased activity tolerance;Decreased balance;Decreased knowledge of use of DME;Pain;Cardiopulmonary status limiting activity       PT Treatment Interventions DME instruction;Therapeutic exercise;Functional mobility training;Therapeutic activities;Patient/family education    PT Goals (Current goals can be found in the Care Plan section)  Acute Rehab PT Goals Patient Stated Goal: return home, fish PT Goal Formulation: With patient/family Time For Goal  Achievement: 06/04/21 Potential to Achieve Goals: Good    Frequency Min 3X/week   Barriers to discharge        Co-evaluation               AM-PAC PT "6 Clicks" Mobility  Outcome Measure Help needed turning from your back to your side while in a flat bed without using bedrails?: A Little Help needed moving from lying on your back to sitting on the side of a flat bed without using bedrails?: A Little Help needed moving to and from a bed to a chair (including a wheelchair)?: A Little Help needed standing up from a chair using your arms (e.g., wheelchair or bedside chair)?: A Lot Help needed to walk in hospital room?: Total Help needed climbing 3-5 steps with a railing? : Total 6 Click Score: 13    End of Session Equipment Utilized During Treatment: Gait belt;Oxygen Activity Tolerance: Patient tolerated treatment well Patient left: in chair;with call bell/phone within reach;with chair alarm set;with nursing/sitter in room;with family/visitor present Nurse Communication: Mobility status;Precautions PT Visit Diagnosis: Other abnormalities of gait and mobility (R26.89);Muscle weakness (generalized) (M62.81)    Time: 1315-1340 PT Time Calculation (min) (ACUTE ONLY): 25 min   Charges:   PT Evaluation $PT Eval Moderate Complexity: 1 Mod PT Treatments $Therapeutic Activity: 8-22 mins        Obed Samek P, PT Acute Rehabilitation Services Pager: [phone] Office: [place] 05/21/2021, 2:22 PM

## 2021-05-22 ENCOUNTER — Inpatient Hospital Stay: Payer: Self-pay

## 2021-05-22 ENCOUNTER — Inpatient Hospital Stay (HOSPITAL_COMMUNITY): Payer: Medicare HMO

## 2021-05-22 ENCOUNTER — Inpatient Hospital Stay (HOSPITAL_COMMUNITY): Payer: Medicare HMO | Admitting: Certified Registered Nurse Anesthetist

## 2021-05-22 ENCOUNTER — Inpatient Hospital Stay (HOSPITAL_COMMUNITY)
Admission: AD | Disposition: A | Discharge status: Hospice | Payer: Self-pay | Source: Other Acute Inpatient Hospital | Attending: Cardiovascular Disease

## 2021-05-22 DIAGNOSIS — I2510 Atherosclerotic heart disease of native coronary artery without angina pectoris: Secondary | ICD-10-CM

## 2021-05-22 DIAGNOSIS — R778 Other specified abnormalities of plasma proteins: Secondary | ICD-10-CM

## 2021-05-22 DIAGNOSIS — R001 Bradycardia, unspecified: Secondary | ICD-10-CM

## 2021-05-22 DIAGNOSIS — I462 Cardiac arrest due to underlying cardiac condition: Secondary | ICD-10-CM

## 2021-05-22 DIAGNOSIS — I469 Cardiac arrest, cause unspecified: Secondary | ICD-10-CM

## 2021-05-22 HISTORY — PX: LEFT HEART CATH AND CORONARY ANGIOGRAPHY: CATH118249

## 2021-05-22 LAB — CBC
HCT: 31.8 % — ABNORMAL LOW (ref 39.0–52.0)
HCT: 34.7 % — ABNORMAL LOW (ref 39.0–52.0)
Hemoglobin: 9.8 g/dL — ABNORMAL LOW (ref 13.0–17.0)
Hemoglobin: 10.8 g/dL — ABNORMAL LOW (ref 13.0–17.0)
MCH: 31.3 pg (ref 26.0–34.0)
MCH: 31.7 pg (ref 26.0–34.0)
MCHC: 30.8 g/dL (ref 30.0–36.0)
MCHC: 31.1 g/dL (ref 30.0–36.0)
MCV: 101.6 fL — ABNORMAL HIGH (ref 80.0–100.0)
MCV: 101.8 fL — ABNORMAL HIGH (ref 80.0–100.0)
Platelets: 158 10*3/uL (ref 150–400)
Platelets: 182 10*3/uL (ref 150–400)
RBC: 3.13 MIL/uL — ABNORMAL LOW (ref 4.22–5.81)
RBC: 3.41 MIL/uL — ABNORMAL LOW (ref 4.22–5.81)
RDW: 12.7 % (ref 11.5–15.5)
RDW: 12.8 % (ref 11.5–15.5)
WBC: 6.2 10*3/uL (ref 4.0–10.5)
WBC: 13.9 10*3/uL — ABNORMAL HIGH (ref 4.0–10.5)
nRBC: 0 % (ref 0.0–0.2)
nRBC: 0.1 % (ref 0.0–0.2)

## 2021-05-22 LAB — POCT I-STAT 7, (LYTES, BLD GAS, ICA,H+H)
Acid-base deficit: 1 mmol/L (ref 0.0–2.0)
Bicarbonate: 25.9 mmol/L (ref 20.0–28.0)
Calcium, Ion: 1.13 mmol/L — ABNORMAL LOW (ref 1.15–1.40)
HCT: 29 % — ABNORMAL LOW (ref 39.0–52.0)
Hemoglobin: 9.9 g/dL — ABNORMAL LOW (ref 13.0–17.0)
O2 Saturation: 100 %
Patient temperature: 36.8
Potassium: 4.2 mmol/L (ref 3.5–5.1)
Sodium: 133 mmol/L — ABNORMAL LOW (ref 135–145)
TCO2: 27 mmol/L (ref 22–32)
pCO2 arterial: 50.2 mmHg — ABNORMAL HIGH (ref 32.0–48.0)
pH, Arterial: 7.32 — ABNORMAL LOW (ref 7.350–7.450)
pO2, Arterial: 460 mmHg — ABNORMAL HIGH (ref 83.0–108.0)

## 2021-05-22 LAB — RENAL FUNCTION PANEL
Albumin: 3.2 g/dL — ABNORMAL LOW (ref 3.5–5.0)
Anion gap: 14 (ref 5–15)
BUN: 85 mg/dL — ABNORMAL HIGH (ref 8–23)
CO2: 21 mmol/L — ABNORMAL LOW (ref 22–32)
Calcium: 8.2 mg/dL — ABNORMAL LOW (ref 8.9–10.3)
Chloride: 97 mmol/L — ABNORMAL LOW (ref 98–111)
Creatinine, Ser: 3.81 mg/dL — ABNORMAL HIGH (ref 0.61–1.24)
GFR, Estimated: 15 mL/min — ABNORMAL LOW (ref >60)
Glucose, Bld: 226 mg/dL — ABNORMAL HIGH (ref 70–99)
Phosphorus: 6.7 mg/dL — ABNORMAL HIGH (ref 2.5–4.6)
Potassium: 4.8 mmol/L (ref 3.5–5.1)
Sodium: 132 mmol/L — ABNORMAL LOW (ref 135–145)

## 2021-05-22 LAB — GLUCOSE, CAPILLARY
Glucose-Capillary: 306 mg/dL — ABNORMAL HIGH (ref 70–99)
Glucose-Capillary: 269 mg/dL — ABNORMAL HIGH (ref 70–99)
Glucose-Capillary: 260 mg/dL — ABNORMAL HIGH (ref 70–99)
Glucose-Capillary: 245 mg/dL — ABNORMAL HIGH (ref 70–99)
Glucose-Capillary: 236 mg/dL — ABNORMAL HIGH (ref 70–99)

## 2021-05-22 LAB — COMPREHENSIVE METABOLIC PANEL
ALT: 42 U/L (ref 0–44)
AST: 50 U/L — ABNORMAL HIGH (ref 15–41)
Albumin: 3.2 g/dL — ABNORMAL LOW (ref 3.5–5.0)
Alkaline Phosphatase: 71 U/L (ref 38–126)
Anion gap: 15 (ref 5–15)
BUN: 88 mg/dL — ABNORMAL HIGH (ref 8–23)
CO2: 21 mmol/L — ABNORMAL LOW (ref 22–32)
Calcium: 8.4 mg/dL — ABNORMAL LOW (ref 8.9–10.3)
Chloride: 96 mmol/L — ABNORMAL LOW (ref 98–111)
Creatinine, Ser: 3.96 mg/dL — ABNORMAL HIGH (ref 0.61–1.24)
GFR, Estimated: 14 mL/min — ABNORMAL LOW (ref >60)
Glucose, Bld: 332 mg/dL — ABNORMAL HIGH (ref 70–99)
Potassium: 4.4 mmol/L (ref 3.5–5.1)
Sodium: 132 mmol/L — ABNORMAL LOW (ref 135–145)
Total Bilirubin: 0.7 mg/dL (ref 0.3–1.2)
Total Protein: 6.6 g/dL (ref 6.5–8.1)

## 2021-05-22 LAB — LACTIC ACID, PLASMA
Lactic Acid, Venous: 3.6 mmol/L (ref 0.5–1.9)
Lactic Acid, Venous: 2.4 mmol/L (ref 0.5–1.9)

## 2021-05-22 LAB — COOXEMETRY PANEL
Carboxyhemoglobin: 0 % — ABNORMAL LOW (ref 0.5–1.5)
Methemoglobin: 1 % (ref 0.0–1.5)
O2 Saturation: 59.1 %
Total hemoglobin: 8.8 g/dL — ABNORMAL LOW (ref 12.0–16.0)

## 2021-05-22 LAB — ECHOCARDIOGRAM COMPLETE
Height: 64 in
S' Lateral: 3.3 cm
Weight: 2987.67 oz

## 2021-05-22 LAB — MAGNESIUM: Magnesium: 2.2 mg/dL (ref 1.7–2.4)

## 2021-05-22 LAB — TROPONIN I (HIGH SENSITIVITY)
Troponin I (High Sensitivity): 155 ng/L (ref <18)
Troponin I (High Sensitivity): 125 ng/L (ref <18)

## 2021-05-22 SURGERY — LEFT HEART CATH AND CORONARY ANGIOGRAPHY
Anesthesia: LOCAL

## 2021-05-22 MED ORDER — NOREPINEPHRINE 4 MG/250ML-% IV SOLN
INTRAVENOUS | Status: AC
  Filled 2021-05-22: qty 250

## 2021-05-22 MED ORDER — VERAPAMIL HCL 2.5 MG/ML IV SOLN
INTRAVENOUS | Status: AC
  Filled 2021-05-22: qty 2

## 2021-05-22 MED ORDER — LIDOCAINE HCL (PF) 1 % IJ SOLN
INTRAMUSCULAR | Status: AC
  Filled 2021-05-22: qty 30

## 2021-05-22 MED ORDER — FENTANYL CITRATE (PF) 100 MCG/2ML IJ SOLN: 25.0000 ug | INTRAMUSCULAR | Status: DC | PRN

## 2021-05-22 MED ORDER — MIDAZOLAM HCL 2 MG/2ML IJ SOLN
INTRAMUSCULAR | Status: AC
  Filled 2021-05-22: qty 2

## 2021-05-22 MED ORDER — ORAL CARE MOUTH RINSE
15.0000 mL | OROMUCOSAL | Status: DC
  Administered 2021-05-22 (×5): 15 mL via OROMUCOSAL

## 2021-05-22 MED ORDER — FENTANYL CITRATE (PF) 100 MCG/2ML IJ SOLN
INTRAMUSCULAR | Status: DC | PRN
  Administered 2021-05-22: 25 ug via INTRAVENOUS

## 2021-05-22 MED ORDER — SODIUM CHLORIDE 0.9 % IV SOLN
2.0000 g | INTRAVENOUS | Status: DC
  Administered 2021-05-22 – 2021-05-23 (×2): 2 g via INTRAVENOUS
  Filled 2021-05-22 (×2): qty 20

## 2021-05-22 MED ORDER — SODIUM CHLORIDE 0.9 % IV SOLN: 250.0000 mL | INTRAVENOUS | Status: DC | PRN

## 2021-05-22 MED ORDER — SODIUM CHLORIDE 0.9 % IV SOLN
250.0000 mL | INTRAVENOUS | Status: DC
  Administered 2021-05-22 – 2021-05-23 (×2): 250 mL via INTRAVENOUS

## 2021-05-22 MED ORDER — CHLORHEXIDINE GLUCONATE 0.12% ORAL RINSE (MEDLINE KIT): 15.0000 mL | Freq: Two times a day (BID) | OROMUCOSAL | Status: DC

## 2021-05-22 MED ORDER — POLYETHYLENE GLYCOL 3350 17 G PO PACK
17.0000 g | PACK | Freq: Every day | ORAL | Status: DC
  Administered 2021-05-22 – 2021-05-23 (×2): 17 g
  Filled 2021-05-22 (×2): qty 1

## 2021-05-22 MED ORDER — SODIUM CHLORIDE 0.9 % IV SOLN: INTRAVENOUS | Status: AC

## 2021-05-22 MED ORDER — ASPIRIN 81 MG PO CHEW: 81.0000 mg | CHEWABLE_TABLET | ORAL | Status: DC

## 2021-05-22 MED ORDER — IPRATROPIUM-ALBUTEROL 0.5-2.5 (3) MG/3ML IN SOLN
3.0000 mL | RESPIRATORY_TRACT | Status: DC
  Administered 2021-05-22 – 2021-05-23 (×8): 3 mL via RESPIRATORY_TRACT
  Filled 2021-05-22 (×8): qty 3

## 2021-05-22 MED ORDER — SODIUM CHLORIDE 0.9% FLUSH
10.0000 mL | Freq: Two times a day (BID) | INTRAVENOUS | Status: DC
  Administered 2021-05-22: 10 mL
  Administered 2021-05-22: 20 mL
  Administered 2021-05-23 – 2021-05-25 (×4): 10 mL
  Administered 2021-05-25: 30 mL
  Administered 2021-05-26 – 2021-05-30 (×8): 10 mL

## 2021-05-22 MED ORDER — CHLORHEXIDINE GLUCONATE 0.12% ORAL RINSE (MEDLINE KIT)
15.0000 mL | Freq: Two times a day (BID) | OROMUCOSAL | Status: DC
  Administered 2021-05-22 – 2021-05-29 (×13): 15 mL via OROMUCOSAL

## 2021-05-22 MED ORDER — PROPOFOL 1000 MG/100ML IV EMUL
0.0000 ug/kg/min | INTRAVENOUS | Status: DC
  Filled 2021-05-22: qty 100

## 2021-05-22 MED ORDER — MIDAZOLAM HCL 2 MG/2ML IJ SOLN: 1.0000 mg | INTRAMUSCULAR | Status: DC | PRN

## 2021-05-22 MED ORDER — ASPIRIN 81 MG PO CHEW
81.0000 mg | CHEWABLE_TABLET | Freq: Every day | ORAL | Status: DC
  Administered 2021-05-23: 81 mg
  Filled 2021-05-22: qty 1

## 2021-05-22 MED ORDER — GABAPENTIN 250 MG/5ML PO SOLN
600.0000 mg | Freq: Two times a day (BID) | ORAL | Status: DC
  Administered 2021-05-22 – 2021-05-23 (×2): 600 mg
  Filled 2021-05-22 (×5): qty 12

## 2021-05-22 MED ORDER — HEPARIN (PORCINE) IN NACL 1000-0.9 UT/500ML-% IV SOLN
INTRAVENOUS | Status: AC
  Filled 2021-05-22: qty 1000

## 2021-05-22 MED ORDER — SODIUM CHLORIDE 0.9% FLUSH
3.0000 mL | Freq: Two times a day (BID) | INTRAVENOUS | Status: DC
  Administered 2021-05-22: 3 mL via INTRAVENOUS

## 2021-05-22 MED ORDER — SODIUM CHLORIDE 0.9% FLUSH
3.0000 mL | INTRAVENOUS | Status: DC | PRN
  Administered 2021-05-29: 3 mL via INTRAVENOUS

## 2021-05-22 MED ORDER — SODIUM CHLORIDE 0.9 % IV SOLN: INTRAVENOUS | Status: DC

## 2021-05-22 MED ORDER — HEPARIN SODIUM (PORCINE) 1000 UNIT/ML IJ SOLN
INTRAMUSCULAR | Status: AC
  Filled 2021-05-22: qty 1

## 2021-05-22 MED ORDER — ACETAMINOPHEN 160 MG/5ML PO SOLN
650.0000 mg | ORAL | Status: DC | PRN
  Administered 2021-05-22: 650 mg
  Filled 2021-05-22: qty 20.3

## 2021-05-22 MED ORDER — ORAL CARE MOUTH RINSE
15.0000 mL | OROMUCOSAL | Status: DC
  Administered 2021-05-22 – 2021-05-23 (×11): 15 mL via OROMUCOSAL

## 2021-05-22 MED ORDER — INSULIN ASPART 100 UNIT/ML IJ SOLN
0.0000 [IU] | INTRAMUSCULAR | Status: DC
  Administered 2021-05-22: 8 [IU] via SUBCUTANEOUS
  Administered 2021-05-22 (×2): 5 [IU] via SUBCUTANEOUS
  Administered 2021-05-22: 8 [IU] via SUBCUTANEOUS
  Administered 2021-05-23: 3 [IU] via SUBCUTANEOUS
  Administered 2021-05-23 (×4): 5 [IU] via SUBCUTANEOUS
  Administered 2021-05-24 (×2): 2 [IU] via SUBCUTANEOUS
  Administered 2021-05-24 (×2): 3 [IU] via SUBCUTANEOUS
  Administered 2021-05-24 – 2021-05-25 (×2): 2 [IU] via SUBCUTANEOUS
  Administered 2021-05-25: 3 [IU] via SUBCUTANEOUS
  Administered 2021-05-25 – 2021-05-27 (×6): 2 [IU] via SUBCUTANEOUS
  Administered 2021-05-27 (×2): 3 [IU] via SUBCUTANEOUS
  Administered 2021-05-27: 2 [IU] via SUBCUTANEOUS
  Administered 2021-05-27: 5 [IU] via SUBCUTANEOUS
  Administered 2021-05-28: 2 [IU] via SUBCUTANEOUS
  Administered 2021-05-28: 3 [IU] via SUBCUTANEOUS
  Administered 2021-05-28 (×3): 5 [IU] via SUBCUTANEOUS
  Administered 2021-05-29 (×2): 3 [IU] via SUBCUTANEOUS
  Administered 2021-05-29 (×2): 5 [IU] via SUBCUTANEOUS
  Administered 2021-05-29: 3 [IU] via SUBCUTANEOUS
  Administered 2021-05-29: 2 [IU] via SUBCUTANEOUS

## 2021-05-22 MED ORDER — DOCUSATE SODIUM 50 MG/5ML PO LIQD
100.0000 mg | Freq: Two times a day (BID) | ORAL | Status: DC
  Administered 2021-05-22 – 2021-05-23 (×3): 100 mg
  Filled 2021-05-22 (×3): qty 10

## 2021-05-22 MED ORDER — FENTANYL CITRATE (PF) 100 MCG/2ML IJ SOLN
25.0000 ug | INTRAMUSCULAR | Status: DC | PRN
  Administered 2021-05-22 – 2021-05-28 (×3): 100 ug via INTRAVENOUS
  Filled 2021-05-22 (×3): qty 2

## 2021-05-22 MED ORDER — PHENYLEPHRINE HCL-NACL 10-0.9 MG/250ML-% IV SOLN
INTRAVENOUS | Status: AC
  Filled 2021-05-22: qty 250

## 2021-05-22 MED ORDER — HEPARIN (PORCINE) IN NACL 1000-0.9 UT/500ML-% IV SOLN
INTRAVENOUS | Status: DC | PRN
  Administered 2021-05-22 (×2): 500 mL

## 2021-05-22 MED ORDER — INSULIN DETEMIR 100 UNIT/ML ~~LOC~~ SOLN
10.0000 [IU] | Freq: Two times a day (BID) | SUBCUTANEOUS | Status: DC
  Administered 2021-05-22 – 2021-05-30 (×17): 10 [IU] via SUBCUTANEOUS
  Filled 2021-05-22 (×18): qty 0.1

## 2021-05-22 MED ORDER — LEVOTHYROXINE SODIUM 50 MCG PO TABS
50.0000 ug | ORAL_TABLET | Freq: Every day | ORAL | Status: DC
  Administered 2021-05-23: 50 ug
  Filled 2021-05-22: qty 1

## 2021-05-22 MED ORDER — MIDAZOLAM HCL 2 MG/2ML IJ SOLN
INTRAMUSCULAR | Status: DC | PRN
  Administered 2021-05-22: 1 mg via INTRAVENOUS

## 2021-05-22 MED ORDER — LABETALOL HCL 5 MG/ML IV SOLN: 10.0000 mg | INTRAVENOUS | Status: AC | PRN

## 2021-05-22 MED ORDER — IPRATROPIUM-ALBUTEROL 0.5-2.5 (3) MG/3ML IN SOLN
3.0000 mL | Freq: Four times a day (QID) | RESPIRATORY_TRACT | Status: DC | PRN
  Administered 2021-05-24 – 2021-05-25 (×2): 3 mL via RESPIRATORY_TRACT
  Filled 2021-05-22 (×2): qty 3

## 2021-05-22 MED ORDER — ATORVASTATIN CALCIUM 40 MG PO TABS: 40.0000 mg | ORAL_TABLET | Freq: Every day | ORAL | Status: DC

## 2021-05-22 MED ORDER — NOREPINEPHRINE 4 MG/250ML-% IV SOLN
2.0000 ug/min | INTRAVENOUS | Status: DC
  Administered 2021-05-22 (×2): 2 ug/min via INTRAVENOUS
  Filled 2021-05-22 (×2): qty 250

## 2021-05-22 MED ORDER — ALBUMIN HUMAN 5 % IV SOLN
INTRAVENOUS | Status: AC
  Filled 2021-05-22: qty 500

## 2021-05-22 MED ORDER — PANTOPRAZOLE SODIUM 40 MG IV SOLR
40.0000 mg | Freq: Every day | INTRAVENOUS | Status: DC
  Administered 2021-05-22 – 2021-05-26 (×5): 40 mg via INTRAVENOUS
  Filled 2021-05-22 (×5): qty 40

## 2021-05-22 MED ORDER — FENTANYL CITRATE (PF) 100 MCG/2ML IJ SOLN
INTRAMUSCULAR | Status: AC
  Filled 2021-05-22: qty 2

## 2021-05-22 MED ORDER — SODIUM CHLORIDE 0.9% FLUSH: 10.0000 mL | INTRAVENOUS | Status: DC | PRN

## 2021-05-22 MED ORDER — SODIUM CHLORIDE 0.9% FLUSH
3.0000 mL | Freq: Two times a day (BID) | INTRAVENOUS | Status: DC
  Administered 2021-05-22 – 2021-05-29 (×11): 3 mL via INTRAVENOUS

## 2021-05-22 MED ORDER — IOHEXOL 350 MG/ML SOLN
INTRAVENOUS | Status: DC | PRN
  Administered 2021-05-22: 35 mL

## 2021-05-22 MED ORDER — HYDRALAZINE HCL 20 MG/ML IJ SOLN: 10.0000 mg | INTRAMUSCULAR | Status: AC | PRN

## 2021-05-22 MED ORDER — METOPROLOL TARTRATE 12.5 MG HALF TABLET: 12.5000 mg | ORAL_TABLET | Freq: Two times a day (BID) | ORAL | Status: DC

## 2021-05-22 MED ORDER — SODIUM CHLORIDE 0.9 % IV BOLUS
500.0000 mL | Freq: Once | INTRAVENOUS | Status: AC
  Administered 2021-05-22: 500 mL via INTRAVENOUS

## 2021-05-22 MED ORDER — SODIUM CHLORIDE 0.9% FLUSH: 3.0000 mL | INTRAVENOUS | Status: DC | PRN

## 2021-05-22 MED ORDER — LIDOCAINE HCL (PF) 1 % IJ SOLN
INTRAMUSCULAR | Status: DC | PRN
  Administered 2021-05-22: 15 mL

## 2021-05-22 MED ORDER — TRAZODONE HCL 50 MG PO TABS: 50.0000 mg | ORAL_TABLET | Freq: Every evening | ORAL | Status: DC | PRN

## 2021-05-22 MED ORDER — GABAPENTIN 250 MG/5ML PO SOLN
300.0000 mg | ORAL | Status: DC
  Administered 2021-05-22: 300 mg
  Filled 2021-05-22 (×4): qty 6

## 2021-05-22 SURGICAL SUPPLY — 10 items
CATH INFINITI 5 FR 3DRC (CATHETERS) ×1 IMPLANT
CATH INFINITI 5FR MULTPACK ANG (CATHETERS) ×1 IMPLANT
CLOSURE MYNX CONTROL 5F (Vascular Products) ×1 IMPLANT
KIT HEART LEFT (KITS) ×2 IMPLANT
KIT MICROPUNCTURE NIT STIFF (SHEATH) ×1 IMPLANT
PACK CARDIAC CATHETERIZATION (CUSTOM PROCEDURE TRAY) ×2 IMPLANT
SHEATH PINNACLE 5F 10CM (SHEATH) ×1 IMPLANT
TRANSDUCER W/STOPCOCK (MISCELLANEOUS) ×2 IMPLANT
TUBING CIL FLEX 10 FLL-RA (TUBING) ×2 IMPLANT
WIRE EMERALD 3MM-J .035X150CM (WIRE) ×1 IMPLANT

## 2021-05-22 NOTE — Progress Notes (Signed)
Paged regarding metop order and whether it should be given following cardiac arrest (in the setting of bradycardia) and still with pressor requirement. Patient initially presented as a transfer for management of HF/NSTEMI and hypoxic respiratory failure. Unfortunately experienced cardiac arrest following marked bradycardia this morning and underwent cath (prior CABG) with 3/4 patent grafts and no PCI targets. It was thought his decompensation this morning was 2/2 pulmonary decompensation followed by bradycardia as opposed to primary bradycardic arrest. He didn't have an indication for PPM or temporary PM given it was most likely a primary respiratory event. He is currently prescribed metop tartrate 12.5 mg VT bid. Per notes they were going to hold this for now. Will d/c while still on levophed however can resume once off pressors as he would have benefit from his reduced EF.

## 2021-05-22 NOTE — Progress Notes (Signed)
Progress Note  Patient Name: [name] Date of Encounter: 05/22/2021  [facility] Cardiologist: None   Subjective   Intubated.  Moving on vent  Inpatient Medications    Scheduled Meds: . aspirin  81 mg Oral Daily  . atorvastatin  40 mg Oral QHS  . chlorhexidine gluconate (MEDLINE KIT)  15 mL Mouth Rinse BID  . Chlorhexidine Gluconate Cloth  6 each Topical Daily  . gabapentin  300 mg Oral Q1400  . gabapentin  600 mg Oral BID AC & HS  . heparin injection (subcutaneous)  5,000 Units Subcutaneous Q8H  . insulin aspart  0-15 Units Subcutaneous TID WC  . ipratropium-albuterol  3 mL Nebulization TID  . levothyroxine  50 mcg Oral QAC breakfast  . mouth rinse  15 mL Mouth Rinse 10 times per day  . metoprolol tartrate  12.5 mg Oral BID  . pantoprazole  40 mg Oral Daily   Continuous Infusions: . furosemide     PRN Meds: acetaminophen, ipratropium-albuterol, naphazoline-pheniramine, nitroGLYCERIN, ondansetron (ZOFRAN) IV, traZODone   Vital Signs    Vitals:   05/21/21 2117 05/21/21 2300 05/22/21 0315 05/22/21 0422  BP: (!) 157/88 116/74 124/62   Pulse: 60 (!) 56 (!) 56   Resp:  15 14   Temp:  97.6 F (36.4 C) 98.2 F (36.8 C)   TempSrc:  Oral Oral   SpO2:  95% 97%   Weight:    84.7 kg  Height:        Intake/Output Summary (Last 24 hours) at 05/22/2021 0757 Last data filed at 05/22/2021 0323 Gross per 24 hour  Intake 240 ml  Output 2010 ml  Net -1770 ml   Last 3 Weights 05/22/2021 05/21/2021 05/20/2021  Weight (lbs) 186 lb 11.7 oz 206 lb 12.7 oz 192 lb 14.4 oz  Weight (kg) 84.7 kg 93.8 kg 87.5 kg      Telemetry    Frequent PVCs. Short episodes of NSVT and ventricular bigeminy - Personally Reviewed  ECG    05/20/21: Sinus rhythm. Rate 62 bpm.  First degree AV block.  Frequent PVCs. Prior inferior infarct - Personally Reviewed  Physical Exam   VS:  BP 124/62 (BP Location: Left Arm)   Pulse (!) 56   Temp 98.2 F (36.8 C) (Oral)   Resp 14   Ht 5' 4" (1.626  m)   Wt 84.7 kg   SpO2 97%   BMI 32.05 kg/m  , BMI Body mass index is 32.05 kg/m. GENERAL:  Critically ill-appearing.  Intubated and sedated. HEENT: Pupils equal round and reactive, fundi not visualized, oral mucosa unremarkable NECK:  No jugular venous distention, waveform within normal limits, carotid upstroke brisk and symmetric, no bruits LUNGS:  CTAB on anterior exam HEART:  RRR.  PMI not displaced or sustained,S1 and S2 within normal limits, no S3, no S4, no clicks, no rubs, no murmurs ABD:  Flat, positive bowel sounds normal in frequency in pitch, no bruits, no rebound, no guarding, no midline pulsatile mass, no hepatomegaly, no splenomegaly EXT:  L BKA.  1+ R LE edema, no cyanosis no clubbing SKIN:  No rashes no nodules NEURO:  Opening eyes.  Moving all four extremities. PSYCH:  Unable to assess  Labs    High Sensitivity Troponin:   Recent Labs  Lab 05/19/21 0610  TROPONINIHS 593*      Chemistry Recent Labs  Lab 05/20/21 0130 05/21/21 0102 05/22/21 0026  NA 134* 131* 132*  K 4.7 4.7 4.8  CL 102  97* 97*  CO2 20* 23 21*  GLUCOSE 263* 344* 226*  BUN 74* 80* 85*  CREATININE 4.18* 4.14* 3.81*  CALCIUM 8.1* 7.9* 8.2*  ALBUMIN  --  2.9* 3.2*  GFRNONAA 13* 13* 15*  ANIONGAP _0 Hematology Recent Labs  Lab 05/20/21 0130 05/21/21 0102 05/22/21 0103  WBC 6.8 6.6 6.2  RBC 3.24* 3.14* 3.13*  HGB 10.1* 9.9* 9.8*  HCT 32.8* 31.8* 31.8*  MCV 101.2* 101.3* 101.6*  MCH 31.2 31.5 31.3  MCHC 30.8 31.1 30.8  RDW 12.8 12.8 12.7  PLT 176 150 158    BNP Recent Labs  Lab 05/19/21 1422  BNP 799.7*     DDimer  Recent Labs  Lab 05/20/21 1015  DDIMER 1.82*     Radiology    VAS [place] LOWER EXTREMITY VENOUS (DVT)  Result Date: 05/20/2021  Lower Venous DVT Study Patient Name:  [name]  Date of Exam:   05/20/2021 Medical Rec #: [id number]         Accession #:    [id number] Date of Birth: 01/17/1935         Patient Gender: M Patient Age:   68Y Exam  Location:  [facility] Procedure:      VAS [place] LOWER EXTREMITY VENOUS (DVT) Referring Phys: [id number] TIFFANY McKinley --------------------------------------------------------------------------------  Indications: Swelling.  Anticoagulation: Heparin. Limitations: Poor ultrasound/tissue interface. Comparison Study: No prior studies. Extensive arterial history. Performing Technologist: Darlin Coco RDMS,RVT  Examination Guidelines: A complete evaluation includes B-mode imaging, spectral Doppler, color Doppler, and power Doppler as needed of all accessible portions of each vessel. Bilateral testing is considered an integral part of a complete examination. Limited examinations for reoccurring indications may be performed as noted. The reflux portion of the exam is performed with the patient in reverse Trendelenburg.  +---------+---------------+---------+-----------+----------+--------------+ RIGHT    CompressibilityPhasicitySpontaneityPropertiesThrombus Aging +---------+---------------+---------+-----------+----------+--------------+ CFV      Full           Yes      Yes                                 +---------+---------------+---------+-----------+----------+--------------+ SFJ      Full                                                        +---------+---------------+---------+-----------+----------+--------------+ FV Prox  Full                                                        +---------+---------------+---------+-----------+----------+--------------+ FV Mid   Full                                                        +---------+---------------+---------+-----------+----------+--------------+ FV DistalFull                                                        +---------+---------------+---------+-----------+----------+--------------+  PFV      Full                                                         +---------+---------------+---------+-----------+----------+--------------+ POP      Full           Yes      Yes                                 +---------+---------------+---------+-----------+----------+--------------+ PTV      Full                                                        +---------+---------------+---------+-----------+----------+--------------+ PERO                    Yes      Yes                                 +---------+---------------+---------+-----------+----------+--------------+   +----+---------------+---------+-----------+----------+--------------+ LEFTCompressibilityPhasicitySpontaneityPropertiesThrombus Aging +----+---------------+---------+-----------+----------+--------------+ CFV Full           Yes      Yes                                 +----+---------------+---------+-----------+----------+--------------+    Summary: RIGHT: - There is no evidence of deep vein thrombosis in the lower extremity. However, portions of this examination were limited- see technologist comments above.  - No cystic structure found in the popliteal fossa.  LEFT: - No evidence of common femoral vein obstruction.  *See table(s) above for measurements and observations. Electronically signed by Jamelle Haring on 05/20/2021 at 2:54:23 PM.    Final     Cardiac Studies   ECHO 05/19/2021 1. Left ventricular ejection fraction, by estimation, is 50 to 55%. The  left ventricle has low normal function. The left ventricle demonstrates  regional wall motion abnormalities (see scoring diagram/findings for  description). There is mild left  ventricular hypertrophy. Left ventricular diastolic parameters are  indeterminate.  2. Right ventricular systolic function is moderately reduced. The right  ventricular size is moderately enlarged. There is severely elevated  pulmonary artery systolic pressure. The estimated right ventricular  systolic pressure is 63.3 mmHg.  3. Left  atrial size was mildly dilated.  4. The mitral valve is abnormal. Trivial mitral valve regurgitation.  Severe mitral annular calcification.  5. The aortic valve is tricuspid. There is mild calcification of the  aortic valve. Aortic valve regurgitation is not visualized. Mild to  moderate aortic valve sclerosis/calcification is present, without any  evidence of aortic stenosis. Aortic valve mean  gradient measures 5.0 mmHg.  6. The inferior vena cava is normal in size with <50% respiratory  variability, suggesting right atrial pressure of 8 mmHg.   Echocardiogram: 05/2018 Study Conclusions   - Left ventricle: The cavity size was normal. Wall thickness was  increased in a pattern of mild LVH. Systolic function was normal.  The estimated ejection fraction was in the range  of 55% to 60%.  Wall motion was normal; there were no regional wall motion  abnormalities. Features are consistent with a pseudonormal left  ventricular filling pattern, with concomitant abnormal relaxation  and increased filling pressure (grade 2 diastolic dysfunction).  - Aortic valve: Mildly calcified annulus. Mildly thickened, mildly  calcified leaflets.  - Mitral valve: Mildly calcified annulus. Moderately thickened,  moderately calcified leaflets .   Patient Profile     86 y.o. male with CAD status post CABG, PAD status post left-pop bypass 05/2017 and left BKA, hypertension, hyperlipidemia, diabetes, carotid stenosis, and stage IV CKD admitted with hypoxic respiratory failure, volume overload, and elevated troponin.  Admitted to [facility] after a cardiac arrest on 6/8 precipitated by bradycardia.  Assessment & Plan    # Brady-arrest: [name] became bradycardic to the 20s and lost his pulse.  He had epinephrine and brief CPR with ROSC.  He had bradycardia at the OSH but heart rate had been stable on low dose metoprolol.  Given his NSTEMI and frequent ectopy, we were attempting to maintain some  beta blocker but will now hold.  Limited echo pending.  There were no major electolyte imbalances at the time.  Renal function was improving.  Concerning that this is likely primarily cardiac.  The ability to assess for ischemia has been limited by his renal function and not being a long-term HD candidate.  Will discuss LHC with his family and nephrology as he may need short term HD after.  It would be hard to put in a pacemaker without first ruling out ischemia, especially given his ventricular ectopy and elevated cardiac enzymes.  # Acute diastolic heart failure:  # Pulmonary hypertension:  # Acute on chronic renal failure:  Left ventricular systolic function was relatively preserved on echo this admission.  Right ventricular function is reduced and pulmonary pressures are elevated.  proBNP was elevated to 10,000 at [place] and chest x-ray was consistent with heart failure.  He initially diuresed well with IV Lasix but then renal function worsened despite remaining volume overloaded.  He is not a candidate for hemodialysis so did not undergo LHC.  Appreciate nephrology guiding diuresis and renal function has been improving.  Will discuss possibility of LHC as above.   # CAD s/p CABG:  # Elevated troponin: # Hyperlipidemia:  CABGin 2010 with LIMA-LAD, SVG-D1, SVG-LCx and SVG-PDA.  High-sensitivity troponin was elevated to 937 at outside hospital.  Now downtrending.  He has not had chest pain.  Will cycle troponin and EKG given his cardiac arrest..  # PVCs:  Frequent PVCs on telemetry with ventricular bigeminy and NSVT.   Hold metoprolol 2/2 bradycardia. His arrest was not triggered by VT, so no amiodarone for now.  # PAD: S/p L BKA.  Wheelchair bound.  # Bladder obstruction: Foley placed 6/7.  Total critical care time: 45 minutes. Critical care time was exclusive of separately billable procedures and treating other patients. Critical care was necessary to treat or prevent imminent or  life-threatening deterioration. Critical care was time spent personally by me on the following activities: development of treatment plan with patient and/or surrogate as well as nursing, discussions with consultants, evaluation of patient's response to treatment, examination of patient, obtaining history from patient or surrogate, ordering and performing treatments and interventions, ordering and review of laboratory studies, ordering and review of radiographic studies, pulse oximetry and re-evaluation of patient's condition.   For questions or updates, please contact [place] Please consult [email or web address] for contact info  under        Signed, Skeet Latch, MD  05/22/2021, 7:57 AM

## 2021-05-22 NOTE — Interval H&P Note (Signed)
History and Physical Interval Note:  05/22/2021 2:41 PM  [name]  has presented today for surgery, with the diagnosis of bradicardia.  The various methods of treatment have been discussed with the patient and family. After consideration of risks, benefits and other options for treatment, the patient has consented to  Procedure(s): LEFT HEART CATH AND CORONARY ANGIOGRAPHY (N/A) as a surgical intervention.  The patient's history has been reviewed, patient examined, no change in status, stable for surgery.  I have reviewed the patient's chart and labs.  Questions were answered to the patient's satisfaction.    Cath Lab Visit (complete for each Cath Lab visit)  Clinical Evaluation Leading to the Procedure:   ACS: Yes.    Non-ACS:    Anginal Classification: CCS III  Anti-ischemic medical therapy: Minimal Therapy (1 class of medications)  Non-Invasive Test Results: No non-invasive testing performed  Prior CABG: Previous CABG        Lauree Chandler

## 2021-05-22 NOTE — Progress Notes (Signed)
Inpatient Diabetes Program Recommendations  AACE/ADA: New Consensus Statement on Inpatient Glycemic Control (2015)  Target Ranges:  Prepandial:   less than 140 mg/dL      Peak postprandial:   less than 180 mg/dL (1-2 hours)      Critically ill patients:  140 - 180 mg/dL   Lab Results  Component Value Date   GLUCAP 306 (H) 05/22/2021   HGBA1C 7.7 (H) 05/18/2021    Review of Glycemic Control Results for [name], [name] (MRN [id number]) as of 05/22/2021 09:06  Ref. Range 05/21/2021 11:08 05/21/2021 15:51 05/21/2021 17:12 05/21/2021 21:22 05/22/2021 06:12  Glucose-Capillary Latest Ref Range: 70 - 99 mg/dL 229 (H) 278 (H) 244 (H) 210 (H) 306 (H)    Diabetes history:  DM2 Outpatient Diabetes medications:  Lantus 40 units QAM, 25 units QHS, Humalog 5-10 units TID Current orders for Inpatient glycemic control:  Novolog 0-15 units Q4H  Inpatient Diabetes Program Recommendations:     Lantus 15 units daily   Will continue to follow while inpatient.  Thank you, Reche Dixon, RN, BSN Diabetes Coordinator Inpatient Diabetes Program [phone] (team pager from 8a-5p)

## 2021-05-22 NOTE — Plan of Care (Signed)
  Problem: Education: Goal: Ability to demonstrate management of disease process will improve Outcome: Progressing Goal: Ability to verbalize understanding of medication therapies will improve Outcome: Progressing Goal: Individualized Educational Video(s) Outcome: Progressing   Problem: Safety: Goal: Ability to remain free from injury will improve Outcome: Progressing   Problem: Pain Managment: Goal: General experience of comfort will improve Outcome: Progressing   Problem: Cardiac: Goal: Ability to achieve and maintain adequate cardiopulmonary perfusion will improve Outcome: Progressing

## 2021-05-22 NOTE — Consult Note (Signed)
NAME:  [name], MRN:  [id number], DOB:  06/28/1935, LOS: 4 ADMISSION DATE:  05/18/2021, CONSULTATION DATE:  05/22/2021 REFERRING MD:  Code blue team, CHIEF COMPLAINT:  Cardiac arrest/ intubated  History of Present Illness:  HPI obtained from medical chart review as patient is currently intubated on mechanical ventilation.  Male with prior history of never smoker, CAD status post CABG x4 in 2010, HFpEF, PAD s/p left femoropopliteal bypass 05/2018 and s/p L BKA, HTN, HLD, DMT2, and CKD stage IV who initially presented to [facility] ER on 6/3 with worsening dyspnea, progressive over the last month with lower extremity edema, and two-pillow orthopnea.  Found to be hypoxic and bradycardic and ventricular bigeminy with labs showing AKI, white count 9.9, pBNP 10k, trop hs 275-937 with CXR showing interstitial edema without consolidation.  He was given aspirin Lasix and nitro and transferred to [facility] with cardiology admitting for acute on chronic diastolic heart failure, frequent PVCs, troponinemia.and acute on chronic CKD.  Nephrology consulted 6/5 given worsening renal failure, volume overload, pulmonary edema, and ongoing SOB.  Repeated EF noted LVEF 50-55% with decreased RV function and elevated PAP .  Lasix dose increased however patient and his daughter were in favor of dialysis and deemed not a candidate for long term dialysis given his wishes, comorbidies, age, and frailty.  However on the morning of 6/8, patient became bradycardic f/b PEA arrest with 1 round of CPR and 1 epi with ROSC.  Pt was intubated by anesthesia, PCCM consulted, and transferred to [facility] ICU.  Patient following commands and hemodynamically stable post arrest but not needing sedation at this time.   Pertinent  Medical History  CAD status post CABG x4 in 2010, HFpEF, PAD s/p left femoropopliteal bypass 05/2018 and s/p L BKA, HTN, HLD, DMT2, and CKD stage IV   Significant Hospital Events: Including procedures, antibiotic start and stop  dates in addition to other pertinent events   . Presented to [facility] 6/3, tx to [facility] 6/4, Cards admiting . 6/5 nephrology consulting  Interim History / Subjective:   Objective   Blood pressure 124/62, pulse (!) 56, temperature 98.2 F (36.8 C), temperature source Oral, resp. rate 14, height '5\' 4"'$  (1.626 m), weight 84.7 kg, SpO2 99 %.    FiO2 (%):  [32 %-100 %] 100 %   Intake/Output Summary (Last 24 hours) at 05/22/2021 0806 Last data filed at 05/22/2021 [id number] Gross per 24 hour  Intake 240 ml  Output 2010 ml  Net -1770 ml   Filed Weights   05/20/21 0500 05/21/21 0400 05/22/21 0422  Weight: 87.5 kg 93.8 kg 84.7 kg    Examination: General:  Critically ill elderly male in NAD intubated on MV HEENT: MM pink/moist, ETT, pupils 3/reactive, anicertic Neuro: eyes open to commands, follows simple commands in all extremities CV: RR IR, no murmur, +1 peripheral pulses PULM:  MV supported breaths, diffuse rhonchi/ wheezing, copious secretions, small plugs GI: obese, soft, NT, bs+, foley  Extremities: warm/dry, trace LE edema, L BKA, Skin: no rashes or lesions   Labs/imaging that I havepersonally reviewed  (right click and "Reselect all SmartList Selections" daily)  CBC- WBC 6.2, Hgb stable 9.9- 9.8,   BMET- Na 132, sCr 4.14-> 3.81, K 4.8, glucose 226  Prev Mag 6/4 >> 2  UOP 2L/ 24 hr -1.7L/ net -6.2L   Resolved Hospital Problem list    Assessment & Plan:   Cardiac arrest- Bradycardic/ PEA 6/8 -  brief, ROSC after 3 mins/ 1 round/ 1 epi -  AM labs earlier today mostly unchanged, renal function actually slightly better/ stable UOP, suspect this was precipitated by a respiratory/ hypoxic/ aspiration event, but given hx will need to rule out cardiac etiology  -  post arrest- following commands appropriately therefore will defer TTM protocol -  Initially hemodynamically stable, not requiring vasopressor/ anti-hypertensives but now becoming hypotensive.  Will given NS 500 ml bolus for now  given he is net -6L and add peripheral NE for MAP goal > 65, monitor for worsening ectopy - will order PICC (discussed/ approved by Dr. Marval Regal given patient is not a long term dialysis candidate) -> coox after PICC placement -  avoid fever x 48 hours -  Remainder per Cardiology -  Pending repeat EKG, repeat TTE now, trend trop hs, and lactic acid  -  Repeat stat CBC and BMET/ Mag   Acute hypoxic respiratory failure in the post cardiac arrest setting Suspected aspiration PNA- copious secretions and few plugs suctioned by RT after intubation - never smoker  -Continue MV support, 8cc/kg IBW with goal Pplat <30 and DP<15  -VAP prevention protocol/ PPI -PAD protocol for sedation> prn propofol/ prn fentanyl as needed for RASS goal 0/-1 -wean FiO2 as able for SpO2 >92%  -daily SAT & SBT, ? If we can extubate later today, after R/LHC - CXR and ABG post intubation  - insert OGT - increase duonebs to q4, prn - send trach asp - empiric ceftriaxone for aspiration coverage  Frequent PVCs NSTEMI AoC HFpEF CAD s/p CABG  Hx HTN HLD Pulmonary Hypertension - per Cards, patient has denied CP this admission - plans for heart cath later today once stabilized  - see above  AoC CKD stage IV - per Nephrology  - plans to watch renal function post [facility] and determine if short trial of CRRT indicated.  Will hold off on trialysis cath placement for now - check BMET/ Mag now - continue foley  - stop lasix for today - strict I/Os, daily wts - avoid further nephrotoxins  DMT2, uncontrolled - HA1c 7.7 - change to q 4 SSI  Anemia of chronic illness  - H/H stable - trend CBC  Best practice (right click and "Reselect all SmartList Selections" daily)  Diet:  NPO Pain/Anxiety/Delirium protocol (if indicated): Yes (RASS goal -1) VAP protocol (if indicated): Yes DVT prophylaxis: LMWH and SCD GI prophylaxis: PPI Glucose control:  SSI Yes Central venous access:  N/A Arterial line:  N/A Foley:   N/A Mobility:  bed rest  PT consulted: Yes Last date of multidisciplinary goals of care discussion [ongoing, per primary ] Code Status:  full code Disposition: ICU  Labs   CBC: Recent Labs  Lab 05/19/21 0610 05/20/21 0130 05/21/21 0102 05/22/21 0103  WBC 7.8 6.8 6.6 6.2  HGB 10.2* 10.1* 9.9* 9.8*  HCT 33.3* 32.8* 31.8* 31.8*  MCV 101.2* 101.2* 101.3* 101.6*  PLT 187 176 150 0000000    Basic Metabolic Panel: Recent Labs  Lab 05/18/21 1618 05/19/21 0610 05/20/21 0130 05/21/21 0102 05/22/21 0026  NA  --  135 134* 131* 132*  K  --  4.8 4.7 4.7 4.8  CL  --  108 102 97* 97*  CO2  --  18* 20* 23 21*  GLUCOSE  --  40* 263* 344* 226*  BUN  --  70* 74* 80* 85*  CREATININE  --  3.89* 4.18* 4.14* 3.81*  CALCIUM  --  8.3* 8.1* 7.9* 8.2*  MG 2.0  --   --   --   --  PHOS  --   --   --  6.7* 6.7*   GFR: Estimated Creatinine Clearance: 14.2 mL/min (A) (by C-G formula based on SCr of 3.81 mg/dL (H)). Recent Labs  Lab 05/19/21 0610 05/20/21 0130 05/21/21 0102 05/22/21 0103  WBC 7.8 6.8 6.6 6.2    Liver Function Tests: Recent Labs  Lab 05/21/21 0102 05/22/21 0026  ALBUMIN 2.9* 3.2*   No results for input(s): LIPASE, AMYLASE in the last 168 hours. No results for input(s): AMMONIA in the last 168 hours.  ABG    Component Value Date/Time   PHART 7.345 (L) 02/22/2016 1050   PCO2ART 30.8 (L) 02/22/2016 1050   PO2ART 79.2 (L) 02/22/2016 1050   HCO3 18.0 (L) 02/22/2016 1050   TCO2 26 06/03/2018 1136   ACIDBASEDEF 8.2 (H) 02/22/2016 1050   O2SAT 79.2 02/22/2016 1050     Coagulation Profile: No results for input(s): INR, PROTIME in the last 168 hours.  Cardiac Enzymes: No results for input(s): CKTOTAL, CKMB, CKMBINDEX, TROPONINI in the last 168 hours.  HbA1C: Hemoglobin A1C  Date/Time Value Ref Range Status  03/26/2016 12:00 AM 9.6  Final   Hgb A1c MFr Bld  Date/Time Value Ref Range Status  05/18/2021 04:18 PM 7.7 (H) 4.8 - 5.6 % Final    Comment:    (NOTE)          Prediabetes: 5.7 - 6.4         Diabetes: >6.4         Glycemic control for adults with diabetes: <7.0   11/25/2019 05:31 PM 10.2 (H) 4.8 - 5.6 % Final    Comment:    (NOTE) Pre diabetes:          5.7%-6.4% Diabetes:              >6.4% Glycemic control for   <7.0% adults with diabetes     CBG: Recent Labs  Lab 05/21/21 1108 05/21/21 1551 05/21/21 1712 05/21/21 2122 05/22/21 0612  GLUCAP 229* 278* 244* 210* 306*    Review of Systems:   unable  Past Medical History:  He,  has a past medical history of Anemia, Arthritis, Carotid artery disease ([place]), CHF (congestive heart failure) ([place]), CKD (chronic kidney disease), stage IV ([place]), Coronary artery disease (2010), Diabetes mellitus, Dyspnea, Dysrhythmia, Foot ulcer ([place]), History of blood transfusion (05/2018), Hyperlipidemia, Hypertension, Hypothyroidism, Myocardial infarction ([place]) (2010), PAD (peripheral artery disease) ([place]), Postoperative atrial fibrillation ([place]), and Thrombocytopenia ([place]).   Surgical History:   Past Surgical History:  Procedure Laterality Date  . AMPUTATION Left 06/16/2018   Procedure: LEFT TRANSMETATARSAL AMPUTATION;  Surgeon: [name], MD;  Location: [place];  Service: Orthopedics;  Laterality: Left;  . AMPUTATION Left 07/16/2018   Procedure: LEFT BELOW KNEE AMPUTATION;  Surgeon: [name], MD;  Location: [place];  Service: Orthopedics;  Laterality: Left;  . AORTOGRAM Left 06/03/2018   Procedure: ABDOMINAL AORTOGRAM WITH CARBON DIOXIDE LEFT LOWER EXTREMITY RUNOFF;  Surgeon: Conrad Betsy Layne, MD;  Location: [place];  Service: Vascular;  Laterality: Left;  . BELOW KNEE LEG AMPUTATION Left 07/16/2018  . carotid insuff    . CORONARY ARTERY BYPASS GRAFT  2010   LIMA to LAD, SVG to diagonal, SVG to circumflex, marginal, SVG to posterior descending branch.  . FEMORAL-POPLITEAL BYPASS GRAFT Left 06/10/2018   Procedure: LEFT COMMON FEMORAL TO BELOW KNEE POPLITEAL BYPASS GRAFT WITH PROPATEN;  Surgeon:  Conrad Hamilton, MD;  Location: [place];  Service: Vascular;  Laterality: Left;  Social History:   reports that he has never smoked. He has never used smokeless tobacco. He reports previous alcohol use. He reports that he does not use drugs.   Family History:  His family history includes Heart attack in his brother and father.   Allergies No Known Allergies   Home Medications  Prior to Admission medications   Medication Sig Start Date End Date Taking? Authorizing Provider  amLODipine (NORVASC) 5 MG tablet Take 5 mg by mouth daily.   Yes [provider]  aspirin 81 MG tablet Take 81 mg by mouth at bedtime.    Yes [provider]  atorvastatin (LIPITOR) 40 MG tablet Take 40 mg by mouth at bedtime.  01/30/16  Yes [provider]  furosemide (LASIX) 40 MG tablet Take 40 mg by mouth daily as needed (swelling (blood pressure)).   Yes [provider]  insulin glargine (LANTUS) 100 UNIT/ML injection Inject 0.1 mLs (10 Units total) into the skin at bedtime. Patient taking differently: Inject 25-40 Units into the skin 2 (two) times daily. 25 units at night , 40 units in the morning. 11/28/19  Yes Johnson, Clanford L, MD  levothyroxine (SYNTHROID, LEVOTHROID) 50 MCG tablet Take 50 mcg by mouth daily before breakfast.    Yes [provider]  omega-3 fish oil (MAXEPA) 1000 MG CAPS capsule Take 1 capsule by mouth 3 (three) times daily. 04/19/21  Yes [provider]  pantoprazole (PROTONIX) 40 MG tablet Take 40 mg by mouth daily. 11/05/19  Yes [provider]  potassium chloride (K-DUR,KLOR-CON) 10 MEQ tablet Take 10 mEq by mouth daily. 07/10/18  Yes [provider]  traZODone (DESYREL) 50 MG tablet Take 1 tablet (50 mg total) by mouth at bedtime as needed for sleep. Patient not taking: Reported on 05/19/2021 11/28/19   Murlean Iba, MD     Critical care time: 45 mins       Kennieth Rad, ACNP [facility] 05/22/2021, 9:34 AM

## 2021-05-22 NOTE — Progress Notes (Signed)
Patient ID: [name], male   DOB: [birth year], 86 y.o.   MRN: [id number] S: Pt had bradycardic arrest this am and transferred to ICU after ACLS and intubated. O:BP 124/62 (BP Location: Left Arm)   Pulse (!) 56   Temp 98.2 F (36.8 C) (Oral)   Resp 14   Ht $R'5\' 4"'kB$  (1.626 m)   Wt 84.7 kg   SpO2 99%   BMI 32.05 kg/m   Intake/Output Summary (Last 24 hours) at 05/22/2021 1042 Last data filed at 05/22/2021 0323 Gross per 24 hour  Intake 240 ml  Output 1650 ml  Net -1410 ml   Intake/Output: I/O last 3 completed shifts: In: 1164.9 [P.O.:951; I.V.:151.9; IV Piggyback:62] Out: 5462 [[id number]]  Intake/Output this shift:  No intake/output data recorded. Weight change: -9.1 kg Gen: intubated but awake and responsive CVS: bradycardic at 56, no rub Resp: mechanically ventilated BS bilaterally Abd: +BS, soft, NT/ND Ext: 1+ edema of RLE, s/p LBKA  Recent Labs  Lab 05/19/21 0610 05/20/21 0130 05/21/21 0102 05/22/21 0026 05/22/21 0741 05/22/21 0912  NA 135 134* 131* 132* 132* 133*  K 4.8 4.7 4.7 4.8 4.4 4.2  CL 108 102 97* 97* 96*  --   CO2 18* 20* 23 21* 21*  --   GLUCOSE 40* 263* 344* 226* 332*  --   BUN 70* 74* 80* 85* 88*  --   CREATININE 3.89* 4.18* 4.14* 3.81* 3.96*  --   ALBUMIN  --   --  2.9* 3.2* 3.2*  --   CALCIUM 8.3* 8.1* 7.9* 8.2* 8.4*  --   PHOS  --   --  6.7* 6.7*  --   --   AST  --   --   --   --  50*  --   ALT  --   --   --   --  42  --    Liver Function Tests: Recent Labs  Lab 05/21/21 0102 05/22/21 0026 05/22/21 0741  AST  --   --  50*  ALT  --   --  42  ALKPHOS  --   --  71  BILITOT  --   --  0.7  PROT  --   --  6.6  ALBUMIN 2.9* 3.2* 3.2*   No results for input(s): LIPASE, AMYLASE in the last 168 hours. No results for input(s): AMMONIA in the last 168 hours. CBC: Recent Labs  Lab 05/19/21 0610 05/20/21 0130 05/21/21 0102 05/22/21 0103 05/22/21 0741 05/22/21 0912  WBC 7.8 6.8 6.6 6.2 13.9*  --   HGB 10.2* 10.1* 9.9* 9.8* 10.8* 9.9*   HCT 33.3* 32.8* 31.8* 31.8* 34.7* 29.0*  MCV 101.2* 101.2* 101.3* 101.6* 101.8*  --   PLT 187 176 150 158 182  --    Cardiac Enzymes: No results for input(s): CKTOTAL, CKMB, CKMBINDEX, TROPONINI in the last 168 hours. CBG: Recent Labs  Lab 05/21/21 1551 05/21/21 1712 05/21/21 2122 05/22/21 0612 05/22/21 1013  GLUCAP 278* 244* 210* 306* 269*    Iron Studies: No results for input(s): IRON, TIBC, TRANSFERRIN, FERRITIN in the last 72 hours. Studies/Results: DG Abd 1 View  Result Date: 05/22/2021 CLINICAL DATA:  Patient status post repositioning and OG tube. EXAM: ABDOMEN - 1 VIEW COMPARISON:  None. FINDINGS: OG tube is seen with its tip and side-port in the stomach. IMPRESSION: OG tube in good position. Electronically Signed   By: Inge Rise M.D.   On: 05/22/2021 10:30   DG CHEST PORT 1 VIEW  Result Date: 05/22/2021 CLINICAL DATA:  Intubation. EXAM: PORTABLE CHEST 1 VIEW COMPARISON:  05/19/2021. FINDINGS: Endotracheal tube noted with its tip 2 cm above the carina. Prior CABG. Cardiomegaly. Diffuse bilateral pulmonary infiltrates/edema. Bibasilar atelectasis. Tiny left pleural effusion cannot be excluded. Elevation left hemidiaphragm. No pneumothorax. IMPRESSION: Endotracheal tube noted with its tip 2 cm above the carina anatomic position. 2.  Prior CABG.  Heart size stable. 3. Diffuse bilateral pulmonary infiltrates/edema. Bibasilar atelectasis. Tiny left pleural effusion cannot be excluded. Elevation left hemidiaphragm. Electronically Signed   By: Marcello Moores  Register   On: 05/22/2021 08:28   VAS [place] LOWER EXTREMITY VENOUS (DVT)  Result Date: 05/20/2021  Lower Venous DVT Study Patient Name:  [name]  Date of Exam:   05/20/2021 Medical Rec #: [id number]         Accession #:    [id number] Date of Birth: [birth year]         Patient Gender: M Patient Age:   72Y Exam Location:  [facility] Procedure:      VAS [place] LOWER EXTREMITY VENOUS (DVT) Referring Phys: [id number] [name]  --------------------------------------------------------------------------------  Indications: Swelling.  Anticoagulation: Heparin. Limitations: Poor ultrasound/tissue interface. Comparison Study: No prior studies. Extensive arterial history. Performing Technologist: Darlin Coco RDMS,RVT  Examination Guidelines: A complete evaluation includes B-mode imaging, spectral Doppler, color Doppler, and power Doppler as needed of all accessible portions of each vessel. Bilateral testing is considered an integral part of a complete examination. Limited examinations for reoccurring indications may be performed as noted. The reflux portion of the exam is performed with the patient in reverse Trendelenburg.  +---------+---------------+---------+-----------+----------+--------------+ RIGHT    CompressibilityPhasicitySpontaneityPropertiesThrombus Aging +---------+---------------+---------+-----------+----------+--------------+ CFV      Full           Yes      Yes                                 +---------+---------------+---------+-----------+----------+--------------+ SFJ      Full                                                        +---------+---------------+---------+-----------+----------+--------------+ FV Prox  Full                                                        +---------+---------------+---------+-----------+----------+--------------+ FV Mid   Full                                                        +---------+---------------+---------+-----------+----------+--------------+ FV DistalFull                                                        +---------+---------------+---------+-----------+----------+--------------+ PFV      Full                                                        +---------+---------------+---------+-----------+----------+--------------+  POP      Full           Yes      Yes                                  +---------+---------------+---------+-----------+----------+--------------+ PTV      Full                                                        +---------+---------------+---------+-----------+----------+--------------+ PERO                    Yes      Yes                                 +---------+---------------+---------+-----------+----------+--------------+   +----+---------------+---------+-----------+----------+--------------+ LEFTCompressibilityPhasicitySpontaneityPropertiesThrombus Aging +----+---------------+---------+-----------+----------+--------------+ CFV Full           Yes      Yes                                 +----+---------------+---------+-----------+----------+--------------+    Summary: RIGHT: - There is no evidence of deep vein thrombosis in the lower extremity. However, portions of this examination were limited- see technologist comments above.  - No cystic structure found in the popliteal fossa.  LEFT: - No evidence of common femoral vein obstruction.  *See table(s) above for measurements and observations. Electronically signed by Jamelle Haring on 05/20/2021 at 2:54:23 PM.    Final    [place] EKG SITE RITE  Result Date: 05/22/2021 If Site Rite image not attached, placement could not be confirmed due to current cardiac rhythm.  Marland Kitchen aspirin  81 mg Oral Daily  . atorvastatin  40 mg Oral QHS  . chlorhexidine gluconate (MEDLINE KIT)  15 mL Mouth Rinse BID  . Chlorhexidine Gluconate Cloth  6 each Topical Daily  . docusate  100 mg Per Tube BID  . gabapentin  300 mg Per Tube Q24H  . gabapentin  600 mg Per Tube BID AC & HS  . heparin injection (subcutaneous)  5,000 Units Subcutaneous Q8H  . insulin aspart  0-15 Units Subcutaneous Q4H  . insulin detemir  10 Units Subcutaneous BID  . ipratropium-albuterol  3 mL Nebulization Q4H  . levothyroxine  50 mcg Oral QAC breakfast  . mouth rinse  15 mL Mouth Rinse 10 times per day  . mouth rinse  15 mL Mouth Rinse 10 times  per day  . metoprolol tartrate  12.5 mg Oral BID  . pantoprazole (PROTONIX) IV  40 mg Intravenous Daily  . polyethylene glycol  17 g Per Tube Daily    BMET    Component Value Date/Time   NA 133 (L) 05/22/2021 0912   K 4.2 05/22/2021 0912   CL 96 (L) 05/22/2021 0741   CO2 21 (L) 05/22/2021 0741   GLUCOSE 332 (H) 05/22/2021 0741   BUN 88 (H) 05/22/2021 0741   CREATININE 3.96 (H) 05/22/2021 0741   CALCIUM 8.4 (L) 05/22/2021 0741   GFRNONAA 14 (L) 05/22/2021 0741   GFRAA 21 (L) 11/27/2019 0633   CBC    Component Value Date/Time   WBC 13.9 (H) 05/22/2021  0741   RBC 3.41 (L) 05/22/2021 0741   HGB 9.9 (L) 05/22/2021 0912   HCT 29.0 (L) 05/22/2021 0912   PLT 182 05/22/2021 0741   MCV 101.8 (H) 05/22/2021 0741   MCH 31.7 05/22/2021 0741   MCHC 31.1 05/22/2021 0741   RDW 12.8 05/22/2021 0741   LYMPHSABS 1.5 11/25/2019 1731   MONOABS 0.5 11/25/2019 1731   EOSABS 0.4 11/25/2019 1731   BASOSABS 0.0 11/25/2019 1731    Assessment/Plan:  1. Bradycardic arrest - not due to electrolyte abnormalities and likely primary cardiac in nature.  Discussed case with Dr. Oval Linsey and agree with [facility].  Would cont to hold diuretics pre-cath and limit contrast.  Pt is a poor long-term dialysis candidate and he and his daughter are in agreement for short term trial of HD if needed post cath. 2. AKI/CKD stage IV - in setting of acute on chronic diastolic CHF and escalating diuretics consistent with cardiorenal syndrome. His Scr increased from 3.89 to 4.18 after UOP of 3.9 liters with lasix 120 mg IV tid. It was decreased to bid on 05/20/21 but still with 3.5 liters of UOP. 1. Will hold lasix due to pending LHC as above  2. He is not a suitable candidate for long-term dialysis given his poor functional status and multiple comorbidities. Per Dr. Jonnie Finner, he and his daughter are not in favor of dialysis and will continue to treat conservatively. 3. Acute on chronic diastolic CHF - responded to IV lasix 120  mg tid with 3.9 liters but rise in BUN/Cr. Will decrease frequency to daily and follow. He was on lasix 40 mg daily but may require switch to torsemide at time of discharge.  He is not sure if he was taking the furosemide regularly "I've been having trouble remembering things".  4. Anemia of CKD stage IV - follow H/H, iron stores. Hold off on ESA for now.  5. HTN - stable 6. IDDM - per primary   Donetta Potts, MD [facility] [phone]

## 2021-05-22 NOTE — Consult Note (Addendum)
Cardiology Consultation:   Patient ID: [name] MRN: [id number]; DOB: 03/11/1935  Admit date: 05/18/2021 Date of Consult: 05/22/2021  PCP:  [name], No   [facility] Providers Cardiologist:  Victor Klein, MD       Patient Profile:   [name] is a 86 y.o. male with a hx of CAD (CABG 2010), PVD (s/p left femoropopliteal bypass in 05/2018 and now left BKA), HTN, HLD, DM, CKD (IV), HFpEF   who is being seen 05/22/2021 for the evaluation of bradycardia at the request of [name].  History of Present Illness:   [name] initially presented to [facility] ED on 05/17/2021 for evaluation of worsening dyspnea, reported as wheelchair bound getting SOB with transfers., no CP. He was hypoxic, oxygen saturations were in the 80's upon arrival. HR was initially in the 40's and he was in ventricular bigeminy (suspect bradycardia was due to PVC's not being counted). Initial labs showed Na+ 138, K+ 4.6, creatinine 3.24 (creatinine at 2.32 in 07/2020), glucose 166, WBC 9.9, Hgb 11.6, platelets 202, Mg 2.0. Pro BNP elevated to 10,389. Initial Hs Troponin of 275 with repeat of 937. CXR showed interstitial edema with no evidence of consolidation.   Transferred to [facility] for further management of HF and NSTEMI, suspect demand ischemia  Initially not planned for cath given AKI on CKD (IV) and high risk for dialysis  Not particularly responding overly well to diuresis, with no improvement or even worse respiratory status on 6/5, his TTE noted LVEF 50-55%, mild LVH, mod recuced RV function and mod enlarged, no significant VHD Diuretics increased and nephrology consulted, they planned for high dose diuretic and they did not feel he was a HD candidate and recommended palliative care consult and was not a HD candidate if renal function deteriorated. He was able to wean off BIPAP 6/6 and seemed to be doing better 6/7/feeling and breathing better, telemetry reported with continued frequent  PVCs  THIS AM he developed marked bradycardia into the 20's, lost pulse and was coded (total 3 minutes by record) with one round of CPR and 1 dose of epi described as PEA by CCM note he was intubated and transferred to [place], hypotension developed requiring pressor support with levophed and given IVF. CCM mentions concern for major aspiration event vs primary cardiac event   He did undergo cath after d/w family finding Prox RCA lesion is 50% stenosed. Prox RCA to Mid RCA lesion is 50% stenosed. Ost LAD to Prox LAD lesion is 99% stenosed. LIMA graft was visualized by angiography. 2nd Mrg lesion is 100% stenosed. Prox Cx to Mid Cx lesion is 40% stenosed. SVG graft was visualized by angiography. SVG graft was visualized by angiography. SVG graft was visualized by angiography. LPAV lesion is 100% stenosed. Origin to Prox Graft lesion is 100% stenosed. Prox Graft lesion is 40% stenosed.   1. 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 The LAD has a severe ostial stenosis. The mid and distal LAD fills from the patent LIMA graft to the mid LAD. The Diagonal fills from the patent vein graft.  3. The Circumflex has moderate mid stenosis. The first obtuse marginal branch is occluded. The vein graft to the Circumflex is patent with mild disease in the proximal body of the graft.  The vein graft to the left sided PDA is occluded. The left sided PDA appears to be chronically occluded.  4. The small non-dominant RCA has moderate disease.  5.  LVEDP 13 mmHg   Recommendations: No targets for PCI. Continue medical management of CAD.  Marland Kitchen  Echo today noted marked decline in LVEF to 30-35% (RV function normal)  LABS K+ 4.8 > 4.4 > 4.2  BUN 85 > 88 Creat 3.81 > 3.96  Mag 2.2  Lactic acid 3.6  WBC 6.2 > 13.9 H/H 10/34 Plts 182  CXR IMPRESSION: Endotracheal tube noted with its tip 2 cm above the carina anatomic position. 2.  Prior CABG.  Heart size stable. 3. Diffuse  bilateral pulmonary infiltrates/edema. Bibasilar atelectasis. Tiny left pleural effusion cannot be excluded. Elevation left hemidiaphragm.   EP is asked to see regarding his bradycardia The patient is intubated and sedated, though follows commands   Past Medical History:  Diagnosis Date   Anemia    Arthritis    Carotid artery disease (HCC)    CHF (congestive heart failure) ([place])    CKD (chronic kidney disease), stage IV ([place])    Coronary artery disease 2010   Status post coronary artery bypass grafting   Diabetes mellitus    Type II   Dyspnea    Dysrhythmia    Foot ulcer ([place])    History of blood transfusion 05/2018   Hyperlipidemia    Hypertension    Hypothyroidism    Myocardial infarction [facility]) 2010   PAD (peripheral artery disease) (HCC)    Postoperative atrial fibrillation ([place])    Thrombocytopenia ([place])     Past Surgical History:  Procedure Laterality Date   AMPUTATION Left 06/16/2018   Procedure: LEFT TRANSMETATARSAL AMPUTATION;  Surgeon: [name], MD;  Location: [place];  Service: Orthopedics;  Laterality: Left;   AMPUTATION Left 07/16/2018   Procedure: LEFT BELOW KNEE AMPUTATION;  Surgeon: [name], MD;  Location: [place];  Service: Orthopedics;  Laterality: Left;   AORTOGRAM Left 06/03/2018   Procedure: ABDOMINAL AORTOGRAM WITH CARBON DIOXIDE LEFT LOWER EXTREMITY RUNOFF;  Surgeon: Conrad [place], MD;  Location: [place];  Service: Vascular;  Laterality: Left;   BELOW KNEE LEG AMPUTATION Left 07/16/2018   carotid insuff     CORONARY ARTERY BYPASS GRAFT  2010   LIMA to LAD, SVG to diagonal, SVG to circumflex, marginal, SVG to posterior descending branch.   FEMORAL-POPLITEAL BYPASS GRAFT Left 06/10/2018   Procedure: LEFT COMMON FEMORAL TO BELOW KNEE POPLITEAL BYPASS GRAFT WITH PROPATEN;  Surgeon: Conrad [place], MD;  Location: [place];  Service: Vascular;  Laterality: Left;     Home Medications:  Prior to Admission medications   Medication Sig Start Date End Date  Taking? Authorizing Provider  amLODipine (NORVASC) 5 MG tablet Take 5 mg by mouth daily.   Yes [provider]  aspirin 81 MG tablet Take 81 mg by mouth at bedtime.    Yes [provider]  atorvastatin (LIPITOR) 40 MG tablet Take 40 mg by mouth at bedtime.  01/30/16  Yes [provider]  furosemide (LASIX) 40 MG tablet Take 40 mg by mouth daily as needed (swelling (blood pressure)).   Yes [provider]  insulin glargine (LANTUS) 100 UNIT/ML injection Inject 0.1 mLs (10 Units total) into the skin at bedtime. Patient taking differently: Inject 25-40 Units into the skin 2 (two) times daily. 25 units at night , 40 units in the morning. 11/28/19  Yes Johnson, Clanford L, MD  levothyroxine (SYNTHROID, LEVOTHROID) 50 MCG tablet Take 50 mcg by mouth daily before breakfast.    Yes [provider]  omega-3 fish oil (MAXEPA) 1000  MG CAPS capsule Take 1 capsule by mouth 3 (three) times daily. 04/19/21  Yes [provider]  pantoprazole (PROTONIX) 40 MG tablet Take 40 mg by mouth daily. 11/05/19  Yes [provider]  potassium chloride (K-DUR,KLOR-CON) 10 MEQ tablet Take 10 mEq by mouth daily. 07/10/18  Yes [provider]  traZODone (DESYREL) 50 MG tablet Take 1 tablet (50 mg total) by mouth at bedtime as needed for sleep. Patient not taking: Reported on 05/19/2021 11/28/19   Murlean Iba, MD    Inpatient Medications: Scheduled Meds:  [MAR Hold] aspirin  81 mg Oral Daily   [MAR Hold] atorvastatin  40 mg Oral QHS   [MAR Hold] chlorhexidine gluconate (MEDLINE KIT)  15 mL Mouth Rinse BID   [MAR Hold] Chlorhexidine Gluconate Cloth  6 each Topical Daily   [MAR Hold] docusate  100 mg Per Tube BID   [MAR Hold] gabapentin  300 mg Per Tube Q24H   [MAR Hold] gabapentin  600 mg Per Tube BID AC & HS   [MAR Hold] heparin injection (subcutaneous)  5,000 Units Subcutaneous Q8H   [MAR Hold] insulin aspart  0-15 Units Subcutaneous Q4H   [MAR  Hold] insulin detemir  10 Units Subcutaneous BID   [MAR Hold] ipratropium-albuterol  3 mL Nebulization Q4H   [MAR Hold] levothyroxine  50 mcg Oral QAC breakfast   [MAR Hold] mouth rinse  15 mL Mouth Rinse 10 times per day   [facility] Hold] mouth rinse  15 mL Mouth Rinse 10 times per day   [facility] Hold] metoprolol tartrate  12.5 mg Oral BID   [MAR Hold] pantoprazole (PROTONIX) IV  40 mg Intravenous Daily   [MAR Hold] polyethylene glycol  17 g Per Tube Daily   [MAR Hold] sodium chloride flush  10-40 mL Intracatheter Q12H   sodium chloride flush  3 mL Intravenous Q12H   Continuous Infusions:  sodium chloride 250 mL (05/22/21 1018)   sodium chloride     sodium chloride     sodium chloride     albumin human Stopped (05/22/21 0900)   [MAR Hold] cefTRIAXone (ROCEPHIN)  IV Stopped (05/22/21 0955)   [MAR Hold] norepinephrine (LEVOPHED) Adult infusion 2 mcg/min (05/22/21 1400)   PRN Meds: sodium chloride, sodium chloride, [MAR Hold] acetaminophen, [MAR Hold] fentaNYL (SUBLIMAZE) injection, [MAR Hold] fentaNYL (SUBLIMAZE) injection, hydrALAZINE, [MAR Hold] ipratropium-albuterol, labetalol, [MAR Hold] midazolam, [MAR Hold] midazolam, [MAR Hold] naphazoline-pheniramine, [MAR Hold] nitroGLYCERIN, [MAR Hold] ondansetron (ZOFRAN) IV, [MAR Hold] sodium chloride flush, sodium chloride flush, sodium chloride flush, [MAR Hold] traZODone  Allergies:   No Known Allergies  Social History:   Social History   Socioeconomic History   Marital status: Widowed    Spouse name: Not on file   Number of children: Not on file   Years of education: Not on file   Highest education level: Not on file  Occupational History   Not on file  Tobacco Use   Smoking status: Never Smoker   Smokeless tobacco: Never Used  Vaping Use   Vaping Use: Never used  Substance and Sexual Activity   Alcohol use: Not Currently    Alcohol/week: 0.0 standard drinks   Drug use: No   Sexual activity: Not on file  Other Topics Concern   Not  on file  Social History Narrative   Lives with granddaughter   Social Determinants of Health   Financial Resource Strain: Not on file  Food Insecurity: Not on file  Transportation Needs: Not on file  Physical Activity:  Not on file  Stress: Not on file  Social Connections: Not on file  Intimate Partner Violence: Not on file    Family History:   Family History  Problem Relation Age of Onset   Heart attack Father    Heart attack Brother      ROS:  Please see the history of present illness.  All other ROS reviewed and negative.     Physical Exam/Data:   Vitals:   05/22/21 1543 05/22/21 1548 05/22/21 1601 05/22/21 1619  BP: 135/64     Pulse: 97 (!) 0 87   Resp: (!) 79 (!) 0 (!) 24   Temp:      TempSrc:      SpO2: 100% (!) 0% 99%   Weight:    84.7 kg  Height:        Intake/Output Summary (Last 24 hours) at 05/22/2021 1622 Last data filed at 05/22/2021 1400 Gross per 24 hour  Intake 429.68 ml  Output 1750 ml  Net -1320.32 ml   Last 3 Weights 05/22/2021 05/22/2021 05/21/2021  Weight (lbs) 186 lb 11.7 oz 186 lb 11.7 oz 206 lb 12.7 oz  Weight (kg) 84.7 kg 84.7 kg 93.8 kg     Body mass index is 32.05 kg/m.  General:  Well nourished, intubated and sedated HEENT: normal Lymph: no adenopathy Neck: no JVD Endocrine:  No thryomegaly Vascular: No carotid bruits  Cardiac:  RRR; extrasystoles, no murmurs, gallops or rubs Lungs:  Course BS b/l, no wheezing, rhonchi or rales  Abd: soft, not distended Ext: trace edema Musculoskeletal: L BKA Skin: warm and dry  Neuro:  Unable to assess Psych:  Unable to assess   EKG:  The EKG was personally reviewed and demonstrates:   SR frequent PVCs, intermittently bigemeny This AM was SR with very frequent PACs often grouped as well as PVCs, he developed CHB with rates 20's >> what looks a V rhyth with alternans > SR once in ICU again with very frequent ectopy  Telemetry:  Telemetry was personally reviewed and demonstrates:  1st SR 71bpm,  IVCD, PVCs 05/20/21 is SR 62bpm, essentially unchanged, PVCs Today post code is SR 84, IVCD, no clear acute ischemic changes, PVCs  Relevant CV Studies:  05/22/21; TTE IMPRESSIONS   1. Left ventricular ejection fraction, by estimation, is 30 to 35%. The  left ventricle has moderately decreased function. The left ventricle  demonstrates global hypokinesis. There is mild left ventricular  hypertrophy. Left ventricular diastolic  parameters are consistent with Grade I diastolic dysfunction (impaired  relaxation). There is the interventricular septum is flattened in systole  and diastole, consistent with right ventricular pressure and volume  overload.   2. Right ventricular systolic function is normal. The right ventricular  size is mildly enlarged. There is moderately elevated pulmonary artery  systolic pressure. The estimated right ventricular systolic pressure is  68.0 mmHg.   3. Left atrial size was mild to moderately dilated.   4. Right atrial size was mildly dilated.   5. The mitral valve is normal in structure. Mild mitral valve  regurgitation. No evidence of mitral stenosis. Severe mitral annular  calcification.   6. Tricuspid valve regurgitation is moderate.   7. The aortic valve is normal in structure. There is mild calcification  of the aortic valve. There is mild thickening of the aortic valve. Aortic  valve regurgitation is not visualized. Mild aortic valve sclerosis is  present, with no evidence of aortic  valve stenosis.   8. The inferior  vena cava is dilated in size with <50% respiratory  variability, suggesting right atrial pressure of 15 mmHg.    05/22/21 LHC Prox RCA lesion is 50% stenosed. Prox RCA to Mid RCA lesion is 50% stenosed. Ost LAD to Prox LAD lesion is 99% stenosed. LIMA graft was visualized by angiography. 2nd Mrg lesion is 100% stenosed. Prox Cx to Mid Cx lesion is 40% stenosed. SVG graft was visualized by angiography. SVG graft was visualized by  angiography. SVG graft was visualized by angiography. LPAV lesion is 100% stenosed. Origin to Prox Graft lesion is 100% stenosed. Prox Graft lesion is 40% stenosed.   1. 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 The LAD has a severe ostial stenosis. The mid and distal LAD fills from the patent LIMA graft to the mid LAD. The Diagonal fills from the patent vein graft.  3. The Circumflex has moderate mid stenosis. The first obtuse marginal branch is occluded. The vein graft to the Circumflex is patent with mild disease in the proximal body of the graft.  The vein graft to the left sided PDA is occluded. The left sided PDA appears to be chronically occluded.  4. The small non-dominant RCA has moderate disease.  5. LVEDP 13 mmHg   Recommendations: No targets for PCI. Continue medical management of CAD.    ECHO 05/19/2021  1. Left ventricular ejection fraction, by estimation, is 50 to 55%. The  left ventricle has low normal function. The left ventricle demonstrates  regional wall motion abnormalities (see scoring diagram/findings for  description). There is mild left  ventricular hypertrophy. Left ventricular diastolic parameters are  indeterminate.   2. Right ventricular systolic function is moderately reduced. The right  ventricular size is moderately enlarged. There is severely elevated  pulmonary artery systolic pressure. The estimated right ventricular  systolic pressure is 78.2 mmHg.   3. Left atrial size was mildly dilated.   4. The mitral valve is abnormal. Trivial mitral valve regurgitation.  Severe mitral annular calcification.   5. The aortic valve is tricuspid. There is mild calcification of the  aortic valve. Aortic valve regurgitation is not visualized. Mild to  moderate aortic valve sclerosis/calcification is present, without any  evidence of aortic stenosis. Aortic valve mean   gradient measures 5.0 mmHg.   6. The inferior vena cava is normal  in size with <50% respiratory  variability, suggesting right atrial pressure of 8 mmHg.    Echocardiogram: 05/2018 Study Conclusions   - Left ventricle: The cavity size was normal. Wall thickness was    increased in a pattern of mild LVH. Systolic function was normal.    The estimated ejection fraction was in the range of 55% to 60%.    Wall motion was normal; there were no regional wall motion    abnormalities. Features are consistent with a pseudonormal left    ventricular filling pattern, with concomitant abnormal relaxation    and increased filling pressure (grade 2 diastolic dysfunction).  - Aortic valve: Mildly calcified annulus. Mildly thickened, mildly    calcified leaflets.  - Mitral valve: Mildly calcified annulus. Moderately thickened,    moderately calcified leaflets .       Laboratory Data:  High Sensitivity Troponin:   Recent Labs  Lab 05/19/21 0610 05/22/21 0741 05/22/21 0926  TROPONINIHS 593* 155* 125*     Chemistry Recent Labs  Lab 05/21/21 0102 05/22/21 0026 05/22/21 0741 05/22/21 0912  NA 131* 132* 132* 133*  K 4.7  4.8 4.4 4.2  CL 97* 97* 96*  --   CO2 23 21* 21*  --   GLUCOSE 344* 226* 332*  --   BUN 80* 85* 88*  --   CREATININE 4.14* 3.81* 3.96*  --   CALCIUM 7.9* 8.2* 8.4*  --   GFRNONAA 13* 15* 14*  --   ANIONGAP '11 14 15  ' --     Recent Labs  Lab 05/21/21 0102 05/22/21 0026 05/22/21 0741  PROT  --   --  6.6  ALBUMIN 2.9* 3.2* 3.2*  AST  --   --  50*  ALT  --   --  42  ALKPHOS  --   --  71  BILITOT  --   --  0.7   Hematology Recent Labs  Lab 05/21/21 0102 05/22/21 0103 05/22/21 0741 05/22/21 0912  WBC 6.6 6.2 13.9*  --   RBC 3.14* 3.13* 3.41*  --   HGB 9.9* 9.8* 10.8* 9.9*  HCT 31.8* 31.8* 34.7* 29.0*  MCV 101.3* 101.6* 101.8*  --   MCH 31.5 31.3 31.7  --   MCHC 31.1 30.8 31.1  --   RDW 12.8 12.7 12.8  --   PLT 150 158 182  --    BNP Recent Labs  Lab 05/19/21 1422  BNP 799.7*    DDimer  Recent Labs  Lab  05/20/21 1015  DDIMER 1.82*     Radiology/Studies:  DG Abd 1 View Result Date: 05/22/2021 CLINICAL DATA:  Patient status post repositioning and OG tube. EXAM: ABDOMEN - 1 VIEW COMPARISON:  None. FINDINGS: OG tube is seen with its tip and side-port in the stomach. IMPRESSION: OG tube in good position. Electronically Signed   By: Inge Rise M.D.   On: 05/22/2021 10:30       VAS [place] LOWER EXTREMITY VENOUS (DVT) Result Date: 05/20/2021  Lower Venous DVT Study Patient Name:  SIEGFRIED VIETH  Date of Exam:   05/20/2021 Medical Rec #: [id number]         Accession #:    [id number] Date of Birth: 1935/03/13         Patient Gender: M Patient Age:   52Y Exam Location:  [facility] Procedure:      VAS [place] LOWER EXTREMITY VENOUS (DVT) Referring Phys: [id number] TIFFANY [place] --------------------------------------------------------------------------------  Indications: Swelling.  Anticoagulation: Heparin. Limitations: Poor ultrasound/tissue interface. Comparison Study: No prior studies. Extensive arterial history. Performing Technologist: Darlin Coco RDMS,RVT  Examination Guidelines: A complete evaluation includes B-mode imaging, spectral Doppler, color Doppler, and power Doppler as needed of all accessible portions of each vessel. Bilateral testing is considered an integral part of a complete examination. Limited examinations for reoccurring indications may be performed as noted. The reflux portion of the exam is performed with the patient in reverse Trendelenburg.    Summary: RIGHT: - There is no evidence of deep vein thrombosis in the lower extremity. However, portions of this examination were limited- see technologist comments above.  - No cystic structure found in the popliteal fossa.  LEFT: - No evidence of common femoral vein obstruction.  *See table(s) above for measurements and observations. Electronically signed by Jamelle Haring on 05/20/2021 at 2:54:23 PM.    Final       Assessment and Plan:    1. CHB/brady arrest this AM     I happened to be in the [place] usnit when central tele ccalled to check the patient 2/2 low saturations and when the nurse cheked tele he  was bradycardic     pleth signals are weak at that time in review of monitor and unclear   Suspect though that this was not a primary bradycardic event and suspect 2/2 to pulmonary status Though he was diuresing as seemingly turning around by yesterday's note ? If he had an aspiration event or perhaps flash edema   2. CKD IV-V)     Creat peaked at 4.18 >>  3.96 today though expect worsening again after contrast     Management deferred to nephrology team  3. CAD     severe multivessel disease with no targets     Continue with cardiology     LVEF is down post code     Trops not impressive 155 > 125  Dr. Lovena Le has seen and examined the patient, discussed with family at the bedside No  Indication for pacer at this time Over all poor prognosis If no better in a couple days would consider palliative   Risk Assessment/Risk Scores:   For questions or updates, please contact [facility] Please consult [email or web address] for contact info under    Signed, Baldwin Jamaica, PA-C  05/22/2021 4:22 PM  EP Attending  Patient seen and examined. Discussed the situation with his family. He is a pleasant elderly man with chronic lung disease and known CAD with several months of worsening dyspnea and several days of acute worsening who presented with respiratory failure. He had a brady arrest this morning associated with sinus brady and heart block. He did not have asystole despite being pulseless and was treated with a round of ACLS and regained his pulse. He was intubate and sedated. He underwent heart cath which demonstrated an occluded PDA. He has an old inferior scar and in addition his EF is decreased down to 35%. He has been thought to be a poor candidate for HD. His exam demonstrates a chronically ill appearing man on the  mechanical ventilator with scattered rhonchi and a left bka. Tele demonstrates NSR with dense ventricular ectopy.  A/P PEA arrest with bradycardia - I strongly suspect that his arrest was due to hypoventilation. Patients do not arrest from sinus brady or heart block in the absence of PMVT. He has redeveloped a pulse and bp. Acute on chronic renal failure - I think he is likely headed to end stage renal failure. Note no plans for long term HD.  Ischemic CM - cath results noted. The patient is likely chronically ischemic 4.   Transient AV block - he does not have that much in the way of conduction system disease with QRS of 118 and IVCD. I do not think that PPM is warranted nor is a temporary wire at this time.  Carleene Overlie Shamekia Tippets,MD

## 2021-05-22 NOTE — Transfer of Care (Signed)
Immediate Anesthesia Transfer of Care Note  Patient: [name]  Procedure(s) Performed: AN AD HOC INTUBATION  Patient Location: 2C  Anesthesia Type:Ad Hoc intubation  Level of Consciousness: obtunded  Airway & Oxygen Therapy: Patient remains intubated per anesthesia plan and Patient placed on Ventilator (see vital sign flow sheet for setting)  Post-op Assessment: Report given to RN and Code Blue, MD/RNs at Bedside  Post vital signs: Reviewed  Last Vitals:  Vitals Value Taken Time  BP 177/92 05/22/21 0744  Temp    Pulse 75 05/22/21 0746  Resp    SpO2 99 % 05/22/21 0746  Vitals shown include unvalidated device data.  Last Pain:  Vitals:   05/22/21 0315  TempSrc: Oral  PainSc: Asleep      Patients Stated Pain Goal: 0 ([id number])  Complications: No complications documented.

## 2021-05-22 NOTE — Progress Notes (Signed)
Pharmacist Heart Failure Core Measure Documentation  Assessment: [name] has an EF documented as 30-35% on 05/22/21 by Echo.  Rationale: Heart failure patients with left ventricular systolic dysfunction (LVSD) and an EF < 40% should be prescribed an angiotensin converting enzyme inhibitor (ACEI) or angiotensin receptor blocker (ARB) at discharge unless a contraindication is documented in the medical record.  This patient is not currently on an ACEI or ARB for HF.  This note is being placed in the record in order to provide documentation that a contraindication to the use of these agents is present for this encounter.  ACE Inhibitor or Angiotensin Receptor Blocker is contraindicated (specify all that apply)  '[]'$   ACEI allergy AND ARB allergy '[]'$   Angioedema '[]'$   Moderate or severe aortic stenosis '[]'$   Hyperkalemia '[]'$   Hypotension '[]'$   Renal artery stenosis '[x]'$   Worsening renal function, preexisting renal disease or dysfunction  Hildred Laser, PharmD Clinical Pharmacist **Pharmacist phone directory can now be found on [email or web address] ([email or web address]).  Listed under [place] [facility].

## 2021-05-22 NOTE — H&P (View-Only) (Signed)
 Progress Note  Patient Name: [name] Date of Encounter: 05/22/2021  [facility] Cardiologist: None   Subjective   Intubated.  Moving on vent  Inpatient Medications    Scheduled Meds: . aspirin  81 mg Oral Daily  . atorvastatin  40 mg Oral QHS  . chlorhexidine gluconate (MEDLINE KIT)  15 mL Mouth Rinse BID  . Chlorhexidine Gluconate Cloth  6 each Topical Daily  . gabapentin  300 mg Oral Q1400  . gabapentin  600 mg Oral BID AC & HS  . heparin injection (subcutaneous)  5,000 Units Subcutaneous Q8H  . insulin aspart  0-15 Units Subcutaneous TID WC  . ipratropium-albuterol  3 mL Nebulization TID  . levothyroxine  50 mcg Oral QAC breakfast  . mouth rinse  15 mL Mouth Rinse 10 times per day  . metoprolol tartrate  12.5 mg Oral BID  . pantoprazole  40 mg Oral Daily   Continuous Infusions: . furosemide     PRN Meds: acetaminophen, ipratropium-albuterol, naphazoline-pheniramine, nitroGLYCERIN, ondansetron (ZOFRAN) IV, traZODone   Vital Signs    Vitals:   05/21/21 2117 05/21/21 2300 05/22/21 0315 05/22/21 0422  BP: (!) 157/88 116/74 124/62   Pulse: 60 (!) 56 (!) 56   Resp:  15 14   Temp:  97.6 F (36.4 C) 98.2 F (36.8 C)   TempSrc:  Oral Oral   SpO2:  95% 97%   Weight:    84.7 kg  Height:        Intake/Output Summary (Last 24 hours) at 05/22/2021 0757 Last data filed at 05/22/2021 0323 Gross per 24 hour  Intake 240 ml  Output 2010 ml  Net -1770 ml   Last 3 Weights 05/22/2021 05/21/2021 05/20/2021  Weight (lbs) 186 lb 11.7 oz 206 lb 12.7 oz 192 lb 14.4 oz  Weight (kg) 84.7 kg 93.8 kg 87.5 kg      Telemetry    Frequent PVCs. Short episodes of NSVT and ventricular bigeminy - Personally Reviewed  ECG    05/20/21: Sinus rhythm. Rate 62 bpm.  First degree AV block.  Frequent PVCs. Prior inferior infarct - Personally Reviewed  Physical Exam   VS:  BP 124/62 (BP Location: Left Arm)   Pulse (!) 56   Temp 98.2 F (36.8 C) (Oral)   Resp 14   Ht 5' 4" (1.626  m)   Wt 84.7 kg   SpO2 97%   BMI 32.05 kg/m  , BMI Body mass index is 32.05 kg/m. GENERAL:  Critically ill-appearing.  Intubated and sedated. HEENT: Pupils equal round and reactive, fundi not visualized, oral mucosa unremarkable NECK:  No jugular venous distention, waveform within normal limits, carotid upstroke brisk and symmetric, no bruits LUNGS:  CTAB on anterior exam HEART:  RRR.  PMI not displaced or sustained,S1 and S2 within normal limits, no S3, no S4, no clicks, no rubs, no murmurs ABD:  Flat, positive bowel sounds normal in frequency in pitch, no bruits, no rebound, no guarding, no midline pulsatile mass, no hepatomegaly, no splenomegaly EXT:  L BKA.  1+ R LE edema, no cyanosis no clubbing SKIN:  No rashes no nodules NEURO:  Opening eyes.  Moving all four extremities. PSYCH:  Unable to assess  Labs    High Sensitivity Troponin:   Recent Labs  Lab 05/19/21 0610  TROPONINIHS 593*      Chemistry Recent Labs  Lab 05/20/21 0130 05/21/21 0102 05/22/21 0026  NA 134* 131* 132*  K 4.7 4.7 4.8  CL 102   97* 97*  CO2 20* 23 21*  GLUCOSE 263* 344* 226*  BUN 74* 80* 85*  CREATININE 4.18* 4.14* 3.81*  CALCIUM 8.1* 7.9* 8.2*  ALBUMIN  --  2.9* 3.2*  GFRNONAA 13* 13* 15*  ANIONGAP 12 11 14     Hematology Recent Labs  Lab 05/20/21 0130 05/21/21 0102 05/22/21 0103  WBC 6.8 6.6 6.2  RBC 3.24* 3.14* 3.13*  HGB 10.1* 9.9* 9.8*  HCT 32.8* 31.8* 31.8*  MCV 101.2* 101.3* 101.6*  MCH 31.2 31.5 31.3  MCHC 30.8 31.1 30.8  RDW 12.8 12.8 12.7  PLT 176 150 158    BNP Recent Labs  Lab 05/19/21 1422  BNP 799.7*     DDimer  Recent Labs  Lab 05/20/21 1015  DDIMER 1.82*     Radiology    VAS US LOWER EXTREMITY VENOUS (DVT)  Result Date: 05/20/2021  Lower Venous DVT Study Patient Name:  [name]  Date of Exam:   05/20/2021 Medical Rec #: [id number]         Accession #:    [id number] Date of Birth: 08/08/1935         Patient Gender: M Patient Age:   86Y Exam  Location:  [facility] Procedure:      VAS US LOWER EXTREMITY VENOUS (DVT) Referring Phys: [id number] Rodina Pinales Carson --------------------------------------------------------------------------------  Indications: Swelling.  Anticoagulation: Heparin. Limitations: Poor ultrasound/tissue interface. Comparison Study: No prior studies. Extensive arterial history. Performing Technologist: Rachel Hodge RDMS,RVT  Examination Guidelines: A complete evaluation includes B-mode imaging, spectral Doppler, color Doppler, and power Doppler as needed of all accessible portions of each vessel. Bilateral testing is considered an integral part of a complete examination. Limited examinations for reoccurring indications may be performed as noted. The reflux portion of the exam is performed with the patient in reverse Trendelenburg.  +---------+---------------+---------+-----------+----------+--------------+ RIGHT    CompressibilityPhasicitySpontaneityPropertiesThrombus Aging +---------+---------------+---------+-----------+----------+--------------+ CFV      Full           Yes      Yes                                 +---------+---------------+---------+-----------+----------+--------------+ SFJ      Full                                                        +---------+---------------+---------+-----------+----------+--------------+ FV Prox  Full                                                        +---------+---------------+---------+-----------+----------+--------------+ FV Mid   Full                                                        +---------+---------------+---------+-----------+----------+--------------+ FV DistalFull                                                        +---------+---------------+---------+-----------+----------+--------------+   PFV      Full                                                         +---------+---------------+---------+-----------+----------+--------------+ POP      Full           Yes      Yes                                 +---------+---------------+---------+-----------+----------+--------------+ PTV      Full                                                        +---------+---------------+---------+-----------+----------+--------------+ PERO                    Yes      Yes                                 +---------+---------------+---------+-----------+----------+--------------+   +----+---------------+---------+-----------+----------+--------------+ LEFTCompressibilityPhasicitySpontaneityPropertiesThrombus Aging +----+---------------+---------+-----------+----------+--------------+ CFV Full           Yes      Yes                                 +----+---------------+---------+-----------+----------+--------------+    Summary: RIGHT: - There is no evidence of deep vein thrombosis in the lower extremity. However, portions of this examination were limited- see technologist comments above.  - No cystic structure found in the popliteal fossa.  LEFT: - No evidence of common femoral vein obstruction.  *See table(s) above for measurements and observations. Electronically signed by Thomas Hawken on 05/20/2021 at 2:54:23 PM.    Final     Cardiac Studies   ECHO 05/19/2021 1. Left ventricular ejection fraction, by estimation, is 50 to 55%. The  left ventricle has low normal function. The left ventricle demonstrates  regional wall motion abnormalities (see scoring diagram/findings for  description). There is mild left  ventricular hypertrophy. Left ventricular diastolic parameters are  indeterminate.  2. Right ventricular systolic function is moderately reduced. The right  ventricular size is moderately enlarged. There is severely elevated  pulmonary artery systolic pressure. The estimated right ventricular  systolic pressure is 66.7 mmHg.  3. Left  atrial size was mildly dilated.  4. The mitral valve is abnormal. Trivial mitral valve regurgitation.  Severe mitral annular calcification.  5. The aortic valve is tricuspid. There is mild calcification of the  aortic valve. Aortic valve regurgitation is not visualized. Mild to  moderate aortic valve sclerosis/calcification is present, without any  evidence of aortic stenosis. Aortic valve mean  gradient measures 5.0 mmHg.  6. The inferior vena cava is normal in size with <50% respiratory  variability, suggesting right atrial pressure of 8 mmHg.   Echocardiogram: 05/2018 Study Conclusions   - Left ventricle: The cavity size was normal. Wall thickness was  increased in a pattern of mild LVH. Systolic function was normal.  The estimated ejection fraction was in the range   of 55% to 60%.  Wall motion was normal; there were no regional wall motion  abnormalities. Features are consistent with a pseudonormal left  ventricular filling pattern, with concomitant abnormal relaxation  and increased filling pressure (grade 2 diastolic dysfunction).  - Aortic valve: Mildly calcified annulus. Mildly thickened, mildly  calcified leaflets.  - Mitral valve: Mildly calcified annulus. Moderately thickened,  moderately calcified leaflets .   Patient Profile     86 y.o. male with CAD status post CABG, PAD status post left-pop bypass 05/2017 and left BKA, hypertension, hyperlipidemia, diabetes, carotid stenosis, and stage IV CKD admitted with hypoxic respiratory failure, volume overload, and elevated troponin.  Admitted to 2H after a cardiac arrest on 6/8 precipitated by bradycardia.  Assessment & Plan    # Brady-arrest: Mr. Kings became bradycardic to the 20s and lost his pulse.  He had epinephrine and brief CPR with ROSC.  He had bradycardia at the OSH but heart rate had been stable on low dose metoprolol.  Given his NSTEMI and frequent ectopy, we were attempting to maintain some  beta blocker but will now hold.  Limited echo pending.  There were no major electolyte imbalances at the time.  Renal function was improving.  Concerning that this is likely primarily cardiac.  The ability to assess for ischemia has been limited by his renal function and not being a long-term HD candidate.  Will discuss LHC with his family and nephrology as he may need short term HD after.  It would be hard to put in a pacemaker without first ruling out ischemia, especially given his ventricular ectopy and elevated cardiac enzymes.  # Acute diastolic heart failure:  # Pulmonary hypertension:  # Acute on chronic renal failure:  Left ventricular systolic function was relatively preserved on echo this admission.  Right ventricular function is reduced and pulmonary pressures are elevated.  proBNP was elevated to 10,000 at [facility] and chest x-ray was consistent with heart failure.  He initially diuresed well with IV Lasix but then renal function worsened despite remaining volume overloaded.  He is not a candidate for hemodialysis so did not undergo LHC.  Appreciate nephrology guiding diuresis and renal function has been improving.  Will discuss possibility of LHC as above.   # CAD s/p CABG:  # Elevated troponin: # Hyperlipidemia:  CABGin 2010 with LIMA-LAD, SVG-D1, SVG-LCx and SVG-PDA.  High-sensitivity troponin was elevated to 937 at outside hospital.  Now downtrending.  He has not had chest pain.  Will cycle troponin and EKG given his cardiac arrest..  # PVCs:  Frequent PVCs on telemetry with ventricular bigeminy and NSVT.   Hold metoprolol 2/2 bradycardia. His arrest was not triggered by VT, so no amiodarone for now.  # PAD: S/p L BKA.  Wheelchair bound.  # Bladder obstruction: Foley placed 6/7.  Total critical care time: 45 minutes. Critical care time was exclusive of separately billable procedures and treating other patients. Critical care was necessary to treat or prevent imminent or  life-threatening deterioration. Critical care was time spent personally by me on the following activities: development of treatment plan with patient and/or surrogate as well as nursing, discussions with consultants, evaluation of patient's response to treatment, examination of patient, obtaining history from patient or surrogate, ordering and performing treatments and interventions, ordering and review of laboratory studies, ordering and review of radiographic studies, pulse oximetry and re-evaluation of patient's condition.   For questions or updates, please contact [facility] Please consult [email or web address] for contact info   under        Signed, Skeet Latch, MD  05/22/2021, 7:57 AM

## 2021-05-22 NOTE — Progress Notes (Signed)
   05/22/21 0700  Clinical Encounter Type  Visited With Patient not available  Visit Type Code  Referral From Nurse  Consult/Referral To Chaplain  The chaplain responded to page for code blue. No family is present. Patient is being assessed and will be transferred to [facility]. The chaplain will follow up as needed.

## 2021-05-22 NOTE — Progress Notes (Signed)
OT Cancellation Note  Patient Details Name: [name] MRN: [id number] DOB: 12-15-1935   Cancelled Treatment:    Reason Eval/Treat Not Completed: Medical issues which prohibited therapy- pt transferred to ICU this am after bradycardia and PEA arrest, now intubated.  Spoke to RN and will hold at this time.  Will follow acutely.  Jolaine Artist, OT Acute Rehabilitation Services Pager [phone] Office [phone]   Delight Stare 05/22/2021, 10:19 AM

## 2021-05-22 NOTE — Progress Notes (Signed)
  Echocardiogram 2D Echocardiogram has been performed.  [name] 05/22/2021, 9:12 AM

## 2021-05-22 NOTE — Anesthesia Procedure Notes (Signed)
Procedure Name: Intubation Date/Time: 05/22/2021 7:32 AM Performed by: Betha Loa, CRNA Pre-anesthesia Checklist: Patient identified, Emergency Drugs available, Suction available, Patient being monitored and Timeout performed Patient Re-evaluated:Patient Re-evaluated prior to induction Oxygen Delivery Method: Ambu bag Preoxygenation: Pre-oxygenation with 100% oxygen Ventilation: Mask ventilation without difficulty Laryngoscope Size: Glidescope, 4 and Mac Grade View: Grade I Tube type: Subglottic suction tube Tube size: 7.5 mm Number of attempts: 1 Airway Equipment and Method: Stylet and Video-laryngoscopy Placement Confirmation: ETT inserted through vocal cords under direct vision,  breath sounds checked- equal and bilateral,  CO2 detector and positive ETCO2 Secured at: 23 cm Tube secured with: Tape Dental Injury: Teeth and Oropharynx as per pre-operative assessment

## 2021-05-22 NOTE — Progress Notes (Signed)
Peripherally Inserted Central Catheter Placement  The IV Nurse has discussed with the patient and/or persons authorized to consent for the patient, the purpose of this procedure and the potential benefits and risks involved with this procedure.  The benefits include less needle sticks, lab draws from the catheter, and the patient may be discharged home with the catheter. Risks include, but not limited to, infection, bleeding, blood clot (thrombus formation), and puncture of an artery; nerve damage and irregular heartbeat and possibility to perform a PICC exchange if needed/ordered by physician.  Alternatives to this procedure were also discussed.  Bard Power PICC patient education guide, fact sheet on infection prevention and patient information card has been provided to patient /or left at bedside.    PICC Placement Documentation  PICC Triple Lumen 05/22/21 PICC Right Brachial 39 cm 0 cm (Active)  Indication for Insertion or Continuance of Line Prolonged intravenous therapies 05/22/21 1129  Exposed Catheter (cm) 0 cm 05/22/21 1129  Site Assessment Clean;Dry;Intact 05/22/21 1129  Lumen #1 Status Flushed;Blood return noted;Saline locked 05/22/21 1129  Lumen #2 Status Flushed;Blood return noted;Saline locked 05/22/21 1129  Lumen #3 Status Flushed;Blood return noted;Saline locked 05/22/21 1129  Dressing Type Transparent 05/22/21 1129  Dressing Status Clean;Dry;Intact 05/22/21 1129  Antimicrobial disc in place? Yes 05/22/21 1129  Dressing Change Due 05/29/21 05/22/21 1129       Scotty Court 05/22/2021, 11:31 AM

## 2021-05-22 NOTE — Code Documentation (Signed)
  Patient Name: [name]   MRN: [id number]   Date of Birth/ Sex: 11-Jan-1935 , male      Admission Date: 05/18/2021  Attending Provider: Sanda Klein, MD  Primary Diagnosis: <principal problem not specified>   Indication: Pt was in his usual state of health until this AM, when he was noted to be bradycardic. Code blue was subsequently called. At the time of arrival on scene, ACLS protocol was underway.   Technical Description:  - CPR performance duration:  3 minutes  - Was defibrillation or cardioversion used? No   - Was external pacer placed? Yes  - Was patient intubated pre/post CPR? Yes   Medications Administered: Y = Yes; Blank = No Amiodarone    Atropine    Calcium    Epinephrine  X  Lidocaine    Magnesium    Norepinephrine    Phenylephrine    Sodium bicarbonate    Vasopressin     Post CPR evaluation:  - Final Status - Was patient successfully resuscitated ? Yes - What is current rhythm? Sinus rhythm - What is current hemodynamic status? Guarded  Miscellaneous Information:  - Labs sent, including: CBC, CMP, troponin, Lactic acid, Magnesium  - Primary team notified?  Yes  - Family Notified? Yes  - Additional notes/ transfer status: Transferred to ICU.    Dr. Jose Persia Internal Medicine PGY-2  Pager: [phone] After 5pm on weekdays and 1pm on weekends: On Call pager [phone]  05/22/2021, 7:54 AM

## 2021-05-23 ENCOUNTER — Inpatient Hospital Stay (HOSPITAL_COMMUNITY): Payer: Medicare HMO

## 2021-05-23 ENCOUNTER — Encounter (HOSPITAL_COMMUNITY): Payer: Self-pay | Admitting: Cardiovascular Disease

## 2021-05-23 DIAGNOSIS — I5041 Acute combined systolic (congestive) and diastolic (congestive) heart failure: Secondary | ICD-10-CM

## 2021-05-23 DIAGNOSIS — I462 Cardiac arrest due to underlying cardiac condition: Secondary | ICD-10-CM

## 2021-05-23 DIAGNOSIS — R0602 Shortness of breath: Secondary | ICD-10-CM

## 2021-05-23 LAB — RENAL FUNCTION PANEL
Albumin: 2.7 g/dL — ABNORMAL LOW (ref 3.5–5.0)
Anion gap: 13 (ref 5–15)
BUN: 83 mg/dL — ABNORMAL HIGH (ref 8–23)
CO2: 22 mmol/L (ref 22–32)
Calcium: 8 mg/dL — ABNORMAL LOW (ref 8.9–10.3)
Chloride: 98 mmol/L (ref 98–111)
Creatinine, Ser: 3.45 mg/dL — ABNORMAL HIGH (ref 0.61–1.24)
GFR, Estimated: 17 mL/min — ABNORMAL LOW (ref >60)
Glucose, Bld: 241 mg/dL — ABNORMAL HIGH (ref 70–99)
Phosphorus: 3.1 mg/dL (ref 2.5–4.6)
Potassium: 3.5 mmol/L (ref 3.5–5.1)
Sodium: 133 mmol/L — ABNORMAL LOW (ref 135–145)

## 2021-05-23 LAB — GLUCOSE, CAPILLARY
Glucose-Capillary: 101 mg/dL — ABNORMAL HIGH (ref 70–99)
Glucose-Capillary: 161 mg/dL — ABNORMAL HIGH (ref 70–99)
Glucose-Capillary: 163 mg/dL — ABNORMAL HIGH (ref 70–99)
Glucose-Capillary: 195 mg/dL — ABNORMAL HIGH (ref 70–99)
Glucose-Capillary: 210 mg/dL — ABNORMAL HIGH (ref 70–99)
Glucose-Capillary: 213 mg/dL — ABNORMAL HIGH (ref 70–99)
Glucose-Capillary: 216 mg/dL — ABNORMAL HIGH (ref 70–99)
Glucose-Capillary: 233 mg/dL — ABNORMAL HIGH (ref 70–99)

## 2021-05-23 LAB — CBC
HCT: 26.9 % — ABNORMAL LOW (ref 39.0–52.0)
Hemoglobin: 8.7 g/dL — ABNORMAL LOW (ref 13.0–17.0)
MCH: 31.6 pg (ref 26.0–34.0)
MCHC: 32.3 g/dL (ref 30.0–36.0)
MCV: 97.8 fL (ref 80.0–100.0)
Platelets: 143 10*3/uL — ABNORMAL LOW (ref 150–400)
RBC: 2.75 MIL/uL — ABNORMAL LOW (ref 4.22–5.81)
RDW: 13.2 % (ref 11.5–15.5)
WBC: 6.9 10*3/uL (ref 4.0–10.5)
nRBC: 0 % (ref 0.0–0.2)

## 2021-05-23 LAB — COOXEMETRY PANEL
Carboxyhemoglobin: 1.3 % (ref 0.5–1.5)
Methemoglobin: 1.2 % (ref 0.0–1.5)
O2 Saturation: 66.3 %
Total hemoglobin: 6.1 g/dL — CL (ref 12.0–16.0)

## 2021-05-23 LAB — LACTIC ACID, PLASMA: Lactic Acid, Venous: 1.3 mmol/L (ref 0.5–1.9)

## 2021-05-23 LAB — TRIGLYCERIDES: Triglycerides: 119 mg/dL (ref <150)

## 2021-05-23 LAB — MRSA PCR SCREENING: MRSA by PCR: NEGATIVE

## 2021-05-23 MED ORDER — GABAPENTIN 600 MG PO TABS
600.0000 mg | ORAL_TABLET | Freq: Two times a day (BID) | ORAL | Status: DC
  Administered 2021-05-23 – 2021-05-25 (×4): 600 mg via ORAL
  Filled 2021-05-23 (×4): qty 1

## 2021-05-23 MED ORDER — PIPERACILLIN-TAZOBACTAM 3.375 G IVPB 30 MIN
3.3750 g | Freq: Once | INTRAVENOUS | Status: AC
  Administered 2021-05-23: 3.375 g via INTRAVENOUS
  Filled 2021-05-23 (×2): qty 50

## 2021-05-23 MED ORDER — TRAZODONE HCL 50 MG PO TABS
50.0000 mg | ORAL_TABLET | Freq: Every evening | ORAL | Status: DC | PRN
  Administered 2021-05-23 – 2021-05-27 (×5): 50 mg via ORAL
  Filled 2021-05-23 (×5): qty 1

## 2021-05-23 MED ORDER — FUROSEMIDE 10 MG/ML IJ SOLN
80.0000 mg | Freq: Every day | INTRAMUSCULAR | Status: DC
  Administered 2021-05-23 – 2021-05-29 (×7): 80 mg via INTRAVENOUS
  Filled 2021-05-23 (×8): qty 8

## 2021-05-23 MED ORDER — INSULIN ASPART 100 UNIT/ML IJ SOLN
4.0000 [IU] | INTRAMUSCULAR | Status: DC
  Administered 2021-05-23 (×2): 4 [IU] via SUBCUTANEOUS

## 2021-05-23 MED ORDER — POLYETHYLENE GLYCOL 3350 17 G PO PACK
17.0000 g | PACK | Freq: Every day | ORAL | Status: DC
  Administered 2021-05-24 – 2021-05-28 (×4): 17 g via ORAL
  Filled 2021-05-23 (×5): qty 1

## 2021-05-23 MED ORDER — POTASSIUM CHLORIDE 20 MEQ PO PACK
40.0000 meq | PACK | Freq: Once | ORAL | Status: AC
  Administered 2021-05-23: 40 meq
  Filled 2021-05-23: qty 2

## 2021-05-23 MED ORDER — GABAPENTIN 600 MG PO TABS: 300.0000 mg | ORAL_TABLET | Freq: Every day | ORAL | Status: DC

## 2021-05-23 MED ORDER — PIPERACILLIN-TAZOBACTAM 3.375 G IVPB: 3.3750 g | Freq: Three times a day (TID) | INTRAVENOUS | Status: DC

## 2021-05-23 MED ORDER — OXYCODONE HCL 5 MG PO TABS
5.0000 mg | ORAL_TABLET | ORAL | Status: DC | PRN
  Administered 2021-05-23 – 2021-05-30 (×10): 5 mg via ORAL
  Filled 2021-05-23 (×10): qty 1

## 2021-05-23 MED ORDER — PIPERACILLIN-TAZOBACTAM IN DEX 2-0.25 GM/50ML IV SOLN
2.2500 g | Freq: Four times a day (QID) | INTRAVENOUS | Status: DC
  Administered 2021-05-23 – 2021-05-24 (×4): 2.25 g via INTRAVENOUS
  Filled 2021-05-23 (×6): qty 50

## 2021-05-23 MED ORDER — PROSOURCE TF PO LIQD: 45.0000 mL | Freq: Two times a day (BID) | ORAL | Status: DC

## 2021-05-23 MED ORDER — LEVOTHYROXINE SODIUM 50 MCG PO TABS
50.0000 ug | ORAL_TABLET | Freq: Every day | ORAL | Status: DC
  Administered 2021-05-24 – 2021-05-30 (×7): 50 ug via ORAL
  Filled 2021-05-23 (×7): qty 1

## 2021-05-23 MED ORDER — ASPIRIN 81 MG PO CHEW
81.0000 mg | CHEWABLE_TABLET | Freq: Every day | ORAL | Status: DC
  Administered 2021-05-24 – 2021-05-30 (×7): 81 mg via ORAL
  Filled 2021-05-23 (×7): qty 1

## 2021-05-23 MED ORDER — ATORVASTATIN CALCIUM 40 MG PO TABS
40.0000 mg | ORAL_TABLET | Freq: Every day | ORAL | Status: DC
  Administered 2021-05-23 – 2021-05-27 (×5): 40 mg via ORAL
  Filled 2021-05-23 (×5): qty 1

## 2021-05-23 MED ORDER — DOCUSATE SODIUM 100 MG PO CAPS
100.0000 mg | ORAL_CAPSULE | Freq: Two times a day (BID) | ORAL | Status: DC
  Administered 2021-05-23 – 2021-05-30 (×14): 100 mg via ORAL
  Filled 2021-05-23 (×14): qty 1

## 2021-05-23 MED ORDER — OXYCODONE HCL 5 MG PO TABS: 5.0000 mg | ORAL_TABLET | ORAL | Status: DC | PRN

## 2021-05-23 MED ORDER — VITAL HIGH PROTEIN PO LIQD: 1000.0000 mL | ORAL | Status: DC

## 2021-05-23 MED ORDER — IPRATROPIUM-ALBUTEROL 0.5-2.5 (3) MG/3ML IN SOLN
3.0000 mL | Freq: Three times a day (TID) | RESPIRATORY_TRACT | Status: DC
  Administered 2021-05-24 – 2021-05-28 (×13): 3 mL via RESPIRATORY_TRACT
  Filled 2021-05-23 (×14): qty 3

## 2021-05-23 MED ORDER — ACETAMINOPHEN 325 MG PO TABS: 650.0000 mg | ORAL_TABLET | ORAL | Status: DC | PRN

## 2021-05-23 MED FILL — Heparin Sodium (Porcine) Inj 1000 Unit/ML: INTRAMUSCULAR | Qty: 10 | Status: AC

## 2021-05-23 MED FILL — Verapamil HCl IV Soln 2.5 MG/ML: INTRAVENOUS | Qty: 2 | Status: AC

## 2021-05-23 NOTE — Progress Notes (Signed)
NAME:  [name], MRN:  [id number], DOB:  1935/08/30, LOS: 5 ADMISSION DATE:  05/18/2021, CONSULTATION DATE:  05/22/2021 REFERRING MD:  Code Blue Team CHIEF COMPLAINT:  Cardiac arrest/intubated  History of Present Illness:  86 year old male with PMHx significant for HTN, HLD, T2DM, CKD stage IV, CAD (s/p CABG x 4 2010), HFpEF (Echo 05/2018 with  LVEF 55-60%), and PAD (s/p L fem-pop bypass 05/2018 with subsequent L BKA) who initially presented to [facility] ED 6/3 for worsening dyspnea, two-pillow orthopnea and LE edema (progressive x 1 month.   On presentation to [facility] ED, found to be hypoxic and bradycardic with ventricular bigeminy. Labs demonstrated AKI, WBC 9.9, BNP 10k, troponins 275-937. CXR demonstrated interstitial edema without consolidation. Patient was given ASA, Lasix and NTG and transferred [facility] with Cardiology admitting for acute-on-chronic diastolic HF frequent PVCs, troponinemia and acute-on-chronic CKD. Nephrology was consulted 6/5 for worsening renal failure, volume overload, pulmonary edema and ongoing SOB. Repeat Echo noted LVEF 50-55% with decreased RV function and elevated PAP. Lasix dose increased; patient and his daughter were not in favor of dialysis (patient deemed not a candidate for long term dialysis).  On 6/8AM, patient became bradycardic and deteriorated into PEA arrest requiring 1 round of CPR and 1 dose of Epi with subsequent ROSC (down ~3 minutes). During the code, patient intubated by anesthesia and PCCM consulted with transfer to [facility] ICU.  Pertinent  Medical History  CAD status post CABG x4 in 2010, HFpEF (Echo 05/2018 with EF 55-60%), PAD s/p left femoropopliteal bypass 05/2018 and s/p L BKA, HTN, HLD, DMT2, and CKD stage IV   Significant Hospital Events: Including procedures, antibiotic start and stop dates in addition to other pertinent events   6/3 Presented to [facility] 6/3 for SOB, dyspnea, orthopnea, LE swelling 6/4 Transfer to [facility], admitted by Cardiology 6/5  Nephrology consulted for [facility] RF. Echo with decreased LVEF/RV function. Diuresed, no HD. 6/8 Code Blue on the floor, bradycardic deteriorating to PEA arrest requiring CPR x 1 round, Epi x 1 (down 3 minutes); intubated and transferred to [place], PCCM consulted 6/9 Tolerating vent wean PS 5/5, extubated to BiPAP  Interim History / Subjective:  Feeling well this morning Remains intubated, up to chair Tolerating vent wean PS 5/5, FiO2 40% Motioning that he wants the tube out C/o throat/upper chest discomfort, no other complaints  Objective   Blood pressure (!) 139/44, pulse 83, temperature 99.4 F (37.4 C), temperature source Oral, resp. rate (!) 22, height '5\' 4"'$  (1.626 m), weight 87.1 kg, SpO2 100 %. CVP:  [9 mmHg] 9 mmHg  Vent Mode: PSV;CPAP FiO2 (%):  [35 %-50 %] 35 % Set Rate:  [24 bmp] 24 bmp Vt Set:  [470 mL] 470 mL PEEP:  [5 cmH20] 5 cmH20 Pressure Support:  [5 cmH20] 5 cmH20 Plateau Pressure:  [24 cmH20-25 cmH20] 25 cmH20   Intake/Output Summary (Last 24 hours) at 05/23/2021 1226 Last data filed at 05/23/2021 1100 Gross per 24 hour  Intake 2143.96 ml  Output 1540 ml  Net 603.96 ml    Filed Weights   05/22/21 0422 05/22/21 1619 05/23/21 0545  Weight: 84.7 kg 84.7 kg 87.1 kg   Physical Examination: General: Chronically ill-appearing elderly man in NAD. HEENT: [place]/AT, anicteric sclera, PERRL, moist mucous membranes. ETT in place. Neuro: Awake, oriented x 4. Responds to verbal stimuli. Following commands consistently. Moves all 4 extremities spontaneously.  CV: RRR, no m/g/r. PULM: Breathing even and unlabored on vent (PS 5/5, FiO2 40%). Lung fields CTAB,  diminished at bilateral bases. GI: Soft, nontender, nondistended. Normoactive bowel sounds. Extremities: Trace BLE edema noted. L BKA noted with well-healed stump. Skin: Warm/dry, no rashes.  Labs/imaging that I have personally reviewed: (right click and "Reselect all SmartList Selections" daily)  WBC 6.9 (13.9), H&H 8.7/26.9,  Plt 143  Na 133, K 3.5, CO2 22, BUN 83 (88), Cr 3.45 (3.9) Ca 8.0, Phos 3.1  Glucoses 213-269 last 24H  Echo 6/8: LVEF 30-35%  LHC results reviewed  Resolved Hospital Problem list    Assessment & Plan:   Cardiac arrest - Bradycardic/ PEA Brief Code Blue 6/8 with 3 minutes downtime prior to ROSC (1 round CPR, 1 dose Epi). Post-arrest, followed commands appropriately and TTM protocol deferred. Suspect arrest was precipitated by a respiratory/ hypoxic/aspiration event, given unrevealing cardiac workup. - Hemodynamically stable, remains off of pressors  Frequent PVCs NSTEMI AoC HFpEF CAD s/p CABG  Hx HTN, HLD Pulmonary Hypertension S/p LHC 6/8 demonstrating severe double vessel CAD (s/p 4v CABG with 3/4 patent bypass grafts), LAD with severe ostial stenosis and circumflex with moderate mid stenosis. Echo 6/9 with decreased LVEF 30-35%. - Cardiology following, appreciate assistance with management - Continue medical management, as no targets for PCI - Replete electrolytes as indicated (goal K > 4, Mg  > 2) - Trend Co-ox (as above)   Acute hypoxic respiratory failure in the post cardiac arrest setting Suspected aspiration PNA - copious secretions and few plugs suctioned by RT after intubation Never smoker - Extubated today 6/9 to BiPAP - Continue BiPAP x 2 hours post-extubation - Wean FiO2 for O2 sat > 90% - Pulmonary hygiene - DuoNebs Q4H PRN - F/u tracheal aspirate - Continue Zosyn (6/9 >> )  AoC CKD stage IV Not a candidate for long-term dialysis and patient/daughter not in favor of dialysis. Would consider short course CRRT only if necessary. Baseline Cr ~3. - Nephrology following, appreciate recs - No indication for RRT at present, renal function slowly improving - Trend BMP - Replete electrolytes as indicated - Diuresis per Nephrology - Monitor I&Os - Avoid nephrotoxic agents as able - Ensure adequate renal perfusion  T2DM, uncontrolled - Basal insulin + SSI -  CBGs Q4H while NPO, then ACHS  Anemia of chronic illness  - Trend H&H - Transfuse for Hgb < 7.0  Best practice (right click and "Reselect all SmartList Selections" daily)  Diet:  NPO Pain/Anxiety/Delirium protocol (if indicated): Yes (RASS goal -1) VAP protocol (if indicated): Yes DVT prophylaxis: LMWH and SCD GI prophylaxis: PPI Glucose control:  SSI Yes and Basal insulin Yes Central venous access:  N/A Arterial line:  N/A Foley:  N/A Mobility:  bed rest  PT consulted: Yes Last date of multidisciplinary goals of care discussion [ongoing, per primary] Code Status:  full code Disposition: ICU  Critical care time: 46 mins   Lestine Mount, PA-C [facility] 05/23/21 12:58 PM  Please see [email or web address] for pager details.  From 7A-7P if no response, please call [phone] After hours, please call ELink [phone]

## 2021-05-23 NOTE — Progress Notes (Signed)
Progress Note  Patient Name: [name] Date of Encounter: 05/23/2021  [facility] Cardiologist: Sanda Klein, MD   Subjective   Intubated.  Moving on vent.  Denies chest pain.  Eager to get the ET tube out.  Inpatient Medications    Scheduled Meds:  aspirin  81 mg Per Tube Daily   atorvastatin  40 mg Per Tube QHS   chlorhexidine gluconate (MEDLINE KIT)  15 mL Mouth Rinse BID   Chlorhexidine Gluconate Cloth  6 each Topical Daily   docusate  100 mg Per Tube BID   gabapentin  300 mg Per Tube Q24H   gabapentin  600 mg Per Tube BID AC & HS   heparin injection (subcutaneous)  5,000 Units Subcutaneous Q8H   insulin aspart  0-15 Units Subcutaneous Q4H   insulin detemir  10 Units Subcutaneous BID   ipratropium-albuterol  3 mL Nebulization Q4H   levothyroxine  50 mcg Per Tube QAC breakfast   mouth rinse  15 mL Mouth Rinse 10 times per day   pantoprazole (PROTONIX) IV  40 mg Intravenous Daily   polyethylene glycol  17 g Per Tube Daily   sodium chloride flush  10-40 mL Intracatheter Q12H   sodium chloride flush  3 mL Intravenous Q12H   Continuous Infusions:  sodium chloride Stopped (05/23/21 0856)   sodium chloride     norepinephrine (LEVOPHED) Adult infusion 2 mcg/min (05/23/21 0900)   piperacillin-tazobactam     piperacillin-tazobactam (ZOSYN)  IV     PRN Meds: sodium chloride, acetaminophen (TYLENOL) oral liquid 160 mg/5 mL, fentaNYL (SUBLIMAZE) injection, fentaNYL (SUBLIMAZE) injection, ipratropium-albuterol, midazolam, midazolam, naphazoline-pheniramine, nitroGLYCERIN, ondansetron (ZOFRAN) IV, sodium chloride flush, sodium chloride flush, traZODone   Vital Signs    Vitals:   05/23/21 0815 05/23/21 0845 05/23/21 0900 05/23/21 0915  BP: 121/69 (!) 111/43 111/79 (!) 120/51  Pulse: (!) 34 (!) 42 (!) 29 (!) 36  Resp: (!) 25 (!) 24 (!) 25 (!) 24  Temp:      TempSrc:      SpO2: 100% 99% 100% 100%  Weight:      Height:        Intake/Output Summary (Last 24  hours) at 05/23/2021 0927 Last data filed at 05/23/2021 0900 Gross per 24 hour  Intake 1970.4 ml  Output 1540 ml  Net 430.4 ml   Last 3 Weights 05/23/2021 05/22/2021 05/22/2021  Weight (lbs) 192 lb 0.3 oz 186 lb 11.7 oz 186 lb 11.7 oz  Weight (kg) 87.1 kg 84.7 kg 84.7 kg      Telemetry    Frequent PVCs. Short episodes of NSVT and ventricular bigeminy - Personally Reviewed  ECG    05/20/21: Sinus rhythm. Rate 62 bpm.  First degree AV block.  Frequent PVCs. Prior inferior infarct - Personally Reviewed  Physical Exam   VS:  BP (!) 120/51   Pulse (!) 36   Temp 99.4 F (37.4 C) (Oral)   Resp (!) 24   Ht '5\' 4"'  (1.626 m)   Wt 87.1 kg   SpO2 100%   BMI 32.96 kg/m  , BMI Body mass index is 32.96 kg/m. GENERAL:  Critically ill-appearing.  Intubated and sedated. HEENT: Pupils equal round and reactive, fundi not visualized, oral mucosa unremarkable NECK:  No jugular venous distention, waveform within normal limits, carotid upstroke brisk and symmetric, no bruits LUNGS: Coarse breath sounds HEART:  RRR.  PMI not displaced or sustained,S1 and S2 within normal limits, no S3, no S4, no clicks, no rubs,  no murmurs ABD:  Flat, positive bowel sounds normal in frequency in pitch, no bruits, no rebound, no guarding, no midline pulsatile mass, no hepatomegaly, no splenomegaly EXT:  L BKA.  1+ R LE edema, no cyanosis no clubbing SKIN:  No rashes no nodules NEURO:  Opening eyes.  Moving all four extremities. PSYCH:  Unable to assess  Labs    High Sensitivity Troponin:   Recent Labs  Lab 05/19/21 0610 05/22/21 0741 05/22/21 0926  TROPONINIHS 593* 155* 125*      Chemistry Recent Labs  Lab 05/22/21 0026 05/22/21 0741 05/22/21 0912 05/23/21 0325  NA 132* 132* 133* 133*  K 4.8 4.4 4.2 3.5  CL 97* 96*  --  98  CO2 21* 21*  --  22  GLUCOSE 226* 332*  --  241*  BUN 85* 88*  --  83*  CREATININE 3.81* 3.96*  --  3.45*  CALCIUM 8.2* 8.4*  --  8.0*  PROT  --  6.6  --   --   ALBUMIN 3.2* 3.2*   --  2.7*  AST  --  50*  --   --   ALT  --  42  --   --   ALKPHOS  --  71  --   --   BILITOT  --  0.7  --   --   GFRNONAA 15* 14*  --  17*  ANIONGAP 14 15  --  13     Hematology Recent Labs  Lab 05/22/21 0103 05/22/21 0741 05/22/21 0912 05/23/21 0325  WBC 6.2 13.9*  --  6.9  RBC 3.13* 3.41*  --  2.75*  HGB 9.8* 10.8* 9.9* 8.7*  HCT 31.8* 34.7* 29.0* 26.9*  MCV 101.6* 101.8*  --  97.8  MCH 31.3 31.7  --  31.6  MCHC 30.8 31.1  --  32.3  RDW 12.7 12.8  --  13.2  PLT 158 182  --  143*    BNP Recent Labs  Lab 05/19/21 1422  BNP 799.7*     DDimer  Recent Labs  Lab 05/20/21 1015  DDIMER 1.82*     Radiology    DG Abd 1 View  Result Date: 05/22/2021 CLINICAL DATA:  Patient status post repositioning and OG tube. EXAM: ABDOMEN - 1 VIEW COMPARISON:  None. FINDINGS: OG tube is seen with its tip and side-port in the stomach. IMPRESSION: OG tube in good position. Electronically Signed   By: Inge Rise M.D.   On: 05/22/2021 10:30   CARDIAC CATHETERIZATION  Result Date: 05/23/2021  Prox RCA lesion is 50% stenosed.  Prox RCA to Mid RCA lesion is 50% stenosed.  Ost LAD to Prox LAD lesion is 99% stenosed.  LIMA graft was visualized by angiography.  2nd Mrg lesion is 100% stenosed.  Prox Cx to Mid Cx lesion is 40% stenosed.  SVG graft was visualized by angiography.  SVG graft was visualized by angiography.  SVG graft was visualized by angiography.  LPAV lesion is 100% stenosed.  Origin to Prox Graft lesion is 100% stenosed.  Prox Graft lesion is 40% stenosed.  1. 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 The LAD has a severe ostial stenosis. The mid and distal LAD fills from the patent LIMA graft to the mid LAD. The Diagonal fills from the patent vein graft. 3. The Circumflex has moderate mid stenosis. The first obtuse marginal branch is occluded. The vein graft to the Circumflex is patent with mild disease  in the proximal body of the graft. The  vein graft to the left sided PDA is occluded. The left sided PDA appears to be chronically occluded. 4. The small non-dominant RCA has moderate disease. 5. LVEDP 13 mmHg Recommendations: No targets for PCI. Continue medical management of CAD.   DG CHEST PORT 1 VIEW  Result Date: 05/22/2021 CLINICAL DATA:  Intubation. EXAM: PORTABLE CHEST 1 VIEW COMPARISON:  05/19/2021. FINDINGS: Endotracheal tube noted with its tip 2 cm above the carina. Prior CABG. Cardiomegaly. Diffuse bilateral pulmonary infiltrates/edema. Bibasilar atelectasis. Tiny left pleural effusion cannot be excluded. Elevation left hemidiaphragm. No pneumothorax. IMPRESSION: Endotracheal tube noted with its tip 2 cm above the carina anatomic position. 2.  Prior CABG.  Heart size stable. 3. Diffuse bilateral pulmonary infiltrates/edema. Bibasilar atelectasis. Tiny left pleural effusion cannot be excluded. Elevation left hemidiaphragm. Electronically Signed   By: Marcello Moores  Register   On: 05/22/2021 08:28   ECHOCARDIOGRAM COMPLETE  Result Date: 05/22/2021    ECHOCARDIOGRAM REPORT   Patient Name:   JANN RA Date of Exam: 05/22/2021 Medical Rec #:  [id number]        Height:       64.0 in Accession #:    [id number]       Weight:       186.7 lb Date of Birth:  1935-11-20        BSA:          1.900 m Patient Age:    43 years         BP:           124/62 mmHg Patient Gender: M                HR:           92 bpm. Exam Location:  Inpatient Procedure: 2D Echo, Cardiac Doppler and Color Doppler                    STAT ECHO Reported to: Dr. Marlou Porch on 05/22/2021 9:11:00 AM. Indications:    Cardiac arrest I46.9  History:        Patient has prior history of Echocardiogram examinations, most                 recent 05/19/2021. CHF, Previous Myocardial Infarction and CAD,                 Arrythmias:Atrial Fibrillation, Signs/Symptoms:Dyspnea; Risk                 Factors:Hypertension, Dyslipidemia and Diabetes.  Sonographer:    Bernadene Person RDCS Referring Phys:  [id number] [place]  1. Left ventricular ejection fraction, by estimation, is 30 to 35%. The left ventricle has moderately decreased function. The left ventricle demonstrates global hypokinesis. There is mild left ventricular hypertrophy. Left ventricular diastolic parameters are consistent with Grade I diastolic dysfunction (impaired relaxation). There is the interventricular septum is flattened in systole and diastole, consistent with right ventricular pressure and volume overload.  2. Right ventricular systolic function is normal. The right ventricular size is mildly enlarged. There is moderately elevated pulmonary artery systolic pressure. The estimated right ventricular systolic pressure is 16.3 mmHg.  3. Left atrial size was mild to moderately dilated.  4. Right atrial size was mildly dilated.  5. The mitral valve is normal in structure. Mild mitral valve regurgitation. No evidence of mitral stenosis. Severe mitral annular calcification.  6. Tricuspid valve regurgitation is moderate.  7. The aortic valve  is normal in structure. There is mild calcification of the aortic valve. There is mild thickening of the aortic valve. Aortic valve regurgitation is not visualized. Mild aortic valve sclerosis is present, with no evidence of aortic valve stenosis.  8. The inferior vena cava is dilated in size with <50% respiratory variability, suggesting right atrial pressure of 15 mmHg. FINDINGS  Left Ventricle: Left ventricular ejection fraction, by estimation, is 30 to 35%. The left ventricle has moderately decreased function. The left ventricle demonstrates global hypokinesis. The left ventricular internal cavity size was normal in size. There is mild left ventricular hypertrophy. The interventricular septum is flattened in systole and diastole, consistent with right ventricular pressure and volume overload. Left ventricular diastolic parameters are consistent with Grade I diastolic dysfunction (impaired  relaxation).  LV Wall Scoring: The inferior wall is akinetic. Right Ventricle: The right ventricular size is mildly enlarged. No increase in right ventricular wall thickness. Right ventricular systolic function is normal. There is moderately elevated pulmonary artery systolic pressure. The tricuspid regurgitant velocity is 2.93 m/s, and with an assumed right atrial pressure of 15 mmHg, the estimated right ventricular systolic pressure is 25.3 mmHg. Left Atrium: Left atrial size was mild to moderately dilated. Right Atrium: Right atrial size was mildly dilated. Pericardium: There is no evidence of pericardial effusion. Mitral Valve: The mitral valve is normal in structure. Severe mitral annular calcification. Mild mitral valve regurgitation. No evidence of mitral valve stenosis. Tricuspid Valve: The tricuspid valve is normal in structure. Tricuspid valve regurgitation is moderate . No evidence of tricuspid stenosis. Aortic Valve: The aortic valve is normal in structure. There is mild calcification of the aortic valve. There is mild thickening of the aortic valve. Aortic valve regurgitation is not visualized. Mild aortic valve sclerosis is present, with no evidence of aortic valve stenosis. Pulmonic Valve: The pulmonic valve was normal in structure. Pulmonic valve regurgitation is trivial. No evidence of pulmonic stenosis. Aorta: The aortic root is normal in size and structure. Venous: The inferior vena cava is dilated in size with less than 50% respiratory variability, suggesting right atrial pressure of 15 mmHg. IAS/Shunts: No atrial level shunt detected by color flow Doppler.  LEFT VENTRICLE PLAX 2D LVIDd:         4.80 cm LVIDs:         3.30 cm LV PW:         1.20 cm LV IVS:        1.20 cm LVOT diam:     1.90 cm LV SV:         44 LV SV Index:   23 LVOT Area:     2.84 cm  RIGHT VENTRICLE TAPSE (M-mode): 1.6 cm LEFT ATRIUM             Index       RIGHT ATRIUM           Index LA diam:        3.10 cm 1.63 cm/m  RA  Area:     21.10 cm LA Vol (A2C):   84.8 ml 44.63 ml/m RA Volume:   68.10 ml  35.84 ml/m LA Vol (A4C):   74.8 ml 39.37 ml/m LA Biplane Vol: 79.3 ml 41.73 ml/m  AORTIC VALVE LVOT Vmax:   94.98 cm/s LVOT Vmean:  63.250 cm/s LVOT VTI:    0.157 m  AORTA Ao Root diam: 3.30 cm Ao Asc diam:  3.30 cm TRICUSPID VALVE TR Peak grad:   34.3 mmHg TR Vmax:  293.00 cm/s  SHUNTS Systemic VTI:  0.16 m Systemic Diam: 1.90 cm Candee Furbish MD Electronically signed by Candee Furbish MD Signature Date/Time: 05/22/2021/11:09:57 AM    Final    [place] EKG SITE RITE  Result Date: 05/22/2021 If Site Rite image not attached, placement could not be confirmed due to current cardiac rhythm.    Cardiac Studies   ECHO 05/19/2021   1. Left ventricular ejection fraction, by estimation, is 50 to 55%. The  left ventricle has low normal function. The left ventricle demonstrates  regional wall motion abnormalities (see scoring diagram/findings for  description). There is mild left  ventricular hypertrophy. Left ventricular diastolic parameters are  indeterminate.   2. Right ventricular systolic function is moderately reduced. The right  ventricular size is moderately enlarged. There is severely elevated  pulmonary artery systolic pressure. The estimated right ventricular  systolic pressure is 99.3 mmHg.   3. Left atrial size was mildly dilated.   4. The mitral valve is abnormal. Trivial mitral valve regurgitation.  Severe mitral annular calcification.   5. The aortic valve is tricuspid. There is mild calcification of the  aortic valve. Aortic valve regurgitation is not visualized. Mild to  moderate aortic valve sclerosis/calcification is present, without any  evidence of aortic stenosis. Aortic valve mean   gradient measures 5.0 mmHg.   6. The inferior vena cava is normal in size with <50% respiratory  variability, suggesting right atrial pressure of 8 mmHg.    Echocardiogram: 05/2018 Study Conclusions   - Left ventricle:  The cavity size was normal. Wall thickness was    increased in a pattern of mild LVH. Systolic function was normal.    The estimated ejection fraction was in the range of 55% to 60%.    Wall motion was normal; there were no regional wall motion    abnormalities. Features are consistent with a pseudonormal left    ventricular filling pattern, with concomitant abnormal relaxation    and increased filling pressure (grade 2 diastolic dysfunction).  - Aortic valve: Mildly calcified annulus. Mildly thickened, mildly    calcified leaflets.  - Mitral valve: Mildly calcified annulus. Moderately thickened,    moderately calcified leaflets .     Patient Profile     86 y.o. male with CAD status post CABG, PAD status post left-pop bypass 05/2017 and left BKA, hypertension, hyperlipidemia, diabetes, carotid stenosis, and stage IV CKD admitted with hypoxic respiratory failure, volume overload, and elevated troponin.  Admitted to [facility] after a cardiac arrest on 6/8 precipitated by bradycardia.  Assessment & Plan    # Brady-arrest: Mr. Regnier became bradycardic to the 20s and lost his pulse.  He had epinephrine and brief CPR with ROSC.  For reported hypoxia preceded this in the event was thought to be PEA in the setting of an aspiration episode.  There was a reported episode of bradycardia at the outside hospital.  Per EP this was likely ventricular bigeminy that was not adequately counted.  There were no electrolyte abnormalities at the time.  He underwent left heart cath which showed 3 out of 4 of his grafts were patent and it was not thought to be primarily an ischemic event.  No indication for pacemaker.  # Acute systolic and diastolic heart failure:  # Pulmonary hypertension:  Left ventricular systolic function was relatively preserved on echo 05/19/2021.  On repeat 05/22/2021 LVEF was reduced to 30 to 35%.  Right atrial pressure was 15 mmHg.  Co-ox was low at 59, consistent  with cardiogenic etiology of his  hypotension.  Continue Levophed.  He is not a good candidate for milrinone given his renal dysfunction and would not use dobutamine given his frequent runs of NSVT.  # Acute on chronic renal failure:  Appreciate nephrology guiding his diuresis.  His renal function has improved with diuretics.  Based on his right atrial pressure on echo he still requires further diuretics once blood pressure will tolerate.  # CAD s/p CABG:  # Elevated troponin: # Hyperlipidemia:  CABG in 2010 with LIMA-LAD, SVG-D1, SVG-LCx and SVG-PDA.  High-sensitivity troponin was elevated to 937 at outside hospital.  Now downtrending.  Left heart cath revealed severe native vessel disease in the LAD and OM 2.  3 out of 4 bypass grafts were patent including the LIMA to the LAD.  There were no targets for PCI.  Continue aspirin, atorvastatin, and holding beta-blocker for now.  Given that this was primarily a respiratory event, would be okay to add back 12.5 mg of metoprolol once blood pressure is more stable.  # PVCs:  Frequent PVCs on telemetry with ventricular bigeminy and NSVT.   Hold metoprolol 2/2 bradycardia. Holding beta blocker as above.   # PAD: S/p L BKA.  Wheelchair bound.  # Bladder obstruction: Foley placed 6/7.  Total critical care time: 40 minutes. Critical care time was exclusive of separately billable procedures and treating other patients. Critical care was necessary to treat or prevent imminent or life-threatening deterioration. Critical care was time spent personally by me on the following activities: development of treatment plan with patient and/or surrogate as well as nursing, discussions with consultants, evaluation of patient's response to treatment, examination of patient, obtaining history from patient or surrogate, ordering and performing treatments and interventions, ordering and review of laboratory studies, ordering and review of radiographic studies, pulse oximetry and re-evaluation of patient's  condition.   For questions or updates, please contact [place] Please consult [email or web address] for contact info under        Signed, Skeet Latch, MD  05/23/2021, 9:27 AM

## 2021-05-23 NOTE — Evaluation (Signed)
Occupational Therapy Evaluation Patient Details Name: STEPFON Nguyen MRN: [id number] DOB: [birth year] Today's Date: 05/23/2021    History of Present Illness 86 yo admitted 6/4 with SOb with respiratory failure and volume overload with CHf exacerbation. 12-Jun-2023 pt coded with bradycardia and PEA arrest with intubation. PMhx: CAD s/p CABG, HTN, HLD, PAD, Lt BKA, CKD, DM   Clinical Impression   PTA patient needing some assist for LB ADLs, using WC for mobility and completing squat pivot transfers.  He was admitted for above and presenting with problem list below, including impaired balance, generalized weakness, decreased activity tolerance.  He requires mod assist +2 for transfers, total assist for LB ADLs and up to min assist for UB ADLs.  Pt intubated during session, VSS on PRVC at 35% FiO2 Spo2 100%. Based on performance today, patient will benefit from further OT services while admitted and after dc at [facility] level to optimize independence with ADLs and return to PLOF.      Follow Up Recommendations  Home health OT;Supervision/Assistance - 24 hour    Equipment Recommendations  None recommended by OT    Recommendations for Other Services       Precautions / Restrictions Precautions Precautions: Fall Precaution Comments: ETT 24cm, vent, NGtube, left BKA Restrictions Weight Bearing Restrictions: No      Mobility Bed Mobility Overal bed mobility: Needs Assistance Bed Mobility: Supine to Sit     Supine to sit: HOB elevated;Min assist     General bed mobility comments: min HHA with increased time and HOB 30 degrees to pivot to left    Transfers Overall transfer level: Needs assistance   Transfers: Sit to/from Stand;Stand Pivot Transfers Sit to Stand: Min assist Stand pivot transfers: Mod assist;+2 physical assistance       General transfer comment: min assist to rise with RUE hooked with therapist and pivot with right knee blocked and +2 for safety toward left to pivot to  chair    Balance Overall balance assessment: Needs assistance   Sitting balance-Leahy Scale: Fair Sitting balance - Comments: EOb with and without UE support   Standing balance support: Bilateral upper extremity supported;During functional activity Standing balance-Leahy Scale: Poor Standing balance comment: relies on B UE and external support                           ADL either performed or assessed with clinical judgement   ADL Overall ADL's : Needs assistance/impaired     Grooming: Minimal assistance;Sitting   Upper Body Bathing: Sitting;Minimal assistance   Lower Body Bathing: Maximal assistance;Sit to/from stand;+2 for physical assistance;+2 for safety/equipment   Upper Body Dressing : Sitting;Minimal assistance   Lower Body Dressing: Sit to/from stand;+2 for physical assistance;+2 for safety/equipment;Total assistance   Toilet Transfer: Moderate assistance;+2 for physical assistance;+2 for safety/equipment;Stand-pivot           Functional mobility during ADLs: Moderate assistance;+2 for physical assistance;+2 for safety/equipment General ADL Comments: pt limited by weakness, L BKA, balance     Vision Baseline Vision/History: Wears glasses Wears Glasses: At all times Patient Visual Report: No change from baseline Vision Assessment?: No apparent visual deficits     Perception     Praxis      Pertinent Vitals/Pain Pain Assessment: No/denies pain Pain Score: 2  Faces Pain Scale: No hurt Pain Location: ETT Pain Intervention(s): Monitored during session     Hand Dominance Right   Extremity/Trunk Assessment Upper Extremity Assessment Upper Extremity Assessment:  Generalized weakness   Lower Extremity Assessment Lower Extremity Assessment: Defer to PT evaluation       Communication     Cognition Arousal/Alertness: Awake/alert Behavior During Therapy: WFL for tasks assessed/performed Overall Cognitive Status: Difficult to assess                                  General Comments: pt communicating with gestures and head nods appropriately throughout   General Comments  VSS    Exercises General Exercises - Lower Extremity Long Arc Quad: AROM;Right;Seated;10 reps Hip Flexion/Marching: AROM;Both;10 reps;Seated   Shoulder Instructions      Home Living Family/patient expects to be discharged to:: Private residence Living Arrangements: Alone Available Help at Discharge: Family;Available 24 hours/day Type of Home: Mobile home Home Access: Ramped entrance     Home Layout: One level     Bathroom Shower/Tub: Occupational psychologist: Standard     Home Equipment: Environmental consultant - 2 wheels;Wheelchair - manual;Shower seat          Prior Functioning/Environment Level of Independence: Needs assistance    ADL's / Homemaking Assistance Needed: family assists with LB dressing and bathing in shower; pt able to complete toileting, toilet transfers and UB without assist from [facility]   Comments: functions from [facility] level and performs squat pivots between surfaces        OT Problem List: Decreased strength;Decreased activity tolerance;Impaired balance (sitting and/or standing);Decreased knowledge of use of DME or AE;Decreased knowledge of precautions;Cardiopulmonary status limiting activity;Obesity      OT Treatment/Interventions: Self-care/ADL training;Energy conservation;DME and/or AE instruction;Therapeutic activities;Patient/family education;Balance training;Therapeutic exercise    OT Goals(Current goals can be found in the care plan section) Acute Rehab OT Goals Patient Stated Goal: return home, fish Time For Goal Achievement: 06/06/21 Potential to Achieve Goals: Good  OT Frequency: Min 2X/week   Barriers to D/C:            Co-evaluation PT/OT/SLP Co-Evaluation/Treatment: Yes Reason for Co-Treatment: Complexity of the patient's impairments (multi-system involvement);To address functional/ADL  transfers;For patient/therapist safety PT goals addressed during session: Mobility/safety with mobility OT goals addressed during session: ADL's and self-care      AM-PAC OT "6 Clicks" Daily Activity     Outcome Measure Help from another person eating meals?: Total Help from another person taking care of personal grooming?: A Little Help from another person toileting, which includes using toliet, bedpan, or urinal?: Total Help from another person bathing (including washing, rinsing, drying)?: A Lot Help from another person to put on and taking off regular upper body clothing?: A Little Help from another person to put on and taking off regular lower body clothing?: Total 6 Click Score: 11   End of Session Equipment Utilized During Treatment: Gait belt (ETT) Nurse Communication: Mobility status  Activity Tolerance: Patient tolerated treatment well Patient left: in chair;with call bell/phone within reach;with family/visitor present  OT Visit Diagnosis: Other abnormalities of gait and mobility (R26.89);Muscle weakness (generalized) (M62.81)                Time: [id number] OT Time Calculation (min): 23 min Charges:  OT General Charges $OT Visit: 1 Visit OT Evaluation $OT Eval High Complexity: 1 High  Jolaine Artist, OT Acute Rehabilitation Services Pager [phone] Office [phone]   Delight Stare 05/23/2021, 12:47 PM

## 2021-05-23 NOTE — Progress Notes (Signed)
Patient ID: [name], male   DOB: 1935/12/10, 86 y.o.   MRN: [id number] S: Pt underwent LHC yesterday which showed 3/4 bypass grafts patent, severe ostial LAD stenosis, vein graft to left PDA is occluded, no targets for PCI.  EP does not think he would benefit from pacer at this time. No other events overnight. O:BP (!) 139/44   Pulse 83   Temp 99.4 F (37.4 C) (Oral)   Resp (!) 22   Ht $R'5\' 4"'no$  (1.626 m)   Wt 87.1 kg   SpO2 99%   BMI 32.96 kg/m   Intake/Output Summary (Last 24 hours) at 05/23/2021 1105 Last data filed at 05/23/2021 1000 Gross per 24 hour  Intake 2081.57 ml  Output 1540 ml  Net 541.57 ml   Intake/Output: I/O last 3 completed shifts: In: 2132.5 [P.O.:240; I.V.:1392.5; NG/GT:400; IV Piggyback:100] Out: 2645 [Urine:2645]  Intake/Output this shift:  Total I/O In: 189 [I.V.:82.8; IV Piggyback:106.3] Out: 45 [Urine:45] Weight change: 0 kg TJQ:[id number] but awake and alert, sitting up in bed CVS:RRR Resp: occ rhonchi Abd: +BS, soft, NT/ND Ext: trace RLE edema, s/p LBKA  Recent Labs  Lab 05/19/21 0610 05/20/21 0130 05/21/21 0102 05/22/21 0026 05/22/21 0741 05/22/21 0912 05/23/21 0325  NA 135 134* 131* 132* 132* 133* 133*  K 4.8 4.7 4.7 4.8 4.4 4.2 3.5  CL 108 102 97* 97* 96*  --  98  CO2 18* 20* 23 21* 21*  --  22  GLUCOSE 40* 263* 344* 226* 332*  --  241*  BUN 70* 74* 80* 85* 88*  --  83*  CREATININE 3.89* 4.18* 4.14* 3.81* 3.96*  --  3.45*  ALBUMIN  --   --  2.9* 3.2* 3.2*  --  2.7*  CALCIUM 8.3* 8.1* 7.9* 8.2* 8.4*  --  8.0*  PHOS  --   --  6.7* 6.7*  --   --  3.1  AST  --   --   --   --  50*  --   --   ALT  --   --   --   --  42  --   --    Liver Function Tests: Recent Labs  Lab 05/22/21 0026 05/22/21 0741 05/23/21 0325  AST  --  50*  --   ALT  --  42  --   ALKPHOS  --  71  --   BILITOT  --  0.7  --   PROT  --  6.6  --   ALBUMIN 3.2* 3.2* 2.7*   No results for input(s): LIPASE, AMYLASE in the last 168 hours. No results for input(s):  AMMONIA in the last 168 hours. CBC: Recent Labs  Lab 05/20/21 0130 05/21/21 0102 05/22/21 0103 05/22/21 0741 05/22/21 0912 05/23/21 0325  WBC 6.8 6.6 6.2 13.9*  --  6.9  HGB 10.1* 9.9* 9.8* 10.8* 9.9* 8.7*  HCT 32.8* 31.8* 31.8* 34.7* 29.0* 26.9*  MCV 101.2* 101.3* 101.6* 101.8*  --  97.8  PLT 176 150 158 182  --  143*   Cardiac Enzymes: No results for input(s): CKTOTAL, CKMB, CKMBINDEX, TROPONINI in the last 168 hours. CBG: Recent Labs  Lab 05/22/21 1710 05/22/21 2016 05/23/21 0001 05/23/21 0331 05/23/21 0806  GLUCAP 245* 236* 213* 233* 216*    Iron Studies: No results for input(s): IRON, TIBC, TRANSFERRIN, FERRITIN in the last 72 hours. Studies/Results: DG Abd 1 View  Result Date: 05/22/2021 CLINICAL DATA:  Patient status post repositioning and OG tube. EXAM: ABDOMEN -  1 VIEW COMPARISON:  None. FINDINGS: OG tube is seen with its tip and side-port in the stomach. IMPRESSION: OG tube in good position. Electronically Signed   By: Inge Rise M.D.   On: 05/22/2021 10:30   CARDIAC CATHETERIZATION  Result Date: 05/23/2021  Prox RCA lesion is 50% stenosed.  Prox RCA to Mid RCA lesion is 50% stenosed.  Ost LAD to Prox LAD lesion is 99% stenosed.  LIMA graft was visualized by angiography.  2nd Mrg lesion is 100% stenosed.  Prox Cx to Mid Cx lesion is 40% stenosed.  SVG graft was visualized by angiography.  SVG graft was visualized by angiography.  SVG graft was visualized by angiography.  LPAV lesion is 100% stenosed.  Origin to Prox Graft lesion is 100% stenosed.  Prox Graft lesion is 40% stenosed.  1. 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 The LAD has a severe ostial stenosis. The mid and distal LAD fills from the patent LIMA graft to the mid LAD. The Diagonal fills from the patent vein graft. 3. The Circumflex has moderate mid stenosis. The first obtuse marginal branch is occluded. The vein graft to the Circumflex is patent with mild  disease in the proximal body of the graft. The vein graft to the left sided PDA is occluded. The left sided PDA appears to be chronically occluded. 4. The small non-dominant RCA has moderate disease. 5. LVEDP 13 mmHg Recommendations: No targets for PCI. Continue medical management of CAD.   DG CHEST PORT 1 VIEW  Result Date: 05/23/2021 CLINICAL DATA:  Endotracheal tube placement. EXAM: PORTABLE CHEST 1 VIEW COMPARISON:  May 22, 2021. FINDINGS: Stable cardiomediastinal silhouette. Status post coronary bypass graft. Endotracheal and nasogastric tubes are in grossly good position. Right-sided PICC line is noted with distal tip in expected position of right atrium. No pneumothorax is noted. Decreased bilateral lung opacities are noted suggesting improving infiltrates or edema. Bony thorax is unremarkable. IMPRESSION: Endotracheal and nasogastric tubes are in grossly good position. Right-sided PICC line is noted with distal tip in expected position of right atrium. Improving bilateral lung opacities as described above. Electronically Signed   By: Marijo Conception M.D.   On: 05/23/2021 10:35   DG CHEST PORT 1 VIEW  Result Date: 05/22/2021 CLINICAL DATA:  Intubation. EXAM: PORTABLE CHEST 1 VIEW COMPARISON:  05/19/2021. FINDINGS: Endotracheal tube noted with its tip 2 cm above the carina. Prior CABG. Cardiomegaly. Diffuse bilateral pulmonary infiltrates/edema. Bibasilar atelectasis. Tiny left pleural effusion cannot be excluded. Elevation left hemidiaphragm. No pneumothorax. IMPRESSION: Endotracheal tube noted with its tip 2 cm above the carina anatomic position. 2.  Prior CABG.  Heart size stable. 3. Diffuse bilateral pulmonary infiltrates/edema. Bibasilar atelectasis. Tiny left pleural effusion cannot be excluded. Elevation left hemidiaphragm. Electronically Signed   By: Marcello Moores  Register   On: 05/22/2021 08:28   ECHOCARDIOGRAM COMPLETE  Result Date: 05/22/2021    ECHOCARDIOGRAM REPORT   Patient Name:   [name] Date of Exam: 05/22/2021 Medical Rec #:  [id number]        Height:       64.0 in Accession #:    [id number]       Weight:       186.7 lb Date of Birth:  1935-07-10        BSA:          1.900 m Patient Age:    89 years         BP:  124/62 mmHg Patient Gender: M                HR:           92 bpm. Exam Location:  Inpatient Procedure: 2D Echo, Cardiac Doppler and Color Doppler                    STAT ECHO Reported to: Dr. Marlou Porch on 05/22/2021 9:11:00 AM. Indications:    Cardiac arrest I46.9  History:        Patient has prior history of Echocardiogram examinations, most                 recent 05/19/2021. CHF, Previous Myocardial Infarction and CAD,                 Arrythmias:Atrial Fibrillation, Signs/Symptoms:Dyspnea; Risk                 Factors:Hypertension, Dyslipidemia and Diabetes.  Sonographer:    Bernadene Person RDCS Referring Phys: [id number] [place]  1. Left ventricular ejection fraction, by estimation, is 30 to 35%. The left ventricle has moderately decreased function. The left ventricle demonstrates global hypokinesis. There is mild left ventricular hypertrophy. Left ventricular diastolic parameters are consistent with Grade I diastolic dysfunction (impaired relaxation). There is the interventricular septum is flattened in systole and diastole, consistent with right ventricular pressure and volume overload.  2. Right ventricular systolic function is normal. The right ventricular size is mildly enlarged. There is moderately elevated pulmonary artery systolic pressure. The estimated right ventricular systolic pressure is 41.6 mmHg.  3. Left atrial size was mild to moderately dilated.  4. Right atrial size was mildly dilated.  5. The mitral valve is normal in structure. Mild mitral valve regurgitation. No evidence of mitral stenosis. Severe mitral annular calcification.  6. Tricuspid valve regurgitation is moderate.  7. The aortic valve is normal in structure. There is mild  calcification of the aortic valve. There is mild thickening of the aortic valve. Aortic valve regurgitation is not visualized. Mild aortic valve sclerosis is present, with no evidence of aortic valve stenosis.  8. The inferior vena cava is dilated in size with <50% respiratory variability, suggesting right atrial pressure of 15 mmHg. FINDINGS  Left Ventricle: Left ventricular ejection fraction, by estimation, is 30 to 35%. The left ventricle has moderately decreased function. The left ventricle demonstrates global hypokinesis. The left ventricular internal cavity size was normal in size. There is mild left ventricular hypertrophy. The interventricular septum is flattened in systole and diastole, consistent with right ventricular pressure and volume overload. Left ventricular diastolic parameters are consistent with Grade I diastolic dysfunction (impaired relaxation).  LV Wall Scoring: The inferior wall is akinetic. Right Ventricle: The right ventricular size is mildly enlarged. No increase in right ventricular wall thickness. Right ventricular systolic function is normal. There is moderately elevated pulmonary artery systolic pressure. The tricuspid regurgitant velocity is 2.93 m/s, and with an assumed right atrial pressure of 15 mmHg, the estimated right ventricular systolic pressure is 60.6 mmHg. Left Atrium: Left atrial size was mild to moderately dilated. Right Atrium: Right atrial size was mildly dilated. Pericardium: There is no evidence of pericardial effusion. Mitral Valve: The mitral valve is normal in structure. Severe mitral annular calcification. Mild mitral valve regurgitation. No evidence of mitral valve stenosis. Tricuspid Valve: The tricuspid valve is normal in structure. Tricuspid valve regurgitation is moderate . No evidence of tricuspid stenosis. Aortic Valve: The aortic valve is normal in  structure. There is mild calcification of the aortic valve. There is mild thickening of the aortic valve.  Aortic valve regurgitation is not visualized. Mild aortic valve sclerosis is present, with no evidence of aortic valve stenosis. Pulmonic Valve: The pulmonic valve was normal in structure. Pulmonic valve regurgitation is trivial. No evidence of pulmonic stenosis. Aorta: The aortic root is normal in size and structure. Venous: The inferior vena cava is dilated in size with less than 50% respiratory variability, suggesting right atrial pressure of 15 mmHg. IAS/Shunts: No atrial level shunt detected by color flow Doppler.  LEFT VENTRICLE PLAX 2D LVIDd:         4.80 cm LVIDs:         3.30 cm LV PW:         1.20 cm LV IVS:        1.20 cm LVOT diam:     1.90 cm LV SV:         44 LV SV Index:   23 LVOT Area:     2.84 cm  RIGHT VENTRICLE TAPSE (M-mode): 1.6 cm LEFT ATRIUM             Index       RIGHT ATRIUM           Index LA diam:        3.10 cm 1.63 cm/m  RA Area:     21.10 cm LA Vol (A2C):   84.8 ml 44.63 ml/m RA Volume:   68.10 ml  35.84 ml/m LA Vol (A4C):   74.8 ml 39.37 ml/m LA Biplane Vol: 79.3 ml 41.73 ml/m  AORTIC VALVE LVOT Vmax:   94.98 cm/s LVOT Vmean:  63.250 cm/s LVOT VTI:    0.157 m  AORTA Ao Root diam: 3.30 cm Ao Asc diam:  3.30 cm TRICUSPID VALVE TR Peak grad:   34.3 mmHg TR Vmax:        293.00 cm/s  SHUNTS Systemic VTI:  0.16 m Systemic Diam: 1.90 cm Candee Furbish MD Electronically signed by Candee Furbish MD Signature Date/Time: 05/22/2021/11:09:57 AM    Final    [place] EKG SITE RITE  Result Date: 05/22/2021 If Site Rite image not attached, placement could not be confirmed due to current cardiac rhythm.   aspirin  81 mg Per Tube Daily   atorvastatin  40 mg Per Tube QHS   chlorhexidine gluconate (MEDLINE KIT)  15 mL Mouth Rinse BID   Chlorhexidine Gluconate Cloth  6 each Topical Daily   docusate  100 mg Per Tube BID   feeding supplement (PROSource TF)  45 mL Per Tube BID   feeding supplement (VITAL HIGH PROTEIN)  1,000 mL Per Tube Q24H   gabapentin  300 mg Per Tube Q24H   gabapentin  600 mg Per  Tube BID AC & HS   heparin injection (subcutaneous)  5,000 Units Subcutaneous Q8H   insulin aspart  0-15 Units Subcutaneous Q4H   insulin aspart  4 Units Subcutaneous Q4H   insulin detemir  10 Units Subcutaneous BID   ipratropium-albuterol  3 mL Nebulization Q4H   levothyroxine  50 mcg Per Tube QAC breakfast   mouth rinse  15 mL Mouth Rinse 10 times per day   pantoprazole (PROTONIX) IV  40 mg Intravenous Daily   polyethylene glycol  17 g Per Tube Daily   sodium chloride flush  10-40 mL Intracatheter Q12H   sodium chloride flush  3 mL Intravenous Q12H    BMET    Component Value  Date/Time   NA 133 (L) 05/23/2021 0325   K 3.5 05/23/2021 0325   CL 98 05/23/2021 0325   CO2 22 05/23/2021 0325   GLUCOSE 241 (H) 05/23/2021 0325   BUN 83 (H) 05/23/2021 0325   CREATININE 3.45 (H) 05/23/2021 0325   CALCIUM 8.0 (L) 05/23/2021 0325   GFRNONAA 17 (L) 05/23/2021 0325   GFRAA 21 (L) 11/27/2019 0633   CBC    Component Value Date/Time   WBC 6.9 05/23/2021 0325   RBC 2.75 (L) 05/23/2021 0325   HGB 8.7 (L) 05/23/2021 0325   HCT 26.9 (L) 05/23/2021 0325   PLT 143 (L) 05/23/2021 0325   MCV 97.8 05/23/2021 0325   MCH 31.6 05/23/2021 0325   MCHC 32.3 05/23/2021 0325   RDW 13.2 05/23/2021 0325   LYMPHSABS 1.5 11/25/2019 1731   MONOABS 0.5 11/25/2019 1731   EOSABS 0.4 11/25/2019 1731   BASOSABS 0.0 11/25/2019 1731     Assessment/Plan:   Bradycardic arrest - not due to electrolyte abnormalities and likely primary cardiac in nature.  Discussed case with Dr. Oval Linsey and agree with [facility].  Would cont to hold diuretics pre-cath and limit contrast.  Pt is a poor long-term dialysis candidate and he and his daughter are in agreement for short term trial of HD if needed post cath. LHC revealed 3/4 patent bypass grafts, no targets for intervention EP evaluated and no pacemaker needed AKI/CKD stage IV - in setting of acute on chronic diastolic CHF and escalating diuretics consistent with cardiorenal  syndrome.  His Scr increased from 3.89 to 4.18 after UOP of 3.9 liters with lasix 120 mg IV tid.  It was decreased to bid on 05/20/21 but still with 3.5 liters of UOP. Lasix held due to [facility] on 05/22/21 but good UOP overnight and improved BUN/Cr OK to resume lasix but will give 80 mg IV daily and follow. He is not a suitable candidate for long-term dialysis given his poor functional status and multiple comorbidities.  Per Dr. Jonnie Finner, he and his daughter are not in favor of dialysis and will continue to treat conservatively. Acute on chronic diastolic CHF - responded to IV lasix 120 mg tid with 3.9 liters but rise in BUN/Cr.  Will decrease frequency to daily and follow.  He was on lasix 40 mg daily but may require switch to torsemide at time of discharge.  He is not sure if he was taking the furosemide regularly "I've been having trouble remembering things".  Has diuresed almost 6 liters since admission. Resume lasix 120 mg IV daily and follow.  Hopefully can transition to po soon. Anemia of CKD stage IV - follow H/H, iron stores.  Hold off on ESA for now. HTN - stable IDDM - per primary  Donetta Potts, MD [facility] [phone]

## 2021-05-23 NOTE — Progress Notes (Signed)
Inpatient Diabetes Program Recommendations  AACE/ADA: New Consensus Statement on Inpatient Glycemic Control (2015)  Target Ranges:  Prepandial:   less than 140 mg/dL      Peak postprandial:   less than 180 mg/dL (1-2 hours)      Critically ill patients:  140 - 180 mg/dL   Lab Results  Component Value Date   GLUCAP 216 (H) 05/23/2021   HGBA1C 7.7 (H) 05/18/2021    Review of Glycemic Control Results for MARKELLE, NELLI (MRN [id number]) as of 05/23/2021 10:18  Ref. Range 05/22/2021 20:16 05/23/2021 00:01 05/23/2021 03:31 05/23/2021 08:06  Glucose-Capillary Latest Ref Range: 70 - 99 mg/dL 236 (H) 213 (H) 233 (H) 216 (H)   Diabetes history:  DM2 Outpatient Diabetes medications:  Lantus 40 units QAM, 25 units QHS, Humalog 5-10 units TID Current orders for Inpatient glycemic control:  Novolog 0-15 units Q4H, Levemir 10 units BID, Novolog 4 units Q4H Vital @ 63m/hr   Inpatient Diabetes Program Recommendations:      Consider increasing Levemir to 14 units BID.   Will continue to follow while inpatient.  Thanks, LBronson Curb MSN, RNC-OB Diabetes Coordinator [phone](8a-5p)

## 2021-05-23 NOTE — Progress Notes (Signed)
Physical Therapy Treatment Patient Details Name: [name] MRN: [id number] DOB: [birth year] Today's Date: 05/23/2021    History of Present Illness 86 yo admitted 6/4 with SOb with respiratory failure and volume overload with CHf exacerbation. 2023/05/25 pt coded with bradycardia and PEA arrest with intubation. PMhx: CAD s/p CABG, HTN, HLD, PAD, Lt BKA, CKD, DM    PT Comments    Pt pleasant, cooperative and clearly communicating that he wants ETT out. Pt educated for purpose of ETT with pt able to perform transfer OOB to chair as well as bil LE HEP with 2 person assist this session for strength, safety and lines. Pt educated for continued progression.   PRVC 35% FiO2 with SpO2 100%, ETT 24 cm    Follow Up Recommendations  Home health PT;Supervision/Assistance - 24 hour     Equipment Recommendations  None recommended by PT    Recommendations for Other Services       Precautions / Restrictions Precautions Precautions: Fall Precaution Comments: ETT 24cm, vent, NGtube, left BKA    Mobility  Bed Mobility Overal bed mobility: Needs Assistance Bed Mobility: Supine to Sit     Supine to sit: HOB elevated;Min assist     General bed mobility comments: min HHA with increased time and HOB 30 degrees to pivot to left    Transfers Overall transfer level: Needs assistance   Transfers: Sit to/from Stand;Stand Pivot Transfers Sit to Stand: Min assist Stand pivot transfers: Mod assist;+2 physical assistance       General transfer comment: min assist to rise with RUE hooked with therapist and pivot with right knee blocked and +2 for safety toward left to pivot to chair  Ambulation/Gait             General Gait Details: unable at baseline   Stairs             Wheelchair Mobility    Modified Rankin (Stroke Patients Only)       Balance Overall balance assessment: Needs assistance   Sitting balance-Leahy Scale: Fair Sitting balance - Comments: EOb with and  without UE support   Standing balance support: Bilateral upper extremity supported Standing balance-Leahy Scale: Poor                              Cognition Arousal/Alertness: Awake/alert Behavior During Therapy: WFL for tasks assessed/performed Overall Cognitive Status: Difficult to assess                                 General Comments: pt communicating with gestures and head nods appropriately throughout      Exercises General Exercises - Lower Extremity Long Arc Quad: AROM;Right;Seated;10 reps Hip Flexion/Marching: AROM;Both;10 reps;Seated    General Comments        Pertinent Vitals/Pain Pain Assessment: Faces Pain Score: 2  Pain Location: ETT Pain Intervention(s): Limited activity within patient's tolerance;Monitored during session;Repositioned    Home Living                      Prior Function            PT Goals (current goals can now be found in the care plan section) Progress towards PT goals: Progressing toward goals    Frequency    Min 3X/week      PT Plan Current plan remains appropriate    Co-evaluation PT/OT/SLP  Co-Evaluation/Treatment: Yes Reason for Co-Treatment: Complexity of the patient's impairments (multi-system involvement);For patient/therapist safety PT goals addressed during session: Mobility/safety with mobility        AM-PAC PT "6 Clicks" Mobility   Outcome Measure  Help needed turning from your back to your side while in a flat bed without using bedrails?: A Little Help needed moving from lying on your back to sitting on the side of a flat bed without using bedrails?: A Little Help needed moving to and from a bed to a chair (including a wheelchair)?: Total Help needed standing up from a chair using your arms (e.g., wheelchair or bedside chair)?: Total Help needed to walk in hospital room?: Total Help needed climbing 3-5 steps with a railing? : Total 6 Click Score: 10    End of Session  Equipment Utilized During Treatment: Gait belt Activity Tolerance: Patient tolerated treatment well Patient left: in chair;with call bell/phone within reach;with chair alarm set;with family/visitor present Nurse Communication: Mobility status;Precautions PT Visit Diagnosis: Other abnormalities of gait and mobility (R26.89);Muscle weakness (generalized) (M62.81)     Time: [id number] PT Time Calculation (min) (ACUTE ONLY): 19 min  Charges:  $Therapeutic Activity: 8-22 mins                     Cinda Hara P, PT Acute Rehabilitation Services Pager: [phone] Office: [place] [name] 05/23/2021, 11:28 AM

## 2021-05-23 NOTE — Progress Notes (Signed)
eLink Physician-Brief Progress Note Patient Name: [name] DOB: 04-13-1935 MRN: [id number]   Date of Service  05/23/2021  HPI/Events of Note  Patient having frequent ectopy.  eICU Interventions  BMP and Mg++ ordered.        Kerry Kass Shirlie Enck 05/23/2021, 10:28 PM

## 2021-05-23 NOTE — Procedures (Signed)
Extubation Procedure Note  Patient Details:   Name: [name] DOB: 1935/10/23 MRN: [id number]   Airway Documentation:    Vent end date: 05/23/21 Vent end time: 1227   Evaluation  O2 sats: stable throughout Complications: No apparent complications Patient did tolerate procedure well. Bilateral Breath Sounds: Clear, Diminished   Yes  Patient tolerated wean. MD ordered to extubate to BiPAP. Positive for cuff leak. Patient extubated to BiPAP 10/5. No signs of dyspnea or stridor noted. RN at bedside. Will continue to monitor.   Myrtie Neither 05/23/2021, 12:50 PM

## 2021-05-23 NOTE — Progress Notes (Signed)
Pharmacy Antibiotic Note  [name] is a 86 y.o. male with concern of aspiration PNA on rocephin. He is noted s/p cardiac arrest with PEA /bradycardia   He is also noted with AKI/CKD IV and not an HD candidate. Pharmacy has been consulted for zosyn dosing. -WBC= 6.9, tmax= 101.3, CrCl ~ 15  Plan: -Zosyn 3.375gm x1 then 2.25gm IV q6h -Will follow renal function, cultures and clinical progress    Height: '5\' 4"'$  (162.6 cm) Weight: 87.1 kg (192 lb 0.3 oz) IBW/kg (Calculated) : 59.2  Temp (24hrs), Avg:99.3 F (37.4 C), Min:98.1 F (36.7 C), Max:101.3 F (38.5 C)  Recent Labs  Lab 05/20/21 0130 05/21/21 0102 05/22/21 0026 05/22/21 0103 05/22/21 0741 05/22/21 1014 05/23/21 0325  WBC 6.8 6.6  --  6.2 13.9*  --  6.9  CREATININE 4.18* 4.14* 3.81*  --  3.96*  --  3.45*  LATICACIDVEN  --   --   --   --  3.6* 2.4*  --     Estimated Creatinine Clearance: 15.9 mL/min (A) (by C-G formula based on SCr of 3.45 mg/dL (H)).    No Known Allergies  Antimicrobials this admission: 6/9 zosyn>> 6/8 CTX>> 6/9  Dose adjustments this admission:   Microbiology results:  6/8 trach  Thank you for allowing pharmacy to be a part of this patient's care. Hildred Laser, PharmD Clinical Pharmacist **Pharmacist phone directory can now be found on [email or web address] ([email or web address]).  Listed under [place].

## 2021-05-23 NOTE — Progress Notes (Signed)
eLink Physician-Brief Progress Note Patient Name: EVERT GRAVELLE DOB: October 26, 1935 MRN: [id number]   Date of Service  05/23/2021  HPI/Events of Note  KCL 35, creatinine 3.45, patient is having ectopy.  eICU Interventions  KCL packet 40 meq via OG tube x 1.        Kerry Kass Corrie Brannen 05/23/2021, 5:18 AM

## 2021-05-24 ENCOUNTER — Inpatient Hospital Stay (HOSPITAL_COMMUNITY): Payer: Medicare HMO

## 2021-05-24 LAB — GLUCOSE, CAPILLARY
Glucose-Capillary: 102 mg/dL — ABNORMAL HIGH (ref 70–99)
Glucose-Capillary: 117 mg/dL — ABNORMAL HIGH (ref 70–99)
Glucose-Capillary: 126 mg/dL — ABNORMAL HIGH (ref 70–99)
Glucose-Capillary: 129 mg/dL — ABNORMAL HIGH (ref 70–99)
Glucose-Capillary: 140 mg/dL — ABNORMAL HIGH (ref 70–99)
Glucose-Capillary: 165 mg/dL — ABNORMAL HIGH (ref 70–99)
Glucose-Capillary: 166 mg/dL — ABNORMAL HIGH (ref 70–99)

## 2021-05-24 LAB — CBC
HCT: 28.4 % — ABNORMAL LOW (ref 39.0–52.0)
Hemoglobin: 8.7 g/dL — ABNORMAL LOW (ref 13.0–17.0)
MCH: 31.4 pg (ref 26.0–34.0)
MCHC: 30.6 g/dL (ref 30.0–36.0)
MCV: 102.5 fL — ABNORMAL HIGH (ref 80.0–100.0)
Platelets: 136 10*3/uL — ABNORMAL LOW (ref 150–400)
RBC: 2.77 MIL/uL — ABNORMAL LOW (ref 4.22–5.81)
RDW: 13.2 % (ref 11.5–15.5)
WBC: 7.6 10*3/uL (ref 4.0–10.5)
nRBC: 0 % (ref 0.0–0.2)

## 2021-05-24 LAB — RENAL FUNCTION PANEL
Albumin: 2.9 g/dL — ABNORMAL LOW (ref 3.5–5.0)
Anion gap: 11 (ref 5–15)
BUN: 82 mg/dL — ABNORMAL HIGH (ref 8–23)
CO2: 23 mmol/L (ref 22–32)
Calcium: 8.6 mg/dL — ABNORMAL LOW (ref 8.9–10.3)
Chloride: 100 mmol/L (ref 98–111)
Creatinine, Ser: 3.75 mg/dL — ABNORMAL HIGH (ref 0.61–1.24)
GFR, Estimated: 15 mL/min — ABNORMAL LOW (ref >60)
Glucose, Bld: 115 mg/dL — ABNORMAL HIGH (ref 70–99)
Phosphorus: 6.1 mg/dL — ABNORMAL HIGH (ref 2.5–4.6)
Potassium: 4.7 mmol/L (ref 3.5–5.1)
Sodium: 134 mmol/L — ABNORMAL LOW (ref 135–145)

## 2021-05-24 LAB — CULTURE, RESPIRATORY W GRAM STAIN: Culture: NORMAL

## 2021-05-24 LAB — BASIC METABOLIC PANEL
Anion gap: 15 (ref 5–15)
BUN: 81 mg/dL — ABNORMAL HIGH (ref 8–23)
CO2: 22 mmol/L (ref 22–32)
Calcium: 8.3 mg/dL — ABNORMAL LOW (ref 8.9–10.3)
Chloride: 98 mmol/L (ref 98–111)
Creatinine, Ser: 3.64 mg/dL — ABNORMAL HIGH (ref 0.61–1.24)
GFR, Estimated: 16 mL/min — ABNORMAL LOW (ref >60)
Glucose, Bld: 168 mg/dL — ABNORMAL HIGH (ref 70–99)
Potassium: 4.7 mmol/L (ref 3.5–5.1)
Sodium: 135 mmol/L (ref 135–145)

## 2021-05-24 LAB — MAGNESIUM
Magnesium: 2 mg/dL (ref 1.7–2.4)
Magnesium: 2 mg/dL (ref 1.7–2.4)

## 2021-05-24 MED ORDER — ORAL CARE MOUTH RINSE
15.0000 mL | Freq: Two times a day (BID) | OROMUCOSAL | Status: DC
  Administered 2021-05-24 – 2021-05-30 (×9): 15 mL via OROMUCOSAL

## 2021-05-24 MED ORDER — DARBEPOETIN ALFA 40 MCG/0.4ML IJ SOSY
40.0000 ug | PREFILLED_SYRINGE | INTRAMUSCULAR | Status: DC
  Administered 2021-05-24: 40 ug via SUBCUTANEOUS
  Filled 2021-05-24: qty 0.4

## 2021-05-24 MED ORDER — GABAPENTIN 600 MG PO TABS
300.0000 mg | ORAL_TABLET | Freq: Every day | ORAL | Status: DC
  Administered 2021-05-26 – 2021-05-29 (×4): 300 mg via ORAL
  Filled 2021-05-24 (×4): qty 1

## 2021-05-24 MED ORDER — HYDRALAZINE HCL 25 MG PO TABS
25.0000 mg | ORAL_TABLET | Freq: Three times a day (TID) | ORAL | Status: DC
  Administered 2021-05-24 – 2021-05-30 (×18): 25 mg via ORAL
  Filled 2021-05-24 (×18): qty 1

## 2021-05-24 MED ORDER — ISOSORBIDE MONONITRATE ER 30 MG PO TB24
15.0000 mg | ORAL_TABLET | Freq: Every day | ORAL | Status: DC
  Administered 2021-05-24 – 2021-05-25 (×2): 15 mg via ORAL
  Filled 2021-05-24 (×2): qty 1

## 2021-05-24 MED ORDER — PIPERACILLIN-TAZOBACTAM IN DEX 2-0.25 GM/50ML IV SOLN
2.2500 g | Freq: Three times a day (TID) | INTRAVENOUS | Status: DC
  Administered 2021-05-24 – 2021-05-30 (×17): 2.25 g via INTRAVENOUS
  Filled 2021-05-24 (×20): qty 50

## 2021-05-24 MED ORDER — CHLORHEXIDINE GLUCONATE 0.12 % MT SOLN
OROMUCOSAL | Status: AC
  Administered 2021-05-24: 15 mL via OROMUCOSAL
  Filled 2021-05-24: qty 15

## 2021-05-24 NOTE — Progress Notes (Signed)
Modified Barium Swallow Progress Note  Patient Details  Name: ANTOAN LAGUNES MRN: [id number] Date of Birth: 02/06/1935  Today's Date: 05/24/2021  Modified Barium Swallow completed.  Full report located under Chart Review in the Imaging Section.  Brief recommendations include the following:  Clinical Impression  Pt's oropharyngeal swallowing is grossly WFL, with a single instance of aspiration that occurred before the swallow due to impaired timing as the pt drank thin liquids via concsecutive straw sips. This did trigger an immediate cough response, which is how his sensory system should react to try to protect his airway, although his cough was not strong enough to eject the aspirates from his airway. No further aspiration occurred despite additional challenging including more sequential boluses, cup vs straw delivery methods, and mixed consistencies. Recommend continuing with regular solids and thin liquids with use of aspiration precautions. Would monitor closely for any additional coughing during PO intake, and SLP will f/u briefly as well.   Swallow Evaluation Recommendations       SLP Diet Recommendations: Regular solids;Thin liquid   Liquid Administration via: Cup;Straw   Medication Administration: Whole meds with liquid   Supervision: Patient able to self feed;Intermittent supervision to cue for compensatory strategies (close supervision with first few meals)   Compensations: Slow rate;Small sips/bites   Postural Changes: Seated upright at 90 degrees   Oral Care Recommendations: Oral care BID        Osie Bond., M.A. [place] Pager [phone] Office [phone]  05/24/2021,1:34 PM

## 2021-05-24 NOTE — Progress Notes (Addendum)
NAME:  [name], MRN:  [id number], DOB:  1935/10/07, LOS: 6 ADMISSION DATE:  05/18/2021, CONSULTATION DATE:  05/22/2021 REFERRING MD:  Code Blue Team CHIEF COMPLAINT:  Cardiac arrest/intubated  History of Present Illness:  86 year old male with PMHx significant for HTN, HLD, T2DM, CKD stage IV, CAD (s/p CABG x 4 2010), HFpEF (Echo 05/2018 with  LVEF 55-60%), and PAD (s/p L fem-pop bypass 05/2018 with subsequent L BKA) who initially presented to [facility] ED 6/3 for worsening dyspnea, two-pillow orthopnea and LE edema (progressive x 1 month.   On presentation to [facility] ED, found to be hypoxic and bradycardic with ventricular bigeminy. Labs demonstrated AKI, WBC 9.9, BNP 10k, troponins 275-937. CXR demonstrated interstitial edema without consolidation. Patient was given ASA, Lasix and NTG and transferred to [facility] with Cardiology admitting for acute-on-chronic diastolic HF frequent PVCs, troponinemia and acute-on-chronic CKD. Nephrology was consulted 6/5 for worsening renal failure, volume overload, pulmonary edema and ongoing SOB. Repeat Echo noted LVEF 50-55% with decreased RV function and elevated PAP. Lasix dose increased; patient and his daughter were not in favor of dialysis (patient deemed not a candidate for long term dialysis).  On 6/8AM, patient became bradycardic and deteriorated into PEA arrest requiring 1 round of CPR and 1 dose of Epi with subsequent ROSC (down ~3 minutes). During the code, patient intubated by anesthesia and PCCM consulted with transfer to [facility] ICU.  Pertinent  Medical History  CAD status post CABG x4 in 2010, HFpEF (Echo 05/2018 with EF 55-60%), PAD s/p left femoropopliteal bypass 05/2018 and s/p L BKA, HTN, HLD, DMT2, and CKD stage IV   Significant Hospital Events: Including procedures, antibiotic start and stop dates in addition to other pertinent events   6/3 Presented to [facility] 6/3 for SOB, dyspnea, orthopnea, LE swelling 6/4 Transfer to [facility], admitted by Cardiology 6/5  Nephrology consulted for [facility] RF. Echo with decreased LVEF/RV function. Diuresed, no HD. 6/8 Code Blue on the floor, bradycardic deteriorating to PEA arrest requiring CPR x 1 round, Epi x 1 (down 3 minutes); intubated and transferred to [facility], PCCM consulted 6/9 Tolerating vent wean PS 5/5, extubated to BiPAP  Interim History / Subjective:  Remained off vent over night Sats good Cough is weak>> Passed bedside swallow but speech agrees should have procedural swallow today to ensure not aspirating Overnight has remained in Bigeminy/ Trigeminy [place] event when OOB to chair 6/10>> resolved spontaneously over about  5 minutes  Objective     Blood pressure (!) 142/70, pulse (!) 55, temperature 97.8 F (36.6 C), temperature source Oral, resp. rate (!) 23, height '5\' 4"'$  (1.626 m), weight 86.8 kg, SpO2 98 %. CVP:  [17 mmHg-20 mmHg] 18 mmHg  Vent Mode: BIPAP;PCV FiO2 (%):  [35 %-40 %] 40 % Set Rate:  [12 bmp] 12 bmp PEEP:  [5 cmH20] 5 cmH20 Pressure Support:  [5 cmH20] 5 cmH20   Intake/Output Summary (Last 24 hours) at 05/24/2021 1005 Last data filed at 05/24/2021 0930 Gross per 24 hour  Intake 1375.99 ml  Output 1405 ml  Net -29.01 ml   Filed Weights   05/22/21 1619 05/23/21 0545 05/24/21 0600  Weight: 84.7 kg 87.1 kg 86.8 kg   Physical Examination: General: Chronically ill-appearing elderly man in NAD. HEENT: [place]/AT, anicteric sclera, PERRL, moist mucous membranes. No LAD, No JVD Neuro: Awake, oriented x 4. Conversational, Following commands consistently. Moves all 4 extremities spontaneously.  CV: Bigeminy per tele, RRR, No RMG PULM: Bilateral chest excursion, Breathing even and unlabored on vent (  PS 5/5, FiO2 40%). Lung fields clear upper lobes, few crackles lower lobes, diminished at bilateral bases. GI: Soft, nontender, nondistended. Normoactive bowel sounds. Extremities: Trace BLE edema noted. L BKA noted with well-healed stump. Skin: Warm/dry, no rashes., intact  Labs/imaging that I  have personally reviewed: (right click and "Reselect all SmartList Selections" daily)  Mag is 2, K is 4.7 Creatinine 3.75 Calcium 8.6, Albumin 2.9>> Corrects to 9.48 HGB 8.7, WBC 7.6 Net - 5 L, 1280 UO over last 24 hours Resolved Hospital Problem list    Assessment & Plan:   Cardiac arrest - Bradycardic/ PEA Brief Code Blue 6/8 with 3 minutes downtime prior to ROSC (1 round CPR, 1 dose Epi). Post-arrest, followed commands appropriately and TTM protocol deferred. Suspect arrest was precipitated by a respiratory/ hypoxic/aspiration event, given unrevealing cardiac workup. - Hemodynamically stable, remains off of pressors - Continue Tele - Atropine to travel with patient if he is off the floor  Frequent PVCs Brady event 6/10 when OOB to chair ( ? Vagal) NSTEMI AoC HFpEF CAD s/p CABG  Hx HTN, HLD Pulmonary Hypertension S/p LHC 6/8 demonstrating severe double vessel CAD (s/p 4v CABG with 3/4 patent bypass grafts), LAD with severe ostial stenosis and circumflex with moderate mid stenosis. Echo 6/9 with decreased LVEF 30-35%. - Cardiology following, appreciate assistance with management - Continue medical management, as no targets for PCI - Replete electrolytes as indicated (goal K > 4, Mg  > 2) - Trend Co-ox (as above)   Acute hypoxic respiratory failure in the post cardiac arrest setting Suspected aspiration PNA - copious secretions and few plugs suctioned by RT after intubation Never smoker - Extubated  6/9 to BiPAP x 2 hours after extubation - Wean FiO2 for O2 sat > 90% - Aggressive Pulmonary hygiene - IS Q 1 - Flutter vale at least 4 times daily - Swallow eval - DuoNebs Q4H PRN - F/u tracheal aspirate - Continue Zosyn (6/9 >> )  AoC CKD stage IV 1280 UO last 24 hours Not a candidate for long-term dialysis and patient/daughter not in favor of dialysis. Would consider short course CRRT only if necessary. Baseline Cr ~3. - Nephrology following, appreciate recs - No  indication for RRT at present,  - Trend BMP - Replete electrolytes as indicated - Diuresis per Nephrology - Monitor I&Os - Avoid nephrotoxic agents as able - Ensure adequate renal perfusion  T2DM, uncontrolled - Basal insulin + SSI - CBGs Q4H while NPO, then ACHS  Anemia of chronic illness  - Trend H&H - Transfuse for Hgb < 7.0    PCCM will sign off as patient is off pressors and successfully extubated. Please call if need arises. Thanks so much  Best practice (right click and "Reselect all SmartList Selections" daily)  Diet:  NPO until after swallow eval Pain/Anxiety/Delirium protocol (if indicated): Yes (RASS goal -1) VAP protocol (if indicated): Yes DVT prophylaxis: LMWH and SCD GI prophylaxis: PPI Glucose control:  SSI Yes and Basal insulin Yes Central venous access:  N/A Arterial line:  N/A Foley:  N/A Mobility:  bed rest  PT consulted: Yes Last date of multidisciplinary goals of care discussion [ongoing, per primary] Code Status:  full code Disposition: ICU  Critical care time: 85 mins   Magdalen Spatz, NP  Pulmonary & Critical Care 05/24/21 10:05 AM  Please see [email or web address] for pager details.  From 7A-7P if no response, please call [phone]>> hospital use only After hours, please call [place]    PCCM Attending:  86 year old gentleman past medical history hypertension, hyperlipidemia, CKD 4, CAD, CABG in 2010, heart failure with preserved ejection fraction, PAD status post femoropopliteal bypass 2019 with subsequent left BKA.  Patient had a hypoxic bradycardic event with ventricular bigeminy.  He deteriorated had a possible PEA round 1 of CPR was intubated with ROSC a total of 3-minute downtime.  Patient was ultimately doing much better this morning decision was made for extubation on mechanical support.  Patient examined in the ICU. BP 136/83   Pulse 85   Temp 97.8 F (36.6 C) (Oral)   Resp 14   Ht '5\' 4"'$  (1.626 m)   Wt 86.8 kg    SpO2 95%   BMI 32.85 kg/m   General: Elderly male awake alert following commands Heart: Regular rhythm S1 and S2 Lungs: Clear to auscultation bilaterally Extremities: Left BKA, no significant edema  Labs: Reviewed  Assessment: Cardiac arrest, PEA, bradycardia, resolved Frequent PVCs NSTEMI Acute on chronic heart failure with preserved ejection fraction CAD status post CABG Pulmonary hypertension Possible aspiration pneumonia Chronic kidney disease Diabetes type 2  Plan: Supportive care postarrest Medical management for heart failure per cardiology Diuresis to maintain euvolemia Pulmonary hygiene Up and out of bed much tolerated PT OT Complete short course of antibiotics with consider 5-day stop date. Diuresis per nephrology/cardiology From respiratory standpoint I think he is stable.  We will sign off at this time. We appreciate the consult.  Please call with any questions.   Garner Nash, DO [facility] 05/24/2021 11:51 AM

## 2021-05-24 NOTE — Progress Notes (Signed)
Progress Note  Patient Name: [name] Date of Encounter: 05/24/2021  [facility] Cardiologist: Sanda Klein, MD   Subjective   Extubated 6/9.  He is very happy to have the ET tube out.  He has some chest pain from CPR.  He has a cough but can't get it up.  Nurse notes that his heart rate dropped to the 50s when he got him up to a chair.  No aspiration noted on bedside swallow.  Inpatient Medications    Scheduled Meds:  aspirin  81 mg Oral Daily   atorvastatin  40 mg Oral QHS   chlorhexidine gluconate (MEDLINE KIT)  15 mL Mouth Rinse BID   Chlorhexidine Gluconate Cloth  6 each Topical Daily   docusate sodium  100 mg Oral BID   furosemide  80 mg Intravenous Daily   gabapentin  300 mg Oral Daily   gabapentin  600 mg Oral BID AC & HS   heparin injection (subcutaneous)  5,000 Units Subcutaneous Q8H   insulin aspart  0-15 Units Subcutaneous Q4H   insulin aspart  4 Units Subcutaneous Q4H   insulin detemir  10 Units Subcutaneous BID   ipratropium-albuterol  3 mL Nebulization TID   levothyroxine  50 mcg Oral QAC breakfast   mouth rinse  15 mL Mouth Rinse BID   pantoprazole (PROTONIX) IV  40 mg Intravenous Daily   polyethylene glycol  17 g Oral Daily   sodium chloride flush  10-40 mL Intracatheter Q12H   sodium chloride flush  3 mL Intravenous Q12H   Continuous Infusions:  sodium chloride 20 mL/hr at 05/24/21 0800   sodium chloride     norepinephrine (LEVOPHED) Adult infusion Stopped (05/23/21 1320)   piperacillin-tazobactam (ZOSYN)  IV Stopped (05/24/21 0442)   PRN Meds: sodium chloride, acetaminophen, fentaNYL (SUBLIMAZE) injection, fentaNYL (SUBLIMAZE) injection, ipratropium-albuterol, midazolam, midazolam, naphazoline-pheniramine, nitroGLYCERIN, ondansetron (ZOFRAN) IV, oxyCODONE, sodium chloride flush, sodium chloride flush, traZODone   Vital Signs    Vitals:   05/24/21 0700 05/24/21 0759 05/24/21 0800 05/24/21 0851  BP: (!) 141/90  (!) 142/70   Pulse: 80   (!) 32   Resp: 16  (!) 23   Temp:  97.8 F (36.6 C)    TempSrc:  Oral    SpO2: 98%  95% 98%  Weight:      Height:        Intake/Output Summary (Last 24 hours) at 05/24/2021 0859 Last data filed at 05/24/2021 0800 Gross per 24 hour  Intake 1525.99 ml  Output 1260 ml  Net 265.99 ml   Last 3 Weights 05/24/2021 05/23/2021 05/22/2021  Weight (lbs) 191 lb 5.8 oz 192 lb 0.3 oz 186 lb 11.7 oz  Weight (kg) 86.8 kg 87.1 kg 84.7 kg      Telemetry    Frequent PVCs. Short episodes of NSVT and ventricular bigeminy - Personally Reviewed  ECG    05/20/21: Sinus rhythm. Rate 62 bpm.  First degree AV block.  Frequent PVCs. Prior inferior infarct - Personally Reviewed  Physical Exam   VS:  BP (!) 142/70   Pulse (!) 32   Temp 97.8 F (36.6 C) (Oral)   Resp (!) 23   Ht '5\' 4"'  (1.626 m)   Wt 86.8 kg   SpO2 98%   BMI 32.85 kg/m  , BMI Body mass index is 32.85 kg/m. GENERAL:  Critically ill-appearing.  Intubated and sedated. HEENT: Pupils equal round and reactive, fundi not visualized, oral mucosa unremarkable NECK:  No jugular venous distention,  waveform within normal limits, carotid upstroke brisk and symmetric, no bruits LUNGS: +bilateral rhonchi and wheezes HEART:  RRR.  PMI not displaced or sustained,S1 and S2 within normal limits, no S3, no S4, no clicks, no rubs, no murmurs ABD:  Flat, positive bowel sounds normal in frequency in pitch, no bruits, no rebound, no guarding, no midline pulsatile mass, no hepatomegaly, no splenomegaly EXT:  L BKA.  1+ R LE edema, no cyanosis no clubbing SKIN:  No rashes no nodules NEURO:  Opening eyes.  Moving all four extremities. PSYCH:  Unable to assess  Labs    High Sensitivity Troponin:   Recent Labs  Lab 05/19/21 0610 05/22/21 0741 05/22/21 0926  TROPONINIHS 593* 155* 125*      Chemistry Recent Labs  Lab 05/22/21 0741 05/22/21 0912 05/23/21 0325 05/23/21 2253 05/24/21 0429  NA 132*   < > 133* 135 134*  K 4.4   < > 3.5 4.7 4.7  CL 96*   --  98 98 100  CO2 21*  --  '22 22 23  ' GLUCOSE 332*  --  241* 168* 115*  BUN 88*  --  83* 81* 82*  CREATININE 3.96*  --  3.45* 3.64* 3.75*  CALCIUM 8.4*  --  8.0* 8.3* 8.6*  PROT 6.6  --   --   --   --   ALBUMIN 3.2*  --  2.7*  --  2.9*  AST 50*  --   --   --   --   ALT 42  --   --   --   --   ALKPHOS 71  --   --   --   --   BILITOT 0.7  --   --   --   --   GFRNONAA 14*  --  17* 16* 15*  ANIONGAP 15  --  '13 15 11   ' < > = values in this interval not displayed.     Hematology Recent Labs  Lab 05/22/21 0741 05/22/21 0912 05/23/21 0325 05/24/21 0429  WBC 13.9*  --  6.9 7.6  RBC 3.41*  --  2.75* 2.77*  HGB 10.8* 9.9* 8.7* 8.7*  HCT 34.7* 29.0* 26.9* 28.4*  MCV 101.8*  --  97.8 102.5*  MCH 31.7  --  31.6 31.4  MCHC 31.1  --  32.3 30.6  RDW 12.8  --  13.2 13.2  PLT 182  --  143* 136*    BNP Recent Labs  Lab 05/19/21 1422  BNP 799.7*     DDimer  Recent Labs  Lab 05/20/21 1015  DDIMER 1.82*     Radiology    DG Abd 1 View  Result Date: 05/22/2021 CLINICAL DATA:  Patient status post repositioning and OG tube. EXAM: ABDOMEN - 1 VIEW COMPARISON:  None. FINDINGS: OG tube is seen with its tip and side-port in the stomach. IMPRESSION: OG tube in good position. Electronically Signed   By: Inge Rise M.D.   On: 05/22/2021 10:30   CARDIAC CATHETERIZATION  Result Date: 05/23/2021  Prox RCA lesion is 50% stenosed.  Prox RCA to Mid RCA lesion is 50% stenosed.  Ost LAD to Prox LAD lesion is 99% stenosed.  LIMA graft was visualized by angiography.  2nd Mrg lesion is 100% stenosed.  Prox Cx to Mid Cx lesion is 40% stenosed.  SVG graft was visualized by angiography.  SVG graft was visualized by angiography.  SVG graft was visualized by angiography.  LPAV lesion is 100% stenosed.  Origin to Prox Graft lesion is 100% stenosed.  Prox Graft lesion is 40% stenosed.  1. 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 The LAD has a severe ostial stenosis.  The mid and distal LAD fills from the patent LIMA graft to the mid LAD. The Diagonal fills from the patent vein graft. 3. The Circumflex has moderate mid stenosis. The first obtuse marginal branch is occluded. The vein graft to the Circumflex is patent with mild disease in the proximal body of the graft. The vein graft to the left sided PDA is occluded. The left sided PDA appears to be chronically occluded. 4. The small non-dominant RCA has moderate disease. 5. LVEDP 13 mmHg Recommendations: No targets for PCI. Continue medical management of CAD.   DG CHEST PORT 1 VIEW  Result Date: 05/23/2021 CLINICAL DATA:  Endotracheal tube placement. EXAM: PORTABLE CHEST 1 VIEW COMPARISON:  May 22, 2021. FINDINGS: Stable cardiomediastinal silhouette. Status post coronary bypass graft. Endotracheal and nasogastric tubes are in grossly good position. Right-sided PICC line is noted with distal tip in expected position of right atrium. No pneumothorax is noted. Decreased bilateral lung opacities are noted suggesting improving infiltrates or edema. Bony thorax is unremarkable. IMPRESSION: Endotracheal and nasogastric tubes are in grossly good position. Right-sided PICC line is noted with distal tip in expected position of right atrium. Improving bilateral lung opacities as described above. Electronically Signed   By: Marijo Conception M.D.   On: 05/23/2021 10:35   ECHOCARDIOGRAM COMPLETE  Result Date: 05/22/2021    ECHOCARDIOGRAM REPORT   Patient Name:   [name] Date of Exam: 05/22/2021 Medical Rec #:  [id number]        Height:       64.0 in Accession #:    [id number]       Weight:       186.7 lb Date of Birth:  1935/03/13        BSA:          1.900 m Patient Age:    86 years         BP:           124/62 mmHg Patient Gender: M                HR:           92 bpm. Exam Location:  Inpatient Procedure: 2D Echo, Cardiac Doppler and Color Doppler                    STAT ECHO Reported to: Dr. Marlou Porch on 05/22/2021 9:11:00 AM.  Indications:    Cardiac arrest I46.9  History:        Patient has prior history of Echocardiogram examinations, most                 recent 05/19/2021. CHF, Previous Myocardial Infarction and CAD,                 Arrythmias:Atrial Fibrillation, Signs/Symptoms:Dyspnea; Risk                 Factors:Hypertension, Dyslipidemia and Diabetes.  Sonographer:    Bernadene Person RDCS Referring Phys: [id number] [place]  1. Left ventricular ejection fraction, by estimation, is 30 to 35%. The left ventricle has moderately decreased function. The left ventricle demonstrates global hypokinesis. There is mild left ventricular hypertrophy. Left ventricular diastolic parameters are consistent with Grade I diastolic dysfunction (impaired relaxation). There is  the interventricular septum is flattened in systole and diastole, consistent with right ventricular pressure and volume overload.  2. Right ventricular systolic function is normal. The right ventricular size is mildly enlarged. There is moderately elevated pulmonary artery systolic pressure. The estimated right ventricular systolic pressure is 47.8 mmHg.  3. Left atrial size was mild to moderately dilated.  4. Right atrial size was mildly dilated.  5. The mitral valve is normal in structure. Mild mitral valve regurgitation. No evidence of mitral stenosis. Severe mitral annular calcification.  6. Tricuspid valve regurgitation is moderate.  7. The aortic valve is normal in structure. There is mild calcification of the aortic valve. There is mild thickening of the aortic valve. Aortic valve regurgitation is not visualized. Mild aortic valve sclerosis is present, with no evidence of aortic valve stenosis.  8. The inferior vena cava is dilated in size with <50% respiratory variability, suggesting right atrial pressure of 15 mmHg. FINDINGS  Left Ventricle: Left ventricular ejection fraction, by estimation, is 30 to 35%. The left ventricle has moderately decreased  function. The left ventricle demonstrates global hypokinesis. The left ventricular internal cavity size was normal in size. There is mild left ventricular hypertrophy. The interventricular septum is flattened in systole and diastole, consistent with right ventricular pressure and volume overload. Left ventricular diastolic parameters are consistent with Grade I diastolic dysfunction (impaired relaxation).  LV Wall Scoring: The inferior wall is akinetic. Right Ventricle: The right ventricular size is mildly enlarged. No increase in right ventricular wall thickness. Right ventricular systolic function is normal. There is moderately elevated pulmonary artery systolic pressure. The tricuspid regurgitant velocity is 2.93 m/s, and with an assumed right atrial pressure of 15 mmHg, the estimated right ventricular systolic pressure is 29.5 mmHg. Left Atrium: Left atrial size was mild to moderately dilated. Right Atrium: Right atrial size was mildly dilated. Pericardium: There is no evidence of pericardial effusion. Mitral Valve: The mitral valve is normal in structure. Severe mitral annular calcification. Mild mitral valve regurgitation. No evidence of mitral valve stenosis. Tricuspid Valve: The tricuspid valve is normal in structure. Tricuspid valve regurgitation is moderate . No evidence of tricuspid stenosis. Aortic Valve: The aortic valve is normal in structure. There is mild calcification of the aortic valve. There is mild thickening of the aortic valve. Aortic valve regurgitation is not visualized. Mild aortic valve sclerosis is present, with no evidence of aortic valve stenosis. Pulmonic Valve: The pulmonic valve was normal in structure. Pulmonic valve regurgitation is trivial. No evidence of pulmonic stenosis. Aorta: The aortic root is normal in size and structure. Venous: The inferior vena cava is dilated in size with less than 50% respiratory variability, suggesting right atrial pressure of 15 mmHg. IAS/Shunts: No  atrial level shunt detected by color flow Doppler.  LEFT VENTRICLE PLAX 2D LVIDd:         4.80 cm LVIDs:         3.30 cm LV PW:         1.20 cm LV IVS:        1.20 cm LVOT diam:     1.90 cm LV SV:         44 LV SV Index:   23 LVOT Area:     2.84 cm  RIGHT VENTRICLE TAPSE (M-mode): 1.6 cm LEFT ATRIUM             Index       RIGHT ATRIUM           Index LA  diam:        3.10 cm 1.63 cm/m  RA Area:     21.10 cm LA Vol (A2C):   84.8 ml 44.63 ml/m RA Volume:   68.10 ml  35.84 ml/m LA Vol (A4C):   74.8 ml 39.37 ml/m LA Biplane Vol: 79.3 ml 41.73 ml/m  AORTIC VALVE LVOT Vmax:   94.98 cm/s LVOT Vmean:  63.250 cm/s LVOT VTI:    0.157 m  AORTA Ao Root diam: 3.30 cm Ao Asc diam:  3.30 cm TRICUSPID VALVE TR Peak grad:   34.3 mmHg TR Vmax:        293.00 cm/s  SHUNTS Systemic VTI:  0.16 m Systemic Diam: 1.90 cm Candee Furbish MD Electronically signed by Candee Furbish MD Signature Date/Time: 05/22/2021/11:09:57 AM    Final    [place] EKG SITE RITE  Result Date: 05/22/2021 If Site Rite image not attached, placement could not be confirmed due to current cardiac rhythm.    Cardiac Studies   ECHO 05/19/2021   1. Left ventricular ejection fraction, by estimation, is 50 to 55%. The  left ventricle has low normal function. The left ventricle demonstrates  regional wall motion abnormalities (see scoring diagram/findings for  description). There is mild left  ventricular hypertrophy. Left ventricular diastolic parameters are  indeterminate.   2. Right ventricular systolic function is moderately reduced. The right  ventricular size is moderately enlarged. There is severely elevated  pulmonary artery systolic pressure. The estimated right ventricular  systolic pressure is 82.5 mmHg.   3. Left atrial size was mildly dilated.   4. The mitral valve is abnormal. Trivial mitral valve regurgitation.  Severe mitral annular calcification.   5. The aortic valve is tricuspid. There is mild calcification of the  aortic valve. Aortic  valve regurgitation is not visualized. Mild to  moderate aortic valve sclerosis/calcification is present, without any  evidence of aortic stenosis. Aortic valve mean   gradient measures 5.0 mmHg.   6. The inferior vena cava is normal in size with <50% respiratory  variability, suggesting right atrial pressure of 8 mmHg.    Echocardiogram: 05/2018 Study Conclusions   - Left ventricle: The cavity size was normal. Wall thickness was    increased in a pattern of mild LVH. Systolic function was normal.    The estimated ejection fraction was in the range of 55% to 60%.    Wall motion was normal; there were no regional wall motion    abnormalities. Features are consistent with a pseudonormal left    ventricular filling pattern, with concomitant abnormal relaxation    and increased filling pressure (grade 2 diastolic dysfunction).  - Aortic valve: Mildly calcified annulus. Mildly thickened, mildly    calcified leaflets.  - Mitral valve: Mildly calcified annulus. Moderately thickened,    moderately calcified leaflets .     Patient Profile     86 y.o. male with CAD status post CABG, PAD status post left-pop bypass 05/2017 and left BKA, hypertension, hyperlipidemia, diabetes, carotid stenosis, and stage IV CKD admitted with hypoxic respiratory failure, volume overload, and elevated troponin.  Admitted to [facility] after a cardiac arrest on 6/8 precipitated by bradycardia.  Assessment & Plan    # Brady-arrest: Mr. Grabel became bradycardic to the 20s and lost his pulse.  He was on metoprolol 12.62m bid at the time. He had epinephrine and brief CPR with ROSC.  He was evaluated by EP and not thought to be a candidate.  They felt it was PEA and  not primarily bradycardia. He underwent left heart cath which showed 3 out of 4 of his grafts were patent and it was not thought to be primarily an ischemic event.  HS troponin was downtrending.  This AM his heart rate dropped to the 50s when getting out of bed.  He  has a long first degree AV block and very frequent ventricular ectopy.  Would continue to avoid nodal agents for now and continue to monitor closely on telemetry. He was evaluated with bedside swallow and there was no frank aspiration.    # Acute systolic and diastolic heart failure:  # Pulmonary hypertension:  Left ventricular systolic function was relatively preserved on echo 05/19/2021.  On repeat 05/22/2021 LVEF was reduced to 30 to 35%.  Right atrial pressure was 15 mmHg.  Co-ox was low at 59.  He I now off levophed.  Will add low-dose hydralazine and nitrates.  No beta blocker as above.  No ACE-I/ARB given his renal dysfunction.  No targets for PCI on cath 05/22/21 and 3/4 grafts were patent.  # Acute on chronic renal failure:  Appreciate nephrology guiding his diuresis.  His renal function has improved with diuretics.  No significant diuresis yesterday with lasix 76m IV.  # CAD s/p CABG:  # Elevated troponin: # Hyperlipidemia:  CABG in 2010 with LIMA-LAD, SVG-D1, SVG-LCx and SVG-PDA.  High-sensitivity troponin was elevated to 937 at outside hospital.  Now downtrending.  Left heart cath revealed severe native vessel disease in the LAD and OM 2.  3 out of 4 bypass grafts were patent including the LIMA to the LAD.  There were no targets for PCI.  Continue aspirin, atorvastatin, and holding beta-blocker for now.    # PVCs:  Frequent PVCs on telemetry with ventricular bigeminy and NSVT.   Hold metoprolol 2/2 bradycardia.  # PAD: S/p L BKA.  Wheelchair bound.  # Bladder obstruction: Foley placed 6/7.  Total critical care time: 45 minutes. Critical care time was exclusive of separately billable procedures and treating other patients. Critical care was necessary to treat or prevent imminent or life-threatening deterioration. Critical care was time spent personally by me on the following activities: development of treatment plan with patient and/or surrogate as well as nursing, discussions with  consultants, evaluation of patient's response to treatment, examination of patient, obtaining history from patient or surrogate, ordering and performing treatments and interventions, ordering and review of laboratory studies, ordering and review of radiographic studies, pulse oximetry and re-evaluation of patient's condition.   For questions or updates, please contact CEastportPlease consult [email or web address] for contact info under        Signed, TSkeet Latch MD  05/24/2021, 8:59 AM

## 2021-05-24 NOTE — Progress Notes (Addendum)
Inpatient Rehab Admissions Coordinator Note:   Per therapy recommendations, pt was screened for CIR candidacy by Haniel Fix, MS CCC-SLP. At this time, Pt. Appears to have functional decline and is a good candidate for CIR. Will request order for rehab consult per protocol.  Please contact me with questions.   Jasaun Carn, MS, CCC-SLP Rehab Admissions Coordinator  [phone] (celll) [phone] (office)  

## 2021-05-24 NOTE — Progress Notes (Signed)
Patient ID: [name], male   DOB: 1935-11-18, 86 y.o.   MRN: [id number] S: Extubated yesterday.  Feels much better. O:BP (!) 142/70   Pulse (!) 55   Temp 97.8 F (36.6 C) (Oral)   Resp (!) 23   Ht '5\' 4"'  (1.626 m)   Wt 86.8 kg   SpO2 98%   BMI 32.85 kg/m   Intake/Output Summary (Last 24 hours) at 05/24/2021 0952 Last data filed at 05/24/2021 0930 Gross per 24 hour  Intake 1487.16 ml  Output 1405 ml  Net 82.16 ml   Intake/Output: I/O last 3 completed shifts: In: 2251.9 [P.O.:400; I.V.:1152; NG/GT:400; IV Piggyback:299.9] Out: 1925 [Urine:1925]  Intake/Output this shift:  Total I/O In: 380 [P.O.:360; I.V.:20] Out: 170 [Urine:170] Weight change: 2.1 kg Gen: NAD KNL:[id number] at 55 Resp: occ end-expiratory wheezes bilaterally Abd: +BS, soft, NT/ND Ext: trace pretibial edema of right leg, s/p LBKA  Recent Labs  Lab 05/20/21 0130 05/21/21 0102 05/22/21 0026 05/22/21 0741 05/22/21 0912 05/23/21 0325 05/23/21 2253 05/24/21 0429  NA 134* 131* 132* 132* 133* 133* 135 134*  K 4.7 4.7 4.8 4.4 4.2 3.5 4.7 4.7  CL 102 97* 97* 96*  --  98 98 100  CO2 20* 23 21* 21*  --  '22 22 23  ' GLUCOSE 263* 344* 226* 332*  --  241* 168* 115*  BUN 74* 80* 85* 88*  --  83* 81* 82*  CREATININE 4.18* 4.14* 3.81* 3.96*  --  3.45* 3.64* 3.75*  ALBUMIN  --  2.9* 3.2* 3.2*  --  2.7*  --  2.9*  CALCIUM 8.1* 7.9* 8.2* 8.4*  --  8.0* 8.3* 8.6*  PHOS  --  6.7* 6.7*  --   --  3.1  --  6.1*  AST  --   --   --  50*  --   --   --   --   ALT  --   --   --  42  --   --   --   --    Liver Function Tests: Recent Labs  Lab 05/22/21 0741 05/23/21 0325 05/24/21 0429  AST 50*  --   --   ALT 42  --   --   ALKPHOS 71  --   --   BILITOT 0.7  --   --   PROT 6.6  --   --   ALBUMIN 3.2* 2.7* 2.9*   No results for input(s): LIPASE, AMYLASE in the last 168 hours. No results for input(s): AMMONIA in the last 168 hours. CBC: Recent Labs  Lab 05/21/21 0102 05/22/21 0103 05/22/21 0741 05/22/21 0912  05/23/21 0325 05/24/21 0429  WBC 6.6 6.2 13.9*  --  6.9 7.6  HGB 9.9* 9.8* 10.8* 9.9* 8.7* 8.7*  HCT 31.8* 31.8* 34.7* 29.0* 26.9* 28.4*  MCV 101.3* 101.6* 101.8*  --  97.8 102.5*  PLT 150 158 182  --  143* 136*   Cardiac Enzymes: No results for input(s): CKTOTAL, CKMB, CKMBINDEX, TROPONINI in the last 168 hours. CBG: Recent Labs  Lab 05/23/21 2018 05/23/21 2224 05/24/21 0006 05/24/21 0409 05/24/21 0754  GLUCAP 101* 195* 140* 102* 126*    Iron Studies: No results for input(s): IRON, TIBC, TRANSFERRIN, FERRITIN in the last 72 hours. Studies/Results: DG Abd 1 View  Result Date: 05/22/2021 CLINICAL DATA:  Patient status post repositioning and OG tube. EXAM: ABDOMEN - 1 VIEW COMPARISON:  None. FINDINGS: OG tube is seen with its tip and side-port in the stomach.  IMPRESSION: OG tube in good position. Electronically Signed   By: Inge Rise M.D.   On: 05/22/2021 10:30   CARDIAC CATHETERIZATION  Result Date: 05/23/2021  Prox RCA lesion is 50% stenosed.  Prox RCA to Mid RCA lesion is 50% stenosed.  Ost LAD to Prox LAD lesion is 99% stenosed.  LIMA graft was visualized by angiography.  2nd Mrg lesion is 100% stenosed.  Prox Cx to Mid Cx lesion is 40% stenosed.  SVG graft was visualized by angiography.  SVG graft was visualized by angiography.  SVG graft was visualized by angiography.  LPAV lesion is 100% stenosed.  Origin to Prox Graft lesion is 100% stenosed.  Prox Graft lesion is 40% stenosed.  1. 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 The LAD has a severe ostial stenosis. The mid and distal LAD fills from the patent LIMA graft to the mid LAD. The Diagonal fills from the patent vein graft. 3. The Circumflex has moderate mid stenosis. The first obtuse marginal branch is occluded. The vein graft to the Circumflex is patent with mild disease in the proximal body of the graft. The vein graft to the left sided PDA is occluded. The left sided PDA appears  to be chronically occluded. 4. The small non-dominant RCA has moderate disease. 5. LVEDP 13 mmHg Recommendations: No targets for PCI. Continue medical management of CAD.   DG CHEST PORT 1 VIEW  Result Date: 05/23/2021 CLINICAL DATA:  Endotracheal tube placement. EXAM: PORTABLE CHEST 1 VIEW COMPARISON:  May 22, 2021. FINDINGS: Stable cardiomediastinal silhouette. Status post coronary bypass graft. Endotracheal and nasogastric tubes are in grossly good position. Right-sided PICC line is noted with distal tip in expected position of right atrium. No pneumothorax is noted. Decreased bilateral lung opacities are noted suggesting improving infiltrates or edema. Bony thorax is unremarkable. IMPRESSION: Endotracheal and nasogastric tubes are in grossly good position. Right-sided PICC line is noted with distal tip in expected position of right atrium. Improving bilateral lung opacities as described above. Electronically Signed   By: Marijo Conception M.D.   On: 05/23/2021 10:35    aspirin  81 mg Oral Daily   atorvastatin  40 mg Oral QHS   chlorhexidine gluconate (MEDLINE KIT)  15 mL Mouth Rinse BID   Chlorhexidine Gluconate Cloth  6 each Topical Daily   docusate sodium  100 mg Oral BID   furosemide  80 mg Intravenous Daily   gabapentin  300 mg Oral Daily   gabapentin  600 mg Oral BID AC & HS   heparin injection (subcutaneous)  5,000 Units Subcutaneous Q8H   hydrALAZINE  25 mg Oral Q8H   insulin aspart  0-15 Units Subcutaneous Q4H   insulin aspart  4 Units Subcutaneous Q4H   insulin detemir  10 Units Subcutaneous BID   ipratropium-albuterol  3 mL Nebulization TID   isosorbide mononitrate  15 mg Oral Daily   levothyroxine  50 mcg Oral QAC breakfast   mouth rinse  15 mL Mouth Rinse BID   pantoprazole (PROTONIX) IV  40 mg Intravenous Daily   polyethylene glycol  17 g Oral Daily   sodium chloride flush  10-40 mL Intracatheter Q12H   sodium chloride flush  3 mL Intravenous Q12H    BMET    Component Value  Date/Time   NA 134 (L) 05/24/2021 0429   K 4.7 05/24/2021 0429   CL 100 05/24/2021 0429   CO2 23 05/24/2021 0429   GLUCOSE 115 (  H) 05/24/2021 0429   BUN 82 (H) 05/24/2021 0429   CREATININE 3.75 (H) 05/24/2021 0429   CALCIUM 8.6 (L) 05/24/2021 0429   GFRNONAA 15 (L) 05/24/2021 0429   GFRAA 21 (L) 11/27/2019 0633   CBC    Component Value Date/Time   WBC 7.6 05/24/2021 0429   RBC 2.77 (L) 05/24/2021 0429   HGB 8.7 (L) 05/24/2021 0429   HCT 28.4 (L) 05/24/2021 0429   PLT 136 (L) 05/24/2021 0429   MCV 102.5 (H) 05/24/2021 0429   MCH 31.4 05/24/2021 0429   MCHC 30.6 05/24/2021 0429   RDW 13.2 05/24/2021 0429   LYMPHSABS 1.5 11/25/2019 1731   MONOABS 0.5 11/25/2019 1731   EOSABS 0.4 11/25/2019 1731   BASOSABS 0.0 11/25/2019 1731     Assessment/Plan:   Bradycardic arrest - not due to electrolyte abnormalities and likely primary cardiac in nature.  Discussed case with Dr. Oval Linsey and agree with [facility].  Would cont to hold diuretics pre-cath and limit contrast.  Pt is a poor long-term dialysis candidate and he and his daughter are in agreement for short term trial of HD if needed post cath. LHC revealed 3/4 patent bypass grafts, no targets for intervention EP evaluated and no pacemaker needed AKI/CKD stage IV - in setting of acute on chronic diastolic CHF and escalating diuretics consistent with cardiorenal syndrome.  His Scr increased from 3.89 to 4.18 after UOP of 3.9 liters with lasix 120 mg IV tid.  It was decreased to bid on 05/20/21 but still with 3.5 liters of UOP. Lasix held due to [facility] on 05/22/21 but good UOP overnight and improved BUN/Cr initially but now slowly rising. OK to resume lasix but will give 80 mg IV daily and follow. He is not a suitable candidate for long-term dialysis given his poor functional status and multiple comorbidities.  Per Dr. Jonnie Finner, he and his daughter are not in favor of dialysis and will continue to treat conservatively. Acute on chronic diastolic CHF -  responded to IV lasix 120 mg tid with 3.9 liters but rise in BUN/Cr.  Will decrease frequency to daily and follow.  He was on lasix 40 mg daily but may require switch to torsemide at time of discharge.  He is not sure if he was taking the furosemide regularly "I've been having trouble remembering things".  Has diuresed almost 6 liters since admission. lasix 80 mg IV daily and follow.   Hopefully can transition to po soon. Anemia of CKD stage IV - follow H/H, iron stores.  will start ESA HTN - stable IDDM - per primary  Donetta Potts, MD [facility] [phone]

## 2021-05-24 NOTE — Progress Notes (Signed)
Physical Therapy Treatment Patient Details Name: [name] MRN: [id number] DOB: [birth year] Today's Date: 05/24/2021    History of Present Illness 86 yo admitted 6/4 with SOb with respiratory failure and volume overload with CHf exacerbation. May 29, 2023 pt coded with bradycardia and PEA arrest with intubation. 6/9 extubated. PMhx: CAD s/p CABG, HTN, HLD, PAD, Lt BKA, CKD, DM    PT Comments    Pt pleasant and eager to mobilize with HR 90 on arrival and bradycardia to 55 with decreased responsiveness and BP 169/84 (99) with transition to chair. Pt returned to bed due to decreased presentation for safety. Pt with significant medical complications since admission with fatigue and weakness resulting in decreased mobility and pt not currently ready to return home with family assist and would benefit from CIR.   93-98% on 3L     Follow Up Recommendations  CIR;Supervision/Assistance - 24 hour     Equipment Recommendations  None recommended by PT    Recommendations for Other Services       Precautions / Restrictions Precautions Precautions: Fall Precaution Comments: Left BKA, watch HR    Mobility  Bed Mobility Overal bed mobility: Needs Assistance Bed Mobility: Supine to Sit;Sit to Supine     Supine to sit: HOB elevated;Min assist Sit to supine: Mod assist   General bed mobility comments: min assist with HOB 20 degrees to pivot to left. Return to supine with assist of pad and RLE    Transfers Overall transfer level: Needs assistance   Transfers: Squat Pivot Transfers;Lateral/Scoot Transfers     Squat pivot transfers: Mod assist;+2 physical assistance    Lateral/Scoot Transfers: Max assist;+2 physical assistance General transfer comment: mod +2 squat pivot with right knee blocked and hooking therapist with LUE to pivot toward right however pt with weakness and not fully pivoting on transfer with mod +2 assist to scoot back in chair. Once in chair pt with bradycardia from 90 to  55bpm and decreased responsiveness with max +3 lateral transfer with drop arm to return to sitting EOb then supine  Ambulation/Gait             General Gait Details: unable at baseline   Stairs             Wheelchair Mobility    Modified Rankin (Stroke Patients Only)       Balance Overall balance assessment: Needs assistance   Sitting balance-Leahy Scale: Fair Sitting balance - Comments: EOb with and without UE support   Standing balance support: Bilateral upper extremity supported;During functional activity Standing balance-Leahy Scale: Poor Standing balance comment: relies on B UE and external support                            Cognition Arousal/Alertness: Awake/alert Behavior During Therapy: WFL for tasks assessed/performed Overall Cognitive Status: Within Functional Limits for tasks assessed                                        Exercises      General Comments        Pertinent Vitals/Pain Pain Score: 4  Pain Location: right knee Pain Descriptors / Indicators: Aching;Guarding Pain Intervention(s): Limited activity within patient's tolerance;Monitored during session;Repositioned    Home Living  Prior Function            PT Goals (current goals can now be found in the care plan section) Progress towards PT goals: Not progressing toward goals - comment    Frequency    Min 3X/week      PT Plan Discharge plan needs to be updated    Co-evaluation              AM-PAC PT "6 Clicks" Mobility   Outcome Measure  Help needed turning from your back to your side while in a flat bed without using bedrails?: A Little Help needed moving from lying on your back to sitting on the side of a flat bed without using bedrails?: A Little Help needed moving to and from a bed to a chair (including a wheelchair)?: Total Help needed standing up from a chair using your arms (e.g., wheelchair or  bedside chair)?: Total Help needed to walk in hospital room?: Total Help needed climbing 3-5 steps with a railing? : Total 6 Click Score: 10    End of Session Equipment Utilized During Treatment: Gait belt Activity Tolerance: Treatment limited secondary to medical complications (Comment) Patient left: in bed;with call bell/phone within reach;with bed alarm set;with nursing/sitter in room Nurse Communication: Mobility status;Precautions PT Visit Diagnosis: Other abnormalities of gait and mobility (R26.89);Muscle weakness (generalized) (M62.81)     Time: [id number] PT Time Calculation (min) (ACUTE ONLY): 22 min  Charges:  $Therapeutic Activity: 8-22 mins                     Vraj Denardo P, PT Acute Rehabilitation Services Pager: [phone] Office: [place] Paiten Boies 05/24/2021, 9:05 AM

## 2021-05-24 NOTE — Evaluation (Signed)
Clinical/Bedside Swallow Evaluation Patient Details  Name: [name] MRN: [id number] Date of Birth: 09-Apr-1935  Today's Date: 05/24/2021 Time: SLP Start Time (ACUTE ONLY): 0902 SLP Stop Time (ACUTE ONLY): [id number] SLP Time Calculation (min) (ACUTE ONLY): 23 min  Past Medical History:  Past Medical History:  Diagnosis Date   Anemia    Arthritis    Carotid artery disease (HCC)    CHF (congestive heart failure) (HCC)    CKD (chronic kidney disease), stage IV ([place])    Coronary artery disease 2010   Status post coronary artery bypass grafting   Diabetes mellitus    Type II   Dyspnea    Dysrhythmia    Foot ulcer ([place])    History of blood transfusion 05/2018   Hyperlipidemia    Hypertension    Hypothyroidism    Myocardial infarction ([place]) 2010   PAD (peripheral artery disease) (HCC)    Postoperative atrial fibrillation (HCC)    Thrombocytopenia ([place])    Past Surgical History:  Past Surgical History:  Procedure Laterality Date   AMPUTATION Left 06/16/2018   Procedure: LEFT TRANSMETATARSAL AMPUTATION;  Surgeon: [name], MD;  Location: [place];  Service: Orthopedics;  Laterality: Left;   AMPUTATION Left 07/16/2018   Procedure: LEFT BELOW KNEE AMPUTATION;  Surgeon: [name], MD;  Location: [place];  Service: Orthopedics;  Laterality: Left;   AORTOGRAM Left 06/03/2018   Procedure: ABDOMINAL AORTOGRAM WITH CARBON DIOXIDE LEFT LOWER EXTREMITY RUNOFF;  Surgeon: Conrad Fenton, MD;  Location: [place];  Service: Vascular;  Laterality: Left;   BELOW KNEE LEG AMPUTATION Left 07/16/2018   carotid insuff     CORONARY ARTERY BYPASS GRAFT  2010   LIMA to LAD, SVG to diagonal, SVG to circumflex, marginal, SVG to posterior descending branch.   FEMORAL-POPLITEAL BYPASS GRAFT Left 06/10/2018   Procedure: LEFT COMMON FEMORAL TO BELOW KNEE POPLITEAL BYPASS GRAFT WITH PROPATEN;  Surgeon: Conrad [place], MD;  Location: [place];  Service: Vascular;  Laterality: Left;   LEFT HEART CATH AND CORONARY  ANGIOGRAPHY N/A Jun 11, 2021   Procedure: LEFT HEART CATH AND CORONARY ANGIOGRAPHY;  Surgeon: Burnell Blanks, MD;  Location: [place] [facility];  Service: Cardiovascular;  Laterality: N/A;   HPI:  86 yo admitted 6/4 with SOB with respiratory failure and volume overload with CHF exacerbation. 06/12/2023 pt coded with bradycardia and PEA arrest with intubation 6/8-6/9. Arrest suspected to be precipitated by respiratory/possible aspiration event, as per MD note, "RT suctioned copius amounts of secretions after intubation and there are reports that his airways was congestion with material that required suctioning." PMhx: CAD s/p CABG, HTN, HLD, PAD, Lt BKA, CKD, DM   Assessment / Plan / Recommendation Clinical Impression  Pt does not show overt signs of dysphagia at bedside. Coughing observed prior to PO intake is weak/guarded though, likely secondary to pain, although it does also sound congested. Clinically he appears to be appropriate on current diet of regular solids and thin liquids. SLP did reach out to pulmonology team though, because if there is ongoing concern for potential aspiration that could have contributed to his cardiac arrest, further instrumental evaluation could be completed. Will f/u as indicated. SLP Visit Diagnosis: Dysphagia, unspecified (R13.10)    Aspiration Risk  Mild aspiration risk    Diet Recommendation Regular;Thin liquid   Liquid Administration via: Cup;Straw Medication Administration: Whole meds with liquid Supervision: Patient able to self feed;Intermittent supervision to cue for compensatory strategies Compensations: Slow rate;Small sips/bites Postural Changes: Seated  upright at 90 degrees;Remain upright for at least 30 minutes after po intake    Other  Recommendations Oral Care Recommendations: Oral care BID   Follow up Recommendations Inpatient Rehab      Frequency and Duration min 2x/week  1 week       Prognosis Prognosis for Safe Diet Advancement: Good       Swallow Study   General HPI: 86 yo admitted 6/4 with SOB with respiratory failure and volume overload with CHF exacerbation. 05-26-2023 pt coded with bradycardia and PEA arrest with intubation 6/8-6/9. Arrest suspected to be precipitated by respiratory/possible aspiration event, as per MD note, "RT suctioned copius amounts of secretions after intubation and there are reports that his airways was congestion with material that required suctioning." PMhx: CAD s/p CABG, HTN, HLD, PAD, Lt BKA, CKD, DM Type of Study: Bedside Swallow Evaluation Previous Swallow Assessment: none in chart Diet Prior to this Study: Regular;Thin liquids Temperature Spikes Noted: No Respiratory Status: Nasal cannula History of Recent Intubation: Yes Length of Intubations (days): 1 days Date extubated: 05/23/21 Behavior/Cognition: Alert;Cooperative;Pleasant mood;Other (Comment) (forgetful - doesn't remember eating breakfast this am) Oral Cavity Assessment: Within Functional Limits Oral Care Completed by SLP: No Oral Cavity - Dentition: Missing dentition Vision: Functional for self-feeding Self-Feeding Abilities: Able to feed self Patient Positioning: Upright in bed Baseline Vocal Quality: Normal Volitional Cough: Weak;Other (Comment) (guarded secondary to chest pain)    Oral/Motor/Sensory Function Overall Oral Motor/Sensory Function: Within functional limits   Ice Chips Ice chips: Within functional limits Presentation: Spoon   Thin Liquid Thin Liquid: Within functional limits Presentation: Cup;Self Fed;Straw    Nectar Thick Nectar Thick Liquid: Not tested   Honey Thick Honey Thick Liquid: Not tested   Puree Puree: Within functional limits Presentation: Self Fed;Spoon   Solid     Solid: Within functional limits Presentation: Self Fed      Osie Bond., M.A. [place] Pager [phone] Office [phone]  05/24/2021,9:34 AM

## 2021-05-24 NOTE — Progress Notes (Signed)
Progress Note  Patient Name: [name] Date of Encounter: 05/25/2021  [facility] Cardiologist: Sanda Klein, MD   Subjective   Feeling better. States breathing is improving. Remains volume overloaded on exam.  Blood pressures stable 120-130s Remains in [place] 3L Off pressors Cr 3.97 HgB 8.5-->8.7 Net negative 614m on lasix 857mIV daily  Inpatient Medications    Scheduled Meds:  aspirin  81 mg Oral Daily   atorvastatin  40 mg Oral QHS   chlorhexidine gluconate (MEDLINE KIT)  15 mL Mouth Rinse BID   Chlorhexidine Gluconate Cloth  6 each Topical Daily   darbepoetin (ARANESP) injection - NON-DIALYSIS  40 mcg Subcutaneous Q Fri-1800   docusate sodium  100 mg Oral BID   furosemide  80 mg Intravenous Daily   gabapentin  300 mg Oral Daily   gabapentin  600 mg Oral BID AC & HS   heparin injection (subcutaneous)  5,000 Units Subcutaneous Q8H   hydrALAZINE  25 mg Oral Q8H   insulin aspart  0-15 Units Subcutaneous Q4H   insulin aspart  4 Units Subcutaneous Q4H   insulin detemir  10 Units Subcutaneous BID   ipratropium-albuterol  3 mL Nebulization TID   isosorbide mononitrate  15 mg Oral Daily   levothyroxine  50 mcg Oral QAC breakfast   mouth rinse  15 mL Mouth Rinse BID   pantoprazole (PROTONIX) IV  40 mg Intravenous Daily   polyethylene glycol  17 g Oral Daily   sodium chloride flush  10-40 mL Intracatheter Q12H   sodium chloride flush  3 mL Intravenous Q12H   Continuous Infusions:  sodium chloride 10 mL/hr at 05/24/21 2300   sodium chloride     norepinephrine (LEVOPHED) Adult infusion Stopped (05/23/21 1320)   piperacillin-tazobactam (ZOSYN)  IV 2.25 g (05/25/21 0617)   PRN Meds: sodium chloride, acetaminophen, fentaNYL (SUBLIMAZE) injection, fentaNYL (SUBLIMAZE) injection, ipratropium-albuterol, midazolam, midazolam, naphazoline-pheniramine, nitroGLYCERIN, ondansetron (ZOFRAN) IV, oxyCODONE, sodium chloride flush, sodium chloride flush, traZODone   Vital  Signs    Vitals:   05/25/21 0510 05/25/21 0600 05/25/21 0635 05/25/21 0700  BP: (!) 110/56 127/62  (!) 128/53  Pulse: 76 79  77  Resp: '13 18  17  ' Temp:   98.2 F (36.8 C)   TempSrc:   Axillary   SpO2: 92% 96%  97%  Weight:      Height:        Intake/Output Summary (Last 24 hours) at 05/25/2021 0741 Last data filed at 05/25/2021 0600 Gross per 24 hour  Intake 1202.03 ml  Output 1830 ml  Net -627.97 ml   Last 3 Weights 05/25/2021 05/24/2021 05/23/2021  Weight (lbs) 190 lb 4.1 oz 191 lb 5.8 oz 192 lb 0.3 oz  Weight (kg) 86.3 kg 86.8 kg 87.1 kg      Telemetry    NSR, very frequent PVCs, brief runs of NSVT, trigeminy- Personally Reviewed  ECG    No new tracing today- Personally Reviewed  Physical Exam   VS:  BP (!) 128/53   Pulse 77   Temp 98.2 F (36.8 C) (Axillary)   Resp 17   Ht '5\' 4"'  (1.626 m)   Wt 86.3 kg   SpO2 97%   BMI 32.66 kg/m  , BMI Body mass index is 32.66 kg/m. GENERAL:  Chronically ill appearing, alert and awake. Interactive HEENT: Normal, dry oral mucosa NECK:  No carotid bruit LUNGS: +bilateral rhonchi and wheezes HEART:  Irregular.  PMI not displaced or sustained,S1 and S2 within normal  limits, no S3, no S4, no clicks, no rubs, no murmurs ABD:  Soft, ND, NTTP EXT:  L BKA.  Trace-1+ R LE edema, no cyanosis no clubbing SKIN:  No rashes no nodules NEURO:  AAOx3 PSYCH:  Normal affect  Labs    High Sensitivity Troponin:   Recent Labs  Lab 05/19/21 0610 05/22/21 0741 05/22/21 0926  TROPONINIHS 593* 155* 125*      Chemistry Recent Labs  Lab 05/22/21 0741 05/22/21 0912 05/23/21 0325 05/23/21 2253 05/24/21 0429 05/25/21 0413  NA 132*   < > 133* 135 134* 134*  K 4.4   < > 3.5 4.7 4.7 4.7  CL 96*  --  98 98 100 96*  CO2 21*  --  '22 22 23 24  ' GLUCOSE 332*  --  241* 168* 115* 113*  BUN 88*  --  83* 81* 82* 85*  CREATININE 3.96*  --  3.45* 3.64* 3.75* 3.97*  CALCIUM 8.4*  --  8.0* 8.3* 8.6* 8.7*  PROT 6.6  --   --   --   --   --    ALBUMIN 3.2*  --  2.7*  --  2.9* 3.0*  AST 50*  --   --   --   --   --   ALT 42  --   --   --   --   --   ALKPHOS 71  --   --   --   --   --   BILITOT 0.7  --   --   --   --   --   GFRNONAA 14*  --  17* 16* 15* 14*  ANIONGAP 15  --  '13 15 11 14   ' < > = values in this interval not displayed.     Hematology Recent Labs  Lab 05/23/21 0325 05/24/21 0429 05/25/21 0413  WBC 6.9 7.6 8.1  RBC 2.75* 2.77* 2.69*  HGB 8.7* 8.7* 8.5*  HCT 26.9* 28.4* 27.5*  MCV 97.8 102.5* 102.2*  MCH 31.6 31.4 31.6  MCHC 32.3 30.6 30.9  RDW 13.2 13.2 13.2  PLT 143* 136* 133*    BNP Recent Labs  Lab 05/19/21 1422  BNP 799.7*     DDimer  Recent Labs  Lab 05/20/21 1015  DDIMER 1.82*     Radiology    DG CHEST PORT 1 VIEW  Result Date: 05/23/2021 CLINICAL DATA:  Endotracheal tube placement. EXAM: PORTABLE CHEST 1 VIEW COMPARISON:  May 22, 2021. FINDINGS: Stable cardiomediastinal silhouette. Status post coronary bypass graft. Endotracheal and nasogastric tubes are in grossly good position. Right-sided PICC line is noted with distal tip in expected position of right atrium. No pneumothorax is noted. Decreased bilateral lung opacities are noted suggesting improving infiltrates or edema. Bony thorax is unremarkable. IMPRESSION: Endotracheal and nasogastric tubes are in grossly good position. Right-sided PICC line is noted with distal tip in expected position of right atrium. Improving bilateral lung opacities as described above. Electronically Signed   By: Marijo Conception M.D.   On: 05/23/2021 10:35   DG Swallowing Func-Speech Pathology  Result Date: 05/24/2021 Formatting of this result is different from the original. Objective Swallowing Evaluation: Type of Study: MBS-Modified Barium Swallow Study  Patient Details Name: [name] MRN: [id number] Date of Birth: 1934/12/22 Today's Date: 05/24/2021 Time: SLP Start Time (ACUTE ONLY): 6803 -SLP Stop Time (ACUTE ONLY): 1227 SLP Time Calculation (min)  (ACUTE ONLY): 12 min Past Medical History: Past Medical History: Diagnosis Date  Anemia  Arthritis   Carotid artery disease (HCC)   CHF (congestive heart failure) (HCC)   CKD (chronic kidney disease), stage IV ([place])   Coronary artery disease 2010  Status post coronary artery bypass grafting  Diabetes mellitus   Type II  Dyspnea   Dysrhythmia   Foot ulcer ([place])   History of blood transfusion 05/2018  Hyperlipidemia   Hypertension   Hypothyroidism   Myocardial infarction [facility]) 2010  PAD (peripheral artery disease) (HCC)   Postoperative atrial fibrillation (HCC)   Thrombocytopenia ([place])  Past Surgical History: Past Surgical History: Procedure Laterality Date  AMPUTATION Left 06/16/2018  Procedure: LEFT TRANSMETATARSAL AMPUTATION;  Surgeon: [name], MD;  Location: [place];  Service: Orthopedics;  Laterality: Left;  AMPUTATION Left 07/16/2018  Procedure: LEFT BELOW KNEE AMPUTATION;  Surgeon: [name], MD;  Location: [place];  Service: Orthopedics;  Laterality: Left;  AORTOGRAM Left 06/03/2018  Procedure: ABDOMINAL AORTOGRAM WITH CARBON DIOXIDE LEFT LOWER EXTREMITY RUNOFF;  Surgeon: Conrad McDowell, MD;  Location: [place];  Service: Vascular;  Laterality: Left;  BELOW KNEE LEG AMPUTATION Left 07/16/2018  carotid insuff    CORONARY ARTERY BYPASS GRAFT  2010  LIMA to LAD, SVG to diagonal, SVG to circumflex, marginal, SVG to posterior descending branch.  FEMORAL-POPLITEAL BYPASS GRAFT Left 06/10/2018  Procedure: LEFT COMMON FEMORAL TO BELOW KNEE POPLITEAL BYPASS GRAFT WITH PROPATEN;  Surgeon: Conrad [place], MD;  Location: [place];  Service: Vascular;  Laterality: Left;  LEFT HEART CATH AND CORONARY ANGIOGRAPHY N/A 06/16/21  Procedure: LEFT HEART CATH AND CORONARY ANGIOGRAPHY;  Surgeon: Burnell Blanks, MD;  Location: [place] [facility];  Service: Cardiovascular;  Laterality: N/A; HPI: 86 yo admitted 6/4 with SOB with respiratory failure and volume overload with CHF exacerbation. 06-17-2023 pt coded with bradycardia and PEA  arrest with intubation 6/8-6/9. Arrest suspected to be precipitated by respiratory/possible aspiration event, as per MD note, "RT suctioned copius amounts of secretions after intubation and there are reports that his airways was congestion with material that required suctioning." PMhx: CAD s/p CABG, HTN, HLD, PAD, Lt BKA, CKD, DM  Subjective: denies trouble swallowing Assessment / Plan / Recommendation CHL IP CLINICAL IMPRESSIONS 05/24/2021 Clinical Impression Pt's oropharyngeal swallowing is grossly WFL, with a single instance of aspiration that occurred before the swallow due to impaired timing as the pt drank thin liquids via concsecutive straw sips. This did trigger an immediate cough response, which is how his sensory system should react to try to protect his airway, although his cough was not strong enough to eject the aspirates from his airway. No further aspiration occurred despite additional challenging including more sequential boluses, cup vs straw delivery methods, and mixed consistencies. Recommend continuing with regular solids and thin liquids with use of aspiration precautions. Would monitor closely for any additional coughing during PO intake, and SLP will f/u briefly as well. SLP Visit Diagnosis Dysphagia, unspecified (R13.10) Attention and concentration deficit following -- Frontal lobe and executive function deficit following -- Impact on safety and function Mild aspiration risk   CHL IP TREATMENT RECOMMENDATION 05/24/2021 Treatment Recommendations Therapy as outlined in treatment plan below   Prognosis 05/24/2021 Prognosis for Safe Diet Advancement Good Barriers to Reach Goals -- Barriers/Prognosis Comment -- CHL IP DIET RECOMMENDATION 05/24/2021 SLP Diet Recommendations Regular solids;Thin liquid Liquid Administration via Cup;Straw Medication Administration Whole meds with liquid Compensations Slow rate;Small sips/bites Postural Changes Seated upright at 90 degrees   CHL IP OTHER RECOMMENDATIONS  05/24/2021 Recommended Consults -- Oral Care  Recommendations Oral care BID Other Recommendations --   CHL IP FOLLOW UP RECOMMENDATIONS 05/24/2021 Follow up Recommendations Inpatient Rehab   CHL IP FREQUENCY AND DURATION 05/24/2021 Speech Therapy Frequency (ACUTE ONLY) min 2x/week Treatment Duration 1 week      CHL IP ORAL PHASE 05/24/2021 Oral Phase WFL Oral - Pudding Teaspoon -- Oral - Pudding Cup -- Oral - Honey Teaspoon -- Oral - Honey Cup -- Oral - Nectar Teaspoon -- Oral - Nectar Cup -- Oral - Nectar Straw -- Oral - Thin Teaspoon -- Oral - Thin Cup -- Oral - Thin Straw -- Oral - Puree -- Oral - Mech Soft -- Oral - Regular -- Oral - Multi-Consistency -- Oral - Pill -- Oral Phase - Comment --  CHL IP PHARYNGEAL PHASE 05/24/2021 Pharyngeal Phase Impaired Pharyngeal- Pudding Teaspoon -- Pharyngeal -- Pharyngeal- Pudding Cup -- Pharyngeal -- Pharyngeal- Honey Teaspoon -- Pharyngeal -- Pharyngeal- Honey Cup -- Pharyngeal -- Pharyngeal- Nectar Teaspoon -- Pharyngeal -- Pharyngeal- Nectar Cup -- Pharyngeal -- Pharyngeal- Nectar Straw -- Pharyngeal -- Pharyngeal- Thin Teaspoon -- Pharyngeal -- Pharyngeal- Thin Cup WFL Pharyngeal -- Pharyngeal- Thin Straw Penetration/Aspiration before swallow Pharyngeal Material enters airway, passes BELOW cords and not ejected out despite cough attempt by patient Pharyngeal- Puree WFL Pharyngeal -- Pharyngeal- Mechanical Soft -- Pharyngeal -- Pharyngeal- Regular WFL Pharyngeal -- Pharyngeal- Multi-consistency -- Pharyngeal -- Pharyngeal- Pill WFL Pharyngeal -- Pharyngeal Comment --  CHL IP CERVICAL ESOPHAGEAL PHASE 05/24/2021 Cervical Esophageal Phase WFL Pudding Teaspoon -- Pudding Cup -- Honey Teaspoon -- Honey Cup -- Nectar Teaspoon -- Nectar Cup -- Nectar Straw -- Thin Teaspoon -- Thin Cup -- Thin Straw -- Puree -- Mechanical Soft -- Regular -- Multi-consistency -- Pill -- Cervical Esophageal Comment -- Osie Bond., M.A. [place] [facility] Pager [phone] Office  [phone] 05/24/2021, 1:36 PM                Cardiac Studies  ECHO 05/22/21: IMPRESSIONS   1. Left ventricular ejection fraction, by estimation, is 30 to 35%. The  left ventricle has moderately decreased function. The left ventricle  demonstrates global hypokinesis. There is mild left ventricular  hypertrophy. Left ventricular diastolic  parameters are consistent with Grade I diastolic dysfunction (impaired  relaxation). There is the interventricular septum is flattened in systole  and diastole, consistent with right ventricular pressure and volume  overload.   2. Right ventricular systolic function is normal. The right ventricular  size is mildly enlarged. There is moderately elevated pulmonary artery  systolic pressure. The estimated right ventricular systolic pressure is  38.7 mmHg.   3. Left atrial size was mild to moderately dilated.   4. Right atrial size was mildly dilated.   5. The mitral valve is normal in structure. Mild mitral valve  regurgitation. No evidence of mitral stenosis. Severe mitral annular  calcification.   6. Tricuspid valve regurgitation is moderate.   7. The aortic valve is normal in structure. There is mild calcification  of the aortic valve. There is mild thickening of the aortic valve. Aortic  valve regurgitation is not visualized. Mild aortic valve sclerosis is  present, with no evidence of aortic  valve stenosis.   8. The inferior vena cava is dilated in size with <50% respiratory  variability, suggesting right atrial pressure of 15 mmHg.   ECHO 05/19/2021   1. Left ventricular ejection fraction, by estimation, is 50 to 55%. The  left ventricle has low normal function. The left ventricle demonstrates  regional wall motion abnormalities (  see scoring diagram/findings for  description). There is mild left  ventricular hypertrophy. Left ventricular diastolic parameters are  indeterminate.   2. Right ventricular systolic function is moderately  reduced. The right  ventricular size is moderately enlarged. There is severely elevated  pulmonary artery systolic pressure. The estimated right ventricular  systolic pressure is 27.2 mmHg.   3. Left atrial size was mildly dilated.   4. The mitral valve is abnormal. Trivial mitral valve regurgitation.  Severe mitral annular calcification.   5. The aortic valve is tricuspid. There is mild calcification of the  aortic valve. Aortic valve regurgitation is not visualized. Mild to  moderate aortic valve sclerosis/calcification is present, without any  evidence of aortic stenosis. Aortic valve mean   gradient measures 5.0 mmHg.   6. The inferior vena cava is normal in size with <50% respiratory  variability, suggesting right atrial pressure of 8 mmHg.    Echocardiogram: 05/2018 Study Conclusions   - Left ventricle: The cavity size was normal. Wall thickness was    increased in a pattern of mild LVH. Systolic function was normal.    The estimated ejection fraction was in the range of 55% to 60%.    Wall motion was normal; there were no regional wall motion    abnormalities. Features are consistent with a pseudonormal left    ventricular filling pattern, with concomitant abnormal relaxation    and increased filling pressure (grade 2 diastolic dysfunction).  - Aortic valve: Mildly calcified annulus. Mildly thickened, mildly    calcified leaflets.  - Mitral valve: Mildly calcified annulus. Moderately thickened,    moderately calcified leaflets .     Patient Profile     86 y.o. male with CAD status post CABG, PAD status post left-pop bypass 05/2017 and left BKA, hypertension, hyperlipidemia, diabetes, carotid stenosis, and stage IV CKD admitted with hypoxic respiratory failure, volume overload, and elevated troponin.  Admitted to [facility] after a cardiac arrest on 6/8 precipitated by bradycardia. Now improving and extubated.   Assessment & Plan    # Brady-arrest: Patient became bradycardic to the  20s and lost his pulse.  He was on metoprolol 12.65m bid at the time. He had epinephrine and brief CPR with ROSC.  He was evaluated by EP and not thought to be a candidate for PPM.  They felt it was PEA and not primarily bradycardia. He underwent left heart cath which showed 3 out of 4 of his grafts were patent and it was not thought to be primarily an ischemic event.  HS troponin was downtrending.   He has a long first degree AV block and very frequent ventricular ectopy.  Would continue to avoid nodal agents for now and continue to monitor closely on telemetry. He was evaluated with bedside swallow and there was no frank aspiration.   -Hold nodal agents and monitor -Per EP, not a candidate for PPM  # Acute systolic and diastolic heart failure:  # Pulmonary hypertension:  Left ventricular systolic function was relatively preserved on echo 05/19/2021.  On repeat 05/22/2021 LVEF was reduced to 30 to 35%.  Right atrial pressure was 15 mmHg.  Co-ox was low at 59.  Initially on levophed now weaned off. No beta blocker as above.  No ACE-I/ARB given his renal dysfunction.  No targets for PCI on cath 05/22/21 and 3/4 grafts were patent. -Diuretics per nephrology -No BB due to brady arrest as above -No ACE/ARB/ARNI due to renal dysfunction -Continue hydralazine 269mq8h; up-titrate as tolerated -Continue  imdur 75m daily; up-titrate as tolerated -Monitor I/Os and daily weights  # Acute on chronic renal failure:  Appreciate nephrology guiding his diuresis.  His renal function has improved with diuretics.   -Diuresis per renal  # CAD s/p CABG:  # Elevated troponin: # Hyperlipidemia:  CABG in 2010 with LIMA-LAD, SVG-D1, SVG-LCx and SVG-PDA.  High-sensitivity troponin was elevated to 937 at outside hospital.  Now downtrending.  Left heart cath revealed severe native vessel disease in the LAD and OM 2.  3 out of 4 bypass grafts were patent including the LIMA to the LAD.  There were no targets for PCI.  -Continue  ASA 821mdaily -Continue lipitor 4047maily -No BB/ACE/ARB as above  # PVCs:  Frequent PVCs on telemetry with ventricular bigeminy and NSVT.   Hold metoprolol 2/2 bradycardia. -Off BB due to brady arrest as above  # PAD: S/p L BKA.  Wheelchair bound. -Continue ASA 23m63mily -Continue lipitor 40mg73mly  # Bladder obstruction: Foley placed 6/7. -Management per primary  CRITICAL CARE TIME: I have spent a total of 40 minutes with patient reviewing hospital notes, telemetry, EKGs, labs and examining the patient as well as establishing an assessment and plan that was discussed with the patient.  > 50% of time was spent in direct patient care. The patient is critically ill with multi-organ system failure and requires high complexity decision making for assessment and support, frequent evaluation and titration of therapies, application of advanced monitoring technologies and extensive interpretation of multiple databases.    For questions or updates, please contact [facility] consult [email or web address] for contact info under        Signed, HeathFreada Bergeron 05/25/2021, 7:41 AM

## 2021-05-24 NOTE — Progress Notes (Signed)
PHARMACY NOTE:  ANTIMICROBIAL RENAL DOSAGE ADJUSTMENT  Current antimicrobial regimen includes a mismatch between antimicrobial dosage and estimated renal function.  As per policy approved by the Pharmacy & Therapeutics and Medical Executive Committees, the antimicrobial dosage will be adjusted accordingly.  Current antimicrobial dosage:  Zosyn 2.25g IV q 6 hrs  Indication: aspiration PNA  Renal Function:  Estimated Creatinine Clearance: 14.6 mL/min (A) (by C-G formula based on SCr of 3.75 mg/dL (H)). '[]'$      On intermittent HD, scheduled: '[]'$      On CRRT    Antimicrobial dosage has been changed to:  Zosyn 2.25 g IV q 8 hrs  Additional comments:   Thank you for allowing pharmacy to be a part of this patient's care.  [place] Crane, Roylene Reason, BCCP Clinical Pharmacist  05/24/2021 3:08 PM   [facility] pharmacy phone numbers are listed on [email or web address]

## 2021-05-25 DIAGNOSIS — I5043 Acute on chronic combined systolic (congestive) and diastolic (congestive) heart failure: Secondary | ICD-10-CM

## 2021-05-25 LAB — RENAL FUNCTION PANEL
Albumin: 3 g/dL — ABNORMAL LOW (ref 3.5–5.0)
Anion gap: 14 (ref 5–15)
BUN: 85 mg/dL — ABNORMAL HIGH (ref 8–23)
CO2: 24 mmol/L (ref 22–32)
Calcium: 8.7 mg/dL — ABNORMAL LOW (ref 8.9–10.3)
Chloride: 96 mmol/L — ABNORMAL LOW (ref 98–111)
Creatinine, Ser: 3.97 mg/dL — ABNORMAL HIGH (ref 0.61–1.24)
GFR, Estimated: 14 mL/min — ABNORMAL LOW (ref >60)
Glucose, Bld: 113 mg/dL — ABNORMAL HIGH (ref 70–99)
Phosphorus: 7.1 mg/dL — ABNORMAL HIGH (ref 2.5–4.6)
Potassium: 4.7 mmol/L (ref 3.5–5.1)
Sodium: 134 mmol/L — ABNORMAL LOW (ref 135–145)

## 2021-05-25 LAB — GLUCOSE, CAPILLARY
Glucose-Capillary: 107 mg/dL — ABNORMAL HIGH (ref 70–99)
Glucose-Capillary: 127 mg/dL — ABNORMAL HIGH (ref 70–99)
Glucose-Capillary: 151 mg/dL — ABNORMAL HIGH (ref 70–99)
Glucose-Capillary: 141 mg/dL — ABNORMAL HIGH (ref 70–99)

## 2021-05-25 LAB — CBC
HCT: 27.5 % — ABNORMAL LOW (ref 39.0–52.0)
Hemoglobin: 8.5 g/dL — ABNORMAL LOW (ref 13.0–17.0)
MCH: 31.6 pg (ref 26.0–34.0)
MCHC: 30.9 g/dL (ref 30.0–36.0)
MCV: 102.2 fL — ABNORMAL HIGH (ref 80.0–100.0)
Platelets: 133 10*3/uL — ABNORMAL LOW (ref 150–400)
RBC: 2.69 MIL/uL — ABNORMAL LOW (ref 4.22–5.81)
RDW: 13.2 % (ref 11.5–15.5)
WBC: 8.1 10*3/uL (ref 4.0–10.5)
nRBC: 0 % (ref 0.0–0.2)

## 2021-05-25 LAB — MAGNESIUM: Magnesium: 2.2 mg/dL (ref 1.7–2.4)

## 2021-05-25 LAB — CULTURE, RESPIRATORY W GRAM STAIN: Culture: NORMAL

## 2021-05-25 LAB — IRON AND TIBC
Iron: 17 ug/dL — ABNORMAL LOW (ref 45–182)
Saturation Ratios: 8 % — ABNORMAL LOW (ref 17.9–39.5)
TIBC: 224 ug/dL — ABNORMAL LOW (ref 250–450)
UIBC: 207 ug/dL

## 2021-05-25 LAB — FERRITIN: Ferritin: 137 ng/mL (ref 24–336)

## 2021-05-25 MED ORDER — CHLORHEXIDINE GLUCONATE 0.12 % MT SOLN
OROMUCOSAL | Status: AC
  Filled 2021-05-25: qty 15

## 2021-05-25 NOTE — Progress Notes (Signed)
Patient ID: [name], male   DOB: Mar 11, 1935, 86 y.o.   MRN: [id number] S: Confused this morning and per nursing started last night. O:BP 127/60   Pulse 79   Temp 98.2 F (36.8 C) (Axillary)   Resp 13   Ht _0  (1.626 m)   Wt 86.3 kg   SpO2 97%   BMI 32.66 kg/m   Intake/Output Summary (Last 24 hours) at 05/25/2021 0914 Last data filed at 05/25/2021 0800 Gross per 24 hour  Intake 1142.12 ml  Output 1905 ml  Net -762.88 ml   Intake/Output: I/O last 3 completed shifts: In: 1922.2 [P.O.:1420; I.V.:302.2; IV Piggyback:200] Out: 2335 [Urine:2335]  Intake/Output this shift:  Total I/O In: 320.1 [P.O.:240; I.V.:30.1; IV Piggyback:50] Out: 75 [Urine:75] Weight change: -0.5 kg Gen:NAD CVS: RRR Resp:occ rhonchi bilaterally Abd:+BS, soft, NT/ND YIF:OYDXA to 1+ RLE edema and presacral, s/p LBKA  Recent Labs  Lab 05/21/21 0102 05/22/21 0026 05/22/21 0741 05/22/21 0912 05/23/21 0325 05/23/21 2253 05/24/21 0429 05/25/21 0413  NA 131* 132* 132* 133* 133* 135 134* 134*  K 4.7 4.8 4.4 4.2 3.5 4.7 4.7 4.7  CL 97* 97* 96*  --  98 98 100 96*  CO2 23 21* 21*  --  _1 GLUCOSE 344* 226* 332*  --  241* 168* 115* 113*  BUN 80* 85* 88*  --  83* 81* 82* 85*  CREATININE 4.14* 3.81* 3.96*  --  3.45* 3.64* 3.75* 3.97*  ALBUMIN 2.9* 3.2* 3.2*  --  2.7*  --  2.9* 3.0*  CALCIUM 7.9* 8.2* 8.4*  --  8.0* 8.3* 8.6* 8.7*  PHOS 6.7* 6.7*  --   --  3.1  --  6.1* 7.1*  AST  --   --  50*  --   --   --   --   --   ALT  --   --  42  --   --   --   --   --    Liver Function Tests: Recent Labs  Lab 05/22/21 0741 05/23/21 0325 05/24/21 0429 05/25/21 0413  AST 50*  --   --   --   ALT 42  --   --   --   ALKPHOS 71  --   --   --   BILITOT 0.7  --   --   --   PROT 6.6  --   --   --   ALBUMIN 3.2* 2.7* 2.9* 3.0*   No results for input(s): LIPASE, AMYLASE in the last 168 hours. No results for input(s): AMMONIA in the last 168 hours. CBC: Recent Labs  Lab 05/22/21 0103  05/22/21 0741 05/22/21 0912 05/23/21 0325 05/24/21 0429 05/25/21 0413  WBC 6.2 13.9*  --  6.9 7.6 8.1  HGB 9.8* 10.8*   < > 8.7* 8.7* 8.5*  HCT 31.8* 34.7*   < > 26.9* 28.4* 27.5*  MCV 101.6* 101.8*  --  97.8 102.5* 102.2*  PLT 158 182  --  143* 136* 133*   < > = values in this interval not displayed.   Cardiac Enzymes: No results for input(s): CKTOTAL, CKMB, CKMBINDEX, TROPONINI in the last 168 hours. CBG: Recent Labs  Lab 05/24/21 1517 05/24/21 1952 05/24/21 2348 05/25/21 0419 05/25/21 0635  GLUCAP 166* 129* 117* 107* 127*    Iron Studies:  Recent Labs    05/25/21 0413  IRON 17*  TIBC 224*  FERRITIN 137   Studies/Results: DG Swallowing Func-Speech Pathology  Result  Date: 05/24/2021 Formatting of this result is different from the original. Objective Swallowing Evaluation: Type of Study: MBS-Modified Barium Swallow Study  Patient Details Name: [name] MRN: [id number] Date of Birth: 03-Sep-1935 Today's Date: 05/24/2021 Time: SLP Start Time (ACUTE ONLY): 1215 -SLP Stop Time (ACUTE ONLY): 8032 SLP Time Calculation (min) (ACUTE ONLY): 12 min Past Medical History: Past Medical History: Diagnosis Date  Anemia   Arthritis   Carotid artery disease (HCC)   CHF (congestive heart failure) (HCC)   CKD (chronic kidney disease), stage IV ([place])   Coronary artery disease 2010  Status post coronary artery bypass grafting  Diabetes mellitus   Type II  Dyspnea   Dysrhythmia   Foot ulcer ([place])   History of blood transfusion 05/2018  Hyperlipidemia   Hypertension   Hypothyroidism   Myocardial infarction ([place]) 2010  PAD (peripheral artery disease) (HCC)   Postoperative atrial fibrillation ([place])   Thrombocytopenia ([place])  Past Surgical History: Past Surgical History: Procedure Laterality Date  AMPUTATION Left 06/16/2018  Procedure: LEFT TRANSMETATARSAL AMPUTATION;  Surgeon: [name], MD;  Location: [facility];  Service: Orthopedics;  Laterality: Left;  AMPUTATION Left 07/16/2018  Procedure: LEFT BELOW  KNEE AMPUTATION;  Surgeon: [name], MD;  Location: [facility];  Service: Orthopedics;  Laterality: Left;  AORTOGRAM Left 06/03/2018  Procedure: ABDOMINAL AORTOGRAM WITH CARBON DIOXIDE LEFT LOWER EXTREMITY RUNOFF;  Surgeon: Conrad Marion, MD;  Location: [facility];  Service: Vascular;  Laterality: Left;  BELOW KNEE LEG AMPUTATION Left 07/16/2018  carotid insuff    CORONARY ARTERY BYPASS GRAFT  2010  LIMA to LAD, SVG to diagonal, SVG to circumflex, marginal, SVG to posterior descending branch.  FEMORAL-POPLITEAL BYPASS GRAFT Left 06/10/2018  Procedure: LEFT COMMON FEMORAL TO BELOW KNEE POPLITEAL BYPASS GRAFT WITH PROPATEN;  Surgeon: Conrad [place], MD;  Location: [facility];  Service: Vascular;  Laterality: Left;  LEFT HEART CATH AND CORONARY ANGIOGRAPHY N/A June 15, 2021  Procedure: LEFT HEART CATH AND CORONARY ANGIOGRAPHY;  Surgeon: Burnell Blanks, MD;  Location: [facility];  Service: Cardiovascular;  Laterality: N/A; HPI: 86 yo admitted 6/4 with SOB with respiratory failure and volume overload with CHF exacerbation. 06/16/23 pt coded with bradycardia and PEA arrest with intubation 6/8-6/9. Arrest suspected to be precipitated by respiratory/possible aspiration event, as per MD note, "RT suctioned copius amounts of secretions after intubation and there are reports that his airways was congestion with material that required suctioning." PMhx: CAD s/p CABG, HTN, HLD, PAD, Lt BKA, CKD, DM  Subjective: denies trouble swallowing Assessment / Plan / Recommendation CHL IP CLINICAL IMPRESSIONS 05/24/2021 Clinical Impression Pt's oropharyngeal swallowing is grossly WFL, with a single instance of aspiration that occurred before the swallow due to impaired timing as the pt drank thin liquids via concsecutive straw sips. This did trigger an immediate cough response, which is how his sensory system should react to try to protect his airway, although his cough was not strong enough to eject the aspirates from his airway. No further  aspiration occurred despite additional challenging including more sequential boluses, cup vs straw delivery methods, and mixed consistencies. Recommend continuing with regular solids and thin liquids with use of aspiration precautions. Would monitor closely for any additional coughing during PO intake, and SLP will f/u briefly as well. SLP Visit Diagnosis Dysphagia, unspecified (R13.10) Attention and concentration deficit following -- Frontal lobe and executive function deficit following -- Impact on safety and function Mild aspiration risk   CHL IP TREATMENT RECOMMENDATION 05/24/2021 Treatment  Recommendations Therapy as outlined in treatment plan below   Prognosis 05/24/2021 Prognosis for Safe Diet Advancement Good Barriers to Reach Goals -- Barriers/Prognosis Comment -- CHL IP DIET RECOMMENDATION 05/24/2021 SLP Diet Recommendations Regular solids;Thin liquid Liquid Administration via Cup;Straw Medication Administration Whole meds with liquid Compensations Slow rate;Small sips/bites Postural Changes Seated upright at 90 degrees   CHL IP OTHER RECOMMENDATIONS 05/24/2021 Recommended Consults -- Oral Care Recommendations Oral care BID Other Recommendations --   CHL IP FOLLOW UP RECOMMENDATIONS 05/24/2021 Follow up Recommendations Inpatient Rehab   CHL IP FREQUENCY AND DURATION 05/24/2021 Speech Therapy Frequency (ACUTE ONLY) min 2x/week Treatment Duration 1 week      CHL IP ORAL PHASE 05/24/2021 Oral Phase WFL Oral - Pudding Teaspoon -- Oral - Pudding Cup -- Oral - Honey Teaspoon -- Oral - Honey Cup -- Oral - Nectar Teaspoon -- Oral - Nectar Cup -- Oral - Nectar Straw -- Oral - Thin Teaspoon -- Oral - Thin Cup -- Oral - Thin Straw -- Oral - Puree -- Oral - Mech Soft -- Oral - Regular -- Oral - Multi-Consistency -- Oral - Pill -- Oral Phase - Comment --  CHL IP PHARYNGEAL PHASE 05/24/2021 Pharyngeal Phase Impaired Pharyngeal- Pudding Teaspoon -- Pharyngeal -- Pharyngeal- Pudding Cup -- Pharyngeal -- Pharyngeal- Honey Teaspoon  -- Pharyngeal -- Pharyngeal- Honey Cup -- Pharyngeal -- Pharyngeal- Nectar Teaspoon -- Pharyngeal -- Pharyngeal- Nectar Cup -- Pharyngeal -- Pharyngeal- Nectar Straw -- Pharyngeal -- Pharyngeal- Thin Teaspoon -- Pharyngeal -- Pharyngeal- Thin Cup WFL Pharyngeal -- Pharyngeal- Thin Straw Penetration/Aspiration before swallow Pharyngeal Material enters airway, passes BELOW cords and not ejected out despite cough attempt by patient Pharyngeal- Puree WFL Pharyngeal -- Pharyngeal- Mechanical Soft -- Pharyngeal -- Pharyngeal- Regular WFL Pharyngeal -- Pharyngeal- Multi-consistency -- Pharyngeal -- Pharyngeal- Pill WFL Pharyngeal -- Pharyngeal Comment --  CHL IP CERVICAL ESOPHAGEAL PHASE 05/24/2021 Cervical Esophageal Phase WFL Pudding Teaspoon -- Pudding Cup -- Honey Teaspoon -- Honey Cup -- Nectar Teaspoon -- Nectar Cup -- Nectar Straw -- Thin Teaspoon -- Thin Cup -- Thin Straw -- Puree -- Mechanical Soft -- Regular -- Multi-consistency -- Pill -- Cervical Esophageal Comment -- Osie Bond., M.A. CCC-SLP Acute Rehabilitation Services Pager [phone] Office [phone] 05/24/2021, 1:36 PM               chlorhexidine       aspirin  81 mg Oral Daily   atorvastatin  40 mg Oral QHS   chlorhexidine gluconate (MEDLINE KIT)  15 mL Mouth Rinse BID   Chlorhexidine Gluconate Cloth  6 each Topical Daily   darbepoetin (ARANESP) injection - NON-DIALYSIS  40 mcg Subcutaneous Q Fri-1800   docusate sodium  100 mg Oral BID   furosemide  80 mg Intravenous Daily   gabapentin  300 mg Oral Daily   gabapentin  600 mg Oral BID AC & HS   heparin injection (subcutaneous)  5,000 Units Subcutaneous Q8H   hydrALAZINE  25 mg Oral Q8H   insulin aspart  0-15 Units Subcutaneous Q4H   insulin aspart  4 Units Subcutaneous Q4H   insulin detemir  10 Units Subcutaneous BID   ipratropium-albuterol  3 mL Nebulization TID   isosorbide mononitrate  15 mg Oral Daily   levothyroxine  50 mcg Oral QAC breakfast   mouth rinse  15 mL Mouth Rinse BID    pantoprazole (PROTONIX) IV  40 mg Intravenous Daily   polyethylene glycol  17 g Oral Daily   sodium chloride flush  10-40 mL Intracatheter Q12H  sodium chloride flush  3 mL Intravenous Q12H    BMET    Component Value Date/Time   NA 134 (L) 05/25/2021 0413   K 4.7 05/25/2021 0413   CL 96 (L) 05/25/2021 0413   CO2 24 05/25/2021 0413   GLUCOSE 113 (H) 05/25/2021 0413   BUN 85 (H) 05/25/2021 0413   CREATININE 3.97 (H) 05/25/2021 0413   CALCIUM 8.7 (L) 05/25/2021 0413   GFRNONAA 14 (L) 05/25/2021 0413   GFRAA 21 (L) 11/27/2019 0633   CBC    Component Value Date/Time   WBC 8.1 05/25/2021 0413   RBC 2.69 (L) 05/25/2021 0413   HGB 8.5 (L) 05/25/2021 0413   HCT 27.5 (L) 05/25/2021 0413   PLT 133 (L) 05/25/2021 0413   MCV 102.2 (H) 05/25/2021 0413   MCH 31.6 05/25/2021 0413   MCHC 30.9 05/25/2021 0413   RDW 13.2 05/25/2021 0413   LYMPHSABS 1.5 11/25/2019 1731   MONOABS 0.5 11/25/2019 1731   EOSABS 0.4 11/25/2019 1731   BASOSABS 0.0 11/25/2019 1731      Assessment/Plan:   Bradycardic arrest - not due to electrolyte abnormalities and likely primary cardiac in nature.  Discussed case with Dr. Oval Linsey and agree with [facility].  Would cont to hold diuretics pre-cath and limit contrast.  Pt is a poor long-term dialysis candidate and he and his daughter are in agreement for short term trial of HD if needed post cath. LHC revealed 3/4 patent bypass grafts, no targets for intervention EP evaluated and no pacemaker needed AKI/CKD stage IV - in setting of acute on chronic diastolic CHF and escalating diuretics consistent with cardiorenal syndrome.  His Scr increased from 3.89 to 4.18 after UOP of 3.9 liters with lasix 120 mg IV tid.  It was decreased to bid on 05/20/21 but still with 3.5 liters of UOP. Lasix held due to [facility] on 05/22/21 but good UOP overnight and improved BUN/Cr initially but now slowly rising. BUN/Creat up slightly since restarting Lasix 80 mg IV and cardiac cath. He is not a  suitable candidate for long-term dialysis given his poor functional status and multiple comorbidities.  Per Dr. Jonnie Finner, he and his daughter are not in favor of dialysis and will continue to treat conservatively. Acute on chronic diastolic CHF - responded to IV lasix 120 mg tid with 3.9 liters but rise in BUN/Cr.  Will decrease frequency to daily and follow.  He was on lasix 40 mg daily but may require switch to torsemide at time of discharge.  He is not sure if he was taking the furosemide regularly "I've been having trouble remembering things".  Has diuresed over 6 liters since admission. lasix 80 mg IV daily and follow.   Hopefully can transition to po soon. Anemia of CKD stage IV - follow H/H, iron stores.  will start ESA HTN - stable IDDM - per primary AMS - pt very confused this am.  Unclear if this is delirium or if he has some underlying dementia.  Doubt uremia as his BUN/Cr have been higher earlier in his hospitalization. Disposition - poor overall prognosis in light of recent arrest and co-morbidities.  Pt is full code.  Recommend goals of care meeting with family as he is still full code but does not want dialysis.  Donetta Potts, MD [employer] [phone]

## 2021-05-25 NOTE — Progress Notes (Signed)
Inpatient Rehab Admissions Coordinator:   I met with pt and family and spoke to Pt's daughter over the phone regarding potential CIR admission. They are interested in pursuing CIR for Pt. And family plans to rotate to provide 24/7 support at d/c. Pt id not quite medically ready for CIR at this time, but I will follow for potential admit pending medical readiness, insurance auth, and bed availability.  Clemens Catholic, [place], [place] Admissions Coordinator  [phone] ([place]) [phone] (office)

## 2021-05-26 LAB — CBC
HCT: 26.2 % — ABNORMAL LOW (ref 39.0–52.0)
Hemoglobin: 8 g/dL — ABNORMAL LOW (ref 13.0–17.0)
MCH: 31.4 pg (ref 26.0–34.0)
MCHC: 30.5 g/dL (ref 30.0–36.0)
MCV: 102.7 fL — ABNORMAL HIGH (ref 80.0–100.0)
Platelets: 139 10*3/uL — ABNORMAL LOW (ref 150–400)
RBC: 2.55 MIL/uL — ABNORMAL LOW (ref 4.22–5.81)
RDW: 13.2 % (ref 11.5–15.5)
WBC: 4.6 10*3/uL (ref 4.0–10.5)
nRBC: 0 % (ref 0.0–0.2)

## 2021-05-26 LAB — MAGNESIUM: Magnesium: 2.2 mg/dL (ref 1.7–2.4)

## 2021-05-26 LAB — GLUCOSE, CAPILLARY
Glucose-Capillary: 106 mg/dL — ABNORMAL HIGH (ref 70–99)
Glucose-Capillary: 110 mg/dL — ABNORMAL HIGH (ref 70–99)
Glucose-Capillary: 131 mg/dL — ABNORMAL HIGH (ref 70–99)
Glucose-Capillary: 133 mg/dL — ABNORMAL HIGH (ref 70–99)
Glucose-Capillary: 133 mg/dL — ABNORMAL HIGH (ref 70–99)
Glucose-Capillary: 143 mg/dL — ABNORMAL HIGH (ref 70–99)
Glucose-Capillary: 91 mg/dL (ref 70–99)

## 2021-05-26 LAB — RENAL FUNCTION PANEL
Albumin: 2.7 g/dL — ABNORMAL LOW (ref 3.5–5.0)
Anion gap: 14 (ref 5–15)
BUN: 86 mg/dL — ABNORMAL HIGH (ref 8–23)
CO2: 26 mmol/L (ref 22–32)
Calcium: 8.7 mg/dL — ABNORMAL LOW (ref 8.9–10.3)
Chloride: 98 mmol/L (ref 98–111)
Creatinine, Ser: 3.78 mg/dL — ABNORMAL HIGH (ref 0.61–1.24)
GFR, Estimated: 15 mL/min — ABNORMAL LOW
Glucose, Bld: 96 mg/dL (ref 70–99)
Phosphorus: 6.2 mg/dL — ABNORMAL HIGH (ref 2.5–4.6)
Potassium: 3.9 mmol/L (ref 3.5–5.1)
Sodium: 138 mmol/L (ref 135–145)

## 2021-05-26 LAB — POCT I-STAT 7, (LYTES, BLD GAS, ICA,H+H)
Acid-base deficit: 1 mmol/L (ref 0.0–2.0)
Bicarbonate: 27 mmol/L (ref 20.0–28.0)
Calcium, Ion: 1.21 mmol/L (ref 1.15–1.40)
HCT: 26 % — ABNORMAL LOW (ref 39.0–52.0)
Hemoglobin: 8.8 g/dL — ABNORMAL LOW (ref 13.0–17.0)
O2 Saturation: 83 %
Potassium: 4.2 mmol/L (ref 3.5–5.1)
Sodium: 137 mmol/L (ref 135–145)
TCO2: 29 mmol/L (ref 22–32)
pCO2 arterial: 60.3 mmHg — ABNORMAL HIGH (ref 32.0–48.0)
pH, Arterial: 7.259 — ABNORMAL LOW (ref 7.350–7.450)
pO2, Arterial: 56 mmHg — ABNORMAL LOW (ref 83.0–108.0)

## 2021-05-26 LAB — LACTIC ACID, PLASMA: Lactic Acid, Venous: 0.8 mmol/L (ref 0.5–1.9)

## 2021-05-26 MED ORDER — PANTOPRAZOLE SODIUM 40 MG PO TBEC
40.0000 mg | DELAYED_RELEASE_TABLET | Freq: Every day | ORAL | Status: DC
  Administered 2021-05-27 – 2021-05-30 (×4): 40 mg via ORAL
  Filled 2021-05-26 (×4): qty 1

## 2021-05-26 MED ORDER — ISOSORBIDE MONONITRATE ER 30 MG PO TB24
30.0000 mg | ORAL_TABLET | Freq: Every day | ORAL | Status: DC
  Administered 2021-05-26 – 2021-05-30 (×5): 30 mg via ORAL
  Filled 2021-05-26 (×5): qty 1

## 2021-05-26 MED ORDER — CHLORHEXIDINE GLUCONATE 0.12 % MT SOLN
OROMUCOSAL | Status: AC
  Filled 2021-05-26: qty 15

## 2021-05-26 NOTE — Progress Notes (Signed)
Patient ID: [name], male   DOB: [birth year], 86 y.o.   MRN: [id number] S: No events overnight.  Off of pressors O:BP (!) 131/56   Pulse 74   Temp 97.7 F (36.5 C) (Oral)   Resp 14   Ht 5' 4" (1.626 m)   Wt 85 kg   SpO2 99%   BMI 32.17 kg/m   Intake/Output Summary (Last 24 hours) at 05/26/2021 1012 Last data filed at 05/26/2021 0700 Gross per 24 hour  Intake 400.03 ml  Output 2340 ml  Net -1939.97 ml   Intake/Output: I/O last 3 completed shifts: In: 1098.2 [P.O.:780; I.V.:117.5; IV Piggyback:200.7] Out: 0940 [Urine:3175]  Intake/Output this shift:  No intake/output data recorded. Weight change: -1.3 kg Gen: NAD CVS: RRR Resp: occ rhonchi bilaterally Abd: +BS, soft, NT/ND Ext: 1+ pretibial edema of RLE, s/p LBKA  Recent Labs  Lab 05/21/21 0102 05/22/21 0026 05/22/21 0741 05/22/21 0912 05/23/21 0325 05/23/21 2253 05/24/21 0429 05/25/21 0413 05/26/21 0526  NA 131* 132* 132* 133* 133* 135 134* 134* 138  K 4.7 4.8 4.4 4.2 3.5 4.7 4.7 4.7 3.9  CL 97* 97* 96*  --  98 98 100 96* 98  CO2 23 21* 21*  --  _0 GLUCOSE 344* 226* 332*  --  241* 168* 115* 113* 96  BUN 80* 85* 88*  --  83* 81* 82* 85* 86*  CREATININE 4.14* 3.81* 3.96*  --  3.45* 3.64* 3.75* 3.97* 3.78*  ALBUMIN 2.9* 3.2* 3.2*  --  2.7*  --  2.9* 3.0* 2.7*  CALCIUM 7.9* 8.2* 8.4*  --  8.0* 8.3* 8.6* 8.7* 8.7*  PHOS 6.7* 6.7*  --   --  3.1  --  6.1* 7.1* 6.2*  AST  --   --  50*  --   --   --   --   --   --   ALT  --   --  42  --   --   --   --   --   --    Liver Function Tests: Recent Labs  Lab 05/22/21 0741 05/23/21 0325 05/24/21 0429 05/25/21 0413 05/26/21 0526  AST 50*  --   --   --   --   ALT 42  --   --   --   --   ALKPHOS 71  --   --   --   --   BILITOT 0.7  --   --   --   --   PROT 6.6  --   --   --   --   ALBUMIN 3.2*   < > 2.9* 3.0* 2.7*   < > = values in this interval not displayed.   No results for input(s): LIPASE, AMYLASE in the last 168 hours. No results for  input(s): AMMONIA in the last 168 hours. CBC: Recent Labs  Lab 05/22/21 0741 05/22/21 0912 05/23/21 0325 05/24/21 0429 05/25/21 0413 05/26/21 0526  WBC 13.9*  --  6.9 7.6 8.1 4.6  HGB 10.8*   < > 8.7* 8.7* 8.5* 8.0*  HCT 34.7*   < > 26.9* 28.4* 27.5* 26.2*  MCV 101.8*  --  97.8 102.5* 102.2* 102.7*  PLT 182  --  143* 136* 133* 139*   < > = values in this interval not displayed.   Cardiac Enzymes: No results for input(s): CKTOTAL, CKMB, CKMBINDEX, TROPONINI in the last 168 hours. CBG: Recent Labs  Lab 05/25/21 1123 05/25/21 1539  05/25/21 2026 05/26/21 0015 05/26/21 0409  GLUCAP 110* 151* 141* 106* 91    Iron Studies:  Recent Labs    05/25/21 0413  IRON 17*  TIBC 224*  FERRITIN 137   Studies/Results: DG Swallowing Func-Speech Pathology  Result Date: 05/24/2021 Formatting of this result is different from the original. Objective Swallowing Evaluation: Type of Study: MBS-Modified Barium Swallow Study  Patient Details Name: [name] MRN: [id number] Date of Birth: [birth year] Today's Date: 05/24/2021 Time: SLP Start Time (ACUTE ONLY): 1215 -SLP Stop Time (ACUTE ONLY): 4854 SLP Time Calculation (min) (ACUTE ONLY): 12 min Past Medical History: Past Medical History: Diagnosis Date  Anemia   Arthritis   Carotid artery disease (HCC)   CHF (congestive heart failure) (HCC)   CKD (chronic kidney disease), stage IV ([place])   Coronary artery disease 2010  Status post coronary artery bypass grafting  Diabetes mellitus   Type II  Dyspnea   Dysrhythmia   Foot ulcer ([place])   History of blood transfusion 05/2018  Hyperlipidemia   Hypertension   Hypothyroidism   Myocardial infarction ([place]) 2010  PAD (peripheral artery disease) ([place])   Postoperative atrial fibrillation ([place])   Thrombocytopenia ([place])  Past Surgical History: Past Surgical History: Procedure Laterality Date  AMPUTATION Left 06/16/2018  Procedure: LEFT TRANSMETATARSAL AMPUTATION;  Surgeon: [name], MD;  Location: [place] [facility];  Service:  Orthopedics;  Laterality: Left;  AMPUTATION Left 07/16/2018  Procedure: LEFT BELOW KNEE AMPUTATION;  Surgeon: [name], MD;  Location: [place];  Service: Orthopedics;  Laterality: Left;  AORTOGRAM Left 06/03/2018  Procedure: ABDOMINAL AORTOGRAM WITH CARBON DIOXIDE LEFT LOWER EXTREMITY RUNOFF;  Surgeon: Conrad [place], MD;  Location: [facility];  Service: Vascular;  Laterality: Left;  BELOW KNEE LEG AMPUTATION Left 07/16/2018  carotid insuff    CORONARY ARTERY BYPASS GRAFT  2010  LIMA to LAD, SVG to diagonal, SVG to circumflex, marginal, SVG to posterior descending branch.  FEMORAL-POPLITEAL BYPASS GRAFT Left 06/10/2018  Procedure: LEFT COMMON FEMORAL TO BELOW KNEE POPLITEAL BYPASS GRAFT WITH PROPATEN;  Surgeon: Conrad [place], MD;  Location: [place];  Service: Vascular;  Laterality: Left;  LEFT HEART CATH AND CORONARY ANGIOGRAPHY N/A 2021/06/14  Procedure: LEFT HEART CATH AND CORONARY ANGIOGRAPHY;  Surgeon: Burnell Blanks, MD;  Location: [facility] [facility] [facility];  Service: Cardiovascular;  Laterality: N/A; HPI: 86 yo admitted 6/4 with SOB with respiratory failure and volume overload with CHF exacerbation. Jun 15, 2023 pt coded with bradycardia and PEA arrest with intubation 6/8-6/9. Arrest suspected to be precipitated by respiratory/possible aspiration event, as per MD note, "RT suctioned copius amounts of secretions after intubation and there are reports that his airways was congestion with material that required suctioning." PMhx: CAD s/p CABG, HTN, HLD, PAD, Lt BKA, CKD, DM  Subjective: denies trouble swallowing Assessment / Plan / Recommendation CHL IP CLINICAL IMPRESSIONS 05/24/2021 Clinical Impression Pt's oropharyngeal swallowing is grossly WFL, with a single instance of aspiration that occurred before the swallow due to impaired timing as the pt drank thin liquids via concsecutive straw sips. This did trigger an immediate cough response, which is how his sensory system should react to try to protect his airway, although his  cough was not strong enough to eject the aspirates from his airway. No further aspiration occurred despite additional challenging including more sequential boluses, cup vs straw delivery methods, and mixed consistencies. Recommend continuing with regular solids and thin liquids with use of aspiration precautions. Would monitor closely for any additional coughing during PO  intake, and SLP will f/u briefly as well. SLP Visit Diagnosis Dysphagia, unspecified (R13.10) Attention and concentration deficit following -- Frontal lobe and executive function deficit following -- Impact on safety and function Mild aspiration risk   CHL IP TREATMENT RECOMMENDATION 05/24/2021 Treatment Recommendations Therapy as outlined in treatment plan below   Prognosis 05/24/2021 Prognosis for Safe Diet Advancement Good Barriers to Reach Goals -- Barriers/Prognosis Comment -- CHL IP DIET RECOMMENDATION 05/24/2021 SLP Diet Recommendations Regular solids;Thin liquid Liquid Administration via Cup;Straw Medication Administration Whole meds with liquid Compensations Slow rate;Small sips/bites Postural Changes Seated upright at 90 degrees   CHL IP OTHER RECOMMENDATIONS 05/24/2021 Recommended Consults -- Oral Care Recommendations Oral care BID Other Recommendations --   CHL IP FOLLOW UP RECOMMENDATIONS 05/24/2021 Follow up Recommendations Inpatient Rehab   CHL IP FREQUENCY AND DURATION 05/24/2021 Speech Therapy Frequency (ACUTE ONLY) min 2x/week Treatment Duration 1 week      CHL IP ORAL PHASE 05/24/2021 Oral Phase WFL Oral - Pudding Teaspoon -- Oral - Pudding Cup -- Oral - Honey Teaspoon -- Oral - Honey Cup -- Oral - Nectar Teaspoon -- Oral - Nectar Cup -- Oral - Nectar Straw -- Oral - Thin Teaspoon -- Oral - Thin Cup -- Oral - Thin Straw -- Oral - Puree -- Oral - Mech Soft -- Oral - Regular -- Oral - Multi-Consistency -- Oral - Pill -- Oral Phase - Comment --  CHL IP PHARYNGEAL PHASE 05/24/2021 Pharyngeal Phase Impaired Pharyngeal- Pudding Teaspoon --  Pharyngeal -- Pharyngeal- Pudding Cup -- Pharyngeal -- Pharyngeal- Honey Teaspoon -- Pharyngeal -- Pharyngeal- Honey Cup -- Pharyngeal -- Pharyngeal- Nectar Teaspoon -- Pharyngeal -- Pharyngeal- Nectar Cup -- Pharyngeal -- Pharyngeal- Nectar Straw -- Pharyngeal -- Pharyngeal- Thin Teaspoon -- Pharyngeal -- Pharyngeal- Thin Cup WFL Pharyngeal -- Pharyngeal- Thin Straw Penetration/Aspiration before swallow Pharyngeal Material enters airway, passes BELOW cords and not ejected out despite cough attempt by patient Pharyngeal- Puree WFL Pharyngeal -- Pharyngeal- Mechanical Soft -- Pharyngeal -- Pharyngeal- Regular WFL Pharyngeal -- Pharyngeal- Multi-consistency -- Pharyngeal -- Pharyngeal- Pill WFL Pharyngeal -- Pharyngeal Comment --  CHL IP CERVICAL ESOPHAGEAL PHASE 05/24/2021 Cervical Esophageal Phase WFL Pudding Teaspoon -- Pudding Cup -- Honey Teaspoon -- Honey Cup -- Nectar Teaspoon -- Nectar Cup -- Nectar Straw -- Thin Teaspoon -- Thin Cup -- Thin Straw -- Puree -- Mechanical Soft -- Regular -- Multi-consistency -- Pill -- Cervical Esophageal Comment -- Osie Bond., M.A. CCC-SLP Acute Rehabilitation Services Pager [phone] Office [phone] 05/24/2021, 1:36 PM               aspirin  81 mg Oral Daily   atorvastatin  40 mg Oral QHS   chlorhexidine       chlorhexidine gluconate (MEDLINE KIT)  15 mL Mouth Rinse BID   Chlorhexidine Gluconate Cloth  6 each Topical Daily   darbepoetin (ARANESP) injection - NON-DIALYSIS  40 mcg Subcutaneous Q Fri-1800   docusate sodium  100 mg Oral BID   furosemide  80 mg Intravenous Daily   gabapentin  300 mg Oral Daily   heparin injection (subcutaneous)  5,000 Units Subcutaneous Q8H   hydrALAZINE  25 mg Oral Q8H   insulin aspart  0-15 Units Subcutaneous Q4H   insulin aspart  4 Units Subcutaneous Q4H   insulin detemir  10 Units Subcutaneous BID   ipratropium-albuterol  3 mL Nebulization TID   isosorbide mononitrate  30 mg Oral Daily   levothyroxine  50 mcg Oral QAC  breakfast   mouth rinse  15  mL Mouth Rinse BID   pantoprazole (PROTONIX) IV  40 mg Intravenous Daily   polyethylene glycol  17 g Oral Daily   sodium chloride flush  10-40 mL Intracatheter Q12H   sodium chloride flush  3 mL Intravenous Q12H    BMET    Component Value Date/Time   NA 138 05/26/2021 0526   K 3.9 05/26/2021 0526   CL 98 05/26/2021 0526   CO2 26 05/26/2021 0526   GLUCOSE 96 05/26/2021 0526   BUN 86 (H) 05/26/2021 0526   CREATININE 3.78 (H) 05/26/2021 0526   CALCIUM 8.7 (L) 05/26/2021 0526   GFRNONAA 15 (L) 05/26/2021 0526   GFRAA 21 (L) 11/27/2019 0633   CBC    Component Value Date/Time   WBC 4.6 05/26/2021 0526   RBC 2.55 (L) 05/26/2021 0526   HGB 8.0 (L) 05/26/2021 0526   HCT 26.2 (L) 05/26/2021 0526   PLT 139 (L) 05/26/2021 0526   MCV 102.7 (H) 05/26/2021 0526   MCH 31.4 05/26/2021 0526   MCHC 30.5 05/26/2021 0526   RDW 13.2 05/26/2021 0526   LYMPHSABS 1.5 11/25/2019 1731   MONOABS 0.5 11/25/2019 1731   EOSABS 0.4 11/25/2019 1731   BASOSABS 0.0 11/25/2019 1731    Assessment/Plan:   Bradycardic arrest - not due to electrolyte abnormalities and likely primary cardiac in nature.  Discussed case with Dr. Oval Linsey and agree with [facility].  Would cont to hold diuretics pre-cath and limit contrast.  Pt is a poor long-term dialysis candidate and he and his daughter are in agreement for short term trial of HD if needed post cath. LHC revealed 3/4 patent bypass grafts, no targets for intervention EP evaluated and no pacemaker needed AKI/CKD stage IV - in setting of acute on chronic diastolic CHF and escalating diuretics consistent with cardiorenal syndrome.  His Scr increased from 3.89 to 4.18 after UOP of 3.9 liters with lasix 120 mg IV tid.  It was decreased to bid on 05/20/21 but still with 3.5 liters of UOP. BUN/Creat up slightly since restarting Lasix 80 mg IV and cardiac cath. He is not a suitable candidate for long-term dialysis given his poor functional status and  multiple comorbidities.  Per Dr. Jonnie Finner, he and his daughter are not in favor of dialysis and will continue to treat conservatively. Continue to follow for now.  Maybe able to change to po lasix in the am. Acute on chronic diastolic CHF - responded to IV lasix 120 mg tid with 3.9 liters but rise in BUN/Cr.  Will decrease frequency to daily and follow.  He was on lasix 40 mg daily but may require switch to torsemide at time of discharge.  He is not sure if he was taking the furosemide regularly "I've been having trouble remembering things".  Has diuresed over 6 liters since admission. lasix 80 mg IV daily and follow.   Hopefully can transition to po soon. Anemia of CKD stage IV - follow H/H, iron stores.  will start ESA HTN - stable IDDM - per primary AMS - pt very confused this am.  Unclear if this is delirium or if he has some underlying dementia.  Doubt uremia as his BUN/Cr have been higher earlier in his hospitalization. Disposition - poor overall prognosis in light of recent arrest and co-morbidities.  Pt is full code.  Recommend goals of care meeting with family as he is still full code but does not want dialysis.  Donetta Potts, MD [employer] [phone]

## 2021-05-26 NOTE — Progress Notes (Signed)
Progress Note  Patient Name: [name] Date of Encounter: 05/26/2021  [facility] Cardiologist: Sanda Klein, MD   Subjective   Doing well this morning. Eating breakfast. Denies SOB or chest pain.   Remains off pressors Net negative 1.9L on lasix 6m IV daily  Inpatient Medications    Scheduled Meds:  aspirin  81 mg Oral Daily   atorvastatin  40 mg Oral QHS   chlorhexidine gluconate (MEDLINE KIT)  15 mL Mouth Rinse BID   Chlorhexidine Gluconate Cloth  6 each Topical Daily   darbepoetin (ARANESP) injection - NON-DIALYSIS  40 mcg Subcutaneous Q Fri-1800   docusate sodium  100 mg Oral BID   furosemide  80 mg Intravenous Daily   gabapentin  300 mg Oral Daily   heparin injection (subcutaneous)  5,000 Units Subcutaneous Q8H   hydrALAZINE  25 mg Oral Q8H   insulin aspart  0-15 Units Subcutaneous Q4H   insulin aspart  4 Units Subcutaneous Q4H   insulin detemir  10 Units Subcutaneous BID   ipratropium-albuterol  3 mL Nebulization TID   isosorbide mononitrate  15 mg Oral Daily   levothyroxine  50 mcg Oral QAC breakfast   mouth rinse  15 mL Mouth Rinse BID   pantoprazole (PROTONIX) IV  40 mg Intravenous Daily   polyethylene glycol  17 g Oral Daily   sodium chloride flush  10-40 mL Intracatheter Q12H   sodium chloride flush  3 mL Intravenous Q12H   Continuous Infusions:  sodium chloride Stopped (05/25/21 1616)   sodium chloride     norepinephrine (LEVOPHED) Adult infusion Stopped (05/23/21 1320)   piperacillin-tazobactam (ZOSYN)  IV 2.25 g (05/26/21 0659)   PRN Meds: sodium chloride, acetaminophen, fentaNYL (SUBLIMAZE) injection, fentaNYL (SUBLIMAZE) injection, ipratropium-albuterol, midazolam, midazolam, naphazoline-pheniramine, nitroGLYCERIN, ondansetron (ZOFRAN) IV, oxyCODONE, sodium chloride flush, sodium chloride flush, traZODone   Vital Signs    Vitals:   05/26/21 0600 05/26/21 0637 05/26/21 0700 05/26/21 0702  BP: 127/63  (!) 131/56   Pulse: 74 79 74 75   Resp: '16 18 15 15  ' Temp:      TempSrc:      SpO2: 100% 100% 97% 99%  Weight:      Height:        Intake/Output Summary (Last 24 hours) at 05/26/2021 0744 Last data filed at 05/26/2021 0530 Gross per 24 hour  Intake 641.77 ml  Output 2540 ml  Net -1898.23 ml    Last 3 Weights 05/26/2021 05/25/2021 05/24/2021  Weight (lbs) 187 lb 6.3 oz 190 lb 4.1 oz 191 lb 5.8 oz  Weight (kg) 85 kg 86.3 kg 86.8 kg      Telemetry    Sinus with frequent PVCs, brief runs of NSVT- Personally Reviewed  ECG    No new tracing- Personally Reviewed  Physical Exam   VS:  BP (!) 131/56   Pulse 75   Temp 97.7 F (36.5 C) (Oral)   Resp 15   Ht '5\' 4"'  (1.626 m)   Wt 85 kg   SpO2 99%   BMI 32.17 kg/m  , BMI Body mass index is 32.17 kg/m. GENERAL:  Chronically ill appearing, comfortable, sitting up in bed HEENT: Normal, PERRL NECK:  No carotid bruits LUNGS: Diminished but clear. No wheezes HEART:  RRR. No murmurs ABD:  Soft, ND, NTTP EXT:  L BKA.  Trace R LE edema, no cyanosis no clubbing SKIN:  No rashes no nodules NEURO:  Alert and interactive PSYCH:  Normal affect  Labs  High Sensitivity Troponin:   Recent Labs  Lab 05/19/21 0610 05/22/21 0741 05/22/21 0926  TROPONINIHS 593* 155* 125*       Chemistry Recent Labs  Lab 05/22/21 0741 05/22/21 0912 05/23/21 0325 05/23/21 2253 05/24/21 0429 05/25/21 0413  NA 132*   < > 133* 135 134* 134*  K 4.4   < > 3.5 4.7 4.7 4.7  CL 96*  --  98 98 100 96*  CO2 21*  --  '22 22 23 24  ' GLUCOSE 332*  --  241* 168* 115* 113*  BUN 88*  --  83* 81* 82* 85*  CREATININE 3.96*  --  3.45* 3.64* 3.75* 3.97*  CALCIUM 8.4*  --  8.0* 8.3* 8.6* 8.7*  PROT 6.6  --   --   --   --   --   ALBUMIN 3.2*  --  2.7*  --  2.9* 3.0*  AST 50*  --   --   --   --   --   ALT 42  --   --   --   --   --   ALKPHOS 71  --   --   --   --   --   BILITOT 0.7  --   --   --   --   --   GFRNONAA 14*  --  17* 16* 15* 14*  ANIONGAP 15  --  '13 15 11 14   ' < > = values in  this interval not displayed.      Hematology Recent Labs  Lab 05/24/21 0429 05/25/21 0413 05/26/21 0526  WBC 7.6 8.1 4.6  RBC 2.77* 2.69* 2.55*  HGB 8.7* 8.5* 8.0*  HCT 28.4* 27.5* 26.2*  MCV 102.5* 102.2* 102.7*  MCH 31.4 31.6 31.4  MCHC 30.6 30.9 30.5  RDW 13.2 13.2 13.2  PLT 136* 133* 139*     BNP Recent Labs  Lab 05/19/21 1422  BNP 799.7*      DDimer  Recent Labs  Lab 05/20/21 1015  DDIMER 1.82*      Radiology    DG Swallowing Func-Speech Pathology  Result Date: 05/24/2021 Formatting of this result is different from the original. Objective Swallowing Evaluation: Type of Study: MBS-Modified Barium Swallow Study  Patient Details Name: [name] MRN: [id number] Date of Birth: 04-02-1935 Today's Date: 05/24/2021 Time: SLP Start Time (ACUTE ONLY): 1215 -SLP Stop Time (ACUTE ONLY): 0045 SLP Time Calculation (min) (ACUTE ONLY): 12 min Past Medical History: Past Medical History: Diagnosis Date  Anemia   Arthritis   Carotid artery disease (HCC)   CHF (congestive heart failure) (HCC)   CKD (chronic kidney disease), stage IV ([place])   Coronary artery disease 2010  Status post coronary artery bypass grafting  Diabetes mellitus   Type II  Dyspnea   Dysrhythmia   Foot ulcer ([place])   History of blood transfusion 05/2018  Hyperlipidemia   Hypertension   Hypothyroidism   Myocardial infarction ([place]) 2010  PAD (peripheral artery disease) (HCC)   Postoperative atrial fibrillation ([place])   Thrombocytopenia ([place])  Past Surgical History: Past Surgical History: Procedure Laterality Date  AMPUTATION Left 06/16/2018  Procedure: LEFT TRANSMETATARSAL AMPUTATION;  Surgeon: [name], MD;  Location: [place];  Service: Orthopedics;  Laterality: Left;  AMPUTATION Left 07/16/2018  Procedure: LEFT BELOW KNEE AMPUTATION;  Surgeon: [name], MD;  Location: [facility];  Service: Orthopedics;  Laterality: Left;  AORTOGRAM Left 06/03/2018  Procedure: ABDOMINAL AORTOGRAM WITH CARBON DIOXIDE LEFT LOWER EXTREMITY  RUNOFF;  Surgeon: Conrad [place], MD;  Location: [place];  Service: Vascular;  Laterality: Left;  BELOW KNEE LEG AMPUTATION Left 07/16/2018  carotid insuff    CORONARY ARTERY BYPASS GRAFT  2010  LIMA to LAD, SVG to diagonal, SVG to circumflex, marginal, SVG to posterior descending branch.  FEMORAL-POPLITEAL BYPASS GRAFT Left 06/10/2018  Procedure: LEFT COMMON FEMORAL TO BELOW KNEE POPLITEAL BYPASS GRAFT WITH PROPATEN;  Surgeon: Conrad [place], MD;  Location: [facility];  Service: Vascular;  Laterality: Left;  LEFT HEART CATH AND CORONARY ANGIOGRAPHY N/A 06/07/21  Procedure: LEFT HEART CATH AND CORONARY ANGIOGRAPHY;  Surgeon: Burnell Blanks, MD;  Location: [facility];  Service: Cardiovascular;  Laterality: N/A; HPI: 85 yo admitted 6/4 with SOB with respiratory failure and volume overload with CHF exacerbation. 2023-06-08 pt coded with bradycardia and PEA arrest with intubation 6/8-6/9. Arrest suspected to be precipitated by respiratory/possible aspiration event, as per MD note, "RT suctioned copius amounts of secretions after intubation and there are reports that his airways was congestion with material that required suctioning." PMhx: CAD s/p CABG, HTN, HLD, PAD, Lt BKA, CKD, DM  Subjective: denies trouble swallowing Assessment / Plan / Recommendation CHL IP CLINICAL IMPRESSIONS 05/24/2021 Clinical Impression Pt's oropharyngeal swallowing is grossly WFL, with a single instance of aspiration that occurred before the swallow due to impaired timing as the pt drank thin liquids via concsecutive straw sips. This did trigger an immediate cough response, which is how his sensory system should react to try to protect his airway, although his cough was not strong enough to eject the aspirates from his airway. No further aspiration occurred despite additional challenging including more sequential boluses, cup vs straw delivery methods, and mixed consistencies. Recommend continuing with regular solids and thin liquids with use  of aspiration precautions. Would monitor closely for any additional coughing during PO intake, and SLP will f/u briefly as well. SLP Visit Diagnosis Dysphagia, unspecified (R13.10) Attention and concentration deficit following -- Frontal lobe and executive function deficit following -- Impact on safety and function Mild aspiration risk   CHL IP TREATMENT RECOMMENDATION 05/24/2021 Treatment Recommendations Therapy as outlined in treatment plan below   Prognosis 05/24/2021 Prognosis for Safe Diet Advancement Good Barriers to Reach Goals -- Barriers/Prognosis Comment -- CHL IP DIET RECOMMENDATION 05/24/2021 SLP Diet Recommendations Regular solids;Thin liquid Liquid Administration via Cup;Straw Medication Administration Whole meds with liquid Compensations Slow rate;Small sips/bites Postural Changes Seated upright at 90 degrees   CHL IP OTHER RECOMMENDATIONS 05/24/2021 Recommended Consults -- Oral Care Recommendations Oral care BID Other Recommendations --   CHL IP FOLLOW UP RECOMMENDATIONS 05/24/2021 Follow up Recommendations Inpatient Rehab   CHL IP FREQUENCY AND DURATION 05/24/2021 Speech Therapy Frequency (ACUTE ONLY) min 2x/week Treatment Duration 1 week      CHL IP ORAL PHASE 05/24/2021 Oral Phase WFL Oral - Pudding Teaspoon -- Oral - Pudding Cup -- Oral - Honey Teaspoon -- Oral - Honey Cup -- Oral - Nectar Teaspoon -- Oral - Nectar Cup -- Oral - Nectar Straw -- Oral - Thin Teaspoon -- Oral - Thin Cup -- Oral - Thin Straw -- Oral - Puree -- Oral - Mech Soft -- Oral - Regular -- Oral - Multi-Consistency -- Oral - Pill -- Oral Phase - Comment --  CHL IP PHARYNGEAL PHASE 05/24/2021 Pharyngeal Phase Impaired Pharyngeal- Pudding Teaspoon -- Pharyngeal -- Pharyngeal- Pudding Cup -- Pharyngeal -- Pharyngeal- Honey Teaspoon -- Pharyngeal -- Pharyngeal- Honey Cup -- Pharyngeal -- Pharyngeal- Nectar Teaspoon -- Pharyngeal --  Pharyngeal- Nectar Cup -- Pharyngeal -- Pharyngeal- Nectar Straw -- Pharyngeal -- Pharyngeal- Thin Teaspoon  -- Pharyngeal -- Pharyngeal- Thin Cup WFL Pharyngeal -- Pharyngeal- Thin Straw Penetration/Aspiration before swallow Pharyngeal Material enters airway, passes BELOW cords and not ejected out despite cough attempt by patient Pharyngeal- Puree WFL Pharyngeal -- Pharyngeal- Mechanical Soft -- Pharyngeal -- Pharyngeal- Regular WFL Pharyngeal -- Pharyngeal- Multi-consistency -- Pharyngeal -- Pharyngeal- Pill WFL Pharyngeal -- Pharyngeal Comment --  CHL IP CERVICAL ESOPHAGEAL PHASE 05/24/2021 Cervical Esophageal Phase WFL Pudding Teaspoon -- Pudding Cup -- Honey Teaspoon -- Honey Cup -- Nectar Teaspoon -- Nectar Cup -- Nectar Straw -- Thin Teaspoon -- Thin Cup -- Thin Straw -- Puree -- Mechanical Soft -- Regular -- Multi-consistency -- Pill -- Cervical Esophageal Comment -- Osie Bond., M.A. [facility] Pager [phone] Office [phone] 05/24/2021, 1:36 PM                Cardiac Studies  ECHO 05/22/21: IMPRESSIONS   1. Left ventricular ejection fraction, by estimation, is 30 to 35%. The  left ventricle has moderately decreased function. The left ventricle  demonstrates global hypokinesis. There is mild left ventricular  hypertrophy. Left ventricular diastolic  parameters are consistent with Grade I diastolic dysfunction (impaired  relaxation). There is the interventricular septum is flattened in systole  and diastole, consistent with right ventricular pressure and volume  overload.   2. Right ventricular systolic function is normal. The right ventricular  size is mildly enlarged. There is moderately elevated pulmonary artery  systolic pressure. The estimated right ventricular systolic pressure is  38.1 mmHg.   3. Left atrial size was mild to moderately dilated.   4. Right atrial size was mildly dilated.   5. The mitral valve is normal in structure. Mild mitral valve  regurgitation. No evidence of mitral stenosis. Severe mitral annular  calcification.   6. Tricuspid valve  regurgitation is moderate.   7. The aortic valve is normal in structure. There is mild calcification  of the aortic valve. There is mild thickening of the aortic valve. Aortic  valve regurgitation is not visualized. Mild aortic valve sclerosis is  present, with no evidence of aortic  valve stenosis.   8. The inferior vena cava is dilated in size with <50% respiratory  variability, suggesting right atrial pressure of 15 mmHg.   ECHO 05/19/2021   1. Left ventricular ejection fraction, by estimation, is 50 to 55%. The  left ventricle has low normal function. The left ventricle demonstrates  regional wall motion abnormalities (see scoring diagram/findings for  description). There is mild left  ventricular hypertrophy. Left ventricular diastolic parameters are  indeterminate.   2. Right ventricular systolic function is moderately reduced. The right  ventricular size is moderately enlarged. There is severely elevated  pulmonary artery systolic pressure. The estimated right ventricular  systolic pressure is 77.1 mmHg.   3. Left atrial size was mildly dilated.   4. The mitral valve is abnormal. Trivial mitral valve regurgitation.  Severe mitral annular calcification.   5. The aortic valve is tricuspid. There is mild calcification of the  aortic valve. Aortic valve regurgitation is not visualized. Mild to  moderate aortic valve sclerosis/calcification is present, without any  evidence of aortic stenosis. Aortic valve mean   gradient measures 5.0 mmHg.   6. The inferior vena cava is normal in size with <50% respiratory  variability, suggesting right atrial pressure of 8 mmHg.    Echocardiogram: 05/2018 Study Conclusions   - Left ventricle:  The cavity size was normal. Wall thickness was    increased in a pattern of mild LVH. Systolic function was normal.    The estimated ejection fraction was in the range of 55% to 60%.    Wall motion was normal; there were no regional wall motion     abnormalities. Features are consistent with a pseudonormal left    ventricular filling pattern, with concomitant abnormal relaxation    and increased filling pressure (grade 2 diastolic dysfunction).  - Aortic valve: Mildly calcified annulus. Mildly thickened, mildly    calcified leaflets.  - Mitral valve: Mildly calcified annulus. Moderately thickened,    moderately calcified leaflets .     Patient Profile     86 y.o. male with CAD status post CABG, PAD status post left-pop bypass 05/2017 and left BKA, hypertension, hyperlipidemia, diabetes, carotid stenosis, and stage IV CKD admitted with hypoxic respiratory failure, volume overload, and elevated troponin.  Admitted to [facility] after a cardiac arrest on 6/8 precipitated by bradycardia. Now improving and extubated.   Assessment & Plan    # Brady-arrest: Patient became bradycardic to the 20s and lost his pulse.  He was on metoprolol 12.33m bid at the time. He had epinephrine and brief CPR with ROSC.  He was evaluated by EP and not thought to be a candidate for PPM.  They felt it was PEA and not primarily bradycardia. He underwent left heart cath which showed 3 out of 4 of his grafts were patent and it was not thought to be primarily an ischemic event.  HS troponin was downtrending.   He has a long first degree AV block and very frequent ventricular ectopy.  Would continue to avoid nodal agents for now and continue to monitor closely on telemetry. He was evaluated with bedside swallow and there was no frank aspiration.   -Hold nodal agents and monitor -Per EP, not a candidate for PPM  # Acute systolic and diastolic heart failure:  # Pulmonary hypertension:  Left ventricular systolic function was relatively preserved on echo 05/19/2021.  On repeat 05/22/2021 LVEF was reduced to 30 to 35%.  Right atrial pressure was 15 mmHg.  Co-ox was low at 59.  Initially on levophed now weaned off. No beta blocker as above.  No ACE-I/ARB given his renal dysfunction.  No  targets for PCI on cath 05/22/21 and 3/4 grafts were patent. -Diuretics per nephrology; responding well to lasix 842mIV daily -No BB due to brady arrest as above -No ACE/ARB/ARNI due to renal dysfunction -Continue hydralazine 2535m8h; up-titrate as tolerated -Increase imdur to 1m68mily today -Monitor I/Os and daily weights -Overall, poor prognosis and patient does not want HD; agree with palliative assessment  # Acute on chronic renal failure:  Appreciate nephrology guiding his diuresis.  His renal function has improved with diuretics.   -Diuresis per renal; responding well to lasix 80mg36mdaily  # CAD s/p CABG:  # Elevated troponin: # Hyperlipidemia:  CABG in 2010 with LIMA-LAD, SVG-D1, SVG-LCx and SVG-PDA.  High-sensitivity troponin was elevated to 937 at outside hospital.  Now downtrending.  Left heart cath revealed severe native vessel disease in the LAD and OM 2.  3 out of 4 bypass grafts were patent including the LIMA to the LAD.  There were no targets for PCI.  -Continue ASA 81mg 66my -Continue lipitor 40mg d77m -No BB/ACE/ARB as above  # PVCs:  Frequent PVCs on telemetry with ventricular bigeminy and NSVT.   Hold metoprolol 2/2 bradycardia  and brady arrest. -Off BB due to brady arrest as above  # PAD: S/p L BKA.  Wheelchair bound. -Continue ASA 23m daily -Continue lipitor 425mdaily  # Bladder obstruction: Foley placed 6/7. -Management per primary  CRITICAL CARE TIME: I have spent a total of 35 minutes with patient reviewing hospital notes, telemetry, EKGs, labs and examining the patient as well as establishing an assessment and plan that was discussed with the patient.  > 50% of time was spent in direct patient care. The patient is critically ill with multi-organ system failure and requires high complexity decision making for assessment and support, frequent evaluation and titration of therapies, application of advanced monitoring technologies and extensive  interpretation of multiple databases.   For questions or updates, please contact [facility] consult [email or web address] for contact info under        Signed, HeFreada BergeronMD  05/26/2021, 7:44 AM

## 2021-05-27 DIAGNOSIS — Z515 Encounter for palliative care: Secondary | ICD-10-CM

## 2021-05-27 DIAGNOSIS — R531 Weakness: Secondary | ICD-10-CM

## 2021-05-27 DIAGNOSIS — Z7189 Other specified counseling: Secondary | ICD-10-CM

## 2021-05-27 LAB — CBC
HCT: 27.7 % — ABNORMAL LOW (ref 39.0–52.0)
Hemoglobin: 8.5 g/dL — ABNORMAL LOW (ref 13.0–17.0)
MCH: 31.6 pg (ref 26.0–34.0)
MCHC: 30.7 g/dL (ref 30.0–36.0)
MCV: 103 fL — ABNORMAL HIGH (ref 80.0–100.0)
Platelets: 147 10*3/uL — ABNORMAL LOW (ref 150–400)
RBC: 2.69 MIL/uL — ABNORMAL LOW (ref 4.22–5.81)
RDW: 13.1 % (ref 11.5–15.5)
WBC: 4.9 10*3/uL (ref 4.0–10.5)
nRBC: 0 % (ref 0.0–0.2)

## 2021-05-27 LAB — GLUCOSE, CAPILLARY
Glucose-Capillary: 109 mg/dL — ABNORMAL HIGH (ref 70–99)
Glucose-Capillary: 138 mg/dL — ABNORMAL HIGH (ref 70–99)
Glucose-Capillary: 164 mg/dL — ABNORMAL HIGH (ref 70–99)
Glucose-Capillary: 183 mg/dL — ABNORMAL HIGH (ref 70–99)
Glucose-Capillary: 131 mg/dL — ABNORMAL HIGH (ref 70–99)
Glucose-Capillary: 201 mg/dL — ABNORMAL HIGH (ref 70–99)

## 2021-05-27 LAB — RENAL FUNCTION PANEL
Albumin: 2.8 g/dL — ABNORMAL LOW (ref 3.5–5.0)
Anion gap: 10 (ref 5–15)
BUN: 86 mg/dL — ABNORMAL HIGH (ref 8–23)
CO2: 28 mmol/L (ref 22–32)
Calcium: 8.5 mg/dL — ABNORMAL LOW (ref 8.9–10.3)
Chloride: 100 mmol/L (ref 98–111)
Creatinine, Ser: 3.9 mg/dL — ABNORMAL HIGH (ref 0.61–1.24)
GFR, Estimated: 14 mL/min — ABNORMAL LOW (ref >60)
Glucose, Bld: 124 mg/dL — ABNORMAL HIGH (ref 70–99)
Phosphorus: 6.2 mg/dL — ABNORMAL HIGH (ref 2.5–4.6)
Potassium: 3.9 mmol/L (ref 3.5–5.1)
Sodium: 138 mmol/L (ref 135–145)

## 2021-05-27 LAB — MAGNESIUM: Magnesium: 2.5 mg/dL — ABNORMAL HIGH (ref 1.7–2.4)

## 2021-05-27 NOTE — Progress Notes (Addendum)
Inpatient Rehab Admissions Coordinator:    I do not have a bed on CIR for this Pt. Today. I opened a case with Pt.'s insurance this AM and anticipate a response tomorrow. Note that Pt. ESRD, declining dialysis and increasingly confused today. He may not be a candidate for CIR even if insurance approves.  Palliative to meet with family today to clarify [place].   Clemens Catholic, [place], [place] Admissions Coordinator  [phone] ([place]) [phone] (office)

## 2021-05-27 NOTE — Progress Notes (Signed)
Progress Note  Patient Name: [name] Date of Encounter: 05/28/2021  [facility] Cardiologist: Sanda Klein, MD   Subjective   Awake and alert and eating breakfast this morning.  No acute events overnight. Refused BiPAP overnight. Blood pressures stable 110-120s/60-60s HR 60-70s with frequent ectopy Cr stable at 3.7, BUN 87 Net negative 1.6 L on lasix 42m IV daily  Inpatient Medications    Scheduled Meds:  aspirin  81 mg Oral Daily   atorvastatin  40 mg Oral QHS   chlorhexidine gluconate (MEDLINE KIT)  15 mL Mouth Rinse BID   Chlorhexidine Gluconate Cloth  6 each Topical Daily   darbepoetin (ARANESP) injection - NON-DIALYSIS  40 mcg Subcutaneous Q Fri-1800   docusate sodium  100 mg Oral BID   furosemide  80 mg Intravenous Daily   gabapentin  300 mg Oral Daily   heparin injection (subcutaneous)  5,000 Units Subcutaneous Q8H   hydrALAZINE  25 mg Oral Q8H   insulin aspart  0-15 Units Subcutaneous Q4H   insulin detemir  10 Units Subcutaneous BID   ipratropium-albuterol  3 mL Nebulization TID   isosorbide mononitrate  30 mg Oral Daily   levothyroxine  50 mcg Oral QAC breakfast   mouth rinse  15 mL Mouth Rinse BID   pantoprazole  40 mg Oral Daily   polyethylene glycol  17 g Oral Daily   sodium chloride flush  10-40 mL Intracatheter Q12H   sodium chloride flush  3 mL Intravenous Q12H   Continuous Infusions:  sodium chloride 10 mL/hr at 05/28/21 0400   sodium chloride     piperacillin-tazobactam (ZOSYN)  IV 2.25 g (05/28/21 0613)   PRN Meds: sodium chloride, acetaminophen, fentaNYL (SUBLIMAZE) injection, fentaNYL (SUBLIMAZE) injection, ipratropium-albuterol, midazolam, midazolam, naphazoline-pheniramine, nitroGLYCERIN, ondansetron (ZOFRAN) IV, oxyCODONE, sodium chloride flush, sodium chloride flush, traZODone   Vital Signs    Vitals:   05/28/21 0530 05/28/21 0545 05/28/21 0600 05/28/21 0615  BP: (!) 115/57  (!) 120/54   Pulse: 75 72 73 79  Resp: _0 Temp:      TempSrc:      SpO2: 98% 98% 95% 94%  Weight:      Height:        Intake/Output Summary (Last 24 hours) at 05/28/2021 0736 Last data filed at 05/28/2021 0400 Gross per 24 hour  Intake 364.89 ml  Output 2001 ml  Net -1636.11 ml   Last 3 Weights 05/28/2021 05/27/2021 05/26/2021  Weight (lbs) 188 lb 0.8 oz 163 lb 9.3 oz 187 lb 6.3 oz  Weight (kg) 85.3 kg 74.2 kg 85 kg      Telemetry    NSR, PVCs, NSVT (unchanged)- Personally Reviewed  ECG    No new ECG on 05/28/21- Personally Reviewed  Physical Exam   VS:  BP (!) 120/54   Pulse 79   Temp (!) 97.3 F (36.3 C) (Axillary)   Resp 17   Ht _1  (1.626 m)   Wt 85.3 kg   SpO2 94%   BMI 32.28 kg/m  , BMI Body mass index is 32.28 kg/m. GENERAL:  Chronically ill appearing, awake, confused this morning HEENT: Normal, PERRL NECK:  No carotid bruits LUNGS: Diminished at the bases; scattered wheezes HEART:  RRR, no murmurs ABD:  Soft, ND, NTTP EXT:  L BKA.  Trace R LE edema, no cyanosis no clubbing. Warm SKIN:  No rashes no nodules NEURO: Alert and interactive but remains confused PSYCH:  Normal affect  Labs  High Sensitivity Troponin:   Recent Labs  Lab 05/19/21 0610 05/22/21 0741 05/22/21 0926  TROPONINIHS 593* 155* 125*      Chemistry Recent Labs  Lab 05/22/21 0741 05/22/21 0912 05/26/21 0526 05/26/21 1023 05/27/21 0452 05/28/21 0425  NA 132*   < > 138 137 138 138  K 4.4   < > 3.9 4.2 3.9 3.9  CL 96*   < > 98  --  100 98  CO2 21*   < > 26  --  28 29  GLUCOSE 332*   < > 96  --  124* 109*  BUN 88*   < > 86*  --  86* 86*  CREATININE 3.96*   < > 3.78*  --  3.90* 3.67*  CALCIUM 8.4*   < > 8.7*  --  8.5* 8.6*  PROT 6.6  --   --   --   --   --   ALBUMIN 3.2*   < > 2.7*  --  2.8* 2.9*  AST 50*  --   --   --   --   --   ALT 42  --   --   --   --   --   ALKPHOS 71  --   --   --   --   --   BILITOT 0.7  --   --   --   --   --   GFRNONAA 14*   < > 15*  --  14* 16*  ANIONGAP 15   < > 14  --  10  11   < > = values in this interval not displayed.     Hematology Recent Labs  Lab 05/26/21 0526 05/26/21 1023 05/27/21 0452 05/28/21 0425  WBC 4.6  --  4.9 4.7  RBC 2.55*  --  2.69* 2.65*  HGB 8.0* 8.8* 8.5* 8.3*  HCT 26.2* 26.0* 27.7* 27.3*  MCV 102.7*  --  103.0* 103.0*  MCH 31.4  --  31.6 31.3  MCHC 30.5  --  30.7 30.4  RDW 13.2  --  13.1 13.1  PLT 139*  --  147* 160    BNP No results for input(s): BNP, PROBNP in the last 168 hours.    DDimer  No results for input(s): DDIMER in the last 168 hours.    Radiology    No results found.   Cardiac Studies  ECHO 05/22/21: IMPRESSIONS   1. Left ventricular ejection fraction, by estimation, is 30 to 35%. The  left ventricle has moderately decreased function. The left ventricle  demonstrates global hypokinesis. There is mild left ventricular  hypertrophy. Left ventricular diastolic  parameters are consistent with Grade I diastolic dysfunction (impaired  relaxation). There is the interventricular septum is flattened in systole  and diastole, consistent with right ventricular pressure and volume  overload.   2. Right ventricular systolic function is normal. The right ventricular  size is mildly enlarged. There is moderately elevated pulmonary artery  systolic pressure. The estimated right ventricular systolic pressure is  32.6 mmHg.   3. Left atrial size was mild to moderately dilated.   4. Right atrial size was mildly dilated.   5. The mitral valve is normal in structure. Mild mitral valve  regurgitation. No evidence of mitral stenosis. Severe mitral annular  calcification.   6. Tricuspid valve regurgitation is moderate.   7. The aortic valve is normal in structure. There is mild calcification  of the aortic valve. There is mild thickening of  the aortic valve. Aortic  valve regurgitation is not visualized. Mild aortic valve sclerosis is  present, with no evidence of aortic  valve stenosis.   8. The inferior vena  cava is dilated in size with <50% respiratory  variability, suggesting right atrial pressure of 15 mmHg.   ECHO 05/19/2021   1. Left ventricular ejection fraction, by estimation, is 50 to 55%. The  left ventricle has low normal function. The left ventricle demonstrates  regional wall motion abnormalities (see scoring diagram/findings for  description). There is mild left  ventricular hypertrophy. Left ventricular diastolic parameters are  indeterminate.   2. Right ventricular systolic function is moderately reduced. The right  ventricular size is moderately enlarged. There is severely elevated  pulmonary artery systolic pressure. The estimated right ventricular  systolic pressure is 41.3 mmHg.   3. Left atrial size was mildly dilated.   4. The mitral valve is abnormal. Trivial mitral valve regurgitation.  Severe mitral annular calcification.   5. The aortic valve is tricuspid. There is mild calcification of the  aortic valve. Aortic valve regurgitation is not visualized. Mild to  moderate aortic valve sclerosis/calcification is present, without any  evidence of aortic stenosis. Aortic valve mean   gradient measures 5.0 mmHg.   6. The inferior vena cava is normal in size with <50% respiratory  variability, suggesting right atrial pressure of 8 mmHg.    Echocardiogram: 05/2018 Study Conclusions   - Left ventricle: The cavity size was normal. Wall thickness was    increased in a pattern of mild LVH. Systolic function was normal.    The estimated ejection fraction was in the range of 55% to 60%.    Wall motion was normal; there were no regional wall motion    abnormalities. Features are consistent with a pseudonormal left    ventricular filling pattern, with concomitant abnormal relaxation    and increased filling pressure (grade 2 diastolic dysfunction).  - Aortic valve: Mildly calcified annulus. Mildly thickened, mildly    calcified leaflets.  - Mitral valve: Mildly calcified  annulus. Moderately thickened,    moderately calcified leaflets .     Patient Profile     86 y.o. male with CAD status post CABG, PAD status post left-pop bypass 05/2017 and left BKA, hypertension, hyperlipidemia, diabetes, carotid stenosis, and stage IV CKD admitted with hypoxic respiratory failure, volume overload, and elevated troponin.  Admitted to [facility] after a cardiac arrest on 6/8 precipitated by bradycardia. Now improved and extubated.   Assessment & Plan    # Brady-arrest: Patient became bradycardic to the 20s and lost his pulse.  He was on metoprolol 12.38m bid at the time. He had epinephrine and brief CPR with ROSC.  He was evaluated by EP and not thought to be a candidate for PPM.  They felt it was PEA and not primarily bradycardia. He underwent left heart cath which showed 3 out of 4 of his grafts were patent and it was not thought to be primarily an ischemic event.  HS troponin was downtrending.   He has a long first degree AV block and very frequent ventricular ectopy.  Would continue to avoid nodal agents for now and continue to monitor closely on telemetry. He was evaluated with bedside swallow and there was no frank aspiration.   -Hold nodal agents and monitor -Per EP, not a candidate for PPM -Palliative care consulted; overall poor prognosis with limited options going forward and patient does not wish to pursue HD  # Acute  systolic and diastolic heart failure:  # Pulmonary hypertension:  Left ventricular systolic function was relatively preserved on echo 05/19/2021.  On repeat 05/22/2021 LVEF was reduced to 30 to 35%.  Right atrial pressure was 15 mmHg.  Co-ox was low at 59.  Initially on levophed now weaned off. No beta blocker as above.  No ACE-I/ARB given his renal dysfunction.  No targets for PCI on cath 05/22/21 and 3/4 grafts were patent. -Diuretics per nephrology; responding well to lasix 66m IV daily -No BB due to brady arrest as above -No ACE/ARB/ARNI due to renal  dysfunction -Continue hydralazine 256mq8h -Continue imdur 3050maily  -Monitor I/Os and daily weights -Overall, poor prognosis and patient does not want HD; palliative care consulted and will discuss ongoing GOCStover Acute on chronic renal failure:  Appreciate nephrology guiding his diuresis.  Does not want to proceed with HD. -Diuresis per renal; responding well to lasix 18m30m daily  # CAD s/p CABG:  # Elevated troponin: # Hyperlipidemia:  CABG in 2010 with LIMA-LAD, SVG-D1, SVG-LCx and SVG-PDA.  High-sensitivity troponin was elevated to 937 at outside hospital.  Now downtrending.  Left heart cath revealed severe native vessel disease in the LAD and OM 2.  3 out of 4 bypass grafts were patent including the LIMA to the LAD.  There were no targets for PCI.  -Continue ASA 81mg21mly -Continue lipitor 40mg 8my -No BB/ACE/ARB as above  # PVCs:  Frequent PVCs on telemetry with ventricular bigeminy and NSVT.   Hold metoprolol 2/2 bradycardia and brady arrest. -Off BB due to brady arrest as above  # PAD: S/p L BKA.  Wheelchair bound. -Continue ASA 81mg d57m -Continue lipitor 40mg da34m # Bladder obstruction: Foley placed 6/7. -Management per primary  Overall poor prognosis and will need 24h care going forward. Recommended for CIR but may need to look into other options. Palliative care to see today. Will await inpatient rehab recommendations today as well.  CRITICAL CARE TIME: I have spent a total of 35 minutes with patient reviewing hospital notes, telemetry, EKGs, labs and examining the patient as well as establishing an assessment and plan that was discussed with the patient.  > 50% of time was spent in direct patient care. The patient is critically ill with multi-organ system failure and requires high complexity decision making for assessment and support, frequent evaluation and titration of therapies, application of advanced monitoring technologies and extensive interpretation of  multiple databases.   For questions or updates, please contact [facility] [email or web address] for contact info under        Signed, Kaamil Morefield Freada Bergeron14/2022, 7:36 AM

## 2021-05-27 NOTE — Progress Notes (Signed)
Pt has PRN Bipap orders.  Pt comfortably resting on 3L [place]. Bipap on standby if needed.

## 2021-05-27 NOTE — Progress Notes (Signed)
Occupational Therapy Treatment Patient Details Name: [name] MRN: [id number] DOB: [birth year] Today's Date: 05/27/2021    History of present illness 86 yo admitted 6/4 with SOb with respiratory failure and volume overload with CHf exacerbation. 06/22/23 pt coded with bradycardia and PEA arrest with intubation. 6/9 extubated. PMhx: CAD s/p CABG, HTN, HLD, PAD, Lt BKA, CKD, DM   OT comments  Patient in bed upon entry, oriented to self, month/year but requires cueing for place. Re-oriented to date, city, and situation; pt perseverating on "how long have I been here" during session. He presents with decreased recall, problem solving, awareness, but able pleasant and able to follow simple commands with increased time.  Engaged in self care tasks from chair position in bed due to fatigue/weakness, completing oral care and brushing hair with supervision.  Noted L elbow edema and encouraged elevation/exercises, completing B UE exercises as listed below x 2 sets.  Son present and encouraged son to assist with recall of exercises throughout the day- agreeable.  Updated dc plan to CIR to optimize independence with Adls, mobility to decrease burden of care.    Follow Up Recommendations  CIR;Supervision/Assistance - 24 hour    Equipment Recommendations  None recommended by OT    Recommendations for Other Services      Precautions / Restrictions Precautions Precautions: Fall Precaution Comments: Left BKA, watch HR Restrictions Weight Bearing Restrictions: No       Mobility Bed Mobility Overal bed mobility: Needs Assistance Bed Mobility: Sit to Supine     Supine to sit: Min assist     General bed mobility comments: remains in chair position in bed, pt fatigued from OOB this am; repositioned hips to assist with midline positioning in bed    Transfers Overall transfer level: Needs assistance   Transfers: Lateral/Scoot Transfers          Lateral/Scoot Transfers: Max assist;+2  physical assistance General transfer comment: max +2 for a lateral scoot semi squat pivot from low to high surface toward pt left with drop arm using pad to cradle sacrum. Pt able to hook arms with therapist with very limited assist during transfer    Balance Overall balance assessment: Needs assistance   Sitting balance-Leahy Scale: Fair Sitting balance - Comments: inital L lateral lean in bed but improved with positioning to supervision                                   ADL either performed or assessed with clinical judgement   ADL Overall ADL's : Needs assistance/impaired Eating/Feeding: Set up;Sitting Eating/Feeding Details (indicate cue type and reason): chair position in bed, assist to cut food and open containers; pt using R UE to self feed with increased time Grooming: Oral care;Supervision/safety;Set up;Brushing hair Grooming Details (indicate cue type and reason): sitting in chair position in bed, completing combing hair and suction oral care after setup with supervision given increased time                   Toilet Transfer Details (indicate cue type and reason): deferred due to fatigue           General ADL Comments: pt limited by weakness, L BKA, balance, cognition     Vision       Perception     Praxis      Cognition Arousal/Alertness: Awake/alert Behavior During Therapy: WFL for tasks assessed/performed Overall Cognitive Status: Impaired/Different from  baseline Area of Impairment: Orientation;Attention;Memory;Following commands;Safety/judgement;Problem solving;Awareness                 Orientation Level: Disoriented to;Place;Time;Situation Current Attention Level: Sustained Memory: Decreased short-term memory Following Commands: Follows one step commands consistently;Follows one step commands with increased time;Follows multi-step commands inconsistently Safety/Judgement: Decreased awareness of safety;Decreased awareness of  deficits Awareness: Intellectual Problem Solving: Slow processing;Requires verbal cues;Decreased initiation General Comments: pt oriented to self, able to verbalize hospital when asked to "look around", month/year but not date. Unable to recall date after 5 minutes.  Pt with slow processing and initation, but able to follow simple commands.        Exercises Exercises: General Upper Extremity General Exercises - Upper Extremity Shoulder Flexion: Both;10 reps;AROM;Seated (x 2 sets) Digit Composite Flexion: AROM;Both;10 reps;Seated (x 2 sets) Composite Extension: AROM;Both;10 reps;Seated (x 2 sets)   Shoulder Instructions       General Comments VSS on 3L [place]    Pertinent Vitals/ Pain       Pain Assessment: Faces Pain Score: 3  Faces Pain Scale: Hurts a little bit Pain Location: chest when coughing Pain Descriptors / Indicators: Discomfort Pain Intervention(s): Limited activity within patient's tolerance;Monitored during session;Repositioned  Home Living                                          Prior Functioning/Environment              Frequency  Min 2X/week        Progress Toward Goals  OT Goals(current goals can now be found in the care plan section)  Progress towards OT goals: Progressing toward goals  Acute Rehab OT Goals Patient Stated Goal: return home, fish  Plan Frequency remains appropriate;Discharge plan needs to be updated    Co-evaluation                 AM-PAC OT "6 Clicks" Daily Activity     Outcome Measure   Help from another person eating meals?: A Little Help from another person taking care of personal grooming?: A Little Help from another person toileting, which includes using toliet, bedpan, or urinal?: Total Help from another person bathing (including washing, rinsing, drying)?: A Lot Help from another person to put on and taking off regular upper body clothing?: A Little Help from another person to put on and  taking off regular lower body clothing?: Total 6 Click Score: 13    End of Session Equipment Utilized During Treatment: Oxygen (3L)  OT Visit Diagnosis: Other abnormalities of gait and mobility (R26.89);Muscle weakness (generalized) (M62.81)   Activity Tolerance Patient tolerated treatment well   Patient Left in bed;with call bell/phone within reach;with nursing/sitter in room;Other (comment) (bed in chair position)   Nurse Communication Mobility status        Time: [id number] OT Time Calculation (min): 22 min  Charges: OT General Charges $OT Visit: 1 Visit OT Treatments $Self Care/Home Management : 8-22 mins  Jolaine Artist, OT Acute Rehabilitation Services Pager [phone] Office [phone]    Delight Stare 05/27/2021, 11:59 AM

## 2021-05-27 NOTE — Consult Note (Signed)
Consultation Note Date: 05/27/2021   Patient Name: [name]  DOB: Jul 24, 1935  MRN: [id number]  Age / Sex: 86 y.o., male  PCP: Pcp, No Referring Physician: Sanda Klein, MD  Reason for Consultation: Establishing goals of care and Psychosocial/spiritual support  HPI/Patient Profile: 86 y.o. male   admitted on 05/18/2021 with past medical history of CAD (s/p CABG in 2010 with LIMA-LAD, SVG-D1, SVG-LCx and SVG-PDA), HFpEF, peripheral arterial disease (s/p left femoropopliteal bypass in 05/2018 and now left BKA), HTN, HLD, Type 2 DM, carotid artery disease, and Stage 4 CKD He is wheelchair bound at baseline 2/2 to amputation.  Reported increasing shortness of breath with minimal exertion.  Reported left lower extremity edema and orthopnea/ 2 pillow at baseline   Upon arrival to the ED, he was found to be hypoxic and oxygen saturations were in the 80's upon arrival. HR was initially in the 40's and he was in ventricular bigeminy (suspect bradycardia was due to PVC's not being counted). EKG showed no ischemic changes per the report.    Admitted for treatment stabilization..  Currently cardiac ICU.  Today is day 8 of this hospitalization.   Patient and family face treatment option decisions, advanced directive decisions and anticipatory care needs.   Clinical Assessment and Goals of Care:   This NP Wadie Lessen reviewed medical records, received report from team, assessed the patient and then meet at the patient's bedside and spoke by phone with his son/Wayne Kershaw to discuss diagnosis, prognosis, GOC, EOL wishes disposition and options.  Patient is intermittently, pleasantly confused and without medical decisional capacity at this time.   Concept of Palliative Care was introduced as specialized medical care for people and their families living with serious illness.  If focuses on providing relief from  the symptoms and stress of a serious illness.  The goal is to improve quality of life for both the patient and the family.  Values and goals of care important to patient and family were attempted to be elicited.    Created space and opportunity for patient  and family to explore thoughts and feelings regarding current medical situation.  Son verbalizes an understanding of the seriousness of his father's current medical situation, recognizes the difficult decisions he and his family face and hopes to speak with his sister before making any definitive decisions  Follow-up family meeting is set for tomorrow at 32 noon.   A  discussion was had today regarding advanced directives.  Concepts specific to code status, artifical feeding and hydration, continued IV antibiotics and rehospitalization was had.  The difference between a aggressive medical intervention path  and a palliative comfort care path for this patient at this time was had.    Questions and concerns addressed.  Patient  encouraged to call with questions or concerns.     PMT will continue to support holistically.            HPOA/son/Wayne Milliner      SUMMARY OF RECOMMENDATIONS    Code Status/Advance Care Planning:  Full code   Symptom Management:  Per attending  Palliative Prophylaxis:  Delirium Protocol, Frequent Pain Assessment, and Oral Care  Additional Recommendations (Limitations, Scope, Preferences): Full Scope Treatment  Psycho-social/Spiritual:  Desire for further Chaplaincy support:no   Prognosis:  Unable to determine  Discharge Planning: To Be Determined      Primary Diagnoses: Present on Admission: **None**   I have reviewed the medical record, interviewed the patient and family, and examined the patient. The following aspects are pertinent.  Past Medical History:  Diagnosis Date   Anemia    Arthritis    Carotid artery disease (HCC)    CHF (congestive heart failure) (HCC)    CKD  (chronic kidney disease), stage IV ([place])    Coronary artery disease 2010   Status post coronary artery bypass grafting   Diabetes mellitus    Type II   Dyspnea    Dysrhythmia    Foot ulcer ([place])    History of blood transfusion 05/2018   Hyperlipidemia    Hypertension    Hypothyroidism    Myocardial infarction ([place]) 2010   PAD (peripheral artery disease) (HCC)    Postoperative atrial fibrillation (HCC)    Thrombocytopenia (HCC)    Social History   Socioeconomic History   Marital status: Widowed    Spouse name: Not on file   Number of children: Not on file   Years of education: Not on file   Highest education level: Not on file  Occupational History   Not on file  Tobacco Use   Smoking status: Never   Smokeless tobacco: Never  Vaping Use   Vaping Use: Never used  Substance and Sexual Activity   Alcohol use: Not Currently    Alcohol/week: 0.0 standard drinks   Drug use: No   Sexual activity: Not on file  Other Topics Concern   Not on file  Social History Narrative   Lives with granddaughter   Social Determinants of Health   Financial Resource Strain: Not on file  Food Insecurity: Not on file  Transportation Needs: Not on file  Physical Activity: Not on file  Stress: Not on file  Social Connections: Not on file   Family History  Problem Relation Age of Onset   Heart attack Father    Heart attack Brother    Scheduled Meds:  aspirin  81 mg Oral Daily   atorvastatin  40 mg Oral QHS   chlorhexidine gluconate (MEDLINE KIT)  15 mL Mouth Rinse BID   Chlorhexidine Gluconate Cloth  6 each Topical Daily   darbepoetin (ARANESP) injection - NON-DIALYSIS  40 mcg Subcutaneous Q Fri-1800   docusate sodium  100 mg Oral BID   furosemide  80 mg Intravenous Daily   gabapentin  300 mg Oral Daily   heparin injection (subcutaneous)  5,000 Units Subcutaneous Q8H   hydrALAZINE  25 mg Oral Q8H   insulin aspart  0-15 Units Subcutaneous Q4H   insulin detemir  10 Units  Subcutaneous BID   ipratropium-albuterol  3 mL Nebulization TID   isosorbide mononitrate  30 mg Oral Daily   levothyroxine  50 mcg Oral QAC breakfast   mouth rinse  15 mL Mouth Rinse BID   pantoprazole  40 mg Oral Daily   polyethylene glycol  17 g Oral Daily   sodium chloride flush  10-40 mL Intracatheter Q12H   sodium chloride flush  3 mL Intravenous Q12H   Continuous Infusions:  sodium chloride 10 mL/hr at 05/27/21 1400   sodium chloride  piperacillin-tazobactam (ZOSYN)  IV 2.25 g (05/27/21 1421)   PRN Meds:.sodium chloride, acetaminophen, fentaNYL (SUBLIMAZE) injection, fentaNYL (SUBLIMAZE) injection, ipratropium-albuterol, midazolam, midazolam, naphazoline-pheniramine, nitroGLYCERIN, ondansetron (ZOFRAN) IV, oxyCODONE, sodium chloride flush, sodium chloride flush, traZODone Medications Prior to Admission:  Prior to Admission medications   Medication Sig Start Date End Date Taking? Authorizing Provider  amLODipine (NORVASC) 5 MG tablet Take 5 mg by mouth daily.   Yes [provider]  aspirin 81 MG tablet Take 81 mg by mouth at bedtime.    Yes [provider]  atorvastatin (LIPITOR) 40 MG tablet Take 40 mg by mouth at bedtime.  01/30/16  Yes [provider]  furosemide (LASIX) 40 MG tablet Take 40 mg by mouth daily as needed (swelling (blood pressure)).   Yes [provider]  insulin glargine (LANTUS) 100 UNIT/ML injection Inject 0.1 mLs (10 Units total) into the skin at bedtime. Patient taking differently: Inject 25-40 Units into the skin 2 (two) times daily. 25 units at night , 40 units in the morning. 11/28/19  Yes Johnson, Clanford L, MD  levothyroxine (SYNTHROID, LEVOTHROID) 50 MCG tablet Take 50 mcg by mouth daily before breakfast.    Yes [provider]  omega-3 fish oil (MAXEPA) 1000 MG CAPS capsule Take 1 capsule by mouth 3 (three) times daily. 04/19/21  Yes [provider]  pantoprazole (PROTONIX) 40 MG tablet Take 40 mg by  mouth daily. 11/05/19  Yes [provider]  potassium chloride (K-DUR,KLOR-CON) 10 MEQ tablet Take 10 mEq by mouth daily. 07/10/18  Yes [provider]  traZODone (DESYREL) 50 MG tablet Take 1 tablet (50 mg total) by mouth at bedtime as needed for sleep. Patient not taking: Reported on 05/19/2021 11/28/19   Murlean Iba, MD   No Known Allergies Review of Systems  Neurological:  Positive for weakness.   Physical Exam Constitutional:      Appearance: He is ill-appearing.  Cardiovascular:     Rate and Rhythm: Normal rate.  Pulmonary:     Breath sounds: Normal breath sounds.  Skin:    General: Skin is warm and dry.  Neurological:     Mental Status: He is lethargic.     Motor: Weakness present.    Vital Signs: BP 113/87   Pulse 81   Temp 97.6 F (36.4 C) (Axillary)   Resp 14   Ht '5\' 4"'  (1.626 m)   Wt 74.2 kg   SpO2 95%   BMI 28.08 kg/m  Pain Scale: 0-10 POSS *See Group Information*: S-Acceptable,Sleep, easy to arouse Pain Score: 0-No pain   SpO2: SpO2: 95 % O2 Device:SpO2: 95 % O2 Flow Rate: .O2 Flow Rate (L/min): 3 L/min  IO: Intake/output summary:  Intake/Output Summary (Last 24 hours) at 05/27/2021 1453 Last data filed at 05/27/2021 1400 Gross per 24 hour  Intake 384.83 ml  Output 1815 ml  Net -1430.17 ml    LBM: Last BM Date: 05/27/21 Baseline Weight: Weight: 84.6 kg Most recent weight: Weight: 74.2 kg     Palliative Assessment/Data:  30 %    Discussed with Clemens Catholic with Rehab/CIR  Time In   1400 Time Out: 1510 Time Total: 70 minutes Greater than 50%  of this time was spent counseling and coordinating care related to the above assessment and plan.  Signed by: Wadie Lessen, NP   Please contact Palliative Medicine Team phone at [phone] for questions and concerns.  For individual provider: See Shea Evans

## 2021-05-27 NOTE — Plan of Care (Signed)
  Problem: Education: Goal: Knowledge of General Education information will improve Description: Including pain rating scale, medication(s)/side effects and non-pharmacologic comfort measures Outcome: Progressing   Problem: Health Behavior/Discharge Planning: Goal: Ability to manage health-related needs will improve Outcome: Progressing   Problem: Clinical Measurements: Goal: Ability to maintain clinical measurements within normal limits will improve Outcome: Progressing Goal: Will remain free from infection Outcome: Progressing Goal: Diagnostic test results will improve Outcome: Progressing Goal: Respiratory complications will improve Outcome: Progressing Goal: Cardiovascular complication will be avoided Outcome: Progressing   Problem: Nutrition: Goal: Adequate nutrition will be maintained Outcome: Progressing   Problem: Coping: Goal: Level of anxiety will decrease Outcome: Progressing   Problem: Elimination: Goal: Will not experience complications related to bowel motility Outcome: Progressing Goal: Will not experience complications related to urinary retention Outcome: Progressing   Problem: Pain Managment: Goal: General experience of comfort will improve Outcome: Progressing   Problem: Safety: Goal: Ability to remain free from injury will improve Outcome: Progressing   Problem: Skin Integrity: Goal: Risk for impaired skin integrity will decrease Outcome: Progressing   Problem: Education: Goal: Ability to demonstrate management of disease process will improve Outcome: Progressing Goal: Ability to verbalize understanding of medication therapies will improve Outcome: Progressing Goal: Individualized Educational Video(s) Outcome: Progressing   Problem: Activity: Goal: Capacity to carry out activities will improve Outcome: Progressing   Problem: Cardiac: Goal: Ability to achieve and maintain adequate cardiopulmonary perfusion will improve Outcome:  Progressing   

## 2021-05-27 NOTE — Progress Notes (Signed)
  Speech Language Pathology Treatment: Dysphagia  Patient Details Name: [name] MRN: [id number] DOB: [birth year] Today's Date: 05/27/2021 Time: 1015-1030 SLP Time Calculation (min) (ACUTE ONLY): 15 min  Assessment / Plan / Recommendation Clinical Impression  RN reported coughing with sips of coffee this am. Pts son at bedside reports no baseline dysphagia. His MBS report and imaging reviewed; swallow WNL and esophageal sweep WNL. Pt denies any symptoms of reflux. Pt does report he "coughs all the time' though he was not observed to cough at all during this session. SLP observed pt take pills with water, alternate solids and liquids and complete a 3 oz water swallow. All subjectively appeared WNL with no coughing. RN will observe pt with meals today and report back. SLP to f/u once more and observe pt with a meal to determine if self feeding and pacing during a meal leads to increased signs of aspiration. Pt to continue regular diet and thin liquids at this time.   HPI HPI: 86 yo admitted 6/4 with SOB with respiratory failure and volume overload with CHF exacerbation. 06/10/2023 pt coded with bradycardia and PEA arrest with intubation 6/8-6/9. Arrest suspected to be precipitated by respiratory/possible aspiration event, as per MD note, "RT suctioned copius amounts of secretions after intubation and there are reports that his airways was congestion with material that required suctioning." PMhx: CAD s/p CABG, HTN, HLD, PAD, Lt BKA, CKD, DM      SLP Plan  Continue with current plan of care       Recommendations  Diet recommendations: Regular;Thin liquid Liquids provided via: Cup;Straw Medication Administration: Whole meds with liquid Supervision: Patient able to self feed Compensations: Slow rate;Small sips/bites Postural Changes and/or Swallow Maneuvers: Seated upright 90 degrees;Out of bed for meals                Oral Care Recommendations: Oral care BID Follow up Recommendations:  Inpatient Rehab SLP Visit Diagnosis: Dysphagia, unspecified (R13.10) Plan: Continue with current plan of care       GO              Herbie Baltimore, MA [place] Pager [phone] Office [phone]   Lynann Beaver 05/27/2021, 11:00 AM

## 2021-05-27 NOTE — Progress Notes (Addendum)
Physical Therapy Treatment Patient Details Name: [name] MRN: [id number] DOB: Mar 26, 1935 Today's Date: 05/27/2021    History of Present Illness 86 yo admitted 6/4 with SOb with respiratory failure and volume overload with CHf exacerbation. Jun 09, 2023 pt coded with bradycardia and PEA arrest with intubation. 6/9 extubated. PMhx: CAD s/p CABG, HTN, HLD, PAD, Lt BKA, CKD, DM    PT Comments    Pt with increased confusion not oriented beyond self and continues to demonstrate increased weakness and difficulty with all mobility. Transfers with increased difficulty due to confusion today but pt very pleasant and son present and understanding of continued rehab efforts with pt still appropriate for CIR as medical stability progresses. HR 88-110 SpO2 98% on 3L   Follow Up Recommendations  CIR;Supervision/Assistance - 24 hour     Equipment Recommendations  None recommended by PT    Recommendations for Other Services       Precautions / Restrictions Precautions Precautions: Fall Precaution Comments: Left BKA, watch HR    Mobility  Bed Mobility Overal bed mobility: Needs Assistance Bed Mobility: Sit to Supine     Supine to sit: Min assist     General bed mobility comments: min assist to lift RLE to surface with cues for sequence. max +2 to slide toward [facility]    Transfers Overall transfer level: Needs assistance   Transfers: Lateral/Scoot Transfers          Lateral/Scoot Transfers: Max assist;+2 physical assistance General transfer comment: max +2 for a lateral scoot semi squat pivot from low to high surface toward pt left with drop arm using pad to cradle sacrum. Pt able to hook arms with therapist with very limited assist during transfer  Ambulation/Gait                 Stairs             Wheelchair Mobility    Modified Rankin (Stroke Patients Only)       Balance Overall balance assessment: Needs assistance   Sitting balance-Leahy Scale:  Fair Sitting balance - Comments: EOb with and without UE support                                    Cognition Arousal/Alertness: Awake/alert Behavior During Therapy: WFL for tasks assessed/performed Overall Cognitive Status: Impaired/Different from baseline Area of Impairment: Orientation;Attention;Memory;Following commands;Safety/judgement                 Orientation Level: Disoriented to;Place;Time;Situation Current Attention Level: Sustained Memory: Decreased short-term memory Following Commands: Follows one step commands consistently;Follows one step commands with increased time Safety/Judgement: Decreased awareness of deficits     General Comments: pt oriented only to self and does not recall any of hospital admission. Very pleasant and following commands with slow processing      Exercises      General Comments        Pertinent Vitals/Pain Pain Score: 3  Pain Location: right knee and chest with coughing Pain Descriptors / Indicators: Aching;Guarding Pain Intervention(s): Limited activity within patient's tolerance;Monitored during session;Repositioned    Home Living                      Prior Function            PT Goals (current goals can now be found in the care plan section) Acute Rehab PT Goals Time For Goal Achievement: 06/10/21  Potential to Achieve Goals: Fair Progress towards PT goals: Not progressing toward goals - comment;Goals downgraded-see care plan    Frequency    Min 3X/week      PT Plan Current plan remains appropriate    Co-evaluation              AM-PAC PT "6 Clicks" Mobility   Outcome Measure  Help needed turning from your back to your side while in a flat bed without using bedrails?: A Little Help needed moving from lying on your back to sitting on the side of a flat bed without using bedrails?: A Little Help needed moving to and from a bed to a chair (including a wheelchair)?: Total Help  needed standing up from a chair using your arms (e.g., wheelchair or bedside chair)?: Total Help needed to walk in hospital room?: Total Help needed climbing 3-5 steps with a railing? : Total 6 Click Score: 10    End of Session Equipment Utilized During Treatment: Gait belt Activity Tolerance: Patient tolerated treatment well Patient left: in bed;with call bell/phone within reach;with nursing/sitter in room;with family/visitor present Nurse Communication: Mobility status;Precautions PT Visit Diagnosis: Other abnormalities of gait and mobility (R26.89);Muscle weakness (generalized) (M62.81)     Time: [id number] PT Time Calculation (min) (ACUTE ONLY): 21 min  Charges:  $Therapeutic Activity: 8-22 mins                     Kamaljit Hizer P, PT Acute Rehabilitation Services Pager: [phone] Office: [place] 05/27/2021, 9:18 AM

## 2021-05-27 NOTE — Progress Notes (Signed)
[facility] Progress Note   Assessment/ Plan:   Bradycardic arrest - not due to electrolyte abnormalities and likely primary cardiac in nature.  Discussed case with Dr. Oval Linsey and agree with [facility].  Would cont to hold diuretics pre-cath and limit contrast.  Pt is a poor long-term dialysis candidate and he and his daughter are in agreement for short term trial of HD if needed post cath. LHC revealed 3/4 patent bypass grafts, no targets for intervention EP evaluated and no pacemaker needed AKI/CKD stage IV - in setting of acute on chronic diastolic CHF and escalating diuretics consistent with cardiorenal syndrome.  His Scr increased from 3.89 to 4.18 after UOP of 3.9 liters with lasix 120 mg IV tid.  It was decreased to bid on 05/20/21 but still with 3.5 liters of UOP. BUN/Creat up slightly since restarting Lasix 80 mg IV and cardiac cath--> continue for now, Cr 3.7-3.9 the past 3 days He is not a suitable candidate for long-term dialysis given his poor functional status and multiple comorbidities. He and his family have confirmed no dialysis.  Acute on chronic diastolic CHF - responded to IV lasix 120 mg tid with 3.9 liters but rise in BUN/Cr.  Will decrease frequency to daily and follow.  He was on lasix 40 mg daily but may require switch to torsemide at time of discharge.  He is not sure if he was taking the furosemide regularly "I've been having trouble remembering things".  Has diuresed over 6 liters since admission. lasix 80 mg IV daily and follow.   Hopefully can transition to po soon. Anemia of CKD stage IV - follow H/H, iron stores.  will start ESA HTN - stable IDDM - per primary AMS - improved HCAP: on Zosyn Disposition - palliative care to be consulted today  Subjective:    Sitting up in chair this AM, son at bedside.  Reports some chest discomfort and has a pretty significant cough.  On Zosyn already for HCAP and WBC normalized.   Objective:   BP (!) 112/47    Pulse (!) 110   Temp 97.8 F (36.6 C) (Oral)   Resp 13   Ht _0  (1.626 m)   Wt 74.2 kg   SpO2 96%   BMI 28.08 kg/m   Physical Exam: Gen: NAD, sitting in chair, intermittent cough NECK: + JVD upright CVS: RRR Resp: bibasilar crackles Abd: soft, nontender NABS Ext: 1+ LE edema, improving  Labs: BMET Recent Labs  Lab 05/21/21 0102 05/22/21 0026 05/22/21 0741 05/22/21 0912 05/23/21 0325 05/23/21 2253 05/24/21 0429 05/25/21 0413 05/26/21 0526 05/26/21 1023 05/27/21 0452  NA 131* 132* 132*   < > 133* 135 134* 134* 138 137 138  K 4.7 4.8 4.4   < > 3.5 4.7 4.7 4.7 3.9 4.2 3.9  CL 97* 97* 96*  --  98 98 100 96* 98  --  100  CO2 23 21* 21*  --  _1 --  28  GLUCOSE 344* 226* 332*  --  241* 168* 115* 113* 96  --  124*  BUN 80* 85* 88*  --  83* 81* 82* 85* 86*  --  86*  CREATININE 4.14* 3.81* 3.96*  --  3.45* 3.64* 3.75* 3.97* 3.78*  --  3.90*  CALCIUM 7.9* 8.2* 8.4*  --  8.0* 8.3* 8.6* 8.7* 8.7*  --  8.5*  PHOS 6.7* 6.7*  --   --  3.1  --  6.1* 7.1* 6.2*  --  6.2*   < > = values in this interval not displayed.   CBC Recent Labs  Lab 05/24/21 0429 05/25/21 0413 05/26/21 0526 05/26/21 1023 05/27/21 0452  WBC 7.6 8.1 4.6  --  4.9  HGB 8.7* 8.5* 8.0* 8.8* 8.5*  HCT 28.4* 27.5* 26.2* 26.0* 27.7*  MCV 102.5* 102.2* 102.7*  --  103.0*  PLT 136* 133* 139*  --  147*      Medications:     aspirin  81 mg Oral Daily   atorvastatin  40 mg Oral QHS   chlorhexidine gluconate (MEDLINE KIT)  15 mL Mouth Rinse BID   Chlorhexidine Gluconate Cloth  6 each Topical Daily   darbepoetin (ARANESP) injection - NON-DIALYSIS  40 mcg Subcutaneous Q Fri-1800   docusate sodium  100 mg Oral BID   furosemide  80 mg Intravenous Daily   gabapentin  300 mg Oral Daily   heparin injection (subcutaneous)  5,000 Units Subcutaneous Q8H   hydrALAZINE  25 mg Oral Q8H   insulin aspart  0-15 Units Subcutaneous Q4H   insulin detemir  10 Units Subcutaneous BID   ipratropium-albuterol  3  mL Nebulization TID   isosorbide mononitrate  30 mg Oral Daily   levothyroxine  50 mcg Oral QAC breakfast   mouth rinse  15 mL Mouth Rinse BID   pantoprazole  40 mg Oral Daily   polyethylene glycol  17 g Oral Daily   sodium chloride flush  10-40 mL Intracatheter Q12H   sodium chloride flush  3 mL Intravenous Q12H     Madelon Lips MD 05/27/2021, 9:18 AM

## 2021-05-27 NOTE — Progress Notes (Signed)
Progress Note  Patient Name: [name] Date of Encounter: 05/27/2021  [facility] Cardiologist: Sanda Klein, MD   Subjective   More somnolent yesterday afternoon; ABG with pH 7.25, pCO2 60, pO2 56. Placed on BiPAP at that time. Lactate normal.  Palliative care also consulted given overall poor prognosis. This was discussed with the patient's son. Will see today.  Cr this AM 3.9; net negative 1.3 on lasix 5m IV daily  Today, he is much more awake and alert. Feels well. Continues with cough. BP 100-120s/50-60s, HR 70s  Inpatient Medications    Scheduled Meds:  aspirin  81 mg Oral Daily   atorvastatin  40 mg Oral QHS   chlorhexidine gluconate (MEDLINE KIT)  15 mL Mouth Rinse BID   Chlorhexidine Gluconate Cloth  6 each Topical Daily   darbepoetin (ARANESP) injection - NON-DIALYSIS  40 mcg Subcutaneous Q Fri-1800   docusate sodium  100 mg Oral BID   furosemide  80 mg Intravenous Daily   gabapentin  300 mg Oral Daily   heparin injection (subcutaneous)  5,000 Units Subcutaneous Q8H   hydrALAZINE  25 mg Oral Q8H   insulin aspart  0-15 Units Subcutaneous Q4H   insulin aspart  4 Units Subcutaneous Q4H   insulin detemir  10 Units Subcutaneous BID   ipratropium-albuterol  3 mL Nebulization TID   isosorbide mononitrate  30 mg Oral Daily   levothyroxine  50 mcg Oral QAC breakfast   mouth rinse  15 mL Mouth Rinse BID   pantoprazole  40 mg Oral Daily   polyethylene glycol  17 g Oral Daily   sodium chloride flush  10-40 mL Intracatheter Q12H   sodium chloride flush  3 mL Intravenous Q12H   Continuous Infusions:  sodium chloride 10 mL/hr at 05/27/21 0500   sodium chloride     norepinephrine (LEVOPHED) Adult infusion Stopped (05/23/21 1320)   piperacillin-tazobactam (ZOSYN)  IV 2.25 g (05/27/21 0541)   PRN Meds: sodium chloride, acetaminophen, fentaNYL (SUBLIMAZE) injection, fentaNYL (SUBLIMAZE) injection, ipratropium-albuterol, midazolam, midazolam,  naphazoline-pheniramine, nitroGLYCERIN, ondansetron (ZOFRAN) IV, oxyCODONE, sodium chloride flush, sodium chloride flush, traZODone   Vital Signs    Vitals:   05/27/21 0358 05/27/21 0400 05/27/21 0500 05/27/21 0650  BP:  130/60 (!) 112/47   Pulse: 74 74 74   Resp: _0 Temp:  98.8 F (37.1 C)  97.8 F (36.6 C)  TempSrc:  Axillary  Oral  SpO2:  99% 97%   Weight:   74.2 kg   Height:        Intake/Output Summary (Last 24 hours) at 05/27/2021 0710 Last data filed at 05/27/2021 0500 Gross per 24 hour  Intake 713.59 ml  Output 2025 ml  Net -1311.41 ml    Last 3 Weights 05/27/2021 05/26/2021 05/25/2021  Weight (lbs) 163 lb 9.3 oz 187 lb 6.3 oz 190 lb 4.1 oz  Weight (kg) 74.2 kg 85 kg 86.3 kg      Telemetry    Sinus rhythm, PVCs, brief runs of NSVT- Personally Reviewed  ECG    No new tracing- Personally Reviewed  Physical Exam   VS:  BP (!) 112/47   Pulse 74   Temp 97.8 F (36.6 C) (Oral)   Resp 13   Ht 5' 4" (1.626 m)   Wt 74.2 kg   SpO2 97%   BMI 28.08 kg/m  , BMI Body mass index is 28.08 kg/m. GENERAL:  Chronically ill appearing, more awake and alert this morning HEENT: Normal,  PERRL NECK:  No carotid bruits LUNGS: Bilbasilar rales with scattered expiratory wheezing HEART:  RRR. No murmurs ABD:  Soft, ND, NTTP EXT:  L BKA.  Trace R LE edema, no cyanosis no clubbing. Warm SKIN:  No rashes no nodules NEURO:  Alert and interactive today but confused. Does not know where he is.  PSYCH:  Normal affect  Labs    High Sensitivity Troponin:   Recent Labs  Lab 05/19/21 0610 05/22/21 0741 05/22/21 0926  TROPONINIHS 593* 155* 125*       Chemistry Recent Labs  Lab 05/22/21 0741 05/22/21 0912 05/25/21 0413 05/26/21 0526 05/26/21 1023 05/27/21 0452  NA 132*   < > 134* 138 137 138  K 4.4   < > 4.7 3.9 4.2 3.9  CL 96*   < > 96* 98  --  100  CO2 21*   < > 24 26  --  28  GLUCOSE 332*   < > 113* 96  --  124*  BUN 88*   < > 85* 86*  --  86*  CREATININE  3.96*   < > 3.97* 3.78*  --  3.90*  CALCIUM 8.4*   < > 8.7* 8.7*  --  8.5*  PROT 6.6  --   --   --   --   --   ALBUMIN 3.2*   < > 3.0* 2.7*  --  2.8*  AST 50*  --   --   --   --   --   ALT 42  --   --   --   --   --   ALKPHOS 71  --   --   --   --   --   BILITOT 0.7  --   --   --   --   --   GFRNONAA 14*   < > 14* 15*  --  14*  ANIONGAP 15   < > 14 14  --  10   < > = values in this interval not displayed.      Hematology Recent Labs  Lab 05/25/21 0413 05/26/21 0526 05/26/21 1023 05/27/21 0452  WBC 8.1 4.6  --  4.9  RBC 2.69* 2.55*  --  2.69*  HGB 8.5* 8.0* 8.8* 8.5*  HCT 27.5* 26.2* 26.0* 27.7*  MCV 102.2* 102.7*  --  103.0*  MCH 31.6 31.4  --  31.6  MCHC 30.9 30.5  --  30.7  RDW 13.2 13.2  --  13.1  PLT 133* 139*  --  147*     BNP No results for input(s): BNP, PROBNP in the last 168 hours.    DDimer  Recent Labs  Lab 05/20/21 1015  DDIMER 1.82*      Radiology    No results found.   Cardiac Studies  ECHO 05/22/21: IMPRESSIONS   1. Left ventricular ejection fraction, by estimation, is 30 to 35%. The  left ventricle has moderately decreased function. The left ventricle  demonstrates global hypokinesis. There is mild left ventricular  hypertrophy. Left ventricular diastolic  parameters are consistent with Grade I diastolic dysfunction (impaired  relaxation). There is the interventricular septum is flattened in systole  and diastole, consistent with right ventricular pressure and volume  overload.   2. Right ventricular systolic function is normal. The right ventricular  size is mildly enlarged. There is moderately elevated pulmonary artery  systolic pressure. The estimated right ventricular systolic pressure is  16.6 mmHg.   3. Left atrial size  was mild to moderately dilated.   4. Right atrial size was mildly dilated.   5. The mitral valve is normal in structure. Mild mitral valve  regurgitation. No evidence of mitral stenosis. Severe mitral annular   calcification.   6. Tricuspid valve regurgitation is moderate.   7. The aortic valve is normal in structure. There is mild calcification  of the aortic valve. There is mild thickening of the aortic valve. Aortic  valve regurgitation is not visualized. Mild aortic valve sclerosis is  present, with no evidence of aortic  valve stenosis.   8. The inferior vena cava is dilated in size with <50% respiratory  variability, suggesting right atrial pressure of 15 mmHg.   ECHO 05/19/2021   1. Left ventricular ejection fraction, by estimation, is 50 to 55%. The  left ventricle has low normal function. The left ventricle demonstrates  regional wall motion abnormalities (see scoring diagram/findings for  description). There is mild left  ventricular hypertrophy. Left ventricular diastolic parameters are  indeterminate.   2. Right ventricular systolic function is moderately reduced. The right  ventricular size is moderately enlarged. There is severely elevated  pulmonary artery systolic pressure. The estimated right ventricular  systolic pressure is 89.3 mmHg.   3. Left atrial size was mildly dilated.   4. The mitral valve is abnormal. Trivial mitral valve regurgitation.  Severe mitral annular calcification.   5. The aortic valve is tricuspid. There is mild calcification of the  aortic valve. Aortic valve regurgitation is not visualized. Mild to  moderate aortic valve sclerosis/calcification is present, without any  evidence of aortic stenosis. Aortic valve mean   gradient measures 5.0 mmHg.   6. The inferior vena cava is normal in size with <50% respiratory  variability, suggesting right atrial pressure of 8 mmHg.    Echocardiogram: 05/2018 Study Conclusions   - Left ventricle: The cavity size was normal. Wall thickness was    increased in a pattern of mild LVH. Systolic function was normal.    The estimated ejection fraction was in the range of 55% to 60%.    Wall motion was normal; there  were no regional wall motion    abnormalities. Features are consistent with a pseudonormal left    ventricular filling pattern, with concomitant abnormal relaxation    and increased filling pressure (grade 2 diastolic dysfunction).  - Aortic valve: Mildly calcified annulus. Mildly thickened, mildly    calcified leaflets.  - Mitral valve: Mildly calcified annulus. Moderately thickened,    moderately calcified leaflets .     Patient Profile     86 y.o. male with CAD status post CABG, PAD status post left-pop bypass 05/2017 and left BKA, hypertension, hyperlipidemia, diabetes, carotid stenosis, and stage IV CKD admitted with hypoxic respiratory failure, volume overload, and elevated troponin.  Admitted to [facility] after a cardiac arrest on 6/8 precipitated by bradycardia. Now improving and extubated.   Assessment & Plan    # Brady-arrest: Patient became bradycardic to the 20s and lost his pulse.  He was on metoprolol 12.86m bid at the time. He had epinephrine and brief CPR with ROSC.  He was evaluated by EP and not thought to be a candidate for PPM.  They felt it was PEA and not primarily bradycardia. He underwent left heart cath which showed 3 out of 4 of his grafts were patent and it was not thought to be primarily an ischemic event.  HS troponin was downtrending.   He has a long first degree  AV block and very frequent ventricular ectopy.  Would continue to avoid nodal agents for now and continue to monitor closely on telemetry. He was evaluated with bedside swallow and there was no frank aspiration.   -Hold nodal agents and monitor -Per EP, not a candidate for PPM -Palliative care consulted; overall poor prognosis with limited options going forward and patient does not wish to pursue HD  # Acute systolic and diastolic heart failure:  # Pulmonary hypertension:  Left ventricular systolic function was relatively preserved on echo 05/19/2021.  On repeat 05/22/2021 LVEF was reduced to 30 to 35%.  Right  atrial pressure was 15 mmHg.  Co-ox was low at 59.  Initially on levophed now weaned off. No beta blocker as above.  No ACE-I/ARB given his renal dysfunction.  No targets for PCI on cath 05/22/21 and 3/4 grafts were patent. -Diuretics per nephrology; responding well to lasix 23m IV daily -No BB due to brady arrest as above -No ACE/ARB/ARNI due to renal dysfunction -Continue hydralazine 231mq8h; up-titrate as tolerated -Continue imdur 3057maily  -Monitor I/Os and daily weights -Overall, poor prognosis and patient does not want HD; palliative care consulted  # Acute on chronic renal failure:  Appreciate nephrology guiding his diuresis.  Does not want to proceed with HD. -Diuresis per renal; responding well to lasix 56m83m daily  # CAD s/p CABG:  # Elevated troponin: # Hyperlipidemia:  CABG in 2010 with LIMA-LAD, SVG-D1, SVG-LCx and SVG-PDA.  High-sensitivity troponin was elevated to 937 at outside hospital.  Now downtrending.  Left heart cath revealed severe native vessel disease in the LAD and OM 2.  3 out of 4 bypass grafts were patent including the LIMA to the LAD.  There were no targets for PCI.  -Continue ASA 81mg9mly -Continue lipitor 40mg 39my -No BB/ACE/ARB as above  # PVCs:  Frequent PVCs on telemetry with ventricular bigeminy and NSVT.   Hold metoprolol 2/2 bradycardia and brady arrest. -Off BB due to brady arrest as above  # PAD: S/p L BKA.  Wheelchair bound. -Continue ASA 81mg d40m -Continue lipitor 40mg da10m # Bladder obstruction: Foley placed 6/7. -Management per primary  Plan discussed with the patient and his son at bedside.   CRITICAL CARE TIME: I have spent a total of 35 minutes with patient reviewing hospital notes, telemetry, EKGs, labs and examining the patient as well as establishing an assessment and plan that was discussed with the patient.  > 50% of time was spent in direct patient care. The patient is critically ill with multi-organ system failure  and requires high complexity decision making for assessment and support, frequent evaluation and titration of therapies, application of advanced monitoring technologies and extensive interpretation of multiple databases.   For questions or updates, please contact [facility] [email or web address] for contact info under        Signed, Domenic Schoenberger Freada Bergeron13/2022, 7:10 AM

## 2021-05-28 DIAGNOSIS — Z66 Do not resuscitate: Secondary | ICD-10-CM

## 2021-05-28 DIAGNOSIS — D638 Anemia in other chronic diseases classified elsewhere: Secondary | ICD-10-CM

## 2021-05-28 DIAGNOSIS — I493 Ventricular premature depolarization: Secondary | ICD-10-CM

## 2021-05-28 DIAGNOSIS — R627 Adult failure to thrive: Secondary | ICD-10-CM

## 2021-05-28 DIAGNOSIS — N320 Bladder-neck obstruction: Secondary | ICD-10-CM

## 2021-05-28 LAB — CBC
HCT: 27.3 % — ABNORMAL LOW (ref 39.0–52.0)
Hemoglobin: 8.3 g/dL — ABNORMAL LOW (ref 13.0–17.0)
MCH: 31.3 pg (ref 26.0–34.0)
MCHC: 30.4 g/dL (ref 30.0–36.0)
MCV: 103 fL — ABNORMAL HIGH (ref 80.0–100.0)
Platelets: 160 10*3/uL (ref 150–400)
RBC: 2.65 MIL/uL — ABNORMAL LOW (ref 4.22–5.81)
RDW: 13.1 % (ref 11.5–15.5)
WBC: 4.7 10*3/uL (ref 4.0–10.5)
nRBC: 0 % (ref 0.0–0.2)

## 2021-05-28 LAB — RENAL FUNCTION PANEL
Albumin: 2.9 g/dL — ABNORMAL LOW (ref 3.5–5.0)
Anion gap: 11 (ref 5–15)
BUN: 86 mg/dL — ABNORMAL HIGH (ref 8–23)
CO2: 29 mmol/L (ref 22–32)
Calcium: 8.6 mg/dL — ABNORMAL LOW (ref 8.9–10.3)
Chloride: 98 mmol/L (ref 98–111)
Creatinine, Ser: 3.67 mg/dL — ABNORMAL HIGH (ref 0.61–1.24)
GFR, Estimated: 16 mL/min — ABNORMAL LOW (ref >60)
Glucose, Bld: 109 mg/dL — ABNORMAL HIGH (ref 70–99)
Phosphorus: 5.8 mg/dL — ABNORMAL HIGH (ref 2.5–4.6)
Potassium: 3.9 mmol/L (ref 3.5–5.1)
Sodium: 138 mmol/L (ref 135–145)

## 2021-05-28 LAB — GLUCOSE, CAPILLARY
Glucose-Capillary: 108 mg/dL — ABNORMAL HIGH (ref 70–99)
Glucose-Capillary: 134 mg/dL — ABNORMAL HIGH (ref 70–99)
Glucose-Capillary: 164 mg/dL — ABNORMAL HIGH (ref 70–99)
Glucose-Capillary: 194 mg/dL — ABNORMAL HIGH (ref 70–99)
Glucose-Capillary: 216 mg/dL — ABNORMAL HIGH (ref 70–99)
Glucose-Capillary: 248 mg/dL — ABNORMAL HIGH (ref 70–99)

## 2021-05-28 MED ORDER — SODIUM CHLORIDE 0.9 % IV SOLN
510.0000 mg | INTRAVENOUS | Status: DC
  Administered 2021-05-28: 510 mg via INTRAVENOUS
  Filled 2021-05-28: qty 17

## 2021-05-28 NOTE — Progress Notes (Signed)
  Speech Language Pathology Treatment: Dysphagia  Patient Details Name: [name] MRN: [id number] DOB: [birth year] Today's Date: 05/28/2021 Time: [id number] SLP Time Calculation (min) (ACUTE ONLY): 22 min  Assessment / Plan / Recommendation Clinical Impression  Rn again reports that pt does well swallowing when she is observing him, but seems to cough frequently when taking sips of coffee while she is indirectly observing pt. SLP again directly observed pt take sips with no signs of aspiration, consistent with MBS finding. SLP left room and observed pt for 15 minutes while he sipped on water and took meds with RN. Again, no immediate coughing. Pt does have a baseline cough that occurs intermittently for prolonged periods independent of intake. RN feels that there are times that pt has a harder cough immediate after sips. Given finding of normal swallowing function on MBS with one instance of sensed trace aspiration with ejection (which is part of normal function) expect that pt does have moments where his timing is slightly off, but ejects aspirate with a cough. Overall, pt is following reasonable precautions and tolerating meals. He was only briefly intubated and voice is clear. He is safe to continue diet at ordered. If there were to be a negative consequence associated with potential aspiration (temp spikes, new pna) please reconsult. Otherwise, will sign off.   HPI HPI: 86 yo admitted 6/4 with SOB with respiratory failure and volume overload with CHF exacerbation. 07-Jun-2023 pt coded with bradycardia and PEA arrest with intubation 6/8-6/9. Arrest suspected to be precipitated by respiratory/possible aspiration event, as per MD note, "RT suctioned copius amounts of secretions after intubation and there are reports that his airways was congestion with material that required suctioning." PMhx: CAD s/p CABG, HTN, HLD, PAD, Lt BKA, CKD, DM      SLP Plan  Continue with current plan of care        Recommendations  Diet recommendations: Regular;Thin liquid Liquids provided via: Cup;Straw Medication Administration: Whole meds with liquid Supervision: Patient able to self feed Compensations: Slow rate;Small sips/bites Postural Changes and/or Swallow Maneuvers: Seated upright 90 degrees;Out of bed for meals                Follow up Recommendations: Inpatient Rehab SLP Visit Diagnosis: Dysphagia, unspecified (R13.10) Plan: Continue with current plan of care       GO                Altie Savard, Katherene Ponto 05/28/2021, 9:44 AM

## 2021-05-28 NOTE — Progress Notes (Signed)
Inpatient Rehab Admissions Coordinator:     I do not have insurance auth for CIR or a bed for this Pt. Today. Family meeting with palliative care team scheduled for today. I will follow up after.   Clemens Catholic, [place], [place] Admissions Coordinator  [phone] ([place]) [phone] (office)

## 2021-05-28 NOTE — Progress Notes (Signed)
Inpatient Rehab Admissions Coordinator:   Pt. Now planning to d/c home with hospice. CIR to sign off.   Clemens Catholic, [place], [place] Admissions Coordinator  [phone] ([place]) [phone] (office)

## 2021-05-28 NOTE — Discharge Summary (Addendum)
Discharge Summary    Patient ID: [name] MRN: [id number]; DOB: [birth year]  Admit date: 05/18/2021 Discharge date: 05/30/2021  PCP:  Pcp, No   [facility] Providers Cardiologist:  Sanda Klein, MD   {   Discharge Diagnoses    Principal Problem:   Acute exacerbation of CHF (congestive heart failure) ([place]) Active Problems:   Hyperlipidemia   Essential hypertension   CAD (coronary artery disease)   Type 2 diabetes mellitus with complication, with long-term current use of insulin ([place])   Acute kidney injury superimposed on chronic kidney disease ([place])   PAD (peripheral artery disease) ([place])   Bradycardic cardiac arrest ([place])   Elevated troponin   PVC (premature ventricular contraction)   Bladder obstruction   Anemia of chronic disease    Diagnostic Studies/Procedures    Echocardiogram 05/19/2021: Impressions: 1. Left ventricular ejection fraction, by estimation, is 50 to 55%. The  left ventricle has low normal function. The left ventricle demonstrates  regional wall motion abnormalities (see scoring diagram/findings for  description). There is mild left  ventricular hypertrophy. Left ventricular diastolic parameters are  indeterminate.   2. Right ventricular systolic function is moderately reduced. The right  ventricular size is moderately enlarged. There is severely elevated  pulmonary artery systolic pressure. The estimated right ventricular  systolic pressure is [id number] mmHg.   3. Left atrial size was mildly dilated.   4. The mitral valve is abnormal. Trivial mitral valve regurgitation.  Severe mitral annular calcification.   5. The aortic valve is tricuspid. There is mild calcification of the  aortic valve. Aortic valve regurgitation is not visualized. Mild to  moderate aortic valve sclerosis/calcification is present, without any  evidence of aortic stenosis. Aortic valve mean   gradient measures 5.0 mmHg.   6. The inferior vena cava is normal in size with  <50% respiratory  variability, suggesting right atrial pressure of 8 mmHg.  _______________  Lower Extremity Venous Doppler 05/20/2021: Summary:  RIGHT:  - There is no evidence of deep vein thrombosis in the lower extremity.  However, portions of this examination were limited- see technologist  comments above.  - No cystic structure found in the popliteal fossa.     LEFT:  - No evidence of common femoral vein obstruction.  _______________  Echocardiogram 05/22/2021: Impressions: 1. Left ventricular ejection fraction, by estimation, is 30 to 35%. The  left ventricle has moderately decreased function. The left ventricle  demonstrates global hypokinesis. There is mild left ventricular  hypertrophy. Left ventricular diastolic  parameters are consistent with Grade I diastolic dysfunction (impaired  relaxation). There is the interventricular septum is flattened in systole  and diastole, consistent with right ventricular pressure and volume  overload.   2. Right ventricular systolic function is normal. The right ventricular  size is mildly enlarged. There is moderately elevated pulmonary artery  systolic pressure. The estimated right ventricular systolic pressure is  123456 mmHg.   3. Left atrial size was mild to moderately dilated.   4. Right atrial size was mildly dilated.   5. The mitral valve is normal in structure. Mild mitral valve  regurgitation. No evidence of mitral stenosis. Severe mitral annular  calcification.   6. Tricuspid valve regurgitation is moderate.   7. The aortic valve is normal in structure. There is mild calcification  of the aortic valve. There is mild thickening of the aortic valve. Aortic  valve regurgitation is not visualized. Mild aortic valve sclerosis is  present, with no evidence of aortic  valve  stenosis.   8. The inferior vena cava is dilated in size with <50% respiratory  variability, suggesting right atrial pressure of 15 mmHg.   _______________  Cardiac Catheterization 05/22/2021: Prox RCA lesion is 50% stenosed. Prox RCA to Mid RCA lesion is 50% stenosed. Ost LAD to Prox LAD lesion is 99% stenosed. LIMA graft was visualized by angiography. 2nd Mrg lesion is 100% stenosed. Prox Cx to Mid Cx lesion is 40% stenosed. SVG graft was visualized by angiography. SVG graft was visualized by angiography. SVG graft was visualized by angiography. LPAV lesion is 100% stenosed. Origin to Prox Graft lesion is 100% stenosed. Prox Graft lesion is 40% stenosed.   1. 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 The LAD has a severe ostial stenosis. The mid and distal LAD fills from the patent LIMA graft to the mid LAD. The Diagonal fills from the patent vein graft. 3. The Circumflex has moderate mid stenosis. The first obtuse marginal branch is occluded. The vein graft to the Circumflex is patent with mild disease in the proximal body of the graft. The vein graft to the left sided PDA is occluded. The left sided PDA appears to be chronically occluded. 4. The small non-dominant RCA has moderate disease. 5. LVEDP 13 mmHg   Recommendations: No targets for PCI. Continue medical management of CAD.  Diagnostic Dominance: Left    _____________   History of Present Illness     [name] is a 86 y.o. male with a history of CAD s/p CABG x4 (LIMA-LAD, SVG-D1, SVG-LCx and SVG-PDA) in 2010, chronic heart failure with preserved EF, PAD disease s/p left femoropopliteal bypass in 05/2018 and now left BKA, carotid artery disease, hypertension, hyperlipidemia, type 2 diabetes mellitus, and CKD stage IV.   Patient presented to [facility] ED on 05/17/2021 for evaluation of worsening dyspnea. Patient reported shortness of breath for the past month. He is wheelchair bound at baseline due to his prior amputation but described dyspnea with transfers. Denied any associated chest pain or palpitations. Reported edema  along his right lower extremity. He has baseline 2 pillow orthopnea and was on Lasix '40mg'$  daily prior to admission.   Upon arrival to the ED, he was found to be hypoxic and O2 sats were in the 80s. HR was initially in the 40's and he was in ventricular bigeminy (suspect bradycardia was due to PVC's not being counted). EKG showed normal sinus rhythm with ventricular bigeminy but no acute ischemic changes.    Initial labs showed Na 138, K 4.6, Cr 3.24 (baseline 2.32 in 07/2020), Glucose 166, WBC 9.9, Hgb 11.6, Plts 202, Mg 2.0. Pro BNP markedly elevated at 10,389. High-sensitivity troponin elevated at 275 >> 937. Chest x-ray showed interstitial edema with no evidence of consolidation. He received a dose of IV Lasix '60mg'$  as well as Aspirin '321mg'$  and Nitro paste in the ED.   He was transferred to [facility] for further management of CHF.  Hospital Course     Consultants: Nephrology, Critical Care, EP, Palliative Care  Acute systolic and diastolic heart failure:  Pulmonary hypertension:  Patient admitted on 05/18/2021 with acute CHF as described above. Pro-BNP markedly elevated at 10,389 at outside hospital. Initial Echo on 05/19/2021 showed LVEF of 50-55%.He was initially diuresed with IV Lasix but renal function worsened with creatinine peaking to 4.18. Therefore, Nephrology was consulted to assist with further diuresis given continued volume overload. Patient suffered bradycardic arrest on morning of 05/22/2021. Repeat Echo  following this showed LVEF of 30-35%. Right atrial pressure was 15 mmHg. PICC line was placed and CO-OX was low at 59. He was placed on Levophed but this was able to be weaned off on 05/23/2021. IV diuresis was continued with the help from Nephrology and GDMT was adjusted. Beta blocker was stopped after bradycardic arrest and he was started on Hydralazine and Imdur. No ACE/ARB/ARNI or MRA due to renal function. Net negative -10.8L since admission. Given overall poor prognosis and patient  desire to never be on hemodialysis, Palliative Care was consulted and the decision was made to ultimately focus on comfort and quality and dignity of life. Patient is not being transition to hospice care upon discharge. Will continue CHF meds to prevent flash pulmonary edema/comfort: Lasix '80mg'$  BID,  hydralazine '25mg'$  TID, Imdur '30mg'$  daily.   Bradycardic Arrest On morning of 05/22/2021, patient became bradycardic to the 20s and lost a pulse. He was given 1 round of Epinephrine and brief CPR with ROSC. He was on intubated and Critical Care was consulted to help manage vent. He was on low dose Lopressor 12.'5mg'$  twice daily at the time of this arrest. He was evaluated by EP and arrest was felt to be more accurately a PEA arrest and non primarily bradycardia. He was note felt to be a candidate for PPM. He underwent left heart catheterization on 05/22/2021 which showed 3/4 patent graft and it was not felt to be a primary ischemic evaluation. High-sensitivity troponin was downtrending. He was able to be extubated on 05/23/2021. There was initially some concern that patient may of had aspirated which triggered arrest. However, he had bedside swallow study with no evidence of frank aspiration. He has long first degree AV block and continues to have frequent ventricular ectopy. AV nodal agents has been discontinued.   CAD s/p CABG Elevated Troponin/ Demand Ischemia History of CABG x4 (LIMA-LAD, SVG-D1, SVG-LCx and SVG-PDA) in 2010. High-sensitivity troponin peaked at 937 at outside hospital. Left heart cath on 05/22/2021 revealed severe native vessel disease in the LAD and OM 2 with 3/4 patent bypass grafts including the LIMA to LAD. There were no targets for PCI. Continue Aspirin '81mg'$  daily and Lipitor '40mg'$  daily. Medication may be discontinued if patient wishes at hospice.   Acute on Chronic Renal Failure: Creatinine 3.24 on admission (baseline 2.32 in 07/2020). Peaked at 4.18 after IV diuresis. Nephrology was consulted on  05/19/2021. Felt to be consistent with cardiorenal syndrome. He was not felt to be a suitable candidate for long-term dialysis given poor functional status and multiple comorbidities and patient and family confirmed that he would not want dialysis. Palliative Care was consulted and decision made to focus on comfort care. Cr 3.82 with GFR 15 at time of discharge. Discussed with nephrology, OK to transition to PO Lasix '80mg'$  BID at DC.   Acute hypoxic respiratory failure - on 4LNC oxygen currently, not on oxygen at baseline, attempted weaning at bedside today, dropping to 82% at rest, oxygen reapplied, can continue oxygen support at home for dyspnea and comfort   Presumed Aspiration Pneumonia versus HCAP  - he has been treated with Zosyn since 05/23/21, completed 7 days IV antibiotic, will discontinue antibiotic today   PVCs Frequent PVCs on telemetry with ventricular bigeminy and non-sustained. Lopressor was stopped due to bradycardic arrest.    PAD S/p left below knee amputation. He is wheelchair bound. Continue aspirin. Medication may be discontinued if patient wishes at hospice.   Hypertension Continue Lasix, hydralazine, and Imdur as  above , Medication may be discontinued if patient wishes at hospice.   Hyperlipidemia Lipid panel this admission: Total Cholesterol 123, Triglycerides 67, HDL 40, LDL 70. Discontinued Lipitor '40mg'$  daily.   Type 2 Diabetes Mellitus Hemoglobin A1c 7.7. May continue Lantus 10 unit at time of DC. Medication may be discontinued if patient wishes at hospice.   Hypothyroidism Continue home Synthroid. Medication may be discontinued if patient wishes at hospice.   Anemia of Chronic Disease Hemoglobin 11.6 on admission. Dropped as low as 8.0 on 05/26/2021. He was started on Feraheme on 05/28/2021. Hgb 8.3 on 05/28/21.    Bladder Obstruction Foley placed 05/21/2021. May continue for comfort at home.     Patient seen and examined by Dr. Johney Frame today and determined to  be stable for discharge.   Did the patient have an acute coronary syndrome (MI, NSTEMI, STEMI, etc) this admission?:  No.   The elevated Troponin was due to the acute medical illness (demand ischemia).   _____________  Discharge Vitals Blood pressure 119/69, pulse 72, temperature 97.9 F (36.6 C), temperature source Oral, resp. rate 12, height '5\' 4"'$  (1.626 m), weight 84.2 kg, SpO2 99 %.  Filed Weights   05/28/21 0500 05/29/21 0400 05/30/21 0446  Weight: 85.3 kg 84.3 kg 84.2 kg   Vitals:  Vitals:   05/29/21 2008 05/30/21 0446  BP: (!) 128/53 119/69  Pulse: 75 72  Resp: 16 12  Temp: 97.8 F (36.6 C) 97.9 F (36.6 C)  SpO2: 99% 99%   General Appearance: In no apparent distress, sitting in bed HEENT: Normocephalic, atraumatic. EOMs intact.  Neck: Supple, trachea midline, no significant JVDs Cardiovascular:Irregular rhythm, normal S1-S2,  no murmur/rub/gallop Respiratory: Resting breathing unlabored, lungs sounds clear but diminished to auscultation bilaterally, no use of accessory muscles. On 4LNC.  No wheezes, rales or rhonchi.   Gastrointestinal: Bowel sounds positive, abdomen soft, non-tender, non-distended. Extremities: Able to move all extremities in bed without difficulty, no RLE edema  Genitourinary: Foley draining clear yellow urine Musculoskeletal: Normal muscle bulk and tone, s/p remote L BKA Skin: Intact, warm, dry. No rashes or petechiae noted in exposed areas.  Neurologic: Alert, oriented to person, place and time. Fluent speech, HOH, no cognitive deficit,  no gross focal neuro deficit Psychiatric: Normal affect. Mood is appropriate.     Labs & Radiologic Studies    CBC Recent Labs    05/28/21 0425  WBC 4.7  HGB 8.3*  HCT 27.3*  MCV 103.0*  PLT 0000000   Basic Metabolic Panel Recent Labs    05/29/21 0235 05/30/21 0352  NA 135 138  K 4.5 4.3  CL 98 97*  CO2 24 30  GLUCOSE 183* 198*  BUN 91* 94*  CREATININE 3.62* 3.82*  CALCIUM 8.6* 8.8*  PHOS 5.7*  5.6*   Liver Function Tests Recent Labs    05/29/21 0235 05/30/21 0352  ALBUMIN 2.8* 2.8*   No results for input(s): LIPASE, AMYLASE in the last 72 hours. High Sensitivity Troponin:   Recent Labs  Lab 05/19/21 0610 05/22/21 0741 05/22/21 0926  TROPONINIHS 593* 155* 125*    BNP Invalid input(s): POCBNP D-Dimer No results for input(s): DDIMER in the last 72 hours. Hemoglobin A1C No results for input(s): HGBA1C in the last 72 hours. Fasting Lipid Panel No results for input(s): CHOL, HDL, LDLCALC, TRIG, CHOLHDL, LDLDIRECT in the last 72 hours. Thyroid Function Tests No results for input(s): TSH, T4TOTAL, T3FREE, THYROIDAB in the last 72 hours.  Invalid input(s): FREET3 _____________  Bea Graff  1 View  Result Date: 05/22/2021 CLINICAL DATA:  Patient status post repositioning and OG tube. EXAM: ABDOMEN - 1 VIEW COMPARISON:  None. FINDINGS: OG tube is seen with its tip and side-port in the stomach. IMPRESSION: OG tube in good position. Electronically Signed   By: Inge Rise M.D.   On: 05/22/2021 10:30   CARDIAC CATHETERIZATION  Result Date: 05/23/2021  Prox RCA lesion is 50% stenosed.  Prox RCA to Mid RCA lesion is 50% stenosed.  Ost LAD to Prox LAD lesion is 99% stenosed.  LIMA graft was visualized by angiography.  2nd Mrg lesion is 100% stenosed.  Prox Cx to Mid Cx lesion is 40% stenosed.  SVG graft was visualized by angiography.  SVG graft was visualized by angiography.  SVG graft was visualized by angiography.  LPAV lesion is 100% stenosed.  Origin to Prox Graft lesion is 100% stenosed.  Prox Graft lesion is 40% stenosed.  1. 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 The LAD has a severe ostial stenosis. The mid and distal LAD fills from the patent LIMA graft to the mid LAD. The Diagonal fills from the patent vein graft. 3. The Circumflex has moderate mid stenosis. The first obtuse marginal branch is occluded. The vein graft to the Circumflex  is patent with mild disease in the proximal body of the graft. The vein graft to the left sided PDA is occluded. The left sided PDA appears to be chronically occluded. 4. The small non-dominant RCA has moderate disease. 5. LVEDP 13 mmHg Recommendations: No targets for PCI. Continue medical management of CAD.   US RENAL  Result Date: 05/19/2021 CLINICAL DATA:  Stage IV CKD, acute renal failure. EXAM: RENAL / URINARY TRACT ULTRASOUND COMPLETE COMPARISON:  Renal ultrasound February 2019 FINDINGS: Right Kidney: Renal measurements: 11.2 x 5.2 x 4.9 cm = volume: 148 mL. Echogenicity within normal limits. No mass or hydronephrosis visualized. Left Kidney: Renal measurements: 12 x 5.7 x 4.1 cm = volume: 146 mL. Echogenicity within normal limits. No mass or hydronephrosis visualized. Bladder: Appears normal for degree of bladder distention. Other: None. IMPRESSION: No hydronephrosis. Electronically Signed   By: Dahlia Bailiff MD   On: 05/19/2021 15:51   DG CHEST PORT 1 VIEW  Result Date: 05/23/2021 CLINICAL DATA:  Endotracheal tube placement. EXAM: PORTABLE CHEST 1 VIEW COMPARISON:  May 22, 2021. FINDINGS: Stable cardiomediastinal silhouette. Status post coronary bypass graft. Endotracheal and nasogastric tubes are in grossly good position. Right-sided PICC line is noted with distal tip in expected position of right atrium. No pneumothorax is noted. Decreased bilateral lung opacities are noted suggesting improving infiltrates or edema. Bony thorax is unremarkable. IMPRESSION: Endotracheal and nasogastric tubes are in grossly good position. Right-sided PICC line is noted with distal tip in expected position of right atrium. Improving bilateral lung opacities as described above. Electronically Signed   By: Marijo Conception M.D.   On: 05/23/2021 10:35   DG CHEST PORT 1 VIEW  Result Date: 05/22/2021 CLINICAL DATA:  Intubation. EXAM: PORTABLE CHEST 1 VIEW COMPARISON:  05/19/2021. FINDINGS: Endotracheal tube noted with its  tip 2 cm above the carina. Prior CABG. Cardiomegaly. Diffuse bilateral pulmonary infiltrates/edema. Bibasilar atelectasis. Tiny left pleural effusion cannot be excluded. Elevation left hemidiaphragm. No pneumothorax. IMPRESSION: Endotracheal tube noted with its tip 2 cm above the carina anatomic position. 2.  Prior CABG.  Heart size stable. 3. Diffuse bilateral pulmonary infiltrates/edema. Bibasilar atelectasis. Tiny left pleural effusion cannot be excluded. Elevation  left hemidiaphragm. Electronically Signed   By: Marcello Moores  Register   On: 05/22/2021 08:28   DG CHEST PORT 1 VIEW  Result Date: 05/19/2021 CLINICAL DATA:  Shortness of breath EXAM: PORTABLE CHEST 1 VIEW COMPARISON:  May 17, 2021 FINDINGS: There is overall somewhat less interstitial and patchy alveolar pulmonary edema. Moderate edema remains, with the greatest degree of airspace opacity in the left base currently. There is cardiomegaly with pulmonary venous hypertension. No adenopathy. Patient is status post coronary artery bypass grafting. There is aortic atherosclerosis. No bone lesions IMPRESSION: Cardiomegaly with pulmonary vascular congestion. Pulmonary edema remains, although there has been partial clearing of pulmonary edema compared to 2 days prior. Suspect alveolar edema in the left base region, although pneumonia in this area could present in this manner. Both pneumonia and edema may be present concurrently. Status post coronary artery bypass grafting. Aortic Atherosclerosis (ICD10-I70.0). Electronically Signed   By: Lowella Grip III M.D.   On: 05/19/2021 14:04   DG Shoulder Right Port  Result Date: 05/18/2021 CLINICAL DATA:  RIGHT shoulder pain.  No injury. EXAM: PORTABLE RIGHT SHOULDER COMPARISON:  None. FINDINGS: Osseous alignment appears normal. No fracture line or displaced fracture fragment. No acute-appearing cortical irregularity or osseous lesion. No significant degenerative change. Soft tissues about the RIGHT shoulder are  unremarkable. IMPRESSION: Negative. Electronically Signed   By: Franki Cabot M.D.   On: 05/18/2021 15:50   DG Swallowing Func-Speech Pathology  Result Date: 05/24/2021 Formatting of this result is different from the original. Objective Swallowing Evaluation: Type of Study: MBS-Modified Barium Swallow Study  Patient Details Name: ISSAIH FLIPPEN MRN: [id number] Date of Birth: 07/25/1935 Today's Date: 05/24/2021 Time: SLP Start Time (ACUTE ONLY): 1215 -SLP Stop Time (ACUTE ONLY): [id number] SLP Time Calculation (min) (ACUTE ONLY): 12 min Past Medical History: Past Medical History: Diagnosis Date  Anemia   Arthritis   Carotid artery disease (HCC)   CHF (congestive heart failure) (HCC)   CKD (chronic kidney disease), stage IV ([place])   Coronary artery disease 2010  Status post coronary artery bypass grafting  Diabetes mellitus   Type II  Dyspnea   Dysrhythmia   Foot ulcer ([place])   History of blood transfusion 05/2018  Hyperlipidemia   Hypertension   Hypothyroidism   Myocardial infarction ([place]) 2010  PAD (peripheral artery disease) (H[facility])   Postoperative atrial fibrillation ([place])   Thrombocytopenia ([employer])  Past Surgical History: Past Surgical History: Procedure Laterality Date  AMPUTATION Left 06/16/2018  Procedure: LEFT TRANSMETATARSAL AMPUTATION;  Surgeon: [name], MD;  Location: [place];  Service: Orthopedics;  Laterality: Left;  AMPUTATION Left 07/16/2018  Procedure: LEFT BELOW KNEE AMPUTATION;  Surgeon: [name], MD;  Location: [place];  Service: Orthopedics;  Laterality: Left;  AORTOGRAM Left 06/03/2018  Procedure: ABDOMINAL AORTOGRAM WITH CARBON DIOXIDE LEFT LOWER EXTREMITY RUNOFF;  Surgeon: Conrad Nipomo, MD;  Location: [place];  Service: Vascular;  Laterality: Left;  BELOW KNEE LEG AMPUTATION Left 07/16/2018  carotid insuff    CORONARY ARTERY BYPASS GRAFT  2010  LIMA to LAD, SVG to diagonal, SVG to circumflex, marginal, SVG to posterior descending branch.  FEMORAL-POPLITEAL BYPASS GRAFT Left 06/10/2018  Procedure: LEFT  COMMON FEMORAL TO BELOW KNEE POPLITEAL BYPASS GRAFT WITH PROPATEN;  Surgeon: Conrad [place], MD;  Location: [place];  Service: Vascular;  Laterality: Left;  LEFT HEART CATH AND CORONARY ANGIOGRAPHY N/A 05/22/2021  Procedure: LEFT HEART CATH AND CORONARY ANGIOGRAPHY;  Surgeon: Burnell Blanks, MD;  Location: [place] [facility];  Service: Cardiovascular;  Laterality: N/A; HPI: 86 yo admitted 6/4 with SOB with respiratory failure and volume overload with CHF exacerbation. May 28, 2023 pt coded with bradycardia and PEA arrest with intubation 6/8-6/9. Arrest suspected to be precipitated by respiratory/possible aspiration event, as per MD note, "RT suctioned copius amounts of secretions after intubation and there are reports that his airways was congestion with material that required suctioning." PMhx: CAD s/p CABG, HTN, HLD, PAD, Lt BKA, CKD, DM  Subjective: denies trouble swallowing Assessment / Plan / Recommendation CHL IP CLINICAL IMPRESSIONS 05/24/2021 Clinical Impression Pt's oropharyngeal swallowing is grossly WFL, with a single instance of aspiration that occurred before the swallow due to impaired timing as the pt drank thin liquids via concsecutive straw sips. This did trigger an immediate cough response, which is how his sensory system should react to try to protect his airway, although his cough was not strong enough to eject the aspirates from his airway. No further aspiration occurred despite additional challenging including more sequential boluses, cup vs straw delivery methods, and mixed consistencies. Recommend continuing with regular solids and thin liquids with use of aspiration precautions. Would monitor closely for any additional coughing during PO intake, and SLP will f/u briefly as well. SLP Visit Diagnosis Dysphagia, unspecified (R13.10) Attention and concentration deficit following -- Frontal lobe and executive function deficit following -- Impact on safety and function Mild aspiration risk   CHL IP  TREATMENT RECOMMENDATION 05/24/2021 Treatment Recommendations Therapy as outlined in treatment plan below   Prognosis 05/24/2021 Prognosis for Safe Diet Advancement Good Barriers to Reach Goals -- Barriers/Prognosis Comment -- CHL IP DIET RECOMMENDATION 05/24/2021 SLP Diet Recommendations Regular solids;Thin liquid Liquid Administration via Cup;Straw Medication Administration Whole meds with liquid Compensations Slow rate;Small sips/bites Postural Changes Seated upright at 90 degrees   CHL IP OTHER RECOMMENDATIONS 05/24/2021 Recommended Consults -- Oral Care Recommendations Oral care BID Other Recommendations --   CHL IP FOLLOW UP RECOMMENDATIONS 05/24/2021 Follow up Recommendations Inpatient Rehab   CHL IP FREQUENCY AND DURATION 05/24/2021 Speech Therapy Frequency (ACUTE ONLY) min 2x/week Treatment Duration 1 week      CHL IP ORAL PHASE 05/24/2021 Oral Phase WFL Oral - Pudding Teaspoon -- Oral - Pudding Cup -- Oral - Honey Teaspoon -- Oral - Honey Cup -- Oral - Nectar Teaspoon -- Oral - Nectar Cup -- Oral - Nectar Straw -- Oral - Thin Teaspoon -- Oral - Thin Cup -- Oral - Thin Straw -- Oral - Puree -- Oral - Mech Soft -- Oral - Regular -- Oral - Multi-Consistency -- Oral - Pill -- Oral Phase - Comment --  CHL IP PHARYNGEAL PHASE 05/24/2021 Pharyngeal Phase Impaired Pharyngeal- Pudding Teaspoon -- Pharyngeal -- Pharyngeal- Pudding Cup -- Pharyngeal -- Pharyngeal- Honey Teaspoon -- Pharyngeal -- Pharyngeal- Honey Cup -- Pharyngeal -- Pharyngeal- Nectar Teaspoon -- Pharyngeal -- Pharyngeal- Nectar Cup -- Pharyngeal -- Pharyngeal- Nectar Straw -- Pharyngeal -- Pharyngeal- Thin Teaspoon -- Pharyngeal -- Pharyngeal- Thin Cup WFL Pharyngeal -- Pharyngeal- Thin Straw Penetration/Aspiration before swallow Pharyngeal Material enters airway, passes BELOW cords and not ejected out despite cough attempt by patient Pharyngeal- Puree WFL Pharyngeal -- Pharyngeal- Mechanical Soft -- Pharyngeal -- Pharyngeal- Regular WFL Pharyngeal --  Pharyngeal- Multi-consistency -- Pharyngeal -- Pharyngeal- Pill WFL Pharyngeal -- Pharyngeal Comment --  CHL IP CERVICAL ESOPHAGEAL PHASE 05/24/2021 Cervical Esophageal Phase WFL Pudding Teaspoon -- Pudding Cup -- Honey Teaspoon -- Honey Cup -- Nectar Teaspoon -- Nectar Cup -- Nectar Straw -- Thin Teaspoon -- Thin Cup -- Thin Straw --  Puree -- Mechanical Soft -- Regular -- Multi-consistency -- Pill -- Cervical Esophageal Comment -- Osie Bond., M.A. CCC-SLP Acute Rehabilitation Services Pager [phone] Office [phone] 05/24/2021, 1:36 PM              ECHOCARDIOGRAM COMPLETE  Result Date: 05/22/2021    ECHOCARDIOGRAM REPORT   Patient Name:   JACOBSON MILAN Date of Exam: 05/22/2021 Medical Rec #:  [id number]        Height:       64.0 in Accession #:    [id number]       Weight:       186.7 lb Date of Birth:  08/15/1935        BSA:          1.900 m Patient Age:    56 years         BP:           124/62 mmHg Patient Gender: M                HR:           92 bpm. Exam Location:  Inpatient Procedure: 2D Echo, Cardiac Doppler and Color Doppler                    STAT ECHO Reported to: Dr. Marlou Porch on 05/22/2021 9:11:00 AM. Indications:    Cardiac arrest I46.9  History:        Patient has prior history of Echocardiogram examinations, most                 recent 05/19/2021. CHF, Previous Myocardial Infarction and CAD,                 Arrythmias:Atrial Fibrillation, Signs/Symptoms:Dyspnea; Risk                 Factors:Hypertension, Dyslipidemia and Diabetes.  Sonographer:    Bernadene Person RDCS Referring Phys: [id number] [place]  1. Left ventricular ejection fraction, by estimation, is 30 to 35%. The left ventricle has moderately decreased function. The left ventricle demonstrates global hypokinesis. There is mild left ventricular hypertrophy. Left ventricular diastolic parameters are consistent with Grade I diastolic dysfunction (impaired relaxation). There is the interventricular septum is flattened in  systole and diastole, consistent with right ventricular pressure and volume overload.  2. Right ventricular systolic function is normal. The right ventricular size is mildly enlarged. There is moderately elevated pulmonary artery systolic pressure. The estimated right ventricular systolic pressure is 123456 mmHg.  3. Left atrial size was mild to moderately dilated.  4. Right atrial size was mildly dilated.  5. The mitral valve is normal in structure. Mild mitral valve regurgitation. No evidence of mitral stenosis. Severe mitral annular calcification.  6. Tricuspid valve regurgitation is moderate.  7. The aortic valve is normal in structure. There is mild calcification of the aortic valve. There is mild thickening of the aortic valve. Aortic valve regurgitation is not visualized. Mild aortic valve sclerosis is present, with no evidence of aortic valve stenosis.  8. The inferior vena cava is dilated in size with <50% respiratory variability, suggesting right atrial pressure of 15 mmHg. FINDINGS  Left Ventricle: Left ventricular ejection fraction, by estimation, is 30 to 35%. The left ventricle has moderately decreased function. The left ventricle demonstrates global hypokinesis. The left ventricular internal cavity size was normal in size. There is mild left ventricular hypertrophy. The interventricular septum is flattened in systole and diastole, consistent with right ventricular pressure  and volume overload. Left ventricular diastolic parameters are consistent with Grade I diastolic dysfunction (impaired relaxation).  LV Wall Scoring: The inferior wall is akinetic. Right Ventricle: The right ventricular size is mildly enlarged. No increase in right ventricular wall thickness. Right ventricular systolic function is normal. There is moderately elevated pulmonary artery systolic pressure. The tricuspid regurgitant velocity is 2.93 m/s, and with an assumed right atrial pressure of 15 mmHg, the estimated right ventricular  systolic pressure is 123456 mmHg. Left Atrium: Left atrial size was mild to moderately dilated. Right Atrium: Right atrial size was mildly dilated. Pericardium: There is no evidence of pericardial effusion. Mitral Valve: The mitral valve is normal in structure. Severe mitral annular calcification. Mild mitral valve regurgitation. No evidence of mitral valve stenosis. Tricuspid Valve: The tricuspid valve is normal in structure. Tricuspid valve regurgitation is moderate . No evidence of tricuspid stenosis. Aortic Valve: The aortic valve is normal in structure. There is mild calcification of the aortic valve. There is mild thickening of the aortic valve. Aortic valve regurgitation is not visualized. Mild aortic valve sclerosis is present, with no evidence of aortic valve stenosis. Pulmonic Valve: The pulmonic valve was normal in structure. Pulmonic valve regurgitation is trivial. No evidence of pulmonic stenosis. Aorta: The aortic root is normal in size and structure. Venous: The inferior vena cava is dilated in size with less than 50% respiratory variability, suggesting right atrial pressure of 15 mmHg. IAS/Shunts: No atrial level shunt detected by color flow Doppler.  LEFT VENTRICLE PLAX 2D LVIDd:         4.80 cm LVIDs:         3.30 cm LV PW:         1.20 cm LV IVS:        1.20 cm LVOT diam:     1.90 cm LV SV:         44 LV SV Index:   23 LVOT Area:     2.84 cm  RIGHT VENTRICLE TAPSE (M-mode): 1.6 cm LEFT ATRIUM             Index       RIGHT ATRIUM           Index LA diam:        3.10 cm 1.63 cm/m  RA Area:     21.10 cm LA Vol (A2C):   84.8 ml 44.63 ml/m RA Volume:   68.10 ml  35.84 ml/m LA Vol (A4C):   74.8 ml 39.37 ml/m LA Biplane Vol: 79.3 ml 41.73 ml/m  AORTIC VALVE LVOT Vmax:   94.98 cm/s LVOT Vmean:  63.250 cm/s LVOT VTI:    0.157 m  AORTA Ao Root diam: 3.30 cm Ao Asc diam:  3.30 cm TRICUSPID VALVE TR Peak grad:   34.3 mmHg TR Vmax:        293.00 cm/s  SHUNTS Systemic VTI:  0.16 m Systemic Diam: 1.90 cm  Candee Furbish MD Electronically signed by Candee Furbish MD Signature Date/Time: 05/22/2021/11:09:57 AM    Final    ECHOCARDIOGRAM COMPLETE  Result Date: 05/19/2021    ECHOCARDIOGRAM REPORT   Patient Name:   JOURNEY RODABAUGH Date of Exam: 05/19/2021 Medical Rec #:  [id number]        Height:       64.0 in Accession #:    [id number]       Weight:       198.4 lb Date of Birth:  10/30/1935  BSA:          1.950 m Patient Age:    46 years         BP:           127/57 mmHg Patient Gender: M                HR:           54 bpm. Exam Location:  Inpatient Procedure: 2D Echo, Color Doppler and Cardiac Doppler Indications:    NSTEMI i21.4  History:        Patient has prior history of Echocardiogram examinations, most                 recent 06/08/2018. CHF, Prior CABG; Risk Factors:Hypertension,                 Diabetes and Dyslipidemia.  Sonographer:    Raquel Sarna Senior RDCS Referring Phys: [id number] [place]  1. Left ventricular ejection fraction, by estimation, is 50 to 55%. The left ventricle has low normal function. The left ventricle demonstrates regional wall motion abnormalities (see scoring diagram/findings for description). There is mild left ventricular hypertrophy. Left ventricular diastolic parameters are indeterminate.  2. Right ventricular systolic function is moderately reduced. The right ventricular size is moderately enlarged. There is severely elevated pulmonary artery systolic pressure. The estimated right ventricular systolic pressure is 123XX123 mmHg.  3. Left atrial size was mildly dilated.  4. The mitral valve is abnormal. Trivial mitral valve regurgitation. Severe mitral annular calcification.  5. The aortic valve is tricuspid. There is mild calcification of the aortic valve. Aortic valve regurgitation is not visualized. Mild to moderate aortic valve sclerosis/calcification is present, without any evidence of aortic stenosis. Aortic valve mean  gradient measures 5.0 mmHg.  6. The inferior vena  cava is normal in size with <50% respiratory variability, suggesting right atrial pressure of 8 mmHg. FINDINGS  Left Ventricle: Left ventricular ejection fraction, by estimation, is 50 to 55%. The left ventricle has low normal function. The left ventricle demonstrates regional wall motion abnormalities. The left ventricular internal cavity size was normal in size. There is mild left ventricular hypertrophy. Left ventricular diastolic parameters are indeterminate.  LV Wall Scoring: The basal inferolateral segment and basal inferior segment are hypokinetic. Right Ventricle: The right ventricular size is moderately enlarged. No increase in right ventricular wall thickness. Right ventricular systolic function is moderately reduced. There is severely elevated pulmonary artery systolic pressure. The tricuspid regurgitant velocity is 3.83 m/s, and with an assumed right atrial pressure of 8 mmHg, the estimated right ventricular systolic pressure is 123XX123 mmHg. Left Atrium: Left atrial size was mildly dilated. Right Atrium: Right atrial size was normal in size. Pericardium: There is no evidence of pericardial effusion. Mitral Valve: The mitral valve is abnormal. Severe mitral annular calcification. Trivial mitral valve regurgitation. Tricuspid Valve: The tricuspid valve is grossly normal. Tricuspid valve regurgitation is mild. Aortic Valve: The aortic valve is tricuspid. There is mild calcification of the aortic valve. There is moderate aortic valve annular calcification. Aortic valve regurgitation is not visualized. Mild to moderate aortic valve sclerosis/calcification is present, without any evidence of aortic stenosis. Aortic valve mean gradient measures 5.0 mmHg. Aortic valve peak gradient measures 10.1 mmHg. Aortic valve area, by VTI measures 1.24 cm. Pulmonic Valve: The pulmonic valve was grossly normal. Pulmonic valve regurgitation is mild. Aorta: The aortic root is normal in size and structure. Venous: The inferior  vena cava is normal in size  with less than 50% respiratory variability, suggesting right atrial pressure of 8 mmHg. IAS/Shunts: No atrial level shunt detected by color flow Doppler.  LEFT VENTRICLE PLAX 2D LVIDd:         4.20 cm LVIDs:         3.70 cm LV PW:         1.10 cm LV IVS:        0.90 cm LVOT diam:     1.80 cm LV SV:         44 LV SV Index:   23 LVOT Area:     2.54 cm  RIGHT VENTRICLE RV S prime:     5.55 cm/s TAPSE (M-mode): 1.2 cm LEFT ATRIUM             Index       RIGHT ATRIUM           Index LA diam:        3.80 cm 1.95 cm/m  RA Area:     19.10 cm LA Vol (A2C):   80.0 ml 41.03 ml/m RA Volume:   55.80 ml  28.62 ml/m LA Vol (A4C):   53.7 ml 27.54 ml/m LA Biplane Vol: 67.9 ml 34.82 ml/m  AORTIC VALVE AV Area (Vmax):    1.17 cm AV Area (Vmean):   1.26 cm AV Area (VTI):     1.24 cm AV Vmax:           159.00 cm/s AV Vmean:          109.000 cm/s AV VTI:            0.356 m AV Peak Grad:      10.1 mmHg AV Mean Grad:      5.0 mmHg LVOT Vmax:         73.40 cm/s LVOT Vmean:        54.000 cm/s LVOT VTI:          0.173 m LVOT/AV VTI ratio: 0.49  AORTA Ao Asc diam: 3.20 cm TRICUSPID VALVE TR Peak grad:   58.7 mmHg TR Vmax:        383.00 cm/s  SHUNTS Systemic VTI:  0.17 m Systemic Diam: 1.80 cm Rozann Lesches MD Electronically signed by Rozann Lesches MD Signature Date/Time: 05/19/2021/12:57:31 PM    Final    VAS [place] LOWER EXTREMITY VENOUS (DVT)  Result Date: 05/20/2021  Lower Venous DVT Study Patient Name:  INMAN KOSIN  Date of Exam:   05/20/2021 Medical Rec #: [id number]         Accession #:    [id number] Date of Birth: Jun 08, 1935         Patient Gender: M Patient Age:   43Y Exam Location:  [facility] Procedure:      VAS [place] LOWER EXTREMITY VENOUS (DVT) Referring Phys: [id number] TIFFANY Elcho --------------------------------------------------------------------------------  Indications: Swelling.  Anticoagulation: Heparin. Limitations: Poor ultrasound/tissue interface. Comparison Study: No  prior studies. Extensive arterial history. Performing Technologist: Darlin Coco RDMS,RVT  Examination Guidelines: A complete evaluation includes B-mode imaging, spectral Doppler, color Doppler, and power Doppler as needed of all accessible portions of each vessel. Bilateral testing is considered an integral part of a complete examination. Limited examinations for reoccurring indications may be performed as noted. The reflux portion of the exam is performed with the patient in reverse Trendelenburg.  +---------+---------------+---------+-----------+----------+--------------+ RIGHT    CompressibilityPhasicitySpontaneityPropertiesThrombus Aging +---------+---------------+---------+-----------+----------+--------------+ CFV      Full           Yes  Yes                                 +---------+---------------+---------+-----------+----------+--------------+ SFJ      Full                                                        +---------+---------------+---------+-----------+----------+--------------+ FV Prox  Full                                                        +---------+---------------+---------+-----------+----------+--------------+ FV Mid   Full                                                        +---------+---------------+---------+-----------+----------+--------------+ FV DistalFull                                                        +---------+---------------+---------+-----------+----------+--------------+ PFV      Full                                                        +---------+---------------+---------+-----------+----------+--------------+ POP      Full           Yes      Yes                                 +---------+---------------+---------+-----------+----------+--------------+ PTV      Full                                                        +---------+---------------+---------+-----------+----------+--------------+  PERO                    Yes      Yes                                 +---------+---------------+---------+-----------+----------+--------------+   +----+---------------+---------+-----------+----------+--------------+ LEFTCompressibilityPhasicitySpontaneityPropertiesThrombus Aging +----+---------------+---------+-----------+----------+--------------+ CFV Full           Yes      Yes                                 +----+---------------+---------+-----------+----------+--------------+    Summary: RIGHT: - There is no evidence of deep vein thrombosis in the lower extremity. However, portions of this  examination were limited- see technologist comments above.  - No cystic structure found in the popliteal fossa.  LEFT: - No evidence of common femoral vein obstruction.  *See table(s) above for measurements and observations. Electronically signed by Jamelle Haring on 05/20/2021 at 2:54:23 PM.    Final    [place] EKG SITE RITE  Result Date: 05/22/2021 If Site Rite image not attached, placement could not be confirmed due to current cardiac rhythm.  Disposition   Patient states he is feeling well, denied any chest pain or SOB. He is eager to go home. He is tolerating PO intake well. Patient is being discharged home today with home hospice. Attempted calling family (Son, daughter, and granddaughter, no answer).   Follow-up Plans & Appointments     Follow-up Information     Neale Burly, MD Follow up on 06/03/2021.   Specialty: Internal Medicine Why: 1:15 for hospital follow up Contact information: [place] [phone]                Discharge Instructions     Diet - low sodium heart healthy   Complete by: As directed    Discharge wound care:   Complete by: As directed    Please ensure turn and position q2h, keep sacrum clean and dry   Increase activity slowly   Complete by: As directed        Discharge Medications   Allergies as of  05/30/2021   No Known Allergies      Medication List     STOP taking these medications    amLODipine 5 MG tablet Commonly known as: NORVASC   atorvastatin 40 MG tablet Commonly known as: LIPITOR       TAKE these medications    acetaminophen 325 MG tablet Commonly known as: TYLENOL Take 2 tablets (650 mg total) by mouth every 4 (four) hours as needed for headache or mild pain.   aspirin 81 MG tablet Take 81 mg by mouth at bedtime.   docusate sodium 100 MG capsule Commonly known as: COLACE Take 1 capsule (100 mg total) by mouth 2 (two) times daily.   furosemide 40 MG tablet Commonly known as: Lasix Take 2 tablets (80 mg total) by mouth 2 (two) times daily. What changed:  how much to take when to take this reasons to take this   gabapentin 600 MG tablet Commonly known as: NEURONTIN Take 0.5 tablets (300 mg total) by mouth daily.   hydrALAZINE 25 MG tablet Commonly known as: APRESOLINE Take 1 tablet (25 mg total) by mouth 3 (three) times daily.   insulin glargine 100 UNIT/ML injection Commonly known as: LANTUS Inject 0.1 mLs (10 Units total) into the skin at bedtime. What changed:  how much to take when to take this additional instructions   isosorbide mononitrate 30 MG 24 hr tablet Commonly known as: IMDUR Take 1 tablet (30 mg total) by mouth daily.   levothyroxine 50 MCG tablet Commonly known as: SYNTHROID Take 50 mcg by mouth daily before breakfast.   nitroGLYCERIN 0.4 MG SL tablet Commonly known as: NITROSTAT Place 1 tablet (0.4 mg total) under the tongue every 5 (five) minutes x 3 doses as needed for chest pain.   omega-3 fish oil 1000 MG Caps capsule Commonly known as: MAXEPA Take 1 capsule by mouth 3 (three) times daily.   pantoprazole 40 MG tablet Commonly known as: PROTONIX Take 40 mg by mouth daily.   potassium chloride 10 MEQ tablet  Commonly known as: KLOR-CON Take 10 mEq by mouth daily.       ASK your doctor about these  medications    traZODone 50 MG tablet Commonly known as: DESYREL Take 1 tablet (50 mg total) by mouth at bedtime as needed for sleep.               Discharge Care Instructions  (From admission, onward)           Start     Ordered   05/30/21 0000  Discharge wound care:       Comments: Please ensure turn and position q2h, keep sacrum clean and dry   05/30/21 0944               Outstanding Labs/Studies   N/A   Duration of Discharge Encounter   Greater than 30 minutes including physician time.  Signed, Margie Billet, NP 05/30/2021, 9:45 AM  Patient seen and examined and agree with Margie Billet, NP as detailed above.  Plan to discharge to home hospice today. Patient doing well and excited to go home.   Exam: GENERAL:  Chronically ill appearing, comfortable HEENT: Normal, PERRL NECK:  No carotid bruits LUNGS: Diminished but clear HEART:  RRR, no murmurs ABD:  Soft, ND, NTTP EXT:  L BKA. Warm. Trace RLE edema NEURO: Alert and awake this morning; confused but pleasant PSYCH:  Normal affect   Plan: -Discharge to home hospice today -Transition to lasix '80mg'$  PO BID -Completed course of ABX; no need to continue on discharge -No nodal agents given bradycardic arrest -Continue imdur/hydralazine for underlying HF; can stop as needed while on hospice -Continue ASA '81mg'$  daily for now; can stop at home if needed -Stop statin  Gwyndolyn Kaufman, MD

## 2021-05-28 NOTE — Progress Notes (Signed)
Patient ID: [name], male   DOB: [birth year], 86 y.o.   MRN: [id number]     Progress Note from the Palliative Medicine Team at [facility]   Patient Name: [name]        Date: 05/28/2021 DOB: 07/01/1935  Age: 86 y.o. MRN#: [id number] Attending Physician: Sanda Klein, MD Primary Care Physician: Pcp, No Admit Date: 05/18/2021   Medical records reviewed   86 y.o. male   admitted on 05/18/2021 with past medical history of CAD (s/p CABG in 2010 with LIMA-LAD, SVG-D1, SVG-LCx and SVG-PDA), HFpEF, peripheral arterial disease (s/p left femoropopliteal bypass in 05/2018 and now left BKA), HTN, HLD, Type 2 DM, carotid artery disease, and Stage 4 CKD He is wheelchair bound at baseline 2/2 to amputation.   Reported increasing shortness of breath with minimal exertion.  Reported left lower extremity edema and orthopnea/ 2 pillow at baseline   Upon arrival to the ED, he was found to be hypoxic and oxygen saturations were in the 80's upon arrival. HR was initially in the 40's and he was in ventricular bigeminy (suspect bradycardia was due to PVC's not being counted). EKG showed no ischemic changes per the report.    Admitted for treatment stabilization..  Currently cardiac ICU.  Worsening renal function, poor HD candidate.  Today is day 9 of this hospitalization.  This NP visited patient at the bedside as a follow up to  yesterday's [place] and to meet with son/Wayne and daughter/Kathy for continued conversation regarding current medical situation.  Continued education regarding current medical situation; diagnosis, prognosis, treatment options and likely trajectory of this patient's overall medical condition.  The difference between aggressive medical intervention path and a palliative comfort path for this patient at this time in this situation was detailed  Family understand the seriousness of the current medical situation and a desire comfort and dignity for Mr. Fagerstrom at this  time in his life Focus of care is comfort, quality and dignity  Plan of care -DNR/DNI- BiPap if tolerated until discharge home -No artificial feeding now or in the future -No dialysis now or in the future -foley cath for EOL care -symptom management     - family is requesting that no opioids be left in the home if patient needs opioids for symptom management son or daughter will administer -Continue current interventions until discharge home with hospice in the morning- family requesting [facility] form completed  Questions and concerns addressed   Discussed with Dr Johney Frame and bedside RN and Christena Flake  Total time spent on the unit was 60 minutes  Greater than 50% of the time was spent in counseling and coordination of care  Wadie Lessen NP  Palliative Medicine Team Team Phone # [phone] Pager [phone]

## 2021-05-28 NOTE — Progress Notes (Signed)
[facility] Progress Note   Assessment/ Plan:   Bradycardic arrest - not due to electrolyte abnormalities and likely primary cardiac in nature.   LHC revealed 3/4 patent bypass grafts, no targets for intervention EP evaluated and no pacemaker needed  AKI/CKD stage IV - in setting of acute on chronic diastolic CHF and escalating diuretics consistent with cardiorenal syndrome.  His Scr increased from 3.89 to 4.18 after UOP of 3.9 liters with lasix 120 mg IV tid.  It was decreased to bid on 05/20/21 but still with 3.5 liters of UOP. Continue Lasix 80 mg IV and cardiac cath--> continue for now, Cr 3.7-3.9  He is not a suitable candidate for long-term dialysis given his poor functional status and multiple comorbidities. He and his family have confirmed no dialysis.  Acute on chronic diastolic CHF- Lasix 80 mg IV daily.    Anemia of CKD stage IV - follow H/H, iron stores.  On Aranesp 40 mcg Friday HTN - stable IDDM - per primary AMS - improved HCAP: on Zosyn Disposition - palliative care has been consulted, appreciate assistance.  Cr is stable on IV Lasix and pt is diuresing well.  Nothing further to add.  Will sign off.  He will require OP followup--> please contact our office upon discharge [phone] to arrange.    Subjective:    No issues overnight.  Net neg 1.6L.  Palliative care consulted- appreciate assistance.     Objective:   BP 128/71   Pulse 87   Temp 98.6 F (37 C) (Oral)   Resp 17   Ht _0  (1.626 m)   Wt 85.3 kg   SpO2 98%   BMI 32.28 kg/m   Physical Exam: Gen: NAD, sitting in chair NECK: + JVD upright, improving CVS: RRR Resp: bibasilar crackles Abd: soft, nontender NABS Ext: 1+ LE edema, improving  Labs: BMET Recent Labs  Lab 05/22/21 0026 05/22/21 0741 05/23/21 0325 05/23/21 2253 05/24/21 0429 05/25/21 0413 05/26/21 0526 05/26/21 1023 05/27/21 0452 05/28/21 0425  NA 132*   < > 133* 135 134* 134* 138 137 138 138  K 4.8   < > 3.5  4.7 4.7 4.7 3.9 4.2 3.9 3.9  CL 97*   < > 98 98 100 96* 98  --  100 98  CO2 21*   < > _1 --  28 29  GLUCOSE 226*   < > 241* 168* 115* 113* 96  --  124* 109*  BUN 85*   < > 83* 81* 82* 85* 86*  --  86* 86*  CREATININE 3.81*   < > 3.45* 3.64* 3.75* 3.97* 3.78*  --  3.90* 3.67*  CALCIUM 8.2*   < > 8.0* 8.3* 8.6* 8.7* 8.7*  --  8.5* 8.6*  PHOS 6.7*  --  3.1  --  6.1* 7.1* 6.2*  --  6.2* 5.8*   < > = values in this interval not displayed.   CBC Recent Labs  Lab 05/25/21 0413 05/26/21 0526 05/26/21 1023 05/27/21 0452 05/28/21 0425  WBC 8.1 4.6  --  4.9 4.7  HGB 8.5* 8.0* 8.8* 8.5* 8.3*  HCT 27.5* 26.2* 26.0* 27.7* 27.3*  MCV 102.2* 102.7*  --  103.0* 103.0*  PLT 133* 139*  --  147* 160      Medications:     aspirin  81 mg Oral Daily   atorvastatin  40 mg Oral QHS   chlorhexidine gluconate (MEDLINE KIT)  15 mL Mouth Rinse  BID   Chlorhexidine Gluconate Cloth  6 each Topical Daily   darbepoetin (ARANESP) injection - NON-DIALYSIS  40 mcg Subcutaneous Q Fri-1800   docusate sodium  100 mg Oral BID   furosemide  80 mg Intravenous Daily   gabapentin  300 mg Oral Daily   heparin injection (subcutaneous)  5,000 Units Subcutaneous Q8H   hydrALAZINE  25 mg Oral Q8H   insulin aspart  0-15 Units Subcutaneous Q4H   insulin detemir  10 Units Subcutaneous BID   ipratropium-albuterol  3 mL Nebulization TID   isosorbide mononitrate  30 mg Oral Daily   levothyroxine  50 mcg Oral QAC breakfast   mouth rinse  15 mL Mouth Rinse BID   pantoprazole  40 mg Oral Daily   polyethylene glycol  17 g Oral Daily   sodium chloride flush  10-40 mL Intracatheter Q12H   sodium chloride flush  3 mL Intravenous Q12H     Madelon Lips MD 05/28/2021, 9:56 AM

## 2021-05-29 DIAGNOSIS — I10 Essential (primary) hypertension: Secondary | ICD-10-CM

## 2021-05-29 LAB — GLUCOSE, CAPILLARY
Glucose-Capillary: 222 mg/dL — ABNORMAL HIGH (ref 70–99)
Glucose-Capillary: 181 mg/dL — ABNORMAL HIGH (ref 70–99)
Glucose-Capillary: 155 mg/dL — ABNORMAL HIGH (ref 70–99)
Glucose-Capillary: 142 mg/dL — ABNORMAL HIGH (ref 70–99)
Glucose-Capillary: 216 mg/dL — ABNORMAL HIGH (ref 70–99)
Glucose-Capillary: 219 mg/dL — ABNORMAL HIGH (ref 70–99)

## 2021-05-29 LAB — RENAL FUNCTION PANEL
Albumin: 2.8 g/dL — ABNORMAL LOW (ref 3.5–5.0)
Anion gap: 13 (ref 5–15)
BUN: 91 mg/dL — ABNORMAL HIGH (ref 8–23)
CO2: 24 mmol/L (ref 22–32)
Calcium: 8.6 mg/dL — ABNORMAL LOW (ref 8.9–10.3)
Chloride: 98 mmol/L (ref 98–111)
Creatinine, Ser: 3.62 mg/dL — ABNORMAL HIGH (ref 0.61–1.24)
GFR, Estimated: 16 mL/min — ABNORMAL LOW (ref >60)
Glucose, Bld: 183 mg/dL — ABNORMAL HIGH (ref 70–99)
Phosphorus: 5.7 mg/dL — ABNORMAL HIGH (ref 2.5–4.6)
Potassium: 4.5 mmol/L (ref 3.5–5.1)
Sodium: 135 mmol/L (ref 135–145)

## 2021-05-29 MED ORDER — INSULIN ASPART 100 UNIT/ML IJ SOLN
0.0000 [IU] | Freq: Three times a day (TID) | INTRAMUSCULAR | Status: DC
  Administered 2021-05-30: 3 [IU] via SUBCUTANEOUS

## 2021-05-29 MED ORDER — INSULIN ASPART 100 UNIT/ML IJ SOLN: 0.0000 [IU] | Freq: Three times a day (TID) | INTRAMUSCULAR | Status: DC

## 2021-05-29 NOTE — Progress Notes (Addendum)
 Progress Note  Patient Name: [name] Date of Encounter: 05/29/2021  [facility] Cardiologist: Mihai Croitoru, MD   Subjective   Patient is sitting in bed, in good spirit. He denied any chest pain. He states he is going home and is happy about that, he confirmed that he is not going to accept dialysis and wishes his care to be focused on comfort. He is urinating well via Foley. Attempted wean off oxygen, pox dropping to 82% on room air, will continue 4LNC.   Inpatient Medications    Scheduled Meds:  aspirin  81 mg Oral Daily   chlorhexidine gluconate (MEDLINE KIT)  15 mL Mouth Rinse BID   Chlorhexidine Gluconate Cloth  6 each Topical Daily   darbepoetin (ARANESP) injection - NON-DIALYSIS  40 mcg Subcutaneous Q Fri-1800   docusate sodium  100 mg Oral BID   furosemide  80 mg Intravenous Daily   gabapentin  300 mg Oral Daily   heparin injection (subcutaneous)  5,000 Units Subcutaneous Q8H   hydrALAZINE  25 mg Oral Q8H   insulin aspart  0-15 Units Subcutaneous Q4H   insulin detemir  10 Units Subcutaneous BID   isosorbide mononitrate  30 mg Oral Daily   levothyroxine  50 mcg Oral QAC breakfast   mouth rinse  15 mL Mouth Rinse BID   pantoprazole  40 mg Oral Daily   sodium chloride flush  10-40 mL Intracatheter Q12H   sodium chloride flush  3 mL Intravenous Q12H   Continuous Infusions:  sodium chloride 10 mL/hr at 05/28/21 1500   sodium chloride     ferumoxytol 510 mg (05/28/21 1501)   piperacillin-tazobactam (ZOSYN)  IV 2.25 g (05/29/21 0650)   PRN Meds: sodium chloride, acetaminophen, ipratropium-albuterol, naphazoline-pheniramine, nitroGLYCERIN, ondansetron (ZOFRAN) IV, oxyCODONE, sodium chloride flush, sodium chloride flush, traZODone   Vital Signs    Vitals:   05/28/21 1600 05/28/21 1947 05/29/21 0400 05/29/21 0700  BP:  121/62 125/85 (!) 133/57  Pulse:  72 70 73  Resp:  16 16 17  Temp: (!) 97.4 F (36.3 C) 97.8 F (36.6 C)    TempSrc: Oral Oral     SpO2:  100% 100% 100%  Weight:   84.3 kg   Height:        Intake/Output Summary (Last 24 hours) at 05/29/2021 1036 Last data filed at 05/29/2021 0449 Gross per 24 hour  Intake 1774.09 ml  Output 881 ml  Net 893.09 ml   Last 3 Weights 05/29/2021 05/28/2021 05/27/2021  Weight (lbs) 185 lb 13.6 oz 188 lb 0.8 oz 163 lb 9.3 oz  Weight (kg) 84.3 kg 85.3 kg 74.2 kg      Telemetry    Sinus rhythm, PVCs, NSVT remains - Personally Reviewed  ECG    No new tracing- Personally Reviewed  Physical Exam   GEN: No acute distress. Sitting in bed.   Neck: Trachea midline  Cardiac: RRR, no murmurs, rubs, or gallops.  Respiratory: Clear but diminished at base auscultation bilaterally. On 4LNC  GI: Soft, nontender, non-distended  MS: No RLE edema; No deformity. S/P L BKA Neuro:  Nonfocal  Psych: Normal affect  Foley draining clear yellow urine   Labs    High Sensitivity Troponin:   Recent Labs  Lab 05/19/21 0610 05/22/21 0741 05/22/21 0926  TROPONINIHS 593* 155* 125*      Chemistry Recent Labs  Lab 05/27/21 0452 05/28/21 0425 05/29/21 0235  NA 138 138 135  K 3.9 3.9 4.5  CL 100 98   98  CO2 _0 GLUCOSE 124* 109* 183*  BUN 86* 86* 91*  CREATININE 3.90* 3.67* 3.62*  CALCIUM 8.5* 8.6* 8.6*  ALBUMIN 2.8* 2.9* 2.8*  GFRNONAA 14* 16* 16*  ANIONGAP _1 Hematology Recent Labs  Lab 05/26/21 0526 05/26/21 1023 05/27/21 0452 05/28/21 0425  WBC 4.6  --  4.9 4.7  RBC 2.55*  --  2.69* 2.65*  HGB 8.0* 8.8* 8.5* 8.3*  HCT 26.2* 26.0* 27.7* 27.3*  MCV 102.7*  --  103.0* 103.0*  MCH 31.4  --  31.6 31.3  MCHC 30.5  --  30.7 30.4  RDW 13.2  --  13.1 13.1  PLT 139*  --  147* 160    BNPNo results for input(s): BNP, PROBNP in the last 168 hours.   DDimer No results for input(s): DDIMER in the last 168 hours.   Radiology    No results found.  Cardiac Studies   ECHO 05/22/21: IMPRESSIONS   1. Left ventricular ejection fraction, by estimation, is 30 to 35%.  The  left ventricle has moderately decreased function. The left ventricle  demonstrates global hypokinesis. There is mild left ventricular  hypertrophy. Left ventricular diastolic  parameters are consistent with Grade I diastolic dysfunction (impaired  relaxation). There is the interventricular septum is flattened in systole  and diastole, consistent with right ventricular pressure and volume  overload.   2. Right ventricular systolic function is normal. The right ventricular  size is mildly enlarged. There is moderately elevated pulmonary artery  systolic pressure. The estimated right ventricular systolic pressure is  31.5 mmHg.   3. Left atrial size was mild to moderately dilated.   4. Right atrial size was mildly dilated.   5. The mitral valve is normal in structure. Mild mitral valve  regurgitation. No evidence of mitral stenosis. Severe mitral annular  calcification.   6. Tricuspid valve regurgitation is moderate.   7. The aortic valve is normal in structure. There is mild calcification  of the aortic valve. There is mild thickening of the aortic valve. Aortic  valve regurgitation is not visualized. Mild aortic valve sclerosis is  present, with no evidence of aortic  valve stenosis.   8. The inferior vena cava is dilated in size with <50% respiratory  variability, suggesting right atrial pressure of 15 mmHg.   ECHO 05/19/2021   1. Left ventricular ejection fraction, by estimation, is 50 to 55%. The  left ventricle has low normal function. The left ventricle demonstrates  regional wall motion abnormalities (see scoring diagram/findings for  description). There is mild left  ventricular hypertrophy. Left ventricular diastolic parameters are  indeterminate.   2. Right ventricular systolic function is moderately reduced. The right  ventricular size is moderately enlarged. There is severely elevated  pulmonary artery systolic pressure. The estimated right ventricular  systolic  pressure is 17.6 mmHg.   3. Left atrial size was mildly dilated.   4. The mitral valve is abnormal. Trivial mitral valve regurgitation.  Severe mitral annular calcification.   5. The aortic valve is tricuspid. There is mild calcification of the  aortic valve. Aortic valve regurgitation is not visualized. Mild to  moderate aortic valve sclerosis/calcification is present, without any  evidence of aortic stenosis. Aortic valve mean   gradient measures 5.0 mmHg.   6. The inferior vena cava is normal in size with <50% respiratory  variability, suggesting right atrial pressure of 8 mmHg.   Echocardiogram: 05/2018 Study Conclusions   -  Left ventricle: The cavity size was normal. Wall thickness was    increased in a pattern of mild LVH. Systolic function was normal.    The estimated ejection fraction was in the range of 55% to 60%.    Wall motion was normal; there were no regional wall motion    abnormalities. Features are consistent with a pseudonormal left    ventricular filling pattern, with concomitant abnormal relaxation    and increased filling pressure (grade 2 diastolic dysfunction).  - Aortic valve: Mildly calcified annulus. Mildly thickened, mildly    calcified leaflets.  - Mitral valve: Mildly calcified annulus. Moderately thickened,    moderately calcified leaflets .     Patient Profile     86 y.o. male with CAD status post CABG, PAD status post left-pop bypass 05/2017 and left BKA, hypertension, hyperlipidemia, diabetes, carotid stenosis, and stage IV CKD admitted with hypoxic respiratory failure, volume overload, and elevated troponin.  Admitted to [place] after a cardiac arrest on 6/8 precipitated by bradycardia. Now extubated. Given overall poor prognosis, family meeting held with palliative and patient is transitioning to home hospice.  Assessment & Plan    #Goals of Care: Patient with overall poor prognosis with limited therapeutic options. Did not want to progress to HD and  given recent brady arrest and significant renal dysfunction, heart failure management limited. Meeting held with Palliative care, the patient and his family. Decision was made to transition to home hospice at discharge. - Appreciate palliative care recommendations - discussed with RN CM and SW today, home supply pending deliver, will plan for DC home tomorrow, chroninc medication will be continued for CHF, DM , hypothyroidism, pain etc,  as patient wishes and able to, may DC at home with hospice if patient wishes /further deteriorate   # Brady-arrest: Patient became bradycardic to the 20s and lost his pulse.  He was on metoprolol 12.46m bid at the time. He had epinephrine and brief CPR with ROSC.  He was evaluated by EP and not thought to be a candidate for PPM.  They felt it was PEA and not primarily bradycardia. He underwent left heart cath which showed 3 out of 4 of his grafts were patent and it was not thought to be primarily an ischemic event.  HS troponin was downtrending.   He has a long first degree AV block and very frequent ventricular ectopy.  Would continue to avoid nodal agents. He was evaluated with bedside swallow and there was no frank aspiration.   -Holding nodal agents -Transitioning to home hospice at discharge    # Acute systolic and diastolic heart failure:  # Pulmonary hypertension:  Left ventricular systolic function was relatively preserved on echo 05/19/2021.  On repeat 05/22/2021 LVEF was reduced to 30 to 35%.  Right atrial pressure was 15 mmHg.  Co-ox was low at 59.  Initially on levophed now weaned off. No beta blocker as above.  No ACE-I/ARB given his renal dysfunction.  No targets for PCI on cath 05/22/21 and 3/4 grafts were patent. -Diuretics per nephrology; responding well to lasix 824mIV daily, discussed with nephrology today, will transition to PO Lasix 8040mID tomorrow at discharge  -No BB due to brady arrest as above -No ACE/ARB/ARNI due to renal dysfunction -Continue  hydralazine 70m31mh -Continue imdur 30mg28mly -Monitor I/Os and daily weights -Home with hospice tomorrow    # Acute on chronic renal failure: # chronic anemia  Appreciate nephrology guiding his diuresis.  Does not want to  proceed with HD. Received darbepoetn Alfa.  -Will transition to PO diuretic on discharge, reviewed with nephrology, recommended Lasix 80mg BID at DC    # CAD s/p CABG:  # Elevated troponin: # Hyperlipidemia: CABG in 2010 with LIMA-LAD, SVG-D1, SVG-LCx and SVG-PDA.  High-sensitivity troponin was elevated to 937 at outside hospital.  Now downtrending.  Left heart cath revealed severe native vessel disease in the LAD and OM 2.  3 out of 4 bypass grafts were patent including the LIMA to the LAD.  There were no targets for PCI. -Transitioning to home hospice  -will stop statin    # PVCs: Frequent PVCs on telemetry with ventricular bigeminy and NSVT.   Hold metoprolol 2/2 bradycardia and brady arrest. -Off BB due to brady arrest as above   # PAD: S/p L BKA.  Wheelchair bound. -Continue ASA 81mg daily --Stop statin   # Acute hypoxic respiratory failure - on 4LNC, unable to wean off, will DC home with oxygen, CM made aware   #HCAP: Planned for 7 day course of ABX. On zosyn since 05/23/21, today is day 7 IV ATB, will stop ATB after today   #Urinary retention  Foley placed 6/7. UOP good. Will miantain Foley at DC for comfort.   # DM - on levemir 10 and insulin SSI, continue   # Hypothyroidism  - on levothyroxine      For questions or updates, please contact [facility] Please consult [email or web address] for contact info under        Signed, Xika Zhao, NP  05/29/2021, 10:36 AM     Patient seen and examined and agree with Xika Zhao, NP as detailed above.  In brief, the patient is a 86 y.o. male with CAD status post CABG, PAD status post left-pop bypass 05/2017 and left BKA, hypertension, hyperlipidemia, diabetes, carotid stenosis, and stage IV CKD admitted  with hypoxic respiratory failure, volume overload, and elevated troponin.  Admitted to 2H after a cardiac arrest on 6/8 precipitated by bradycardia. Now extubated. Given overall poor prognosis, family meeting held with palliative and patient is transitioning to home hospice.  Transferred out of ICU yesterday. Wants to go home with hospice. Currently awaiting for hospice to be set up. Likely discharge tomorrow. Will continue current level of care while in-patient.  Exam: GENERAL:  Comfortable, chronically ill appearing HEENT: Normal, PERRL NECK:  No carotid bruits LUNGS: Diminished at the bases; scattered wheezing HEART:  RRR, no murmurs ABD:  Soft, ND, NTTP EXT:  L BKA. Warm. Trace RLE edema SKIN:  No rashes no nodules NEURO: Alert and interactive but remains confused PSYCH:  Normal affect  Plan: -Plan to transition to home hospice on discharge; continuing current level of care while in-patient -Likely discharge tomorrow once home hospice is set-up -Continue lasix 80mg IV daily for now; likely transition to PO torsemide on discharge -Will need 1 more day of ABX; can transition to amoxicillin 500mg BID on discharge with last dose the evening of 6/16 for HCAP -No nodal agents given bradycardic arrest -Continue imdur/hydralazine for underlying HF -Continue ASA 81mg daily for now; can stop on discharge -Can stop statin on discharge given limited utility and patient transitioned to hospice -Palliative following and appreciate recommendations  Heather Pemberton, MD 

## 2021-05-29 NOTE — TOC Progression Note (Signed)
Transition of Care [facility]) - Progression Note    Patient Details  Name: NITESH KRETZER MRN: [id number] Date of Birth: 1935/03/17  [facility]) CM/SW Contact  Zenon Mayo, RN Phone Number: 05/29/2021, 10:23 AM  Clinical Narrative:    Patient transferred to [place], [place] received referral for home hospice, family would like [facility] [place] per Sharolyn Douglas.  NCM made referral to Cassandra with [facility].  Vito Backers will contact son, [name] to go over the DME needed.  Patient will need ambulance transport home at dc.        Expected Discharge Plan and Services                                                 Social Determinants of Health (SDOH) Interventions    Readmission Risk Interventions No flowsheet data found.

## 2021-05-30 DIAGNOSIS — E782 Mixed hyperlipidemia: Secondary | ICD-10-CM

## 2021-05-30 LAB — RENAL FUNCTION PANEL
Albumin: 2.8 g/dL — ABNORMAL LOW (ref 3.5–5.0)
Anion gap: 11 (ref 5–15)
BUN: 94 mg/dL — ABNORMAL HIGH (ref 8–23)
CO2: 30 mmol/L (ref 22–32)
Calcium: 8.8 mg/dL — ABNORMAL LOW (ref 8.9–10.3)
Chloride: 97 mmol/L — ABNORMAL LOW (ref 98–111)
Creatinine, Ser: 3.82 mg/dL — ABNORMAL HIGH (ref 0.61–1.24)
GFR, Estimated: 15 mL/min — ABNORMAL LOW (ref >60)
Glucose, Bld: 198 mg/dL — ABNORMAL HIGH (ref 70–99)
Phosphorus: 5.6 mg/dL — ABNORMAL HIGH (ref 2.5–4.6)
Potassium: 4.3 mmol/L (ref 3.5–5.1)
Sodium: 138 mmol/L (ref 135–145)

## 2021-05-30 LAB — GLUCOSE, CAPILLARY: Glucose-Capillary: 200 mg/dL — ABNORMAL HIGH (ref 70–99)

## 2021-05-30 MED ORDER — HYDRALAZINE HCL 25 MG PO TABS: 25.0000 mg | ORAL_TABLET | Freq: Three times a day (TID) | ORAL | 2 refills | Status: AC

## 2021-05-30 MED ORDER — ISOSORBIDE MONONITRATE ER 30 MG PO TB24: 30.0000 mg | ORAL_TABLET | Freq: Every day | ORAL | 2 refills | Status: AC

## 2021-05-30 MED ORDER — DOCUSATE SODIUM 100 MG PO CAPS: 100.0000 mg | ORAL_CAPSULE | Freq: Two times a day (BID) | ORAL | 0 refills | Status: AC

## 2021-05-30 MED ORDER — ACETAMINOPHEN 325 MG PO TABS: 650.0000 mg | ORAL_TABLET | ORAL | Status: AC | PRN

## 2021-05-30 MED ORDER — FUROSEMIDE 40 MG PO TABS: 80.0000 mg | ORAL_TABLET | Freq: Two times a day (BID) | ORAL | 1 refills | Status: AC

## 2021-05-30 MED ORDER — GABAPENTIN 600 MG PO TABS: 300.0000 mg | ORAL_TABLET | Freq: Every day | ORAL | 1 refills | Status: AC

## 2021-05-30 MED ORDER — NITROGLYCERIN 0.4 MG SL SUBL: 0.4000 mg | SUBLINGUAL_TABLET | SUBLINGUAL | 1 refills | Status: AC | PRN

## 2021-05-30 NOTE — Progress Notes (Signed)
D/C instructions printed and placed in packet at nurse's station.

## 2021-05-30 NOTE — TOC Transition Note (Signed)
Transition of Care [facility]) - CM/SW Discharge Note   Patient Details  Name: [name] MRN: [id number] Date of Birth: [birth year]  [facility]) CM/SW Contact:  Zenon Mayo, RN Phone Number: 05/30/2021, 9:44 AM   Clinical Narrative:    Patient for dc home with [facility] today via ptar.  Son, [name] would like to get a call once ptar has arrived to transport patient.     Final next level of care: Home w Hospice Care Barriers to Discharge: No Barriers Identified   Patient Goals and CMS Choice Patient states their goals for this hospitalization and ongoing recovery are:: return home with hospic      Discharge Placement                       Discharge Plan and Services   Discharge Planning Services: CM Consult            DME Arranged:  (DME will be supplied by [facility])         HH Arranged: RN [facility] Agency: [place] Date [facility] Agency Contacted: 05/29/21 Time HH Agency Contacted: 53 Representative spoke with at [place]: [place] ([place]) Interventions     Readmission Risk Interventions Readmission Risk Prevention Plan 05/30/2021  Transportation Screening Complete  PCP or Specialist Appt within 3-5 Days Complete  HRI or Home Care Consult Complete  Social Work Consult for [place] Planning/Counseling Complete  Palliative Care Screening Complete  Medication Review [employer]) Complete  Some recent data might be hidden

## 2021-05-30 NOTE — Plan of Care (Signed)
  Problem: Clinical Measurements: Goal: Will remain free from infection Outcome: Progressing Goal: Cardiovascular complication will be avoided Outcome: Progressing   Problem: Nutrition: Goal: Adequate nutrition will be maintained Outcome: Progressing   Problem: Coping: Goal: Level of anxiety will decrease Outcome: Progressing

## 2021-05-30 NOTE — TOC Progression Note (Signed)
Transition of Care [facility]) - Progression Note    Patient Details  Name: RUBERT RISEN MRN: [id number] Date of Birth: July 07, 1935  Transition of Care [facility]) CM/SW Contact  Zenon Mayo, RN Phone Number: 05/30/2021, 9:34 AM  Clinical Narrative:    DME has been delivered, MD notified to do dc. NCM spoke with son [name], also confirmed address for ptar transport.        Expected Discharge Plan and Services                                                 Social Determinants of Health (SDOH) Interventions    Readmission Risk Interventions No flowsheet data found.

## 2021-05-30 NOTE — Plan of Care (Signed)

## 2021-05-30 NOTE — TOC Progression Note (Signed)
Transition of Care [facility]) - Progression Note    Patient Details  Name: [name] MRN: [id number] Date of Birth: [birth year]  Transition of Care [facility]) CM/SW Contact  Zenon Mayo, RN Phone Number: 05/30/2021, 8:29 AM  Clinical Narrative:    NCM contacted Cassandra with [facility] to check on DME, she will have to check and call this NCM back.  NCM called the son [name] , left vm for return call.        Expected Discharge Plan and Services                                                 Social Determinants of Health (SDOH) Interventions    Readmission Risk Interventions No flowsheet data found.

## 2021-05-30 NOTE — TOC Initial Note (Signed)
Transition of Care [facility]) - Initial/Assessment Note    Patient Details  Name: [name] MRN: [id number] Date of Birth: 04/17/1935  Transition of Care [facility]) CM/SW Contact:    Zenon Mayo, RN Phone Number: 05/30/2021, 9:42 AM  Clinical Narrative:                 Patient was transferred to [facility] and choice for hospice of [facility] was already chosen by Sherryll Burger.  This NCM confirmed with son he wants [facility].  Patient is for dc today. [facility] will be supplying home oxygen and other DME for patient.  Per [place] the DME is at the home.  NCM notified MD DME is at the home. Will call ptar once discharge is in.  Expected Discharge Plan: Home w Hospice Care Barriers to Discharge: No Barriers Identified   Patient Goals and CMS Choice Patient states their goals for this hospitalization and ongoing recovery are:: retur home with hospice      Expected Discharge Plan and Services Expected Discharge Plan: Home w Hospice Care   Discharge Planning Services: CM Consult   Living arrangements for the past 2 months: Single Family Home Expected Discharge Date: 05/30/21               DME Arranged:  (DME will be supplied by [facility])         HH Arranged: RN [facility] Agency: [facility] Date [facility] Agency Contacted: 05/29/21 Time HH Agency Contacted: 65 Representative spoke with at [place]: [place]  Prior Living Arrangements/Services Living arrangements for the past 2 months: [place] with:: Relatives   Do you feel safe going back to the place where you live?: Yes               Activities of Daily Living      Permission Sought/Granted                  Emotional Assessment         Alcohol / Substance Use: Not Applicable Psych Involvement: No (comment)  Admission diagnosis:  Acute exacerbation of CHF (congestive heart failure) ([place]) [I50.9] Patient Active Problem List   Diagnosis Date Noted    PVC (premature ventricular contraction) 05/28/2021   Bladder obstruction 05/28/2021   Anemia of chronic disease 05/28/2021   Bradycardic cardiac arrest ([place])    Elevated troponin    DNR (do not resuscitate) 11/27/2019   Acute pulmonary edema ([place]) 11/26/2019   SOB (shortness of breath) 11/26/2019   Acute exacerbation of CHF (congestive heart failure) ([place]) 11/25/2019   Pressure injury of skin 07/23/2018   Stage 3 chronic kidney disease ([place])    Coronary artery disease involving coronary bypass graft of native heart without angina pectoris    Poorly controlled type 2 diabetes mellitus (HCC)    Congestive heart failure (HCC)    Postoperative pain    Hyperkalemia    Acute blood loss anemia    Below knee amputation status, left 07/16/2018   Cellulitis of left foot    Severe protein-calorie malnutrition (HCC)    Uncontrolled type 2 diabetes mellitus with polyneuropathy (HCC)    PAD (peripheral artery disease) (HCC)    Preoperative cardiovascular examination    S/P CABG (coronary artery bypass graft)    Atherosclerosis of native arteries of extremities with gangrene, left leg ([place]) 06/07/2018   Atherosclerosis of native arteries of the extremities with ulceration ([place]) 05/26/2018   Acute respiratory  failure with hypoxia ([place])    Hypothyroidism 01/08/2018   Nausea and vomiting 12/23/2017   DM type 2 causing vascular disease ([place]) 05/07/2016   Essential hypertension, benign 05/07/2016   Other specified hypothyroidism 05/07/2016   Bilateral carotid bruits 03/11/2016   Acute on chronic diastolic CHF (congestive heart failure) ([place])    Sepsis ([place]) 02/22/2016   Type 2 diabetes mellitus with complication, with long-term current use of insulin ([place]) 02/22/2016   Demand ischemia ([place]) 02/22/2016   UTI (urinary tract infection) 02/22/2016   Acute kidney injury superimposed on chronic kidney disease ([place])    Pyrexia    Arterial hypotension    Hypoxia    OBESITY, UNSPECIFIED 08/24/2009    Hyperlipidemia 08/15/2009   Essential hypertension 08/15/2009   CAD (coronary artery disease) 08/15/2009   Atrial fibrillation ([place]) 08/15/2009   PCP:  [name], No Pharmacy:   [facility], [place] [address] [place] [address] Phone: [phone] Fax: [phone]     Social Determinants of Health (SDOH) Interventions    Readmission Risk Interventions Readmission Risk Prevention Plan 05/30/2021  Transportation Screening Complete  PCP or Specialist Appt within 3-5 Days Complete  HRI or [place] Complete  Social Work Consult for [place] Planning/Counseling Complete  Palliative Care Screening Complete  Medication Review Press photographer) Complete  Some recent data might be hidden

## 2021-06-14 DEATH — deceased

## 2021-06-22 MED FILL — Medication: Qty: 1 | Status: AC
